# Patient Record
Sex: Male | Born: 1950 | Race: White | Hispanic: No | Marital: Married | Smoking: Never smoker
Health system: Southern US, Community
[De-identification: ages and names within clinical notes are randomized; demographics above are authoritative.]

## PROBLEM LIST (undated history)

## (undated) DIAGNOSIS — L570 Actinic keratosis: Secondary | ICD-10-CM

## (undated) DIAGNOSIS — H269 Unspecified cataract: Secondary | ICD-10-CM

## (undated) DIAGNOSIS — E785 Hyperlipidemia, unspecified: Secondary | ICD-10-CM

## (undated) DIAGNOSIS — F32A Depression, unspecified: Secondary | ICD-10-CM

## (undated) DIAGNOSIS — F329 Major depressive disorder, single episode, unspecified: Secondary | ICD-10-CM

## (undated) DIAGNOSIS — M4690 Unspecified inflammatory spondylopathy, site unspecified: Secondary | ICD-10-CM

## (undated) DIAGNOSIS — M4800 Spinal stenosis, site unspecified: Secondary | ICD-10-CM

## (undated) HISTORY — PX: KNEE SURGERY: SHX244

## (undated) HISTORY — DX: Unspecified inflammatory spondylopathy, site unspecified: M46.90

## (undated) HISTORY — PX: HERNIA REPAIR: SHX51

## (undated) HISTORY — DX: Depression, unspecified: F32.A

## (undated) HISTORY — DX: Actinic keratosis: L57.0

## (undated) HISTORY — DX: Major depressive disorder, single episode, unspecified: F32.9

## (undated) HISTORY — DX: Spinal stenosis, site unspecified: M48.00

## (undated) HISTORY — DX: Hyperlipidemia, unspecified: E78.5

## (undated) HISTORY — DX: Unspecified cataract: H26.9

---
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
## 2000-08-14 ENCOUNTER — Ambulatory Visit (HOSPITAL_COMMUNITY): Admission: RE | Admit: 2000-08-14 | Discharge: 2000-08-14 | Payer: Self-pay | Admitting: Family Medicine

## 2000-08-14 ENCOUNTER — Encounter: Payer: Self-pay | Admitting: Family Medicine

## 2000-10-04 ENCOUNTER — Encounter: Payer: Self-pay | Admitting: Emergency Medicine

## 2000-10-04 ENCOUNTER — Inpatient Hospital Stay (HOSPITAL_COMMUNITY): Admission: EM | Admit: 2000-10-04 | Discharge: 2000-10-06 | Payer: Self-pay | Admitting: Emergency Medicine

## 2000-10-04 ENCOUNTER — Encounter: Payer: Self-pay | Admitting: Internal Medicine

## 2000-10-05 ENCOUNTER — Encounter: Payer: Self-pay | Admitting: Thoracic Surgery (Cardiothoracic Vascular Surgery)

## 2000-10-06 ENCOUNTER — Encounter: Payer: Self-pay | Admitting: Thoracic Surgery (Cardiothoracic Vascular Surgery)

## 2000-10-15 ENCOUNTER — Encounter: Payer: Self-pay | Admitting: Thoracic Surgery (Cardiothoracic Vascular Surgery)

## 2000-10-15 ENCOUNTER — Encounter
Admission: RE | Admit: 2000-10-15 | Discharge: 2000-10-15 | Payer: Self-pay | Admitting: Thoracic Surgery (Cardiothoracic Vascular Surgery)

## 2006-04-24 ENCOUNTER — Ambulatory Visit (HOSPITAL_COMMUNITY): Admission: RE | Admit: 2006-04-24 | Discharge: 2006-04-24 | Payer: Self-pay | Admitting: Gastroenterology

## 2010-02-08 ENCOUNTER — Encounter: Admission: RE | Admit: 2010-02-08 | Discharge: 2010-02-08 | Payer: Self-pay | Admitting: Family Medicine

## 2011-04-05 NOTE — Op Note (Signed)
Paul [name], Paul [name]              ACCOUNT NO.:  [id number]   MEDICAL RECORD NO.:  [id number]          PATIENT TYPE:  AMB   LOCATION:  ENDO                         FACILITY:  [facility]   PHYSICIAN:  John C. Madilyn Fireman, M.D.    DATE OF BIRTH:  Jun 22, 1951   DATE OF PROCEDURE:  04/24/2006  DATE OF DISCHARGE:                                 OPERATIVE REPORT   INDICATIONS FOR PROCEDURE:  Average risk colon cancer screening.   PROCEDURE:  The patient was placed in the left lateral decubitus position  and placed on the pulse monitor with continuous low-flow oxygen delivered by  nasal cannula.  He was sedated with 75 mcg IV fentanyl and 6 mg IV Versed.  Olympus video colonoscope was inserted into the rectum and advanced to  cecum, confirmed by transillumination of McBurney's point and visualization  of ileocecal valve and appendiceal orifice.  The prep was good.  The cecum,  ascending, and transverse colon all appeared normal with no masses, polyps,  diverticula or other mucosal abnormalities.  In the descending sigmoid colon  there was seen several scattered diverticula.  No other abnormalities.  The  rectum likewise appeared normal.  Retroflexed view of the anus revealed no  obvious internal hemorrhoids.  The scope was then withdrawn and the patient  returned to the recovery room in stable condition.  He tolerated the  procedure well and there were no immediate complications.   IMPRESSION:  Diverticulosis, otherwise normal study.   PLAN:  Repeat colonoscopy in 10 years and consider annual Hemoccults  beginning in 5 years.           ______________________________  Everardo All Madilyn Fireman, M.D.     J[facility]/MEDQ  D:  04/24/2006  T:  04/24/2006  Job:  [id number]   cc:   Holley Bouche, M.D.  Fax: [phone]
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
## 2011-07-12 ENCOUNTER — Other Ambulatory Visit: Payer: Self-pay | Admitting: Family Medicine

## 2011-07-12 DIAGNOSIS — R251 Tremor, unspecified: Secondary | ICD-10-CM

## 2011-07-12 DIAGNOSIS — M542 Cervicalgia: Secondary | ICD-10-CM

## 2011-07-12 DIAGNOSIS — R413 Other amnesia: Secondary | ICD-10-CM

## 2011-07-17 ENCOUNTER — Ambulatory Visit
Admission: RE | Admit: 2011-07-17 | Discharge: 2011-07-17 | Disposition: A | Discharge status: Home / Self Care | Payer: 59 | Source: Ambulatory Visit | Attending: Family Medicine | Admitting: Family Medicine

## 2011-07-17 DIAGNOSIS — M542 Cervicalgia: Secondary | ICD-10-CM

## 2011-07-17 DIAGNOSIS — R413 Other amnesia: Secondary | ICD-10-CM

## 2011-07-17 DIAGNOSIS — R251 Tremor, unspecified: Secondary | ICD-10-CM

## 2012-07-01 ENCOUNTER — Encounter (INDEPENDENT_AMBULATORY_CARE_PROVIDER_SITE_OTHER): Payer: Self-pay | Admitting: Surgery

## 2012-07-21 ENCOUNTER — Ambulatory Visit (INDEPENDENT_AMBULATORY_CARE_PROVIDER_SITE_OTHER): Payer: 59 | Admitting: Surgery

## 2012-07-21 ENCOUNTER — Encounter (INDEPENDENT_AMBULATORY_CARE_PROVIDER_SITE_OTHER): Payer: Self-pay | Admitting: Surgery

## 2012-07-21 VITALS — BP 134/82 | HR 60 | Temp 98.0°F | Ht 70.5 in | Wt 179.8 lb

## 2012-07-21 DIAGNOSIS — K429 Umbilical hernia without obstruction or gangrene: Secondary | ICD-10-CM

## 2012-07-21 NOTE — Progress Notes (Signed)
Patient ID: Paul [name], male   DOB: 1951-10-23, 61 y.o.   MRN: [id number]  Chief Complaint  Patient presents with  . Pre-op Exam    eval umb hernia    HPI Paul [name] is a 61 y.o. male.  Referred by Dr. Deatra James for evaluation of umbilical hernia HPI 61 year old male in good health who presents with a six-week history of a palpable bulge at his umbilicus. He first noticed this after lifting some heavy rocks in his garden. The mass has enlarged slightly. It causes minimal discomfort. He remains reducible. He presents now for evaluation for repair.   Past Medical History  Diagnosis Date  . Depression   . Hyperlipidemia   . Spinal stenosis   . Spondylitis     Past Surgical History  Procedure Date  . Appendectomy 1962  . Tonsillectomy 1968  . Knee surgery 1980 & 1982    left    Family History  Problem Relation Age of Onset  . Stroke Mother   . Heart disease Father   . Cancer Sister     bone    Social History History  Substance Use Topics  . Smoking status: Never Smoker   . Smokeless tobacco: Not on file  . Alcohol Use: Yes    Allergies  Allergen Reactions  . Lipitor (Atorvastatin)     Current Outpatient Prescriptions  Medication Sig Dispense Refill  . fish oil-omega-3 fatty acids 1000 MG capsule Take 2 g by mouth daily.      [name] Kitchen MELATONIN ER PO Take by mouth.      . Multiple Vitamins-Minerals (MULTIVITAMIN WITH MINERALS) tablet Take 1 tablet by mouth daily.      . sildenafil (VIAGRA) 100 MG tablet Take 100 mg by mouth daily as needed.      . Testosterone (ANDROGEL TD) Place onto the skin.      [name] Kitchen aspirin (ASPIRIN EC) 81 MG EC tablet Take 81 mg by mouth daily. Swallow whole.        Review of Systems Review of Systems  Constitutional: Negative for fever, chills and unexpected weight change.  HENT: Negative for hearing loss, congestion, sore throat, trouble swallowing and voice change.   Eyes: Negative for visual disturbance.  Respiratory: Negative  for cough and wheezing.   Cardiovascular: Negative for chest pain, palpitations and leg swelling.  Gastrointestinal: Negative for nausea, vomiting, abdominal pain, diarrhea, constipation, blood in stool, abdominal distention, anal bleeding and rectal pain.  Genitourinary: Negative for hematuria and difficulty urinating.  Musculoskeletal: Negative for arthralgias.  Skin: Negative for rash and wound.  Neurological: Negative for seizures, syncope, weakness and headaches.  Hematological: Negative for adenopathy. Does not bruise/bleed easily.  Psychiatric/Behavioral: Negative for confusion.    Blood pressure 134/82, pulse 60, temperature 98 F (36.7 C), temperature source Temporal, height 5' 10.5" (1.791 m), weight 179 lb 12.8 oz (81.557 kg), SpO2 97.00%.  Physical Exam Physical Exam WDWN in NAD HEENT:  EOMI, sclera anicteric Neck:  No masses, no thyromegaly Lungs:  CTA bilaterally; normal respiratory effort CV:  Regular rate and rhythm; no murmurs Abd:  +bowel sounds, soft, non-tender, protruding umbilicus - reducible; 2 cm defect Ext:  Well-perfused; no edema Skin:  Warm, dry; no sign of jaundice  Data Reviewed none  Assessment    Umbilical hernia    Plan    Umbilical hernia repair with mesh.  The surgical procedure has been discussed with the patient.  Potential risks, benefits, alternative treatments, and expected outcomes have been  explained.  All of the patient's questions at this time have been answered.  The likelihood of reaching the patient's treatment goal is good.  The patient understand the proposed surgical procedure and wishes to proceed.        Sophonie Goforth K. 07/21/2012, 2:59 PM

## 2012-09-30 ENCOUNTER — Encounter (HOSPITAL_COMMUNITY): Payer: Self-pay | Admitting: Pharmacist

## 2012-10-02 ENCOUNTER — Encounter (HOSPITAL_COMMUNITY)
Admission: RE | Admit: 2012-10-02 | Discharge: 2012-10-02 | Disposition: A | Discharge status: Home / Self Care | Payer: 59 | Source: Ambulatory Visit | Attending: Surgery | Admitting: Surgery

## 2012-10-02 ENCOUNTER — Encounter (HOSPITAL_COMMUNITY): Payer: Self-pay

## 2012-10-02 LAB — CBC
HCT: 47.6 % (ref 39.0–52.0)
MCV: 91.4 fL (ref 78.0–100.0)
Platelets: 243 10*3/uL (ref 150–400)
RBC: 5.21 MIL/uL (ref 4.22–5.81)
WBC: 5.3 10*3/uL (ref 4.0–10.5)

## 2012-10-02 MED ORDER — CHLORHEXIDINE GLUCONATE 4 % EX LIQD: 1.0000 "application " | Freq: Once | CUTANEOUS | Status: DC

## 2012-10-02 NOTE — Pre-Procedure Instructions (Signed)
PREOP CBC WAS DONE TODAY - AT [facility] AS PER ANESTHESIOLOGIST'S GUIDELINES. 

## 2012-10-02 NOTE — Patient Instructions (Signed)
YOUR SURGERY IS SCHEDULED AT [facility]  ON:  Tuesday  11/19  AT 7:30 AM  REPORT TO [facility] AT:  5:30 AM      PHONE # FOR SHORT STAY IS [phone]  DO NOT EAT OR DRINK ANYTHING AFTER MIDNIGHT THE NIGHT BEFORE YOUR SURGERY.  YOU MAY BRUSH YOUR TEETH, RINSE OUT YOUR MOUTH--BUT NO WATER, NO FOOD, NO CHEWING GUM, NO MINTS, NO CANDIES, NO CHEWING TOBACCO.  PLEASE TAKE THE FOLLOWING MEDICATIONS THE AM OF YOUR SURGERY WITH A FEW SIPS OF WATER:  NO MEDS TO TAKE   IF YOU USE INHALERS--USE YOUR INHALERS THE AM OF YOUR SURGERY AND BRING INHALERS TO THE HOSPITAL -TAKE TO SURGERY.    IF YOU ARE DIABETIC:  DO NOT TAKE ANY DIABETIC MEDICATIONS THE AM OF YOUR SURGERY.  IF YOU TAKE INSULIN IN THE EVENINGS--PLEASE ONLY TAKE 1/2 NORMAL EVENING DOSE THE NIGHT BEFORE YOUR SURGERY.  NO INSULIN THE AM OF YOUR SURGERY.  IF YOU HAVE SLEEP APNEA AND USE CPAP OR BIPAP--PLEASE BRING THE MASK AND THE TUBING.  DO NOT BRING YOUR MACHINE.  DO NOT BRING VALUABLES, MONEY, CREDIT CARDS.  DO NOT WEAR JEWELRY, MAKE-UP, NAIL POLISH AND NO METAL PINS OR CLIPS IN YOUR HAIR. CONTACT LENS, DENTURES / PARTIALS, GLASSES SHOULD NOT BE WORN TO SURGERY AND IN MOST CASES-HEARING AIDS WILL NEED TO BE REMOVED.  BRING YOUR GLASSES CASE, ANY EQUIPMENT NEEDED FOR YOUR CONTACT LENS. FOR PATIENTS ADMITTED TO THE HOSPITAL--CHECK OUT TIME THE DAY OF DISCHARGE IS 11:00 AM.  ALL INPATIENT ROOMS ARE PRIVATE - WITH BATHROOM, TELEPHONE, TELEVISION AND WIFI INTERNET.  IF YOU ARE BEING DISCHARGED THE SAME DAY OF YOUR SURGERY--YOU CAN NOT DRIVE YOURSELF HOME--AND SHOULD NOT GO HOME ALONE BY TAXI OR BUS.  NO DRIVING OR OPERATING MACHINERY FOR 24 HOURS FOLLOWING ANESTHESIA / PAIN MEDICATIONS.  PLEASE MAKE ARRANGEMENTS FOR SOMEONE TO BE WITH YOU AT HOME THE FIRST 24 HOURS AFTER SURGERY. RESPONSIBLE DRIVER'S NAME  WIFE CATHERINE                                               PHONE #  [phone]       PLEASE READ OVER ANY  FACT SHEETS THAT YOU WERE GIVEN: MRSA INFORMATION, BLOOD TRANSFUSION INFORMATION, INCENTIVE SPIROMETER INFORMATION.

## 2012-10-05 NOTE — Anesthesia Preprocedure Evaluation (Addendum)
Anesthesia Evaluation  Patient identified by MRN, date of birth, ID band Patient awake    Reviewed: Allergy & Precautions, H&P , NPO status , Patient's Chart, lab work & pertinent test results  Airway Mallampati: II TM Distance: >3 FB Neck ROM: Full    Dental No notable dental hx. (+) Teeth Intact and Dental Advisory Given   Pulmonary neg pulmonary ROS,  breath sounds clear to auscultation  Pulmonary exam normal       Cardiovascular Exercise Tolerance: Good negative cardio ROS  Rhythm:Regular Rate:Normal     Neuro/Psych Depression Spinal stenosis negative neurological ROS  negative psych ROS   GI/Hepatic negative GI ROS, Neg liver ROS,   Endo/Other  negative endocrine ROS  Renal/GU negative Renal ROS  negative genitourinary   Musculoskeletal negative musculoskeletal ROS (+)   Abdominal   Peds  Hematology negative hematology ROS (+)   Anesthesia Other Findings   Reproductive/Obstetrics negative OB ROS                          Anesthesia Physical Anesthesia Plan  ASA: I  Anesthesia Plan: General   Post-op Pain Management:    Induction: Intravenous  Airway Management Planned: LMA  Additional Equipment:   Intra-op Plan:   Post-operative Plan: Extubation in OR  Informed Consent: I have reviewed the patients History and Physical, chart, labs and discussed the procedure including the risks, benefits and alternatives for the proposed anesthesia with the patient or authorized representative who has indicated his/her understanding and acceptance.   Dental advisory given  Plan Discussed with: CRNA and Surgeon  Anesthesia Plan Comments:        Anesthesia Quick Evaluation

## 2012-10-06 ENCOUNTER — Encounter (HOSPITAL_COMMUNITY): Payer: Self-pay | Admitting: *Deleted

## 2012-10-06 ENCOUNTER — Encounter (HOSPITAL_COMMUNITY): Payer: Self-pay | Admitting: Anesthesiology

## 2012-10-06 ENCOUNTER — Ambulatory Visit (HOSPITAL_COMMUNITY): Payer: 59 | Admitting: Anesthesiology

## 2012-10-06 ENCOUNTER — Encounter (HOSPITAL_COMMUNITY)
Admission: RE | Disposition: A | Discharge status: Home / Self Care | Payer: Self-pay | Source: Ambulatory Visit | Attending: Surgery

## 2012-10-06 ENCOUNTER — Ambulatory Visit (HOSPITAL_COMMUNITY)
Admission: RE | Admit: 2012-10-06 | Discharge: 2012-10-06 | Disposition: A | Discharge status: Home / Self Care | Payer: 59 | Source: Ambulatory Visit | Attending: Surgery | Admitting: Surgery

## 2012-10-06 DIAGNOSIS — Z01812 Encounter for preprocedural laboratory examination: Secondary | ICD-10-CM | POA: Insufficient documentation

## 2012-10-06 DIAGNOSIS — Z7982 Long term (current) use of aspirin: Secondary | ICD-10-CM | POA: Insufficient documentation

## 2012-10-06 DIAGNOSIS — K429 Umbilical hernia without obstruction or gangrene: Secondary | ICD-10-CM

## 2012-10-06 DIAGNOSIS — E785 Hyperlipidemia, unspecified: Secondary | ICD-10-CM | POA: Insufficient documentation

## 2012-10-06 HISTORY — PX: INSERTION OF MESH: SHX5868

## 2012-10-06 HISTORY — PX: UMBILICAL HERNIA REPAIR: SHX196

## 2012-10-06 SURGERY — REPAIR, HERNIA, UMBILICAL, ADULT
Anesthesia: General | Wound class: Clean

## 2012-10-06 MED ORDER — LACTATED RINGERS IV SOLN: INTRAVENOUS | Status: DC

## 2012-10-06 MED ORDER — ACETAMINOPHEN 10 MG/ML IV SOLN
INTRAVENOUS | Status: AC
  Filled 2012-10-06: qty 100

## 2012-10-06 MED ORDER — HYDROMORPHONE HCL PF 1 MG/ML IJ SOLN: 0.2500 mg | INTRAMUSCULAR | Status: DC | PRN

## 2012-10-06 MED ORDER — FENTANYL CITRATE 0.05 MG/ML IJ SOLN
INTRAMUSCULAR | Status: AC
  Filled 2012-10-06: qty 2

## 2012-10-06 MED ORDER — PROPOFOL 10 MG/ML IV BOLUS
INTRAVENOUS | Status: DC | PRN
  Administered 2012-10-06: 200 mg via INTRAVENOUS

## 2012-10-06 MED ORDER — BUPIVACAINE-EPINEPHRINE PF 0.25-1:200000 % IJ SOLN
INTRAMUSCULAR | Status: AC
  Filled 2012-10-06: qty 30

## 2012-10-06 MED ORDER — FENTANYL CITRATE 0.05 MG/ML IJ SOLN
25.0000 ug | INTRAMUSCULAR | Status: DC | PRN
  Administered 2012-10-06 (×2): 50 ug via INTRAVENOUS

## 2012-10-06 MED ORDER — ACETAMINOPHEN 10 MG/ML IV SOLN
INTRAVENOUS | Status: DC | PRN
  Administered 2012-10-06: 1000 mg via INTRAVENOUS

## 2012-10-06 MED ORDER — PROMETHAZINE HCL 25 MG/ML IJ SOLN: 6.2500 mg | INTRAMUSCULAR | Status: DC | PRN

## 2012-10-06 MED ORDER — CEFAZOLIN SODIUM-DEXTROSE 2-3 GM-% IV SOLR
2.0000 g | INTRAVENOUS | Status: AC
  Administered 2012-10-06: 2 g via INTRAVENOUS

## 2012-10-06 MED ORDER — ONDANSETRON HCL 4 MG/2ML IJ SOLN: 4.0000 mg | INTRAMUSCULAR | Status: DC | PRN

## 2012-10-06 MED ORDER — LIDOCAINE HCL (CARDIAC) 20 MG/ML IV SOLN
INTRAVENOUS | Status: DC | PRN
  Administered 2012-10-06: 75 mg via INTRAVENOUS

## 2012-10-06 MED ORDER — ONDANSETRON HCL 4 MG/2ML IJ SOLN
INTRAMUSCULAR | Status: DC | PRN
  Administered 2012-10-06: 4 mg via INTRAVENOUS

## 2012-10-06 MED ORDER — LACTATED RINGERS IV SOLN
INTRAVENOUS | Status: DC | PRN
  Administered 2012-10-06: 07:00:00 via INTRAVENOUS

## 2012-10-06 MED ORDER — BUPIVACAINE-EPINEPHRINE 0.25% -1:200000 IJ SOLN
INTRAMUSCULAR | Status: DC | PRN
  Administered 2012-10-06: 20 mL

## 2012-10-06 MED ORDER — MORPHINE SULFATE 10 MG/ML IJ SOLN: 2.0000 mg | INTRAMUSCULAR | Status: DC | PRN

## 2012-10-06 MED ORDER — CEFAZOLIN SODIUM-DEXTROSE 2-3 GM-% IV SOLR
INTRAVENOUS | Status: AC
  Filled 2012-10-06: qty 50

## 2012-10-06 MED ORDER — OXYCODONE-ACETAMINOPHEN 5-325 MG PO TABS
1.0000 | ORAL_TABLET | ORAL | Status: DC | PRN
  Administered 2012-10-06: 1 via ORAL
  Filled 2012-10-06: qty 1

## 2012-10-06 MED ORDER — OXYCODONE-ACETAMINOPHEN 5-325 MG PO TABS: 1.0000 | ORAL_TABLET | ORAL | Status: DC | PRN

## 2012-10-06 MED ORDER — FENTANYL CITRATE 0.05 MG/ML IJ SOLN
INTRAMUSCULAR | Status: DC | PRN
  Administered 2012-10-06 (×2): 25 ug via INTRAVENOUS
  Administered 2012-10-06: 100 ug via INTRAVENOUS
  Administered 2012-10-06 (×2): 50 ug via INTRAVENOUS

## 2012-10-06 SURGICAL SUPPLY — 39 items
BENZOIN TINCTURE PRP APPL 2/3 (GAUZE/BANDAGES/DRESSINGS) ×2 IMPLANT
BLADE HEX COATED 2.75 (ELECTRODE) ×2 IMPLANT
BLADE SURG 15 STRL LF DISP TIS (BLADE) ×2 IMPLANT
BLADE SURG 15 STRL SS (BLADE) ×2
CLOTH BEACON ORANGE TIMEOUT ST (SAFETY) ×2 IMPLANT
DECANTER SPIKE VIAL GLASS SM (MISCELLANEOUS) ×2 IMPLANT
DRAPE LAPAROTOMY T 102X78X121 (DRAPES) ×2 IMPLANT
DRAPE UTILITY XL STRL (DRAPES) ×2 IMPLANT
DRSG TEGADERM 4X4.75 (GAUZE/BANDAGES/DRESSINGS) ×2 IMPLANT
ELECT REM PT RETURN 9FT ADLT (ELECTROSURGICAL) ×2
ELECTRODE REM PT RTRN 9FT ADLT (ELECTROSURGICAL) ×1 IMPLANT
GAUZE SPONGE 4X4 16PLY XRAY LF (GAUZE/BANDAGES/DRESSINGS) ×2 IMPLANT
GLOVE BIO SURGEON STRL SZ7 (GLOVE) ×2 IMPLANT
GLOVE BIOGEL PI IND STRL 7.0 (GLOVE) IMPLANT
GLOVE BIOGEL PI IND STRL 7.5 (GLOVE) ×1 IMPLANT
GLOVE BIOGEL PI INDICATOR 7.0 (GLOVE)
GLOVE BIOGEL PI INDICATOR 7.5 (GLOVE) ×1
GOWN STRL NON-REIN LRG LVL3 (GOWN DISPOSABLE) ×2 IMPLANT
GOWN STRL REIN XL XLG (GOWN DISPOSABLE) ×2 IMPLANT
KIT BASIN OR (CUSTOM PROCEDURE TRAY) ×2 IMPLANT
NEEDLE HYPO 22GX1.5 SAFETY (NEEDLE) ×2 IMPLANT
NEEDLE HYPO 25X1 1.5 SAFETY (NEEDLE) IMPLANT
NS IRRIG 1000ML POUR BTL (IV SOLUTION) ×2 IMPLANT
PACK BASIC VI WITH GOWN DISP (CUSTOM PROCEDURE TRAY) ×2 IMPLANT
PATCH VENTRAL SMALL 4.3 (Mesh Specialty) ×2 IMPLANT
PENCIL BUTTON HOLSTER BLD 10FT (ELECTRODE) ×2 IMPLANT
SPONGE GAUZE 4X4 12PLY (GAUZE/BANDAGES/DRESSINGS) IMPLANT
SPONGE LAP 4X18 X RAY DECT (DISPOSABLE) ×2 IMPLANT
STRIP CLOSURE SKIN 1/2X4 (GAUZE/BANDAGES/DRESSINGS) ×2 IMPLANT
SUT MNCRL AB 4-0 PS2 18 (SUTURE) ×2 IMPLANT
SUT NOVA NAB DX-16 0-1 5-0 T12 (SUTURE) ×2 IMPLANT
SUT NOVA NAB GS-21 0 18 T12 DT (SUTURE) ×2 IMPLANT
SUT PROLENE 0 CT 1 CR/8 (SUTURE) IMPLANT
SUT PROLENE 0 CT 2 (SUTURE) IMPLANT
SUT VIC AB 3-0 SH 27 (SUTURE) ×1
SUT VIC AB 3-0 SH 27X BRD (SUTURE) IMPLANT
SUT VIC AB 3-0 SH 27XBRD (SUTURE) ×1 IMPLANT
SYR CONTROL 10ML LL (SYRINGE) ×2 IMPLANT
TOWEL OR 17X26 10 PK STRL BLUE (TOWEL DISPOSABLE) ×2 IMPLANT

## 2012-10-06 NOTE — Transfer of Care (Signed)
Immediate Anesthesia Transfer of Care Note  Patient: Paul [name]  Procedure(s) Performed: Procedure(s) (LRB) with comments: HERNIA REPAIR UMBILICAL ADULT (N/A) INSERTION OF MESH (N/A)  Patient Location: PACU  Anesthesia Type:General  Level of Consciousness: awake, alert  and patient cooperative  Airway & Oxygen Therapy: Patient Spontanous Breathing and Patient connected to face mask oxygen  Post-op Assessment: Report given to PACU RN, Post -op Vital signs reviewed and stable and Patient moving all extremities X 4  Post vital signs: Reviewed and stable  Complications: No apparent anesthesia complications

## 2012-10-06 NOTE — H&P (Signed)
Patient presents with   .  Pre-op Exam       eval umb hernia        HPI Paul [name] is a 61 y.o. male.  Referred by Dr. Deatra James for evaluation of umbilical hernia HPI 61 year old male in good health who presents with a six-week history of a palpable bulge at his umbilicus. He first noticed this after lifting some heavy rocks in his garden. The mass has enlarged slightly. It causes minimal discomfort. He remains reducible. He presents now for evaluation for repair.      Past Medical History   Diagnosis  Date   .  Depression     .  Hyperlipidemia     .  Spinal stenosis     .  Spondylitis           Past Surgical History   Procedure  Date   .  Appendectomy  1962   .  Tonsillectomy  1968   .  Knee surgery  1980 & 1982       left         Family History   Problem  Relation  Age of Onset   .  Stroke  Mother     .  Heart disease  Father     .  Cancer  Sister         bone        Social History History   Substance Use Topics   .  Smoking status:  Never Smoker    .  Smokeless tobacco:  Not on file   .  Alcohol Use:  Yes         Allergies   Allergen  Reactions   .  Lipitor (Atorvastatin)           Current Outpatient Prescriptions   Medication  Sig  Dispense  Refill   .  fish oil-omega-3 fatty acids 1000 MG capsule  Take 2 g by mouth daily.         [name] Kitchen  MELATONIN ER PO  Take by mouth.         .  Multiple Vitamins-Minerals (MULTIVITAMIN WITH MINERALS) tablet  Take 1 tablet by mouth daily.         .  sildenafil (VIAGRA) 100 MG tablet  Take 100 mg by mouth daily as needed.         .  Testosterone (ANDROGEL TD)  Place onto the skin.         [name] Kitchen  aspirin (ASPIRIN EC) 81 MG EC tablet  Take 81 mg by mouth daily. Swallow whole.              Review of Systems Review of Systems  Constitutional: Negative for fever, chills and unexpected weight change.  HENT: Negative for hearing loss, congestion, sore throat, trouble swallowing and voice change.   Eyes:  Negative for visual disturbance.  Respiratory: Negative for cough and wheezing.   Cardiovascular: Negative for chest pain, palpitations and leg swelling.  Gastrointestinal: Negative for nausea, vomiting, abdominal pain, diarrhea, constipation, blood in stool, abdominal distention, anal bleeding and rectal pain.  Genitourinary: Negative for hematuria and difficulty urinating.  Musculoskeletal: Negative for arthralgias.  Skin: Negative for rash and wound.  Neurological: Negative for seizures, syncope, weakness and headaches.  Hematological: Negative for adenopathy. Does not bruise/bleed easily.  Psychiatric/Behavioral: Negative for confusion.      Blood pressure 134/82, pulse 60, temperature 98 F (36.7 C), temperature source Temporal, height  5' 10.5" (1.791 m), weight 179 lb 12.8 oz (81.557 kg), SpO2 97.00%.   Physical Exam Physical Exam WDWN in NAD HEENT:  EOMI, sclera anicteric Neck:  No masses, no thyromegaly Lungs:  CTA bilaterally; normal respiratory effort CV:  Regular rate and rhythm; no murmurs Abd:  +bowel sounds, soft, non-tender, protruding umbilicus - reducible; 2 cm defect Ext:  Well-perfused; no edema Skin:  Warm, dry; no sign of jaundice   Data Reviewed none   Assessment    Umbilical hernia     Plan    Umbilical hernia repair with mesh.  The surgical procedure has been discussed with the patient.  Potential risks, benefits, alternative treatments, and expected outcomes have been explained.  All of the patient's questions at this time have been answered.  The likelihood of reaching the patient's treatment goal is good.  The patient understand the proposed surgical procedure and wishes to proceed.      [name]. Corliss Skains, MD, [facility] Surgery  10/06/2012 7:21 AM

## 2012-10-06 NOTE — Op Note (Signed)
Indications:  The patient presented with a history of a reducible umbilical hernia.  The patient was examined and we recommended umbilical hernia repair with mesh.  Pre-operative diagnosis:  Umbilical hernia  Post-operative diagnosis:  Same  Surgeon: Machel Violante K.   Assistants: none  Anesthesia: General LMA anesthesia  ASA Class: 1   Procedure Details  The patient was seen again in the Holding Room. The risks, benefits, complications, treatment options, and expected outcomes were discussed with the patient. The possibilities of reaction to medication, pulmonary aspiration, perforation of viscus, bleeding, recurrent infection, the need for additional procedures, and development of a complication requiring transfusion or further operation were discussed with the patient and/or family. There was concurrence with the proposed plan, and informed consent was obtained. The site of surgery was properly noted/marked. The patient was taken to the Operating Room, identified as Paul [name], and the procedure verified as umbilical hernia repair. A Time Out was held and the above information confirmed.  After an adequate level of general anesthesia was obtained, the patient's abdomen was prepped with Chloraprep and draped in sterile fashion.  We made a transverse incision above the umbilicus.  Dissection was carried down to the hernia sac with cautery.  We dissected bluntly around the hernia sac down to the edge of the fascial defect.  We reduced the hernia sac back into the pre-peritoneal space.  The fascial defect measured 1.5 cm.  We cleared the fascia in all directions.  A small Proceed ventral patch was inserted into the pre-peritoneal space and was deployed.  The mesh was secured with four trans-fascial sutures of 0 Novofil.  The fascial defect was closed with multiple interrupted figure-of-eight 1 Novofil sutures.  The base of the umbilicus was tacked down with 3-0 Vicryl.  3-0 Vicryl was used to  close the subcutaneous tissues and 4-0 Monocryl was used to close the skin.  Steri-strips and clean dressing were applied.  The patient was extubated and brought to the recovery room in stable condition.  All sponge, instrument, and needle counts were correct prior to closure and at the conclusion of the case.   Estimated Blood Loss: Minimal          Complications: None; patient tolerated the procedure well.         Disposition: PACU - hemodynamically stable.         Condition: stable  [name]. Corliss Skains, MD, [facility] Surgery  10/06/2012 8:25 AM

## 2012-10-06 NOTE — Anesthesia Postprocedure Evaluation (Signed)
  Anesthesia Post-op Note  Patient: Paul [name]  Procedure(s) Performed: Procedure(s) (LRB): HERNIA REPAIR UMBILICAL ADULT (N/A) INSERTION OF MESH (N/A)  Patient Location: PACU  Anesthesia Type: General  Level of Consciousness: awake and alert   Airway and Oxygen Therapy: Patient Spontanous Breathing  Post-op Pain: mild  Post-op Assessment: Post-op Vital signs reviewed, Patient's Cardiovascular Status Stable, Respiratory Function Stable, Patent Airway and No signs of Nausea or vomiting  Post-op Vital Signs: stable  Complications: No apparent anesthesia complications

## 2012-10-07 ENCOUNTER — Encounter (HOSPITAL_COMMUNITY): Payer: Self-pay | Admitting: Surgery

## 2012-10-20 ENCOUNTER — Encounter (INDEPENDENT_AMBULATORY_CARE_PROVIDER_SITE_OTHER): Payer: Self-pay | Admitting: Surgery

## 2012-10-20 ENCOUNTER — Ambulatory Visit (INDEPENDENT_AMBULATORY_CARE_PROVIDER_SITE_OTHER): Payer: Commercial Indemnity | Admitting: Surgery

## 2012-10-20 VITALS — BP 134/68 | HR 60 | Temp 97.3°F | Resp 16 | Ht 70.5 in | Wt 170.6 lb

## 2012-10-20 DIAGNOSIS — K429 Umbilical hernia without obstruction or gangrene: Secondary | ICD-10-CM

## 2012-10-20 NOTE — Progress Notes (Signed)
Status post umbilical hernia repair with mesh on 10/06/12 for a 1.5 cm umbilical hernia. We used a 4.3 cm proceed ventral patch. The patient did quite well. No tenderness. His incision is well-healed no sign of infection. No sign of recurrent hernia. He resumed full activity. Followup as needed.  [name]. Corliss Skains, MD, [facility] Surgery  10/20/2012 10:51 AM

## 2013-12-20 ENCOUNTER — Other Ambulatory Visit: Payer: Self-pay | Admitting: Family Medicine

## 2013-12-20 ENCOUNTER — Ambulatory Visit
Admission: RE | Admit: 2013-12-20 | Discharge: 2013-12-20 | Disposition: A | Discharge status: Home / Self Care | Payer: BC Managed Care – PPO | Source: Ambulatory Visit | Attending: Family Medicine | Admitting: Family Medicine

## 2013-12-20 DIAGNOSIS — J40 Bronchitis, not specified as acute or chronic: Secondary | ICD-10-CM

## 2014-03-16 ENCOUNTER — Ambulatory Visit
Admission: RE | Admit: 2014-03-16 | Discharge: 2014-03-16 | Disposition: A | Discharge status: Home / Self Care | Payer: BC Managed Care – PPO | Source: Ambulatory Visit | Attending: Family Medicine | Admitting: Family Medicine

## 2014-03-16 ENCOUNTER — Other Ambulatory Visit: Payer: Self-pay | Admitting: Family Medicine

## 2014-03-16 DIAGNOSIS — R0789 Other chest pain: Secondary | ICD-10-CM

## 2014-09-23 DIAGNOSIS — M9902 Segmental and somatic dysfunction of thoracic region: Secondary | ICD-10-CM | POA: Diagnosis not present

## 2014-09-23 DIAGNOSIS — S39012A Strain of muscle, fascia and tendon of lower back, initial encounter: Secondary | ICD-10-CM | POA: Diagnosis not present

## 2014-09-23 DIAGNOSIS — M9903 Segmental and somatic dysfunction of lumbar region: Secondary | ICD-10-CM | POA: Diagnosis not present

## 2014-09-23 DIAGNOSIS — S29012A Strain of muscle and tendon of back wall of thorax, initial encounter: Secondary | ICD-10-CM | POA: Diagnosis not present

## 2014-09-23 DIAGNOSIS — M9901 Segmental and somatic dysfunction of cervical region: Secondary | ICD-10-CM | POA: Diagnosis not present

## 2014-09-23 DIAGNOSIS — S138XXA Sprain of joints and ligaments of other parts of neck, initial encounter: Secondary | ICD-10-CM | POA: Diagnosis not present

## 2014-09-30 DIAGNOSIS — I4901 Ventricular fibrillation: Secondary | ICD-10-CM | POA: Diagnosis not present

## 2014-09-30 DIAGNOSIS — E291 Testicular hypofunction: Secondary | ICD-10-CM | POA: Diagnosis not present

## 2014-09-30 DIAGNOSIS — F329 Major depressive disorder, single episode, unspecified: Secondary | ICD-10-CM | POA: Diagnosis not present

## 2014-09-30 DIAGNOSIS — E785 Hyperlipidemia, unspecified: Secondary | ICD-10-CM | POA: Diagnosis not present

## 2014-10-04 ENCOUNTER — Other Ambulatory Visit: Payer: Self-pay | Admitting: *Deleted

## 2014-10-04 DIAGNOSIS — R002 Palpitations: Secondary | ICD-10-CM

## 2014-10-11 ENCOUNTER — Encounter: Payer: Self-pay | Admitting: *Deleted

## 2014-10-11 ENCOUNTER — Encounter (INDEPENDENT_AMBULATORY_CARE_PROVIDER_SITE_OTHER): Payer: Medicare Other

## 2014-10-11 DIAGNOSIS — R002 Palpitations: Secondary | ICD-10-CM

## 2014-10-11 NOTE — Progress Notes (Signed)
Patient ID: Paul [name], male   DOB: 11-Nov-1951, 63 y.o.   MRN: [id number] [employer] verite 30 day cardiac event monitor applied to patient.

## 2014-10-19 ENCOUNTER — Telehealth: Payer: Self-pay | Admitting: *Deleted
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
## 2014-10-21 DIAGNOSIS — M9903 Segmental and somatic dysfunction of lumbar region: Secondary | ICD-10-CM | POA: Diagnosis not present

## 2014-10-21 DIAGNOSIS — S29012A Strain of muscle and tendon of back wall of thorax, initial encounter: Secondary | ICD-10-CM | POA: Diagnosis not present

## 2014-10-21 DIAGNOSIS — S138XXA Sprain of joints and ligaments of other parts of neck, initial encounter: Secondary | ICD-10-CM | POA: Diagnosis not present

## 2014-10-21 DIAGNOSIS — S39012A Strain of muscle, fascia and tendon of lower back, initial encounter: Secondary | ICD-10-CM | POA: Diagnosis not present

## 2014-10-21 DIAGNOSIS — M9902 Segmental and somatic dysfunction of thoracic region: Secondary | ICD-10-CM | POA: Diagnosis not present

## 2014-10-21 DIAGNOSIS — M9901 Segmental and somatic dysfunction of cervical region: Secondary | ICD-10-CM | POA: Diagnosis not present

## 2014-10-24 ENCOUNTER — Telehealth: Payer: Self-pay
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
## 2014-12-02 DIAGNOSIS — M9902 Segmental and somatic dysfunction of thoracic region: Secondary | ICD-10-CM | POA: Diagnosis not present

## 2014-12-02 DIAGNOSIS — M9903 Segmental and somatic dysfunction of lumbar region: Secondary | ICD-10-CM | POA: Diagnosis not present

## 2014-12-02 DIAGNOSIS — S138XXA Sprain of joints and ligaments of other parts of neck, initial encounter: Secondary | ICD-10-CM | POA: Diagnosis not present

## 2014-12-02 DIAGNOSIS — S39012A Strain of muscle, fascia and tendon of lower back, initial encounter: Secondary | ICD-10-CM | POA: Diagnosis not present

## 2014-12-02 DIAGNOSIS — M9901 Segmental and somatic dysfunction of cervical region: Secondary | ICD-10-CM | POA: Diagnosis not present

## 2014-12-02 DIAGNOSIS — S29012A Strain of muscle and tendon of back wall of thorax, initial encounter: Secondary | ICD-10-CM | POA: Diagnosis not present

## 2014-12-14 DIAGNOSIS — M791 Myalgia: Secondary | ICD-10-CM | POA: Diagnosis not present

## 2014-12-14 DIAGNOSIS — R1084 Generalized abdominal pain: Secondary | ICD-10-CM | POA: Diagnosis not present

## 2014-12-14 DIAGNOSIS — E780 Pure hypercholesterolemia: Secondary | ICD-10-CM | POA: Diagnosis not present

## 2014-12-14 DIAGNOSIS — R141 Gas pain: Secondary | ICD-10-CM | POA: Diagnosis not present

## 2014-12-30 DIAGNOSIS — S39012A Strain of muscle, fascia and tendon of lower back, initial encounter: Secondary | ICD-10-CM | POA: Diagnosis not present

## 2014-12-30 DIAGNOSIS — S29012A Strain of muscle and tendon of back wall of thorax, initial encounter: Secondary | ICD-10-CM | POA: Diagnosis not present

## 2014-12-30 DIAGNOSIS — S138XXA Sprain of joints and ligaments of other parts of neck, initial encounter: Secondary | ICD-10-CM | POA: Diagnosis not present

## 2014-12-30 DIAGNOSIS — M9901 Segmental and somatic dysfunction of cervical region: Secondary | ICD-10-CM | POA: Diagnosis not present

## 2014-12-30 DIAGNOSIS — M9902 Segmental and somatic dysfunction of thoracic region: Secondary | ICD-10-CM | POA: Diagnosis not present

## 2014-12-30 DIAGNOSIS — M9903 Segmental and somatic dysfunction of lumbar region: Secondary | ICD-10-CM | POA: Diagnosis not present

## 2015-01-05 DIAGNOSIS — E559 Vitamin D deficiency, unspecified: Secondary | ICD-10-CM | POA: Diagnosis not present

## 2015-01-05 DIAGNOSIS — M791 Myalgia: Secondary | ICD-10-CM | POA: Diagnosis not present

## 2015-01-05 DIAGNOSIS — E291 Testicular hypofunction: Secondary | ICD-10-CM | POA: Diagnosis not present

## 2015-01-05 DIAGNOSIS — E780 Pure hypercholesterolemia: Secondary | ICD-10-CM | POA: Diagnosis not present

## 2015-01-05 DIAGNOSIS — D518 Other vitamin B12 deficiency anemias: Secondary | ICD-10-CM | POA: Diagnosis not present

## 2015-01-05 DIAGNOSIS — E889 Metabolic disorder, unspecified: Secondary | ICD-10-CM | POA: Diagnosis not present

## 2015-01-05 DIAGNOSIS — J42 Unspecified chronic bronchitis: Secondary | ICD-10-CM | POA: Diagnosis not present

## 2015-01-06 ENCOUNTER — Other Ambulatory Visit: Payer: Self-pay | Admitting: Family Medicine

## 2015-01-06 DIAGNOSIS — J42 Unspecified chronic bronchitis: Secondary | ICD-10-CM

## 2015-01-11 ENCOUNTER — Ambulatory Visit
Admission: RE | Admit: 2015-01-11 | Discharge: 2015-01-11 | Disposition: A | Discharge status: Home / Self Care | Payer: Medicare Other | Source: Ambulatory Visit | Attending: Family Medicine | Admitting: Family Medicine

## 2015-01-11 DIAGNOSIS — J439 Emphysema, unspecified: Secondary | ICD-10-CM | POA: Diagnosis not present

## 2015-01-11 DIAGNOSIS — I2510 Atherosclerotic heart disease of native coronary artery without angina pectoris: Secondary | ICD-10-CM | POA: Diagnosis not present

## 2015-01-11 DIAGNOSIS — J42 Unspecified chronic bronchitis: Secondary | ICD-10-CM | POA: Diagnosis not present

## 2015-01-11 MED ORDER — IOHEXOL 300 MG/ML  SOLN
75.0000 mL | Freq: Once | INTRAMUSCULAR | Status: AC | PRN
  Administered 2015-01-11: 75 mL via INTRAVENOUS

## 2015-01-25 DIAGNOSIS — M9902 Segmental and somatic dysfunction of thoracic region: Secondary | ICD-10-CM | POA: Diagnosis not present

## 2015-01-25 DIAGNOSIS — S138XXA Sprain of joints and ligaments of other parts of neck, initial encounter: Secondary | ICD-10-CM | POA: Diagnosis not present

## 2015-01-25 DIAGNOSIS — S29012A Strain of muscle and tendon of back wall of thorax, initial encounter: Secondary | ICD-10-CM | POA: Diagnosis not present

## 2015-01-25 DIAGNOSIS — M9901 Segmental and somatic dysfunction of cervical region: Secondary | ICD-10-CM | POA: Diagnosis not present

## 2015-01-25 DIAGNOSIS — S39012A Strain of muscle, fascia and tendon of lower back, initial encounter: Secondary | ICD-10-CM | POA: Diagnosis not present

## 2015-01-25 DIAGNOSIS — M9903 Segmental and somatic dysfunction of lumbar region: Secondary | ICD-10-CM | POA: Diagnosis not present

## 2015-02-22 DIAGNOSIS — S39012A Strain of muscle, fascia and tendon of lower back, initial encounter: Secondary | ICD-10-CM | POA: Diagnosis not present

## 2015-02-22 DIAGNOSIS — M9901 Segmental and somatic dysfunction of cervical region: Secondary | ICD-10-CM | POA: Diagnosis not present

## 2015-02-22 DIAGNOSIS — M9903 Segmental and somatic dysfunction of lumbar region: Secondary | ICD-10-CM | POA: Diagnosis not present

## 2015-02-22 DIAGNOSIS — S138XXA Sprain of joints and ligaments of other parts of neck, initial encounter: Secondary | ICD-10-CM | POA: Diagnosis not present

## 2015-02-22 DIAGNOSIS — S29012A Strain of muscle and tendon of back wall of thorax, initial encounter: Secondary | ICD-10-CM | POA: Diagnosis not present

## 2015-02-22 DIAGNOSIS — M9902 Segmental and somatic dysfunction of thoracic region: Secondary | ICD-10-CM | POA: Diagnosis not present

## 2015-03-23 DIAGNOSIS — S39012A Strain of muscle, fascia and tendon of lower back, initial encounter: Secondary | ICD-10-CM | POA: Diagnosis not present

## 2015-03-23 DIAGNOSIS — M9901 Segmental and somatic dysfunction of cervical region: Secondary | ICD-10-CM | POA: Diagnosis not present

## 2015-03-23 DIAGNOSIS — M9903 Segmental and somatic dysfunction of lumbar region: Secondary | ICD-10-CM | POA: Diagnosis not present

## 2015-03-23 DIAGNOSIS — S138XXA Sprain of joints and ligaments of other parts of neck, initial encounter: Secondary | ICD-10-CM | POA: Diagnosis not present

## 2015-03-23 DIAGNOSIS — M9902 Segmental and somatic dysfunction of thoracic region: Secondary | ICD-10-CM | POA: Diagnosis not present

## 2015-03-23 DIAGNOSIS — S29012A Strain of muscle and tendon of back wall of thorax, initial encounter: Secondary | ICD-10-CM | POA: Diagnosis not present

## 2015-04-18 DIAGNOSIS — S39012A Strain of muscle, fascia and tendon of lower back, initial encounter: Secondary | ICD-10-CM | POA: Diagnosis not present

## 2015-04-18 DIAGNOSIS — M9903 Segmental and somatic dysfunction of lumbar region: Secondary | ICD-10-CM | POA: Diagnosis not present

## 2015-04-18 DIAGNOSIS — M9902 Segmental and somatic dysfunction of thoracic region: Secondary | ICD-10-CM | POA: Diagnosis not present

## 2015-04-18 DIAGNOSIS — M9901 Segmental and somatic dysfunction of cervical region: Secondary | ICD-10-CM | POA: Diagnosis not present

## 2015-04-18 DIAGNOSIS — S138XXA Sprain of joints and ligaments of other parts of neck, initial encounter: Secondary | ICD-10-CM | POA: Diagnosis not present

## 2015-04-18 DIAGNOSIS — S29012A Strain of muscle and tendon of back wall of thorax, initial encounter: Secondary | ICD-10-CM | POA: Diagnosis not present

## 2015-05-16 DIAGNOSIS — M9901 Segmental and somatic dysfunction of cervical region: Secondary | ICD-10-CM | POA: Diagnosis not present

## 2015-05-16 DIAGNOSIS — M9903 Segmental and somatic dysfunction of lumbar region: Secondary | ICD-10-CM | POA: Diagnosis not present

## 2015-05-16 DIAGNOSIS — S138XXA Sprain of joints and ligaments of other parts of neck, initial encounter: Secondary | ICD-10-CM | POA: Diagnosis not present

## 2015-05-16 DIAGNOSIS — S39012A Strain of muscle, fascia and tendon of lower back, initial encounter: Secondary | ICD-10-CM | POA: Diagnosis not present

## 2015-05-16 DIAGNOSIS — S29012A Strain of muscle and tendon of back wall of thorax, initial encounter: Secondary | ICD-10-CM | POA: Diagnosis not present

## 2015-05-16 DIAGNOSIS — M9902 Segmental and somatic dysfunction of thoracic region: Secondary | ICD-10-CM | POA: Diagnosis not present

## 2015-06-22 DIAGNOSIS — S39012A Strain of muscle, fascia and tendon of lower back, initial encounter: Secondary | ICD-10-CM | POA: Diagnosis not present

## 2015-06-22 DIAGNOSIS — M9901 Segmental and somatic dysfunction of cervical region: Secondary | ICD-10-CM | POA: Diagnosis not present

## 2015-06-22 DIAGNOSIS — M9903 Segmental and somatic dysfunction of lumbar region: Secondary | ICD-10-CM | POA: Diagnosis not present

## 2015-06-22 DIAGNOSIS — M9902 Segmental and somatic dysfunction of thoracic region: Secondary | ICD-10-CM | POA: Diagnosis not present

## 2015-06-22 DIAGNOSIS — S138XXA Sprain of joints and ligaments of other parts of neck, initial encounter: Secondary | ICD-10-CM | POA: Diagnosis not present

## 2015-06-22 DIAGNOSIS — S29012A Strain of muscle and tendon of back wall of thorax, initial encounter: Secondary | ICD-10-CM | POA: Diagnosis not present

## 2015-06-26 DIAGNOSIS — E291 Testicular hypofunction: Secondary | ICD-10-CM | POA: Diagnosis not present

## 2015-06-26 DIAGNOSIS — E349 Endocrine disorder, unspecified: Secondary | ICD-10-CM | POA: Diagnosis not present

## 2015-06-26 DIAGNOSIS — J42 Unspecified chronic bronchitis: Secondary | ICD-10-CM | POA: Diagnosis not present

## 2015-06-26 DIAGNOSIS — D518 Other vitamin B12 deficiency anemias: Secondary | ICD-10-CM | POA: Diagnosis not present

## 2015-06-26 DIAGNOSIS — E889 Metabolic disorder, unspecified: Secondary | ICD-10-CM | POA: Diagnosis not present

## 2015-06-26 DIAGNOSIS — E780 Pure hypercholesterolemia: Secondary | ICD-10-CM | POA: Diagnosis not present

## 2015-06-26 DIAGNOSIS — E756 Lipid storage disorder, unspecified: Secondary | ICD-10-CM | POA: Diagnosis not present

## 2015-06-26 DIAGNOSIS — E7211 Homocystinuria: Secondary | ICD-10-CM | POA: Diagnosis not present

## 2015-06-26 DIAGNOSIS — J449 Chronic obstructive pulmonary disease, unspecified: Secondary | ICD-10-CM | POA: Diagnosis not present

## 2015-06-26 DIAGNOSIS — M791 Myalgia: Secondary | ICD-10-CM | POA: Diagnosis not present

## 2015-06-26 DIAGNOSIS — E7212 Methylenetetrahydrofolate reductase deficiency: Secondary | ICD-10-CM | POA: Diagnosis not present

## 2015-07-07 ENCOUNTER — Ambulatory Visit (INDEPENDENT_AMBULATORY_CARE_PROVIDER_SITE_OTHER): Payer: Medicare Other | Admitting: Sports Medicine

## 2015-07-07 ENCOUNTER — Encounter: Payer: Self-pay | Admitting: Sports Medicine

## 2015-07-07 VITALS — BP 122/58 | HR 55 | Ht 71.0 in | Wt 158.0 lb

## 2015-07-07 DIAGNOSIS — E291 Testicular hypofunction: Secondary | ICD-10-CM | POA: Diagnosis not present

## 2015-07-07 DIAGNOSIS — Z125 Encounter for screening for malignant neoplasm of prostate: Secondary | ICD-10-CM | POA: Diagnosis not present

## 2015-07-07 DIAGNOSIS — E780 Pure hypercholesterolemia: Secondary | ICD-10-CM | POA: Diagnosis not present

## 2015-07-07 DIAGNOSIS — S86812A Strain of other muscle(s) and tendon(s) at lower leg level, left leg, initial encounter: Secondary | ICD-10-CM | POA: Diagnosis present

## 2015-07-07 NOTE — Progress Notes (Signed)
   Subjective:    Patient ID: Paul [name], male    DOB: Nov 29, 1950, 64 y.o.   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 Pain is present primarily with running. He describes a pulling sensation that will sometimes occur in the posterior knee and at other times in his calf. He will modify his running until his symptoms improve but he has been unable to increase his tempo without reinjuring himself. He denies any swelling. Denies bruising. He has tried some kinesio tape which has been helpful. He is hoping to participate in a half Ironman in March. He has no pain with biking or swimming.  Past medical history reviewed Past surgical history reviewed. Significant for 2 prior left knee arthroscopies. Medications reviewed Allergies reviewed    Review of Systems     Objective:   Physical Exam Well-developed, fit appearing. No acute distress. Awake alert and oriented 3  Left calf: There is some slight tenderness to palpation along the lateral head of the gastrocnemius. No palpable defect. No soft tissue swelling. No other bony or soft tissue tenderness to direct palpation.  Fairly well-preserved arch with standing. Excellent running form. Forefoot striker striking slightly in supination. No limp.  Limited ultrasound of the left calf was performed. I do not see any obvious tearing. There is some slight calcification diffusely in the lateral head of the gastroc consistent with chronic injury.       Assessment & Plan:  Left calf pain secondary to chronic calf strain  Green insoles with a 5/16 inch lift to try in his running shoes. Alfredson heel drop exercises to be done daily. Body helix calf compression sleeve. Follow-up in one month. We discussed the possibility of giving him a compression sleeve for his left knee if he finds  the calf sleeve to be helpful. He can continue with activity as tolerated.

## 2015-07-18 ENCOUNTER — Ambulatory Visit (INDEPENDENT_AMBULATORY_CARE_PROVIDER_SITE_OTHER): Payer: Medicare Other | Admitting: Sports Medicine

## 2015-07-18 ENCOUNTER — Encounter: Payer: Self-pay | Admitting: Sports Medicine

## 2015-07-18 VITALS — BP 133/68 | Ht 71.0 in | Wt 158.0 lb

## 2015-07-18 DIAGNOSIS — M79662 Pain in left lower leg: Secondary | ICD-10-CM | POA: Diagnosis present

## 2015-07-18 MED ORDER — NITROGLYCERIN 0.2 MG/HR TD PT24: MEDICATED_PATCH | TRANSDERMAL | Status: DC

## 2015-07-18 NOTE — Progress Notes (Signed)
Paul [name] - 64 y.o. male MRN [id number]  Date of birth: [birth year]   Paul [name] is a 64 y.o. male who presents today for left posterior calf pain. He was given compression sleeve, green insoles, Alfredson's exercises, and heel wedge added upon last visit.   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  He was training last Thursday with 400 m runs and felt discomfort in his posterior lower leg.  He was doing running high knees (skips) on Sunday and has severe pain. He denies any popping or traumatic event. He was limping for the rest of Sunday. He hasn't done anything for the past two days.  Pain is described as tightening up like a cramp. Felt better when he was walking on his heel.  He had pain with his knee bent performing the Alfredson's exercises. He locates the pain while pointing to the lateral head of his gastrocnemius but reports that the pain feels deep.   PMHx -  reviewed.   PSHx - reviewed.  Contributory factors include:  2 left knee arthroscopies.  FHx -  reviewed. Medications - niacin, sildenafil,    ROS Per HPI   Exam:  Filed Vitals:   07/18/15 1155  BP: 133/68   Gen: NAD Cardiorespiratory - Normal respiratory effort/rate.   Posterior Lower leg Exam:  Laterality: left Appearance: no erythema or ecchymosis  Edema: none  Tenderness: TTP along the lateral aspect of the lateral head of the gastrocnemius at the mid belly  Range of Motion: normal active and passive  Strength:  Quadricep: 5/5 Hamstring: 5/5 Plantarflexion: 5/5 Dorsiflexion: 5/5 Gait: normal Neurovascularly intact   Right foot dominant  Well preserved arch with standing  Right calf: 35.5 cm Left calf: 36.5 Left leg: 88 cm  Right Leg: 89 cm   Imaging:  Limited US:  Left leg: calf  Lateral and medial gastrocnemius appear intact with no effusion or tear. There was some effusion deep but not exactly correlating to the area of significant pain upon palpation.  Achilles with normal thickness and no effusion. Achilles with normal findings upon insertion into calcaneous. Findings are consistent with normal appearing gastrocnemius   Pain of left calf History suggestive for a soleus strain.  Ultrasound not indicating a tear or effusion.  - NTG protocol initiated. He was counseled on not taking sildenafil and NTG together  - withold from running and Alfredson's exercises for now. He can continue to bike and swim   - he will f/u with Dr. Micheline Chapman at a time with Dr. Oneida Alar in clinic in order to ultrasound    Patient seen and evaluated with the resident. I agree with the plan of care. This is somewhat of a challenging case. Physical exam and history suggest a potential soleus muscle strain but he does not have any palpable defect or soft tissue swelling today. Ultrasound evaluation did not show any obvious abnormality either. I've asked that he refrain from doing his Alfredson heel drop exercises as it sounds like these may have exacerbated his symptoms (especially with the knee bent). I want him to continue with his heel lifts and continue with his compression sleeve. I would also like to start the patient on a quarter patch of nitroglycerin. Although I do not see a discrete tear on ultrasound I would like to try some topical nitroglycerin. He is counseled about not using the patch with his sildenafil. Begin to resume activity as tolerated and follow-up with  Dr. Oneida Alar on September 13 for his input.

## 2015-07-18 NOTE — Assessment & Plan Note (Signed)
History suggestive for a soleus strain.  Ultrasound not indicating a tear or effusion.  - NTG protocol initiated. He was counseled on not taking sildenafil and NTG together  - withold from running and Alfredson's exercises for now. He can continue to bike and swim   - he will f/u with Dr. Micheline Chapman at a time with Dr. Oneida Alar in clinic in order to ultrasound

## 2015-07-18 NOTE — Patient Instructions (Signed)

## 2015-07-20 DIAGNOSIS — M9903 Segmental and somatic dysfunction of lumbar region: Secondary | ICD-10-CM | POA: Diagnosis not present

## 2015-07-20 DIAGNOSIS — S138XXA Sprain of joints and ligaments of other parts of neck, initial encounter: Secondary | ICD-10-CM | POA: Diagnosis not present

## 2015-07-20 DIAGNOSIS — M9902 Segmental and somatic dysfunction of thoracic region: Secondary | ICD-10-CM | POA: Diagnosis not present

## 2015-07-20 DIAGNOSIS — M9901 Segmental and somatic dysfunction of cervical region: Secondary | ICD-10-CM | POA: Diagnosis not present

## 2015-07-20 DIAGNOSIS — S29012A Strain of muscle and tendon of back wall of thorax, initial encounter: Secondary | ICD-10-CM | POA: Diagnosis not present

## 2015-07-20 DIAGNOSIS — S39012A Strain of muscle, fascia and tendon of lower back, initial encounter: Secondary | ICD-10-CM | POA: Diagnosis not present

## 2015-08-01 ENCOUNTER — Encounter: Payer: Self-pay | Admitting: Sports Medicine

## 2015-08-01 ENCOUNTER — Ambulatory Visit (INDEPENDENT_AMBULATORY_CARE_PROVIDER_SITE_OTHER): Payer: Medicare Other | Admitting: Sports Medicine

## 2015-08-01 VITALS — BP 149/75 | HR 56 | Ht 71.0 in | Wt 158.0 lb

## 2015-08-01 DIAGNOSIS — M79662 Pain in left lower leg: Secondary | ICD-10-CM | POA: Diagnosis present

## 2015-08-01 NOTE — Progress Notes (Signed)
Patient ID: SKYLER CAREL, male   DOB: [birth year], 64 y.o.   MRN: [id number]  HPI: Mr. Tae Vonada is a 64 year old male presenting for follow-up for suspected soleus strain (last seen 07/18/15). He is an avid cyclist and frequent runner.  In the interim he has attempted walking with calf compression sleeve during which he experienced sharp, intense lateral calf pain that felt like a cramp, of which eventually resolved over the next few days. He experienced a similar pain with a later return to running at slower pace, experiencing pain around 2.5 miles.  Pickle juice did not resolve pain.  Ran/walked 5 miles this morning with similar pain pattern.  Experiences pain with soleus-isolated Alfredson's exercises.  Does not have pain with walking.  Using NG patch since last visit with little improvement, also using compression sleeve.  Denies weakness, paraesthesias, swelling.  Still unsure of inciting injury or activity.  Filed Vitals:   08/01/15 1341  BP: 149/75  Pulse: 56   Physical exam: General: Fit male in 60s sitting comfortably on exam table in no apparent distress.  Lower extremities: No swelling or ecchymoses. NG patch present over mid-inferior gastrocnemius.  Ttp over lateral/inferior aspect of gastrocnemius. 5/5 strength with plantar-/dorsiflexion/inversion/eversion of ankle.  Limited [facility] ultrasound (left calf):  ~2 x 0.5 cm oval-shaped scar tissue present in the lateral gastrocnemius, surrounding a vein at junction of lateral gastroc and soleus; mild amount of edema and blood flow surrounding the scar tissue. Dilated vessels w varicose change deep in calf.  Running gait: Forefoot striker, forward aligned.  Assessment/Plan:  Mr. Draylen Lobue is a 64 year old male presenting for follow-up for suspected soleus strain with  ~2 x 0.5 cm [name] oval-shaped scar tissue present in the lateral gastrocnemius surrounding a vessel at junction of lateral gastroc and soleus with a mild amount of edema  and blood flow.  Coupled with the patient's discomfort during activity, suggests that his pain may be secondary to the injury of varicose veins and their penetrating vessels vs a soleus injury (patient reported pain with knee flexion and foot dorsiflexion).  Suspect patient will respond well to conservative therapy as discussed below.  - Continue NG protocol, explained to patient may not expect to see results until a few months from now - Continue compression and heel lift during exercise - Vitamin C 500 mg qd and ASA 81 mg for collagen formation and clot risk reduction, respectively - Resume Alfredson's exercises and usual training regimen with pain as guide - Follow-up 4-6 weeks or unless needed in the interim  Bland Span, MS4  Supervised and examined - I agree with documentation.  Ila Mcgill, MD

## 2015-08-01 NOTE — Assessment & Plan Note (Signed)
This has made some improvement More tearing at vasuclar perforators and no large mm tear Keep up current plan  Reck 6 wks

## 2015-08-03 ENCOUNTER — Encounter: Payer: Self-pay | Admitting: Sports Medicine

## 2015-08-04 ENCOUNTER — Other Ambulatory Visit: Payer: Self-pay | Admitting: *Deleted

## 2015-08-04 DIAGNOSIS — S39012A Strain of muscle, fascia and tendon of lower back, initial encounter: Secondary | ICD-10-CM | POA: Diagnosis not present

## 2015-08-04 DIAGNOSIS — S138XXA Sprain of joints and ligaments of other parts of neck, initial encounter: Secondary | ICD-10-CM | POA: Diagnosis not present

## 2015-08-04 DIAGNOSIS — M9901 Segmental and somatic dysfunction of cervical region: Secondary | ICD-10-CM | POA: Diagnosis not present

## 2015-08-04 DIAGNOSIS — M9903 Segmental and somatic dysfunction of lumbar region: Secondary | ICD-10-CM | POA: Diagnosis not present

## 2015-08-04 DIAGNOSIS — S29012A Strain of muscle and tendon of back wall of thorax, initial encounter: Secondary | ICD-10-CM | POA: Diagnosis not present

## 2015-08-04 DIAGNOSIS — M79662 Pain in left lower leg: Secondary | ICD-10-CM

## 2015-08-04 DIAGNOSIS — M9902 Segmental and somatic dysfunction of thoracic region: Secondary | ICD-10-CM | POA: Diagnosis not present

## 2015-08-07 ENCOUNTER — Ambulatory Visit: Payer: Medicare Other | Admitting: Sports Medicine

## 2015-08-10 ENCOUNTER — Ambulatory Visit: Payer: Medicare Other | Attending: Sports Medicine | Admitting: Physical Therapy

## 2015-08-10 DIAGNOSIS — M79662 Pain in left lower leg: Secondary | ICD-10-CM

## 2015-08-10 NOTE — Therapy (Signed)
[facility] [place], [place], [address] Phone: [phone]   Fax:  [phone]  Physical Therapy Evaluation  Patient Details  Name: Paul [name] MRN: [id number] Date of Birth: 1951/08/31 Referring Provider:  Stefanie Libel, MD  Encounter Date: 08/10/2015      PT End of Session - 08/10/15 1239    Visit Number 1   Number of Visits 12   Date for PT Re-Evaluation 09/21/15   Authorization Type Medicare, Progress note by 8th or 9th visit, KXmodifier by 15 visit   PT Start Time 0930   PT Stop Time 1015   PT Time Calculation (min) 45 min   Activity Tolerance Patient tolerated treatment well   Behavior During Therapy [facility] for tasks assessed/performed      Past Medical History  Diagnosis Date  . Hyperlipidemia   . Spinal stenosis     WAS HAVING NECK PAIN -PT STATES NERVE ABLATION PROCEDURE 2012 AND PAIN HAS RESOLVED  . Spondylitis   . Depression     RESOLVED    Past Surgical History  Procedure Laterality Date  . Appendectomy  1962  . Tonsillectomy  1968  . Knee surgery  [place]    left  . Umbilical hernia repair  10/06/2012    Procedure: HERNIA REPAIR UMBILICAL ADULT;  Surgeon: Imogene Burn. Georgette Dover, MD;  Location: [facility];  Service: General;  Laterality: N/A;  . Insertion of mesh  10/06/2012    Procedure: INSERTION OF MESH;  Surgeon: Imogene Burn. Georgette Dover, MD;  Location: [facility];  Service: General;  Laterality: N/A;  . Hernia repair      There were no vitals filed for this visit.  Visit Diagnosis:  Calf pain, left - Plan: PT plan of care cert/re-cert      Subjective Assessment - 08/10/15 0947    Subjective pt is a 64 y.o M with CC of Left calf pain that started on June 09, 2015. pt reports that he has a hx of it occurring. the pain in the left calf started non-traumatic and gradually ramped. He reports the pain will fluctuate starting after 2 1/2 mile run.  and reported that his R calf has started aching,   since onset pt reports its stayed same since onset.    Limitations --  prolonged running   How long can you sit comfortably? unlimited   How long can you stand comfortably? unlimited   How long can you walk comfortably? unlimited   Diagnostic tests [place] Varicose Veins and scar tissue.    Patient Stated Goals to be able to do his 1/2 iron man in 6 months, and to be pain free.    Currently in Pain? Yes   Pain Score 0-No pain  when stomping the foot. 3/10,  with running 9/10   Pain Location Calf   Pain Orientation Left   Pain Descriptors / Indicators Aching;Sore   Pain Type Chronic pain   Pain Onset More than a month ago   Pain Frequency Intermittent   Aggravating Factors  prolonged running,    Pain Relieving Factors compression stocking, ice, TENs unit            [facility] PT Assessment - 08/10/15 0956    Assessment   Medical Diagnosis Left calf pain   Onset Date/Surgical Date 06/09/15   Hand Dominance Right   Next MD Visit 03/10/2015   Prior Therapy no   Precautions   Precautions None   Restrictions   Weight  Bearing Restrictions No   Balance Screen   Has the patient fallen in the past 6 months No   Has the patient had a decrease in activity level because of a fear of falling?  No   Is the patient reluctant to leave their home because of a fear of falling?  No   Home Environment   Living Environment Private residence   Living Arrangements Spouse/significant other   Available Help at Discharge Available 24 hours/day;Available PRN/intermittently   Type of Home House   Home Access Stairs to enter   Entrance Stairs-Number of Steps 3   Entrance Stairs-Rails Can reach both   Home Layout Two level   Alternate Level Stairs-Number of Steps 12   Prior Function   Level of Independence Independent;Independent with basic ADLs   Vocation Retired   Leisure running, cycling, swimming, movies, hanging out with familty, coaching, and travel   Cognition   Overall Cognitive Status Within  Functional Limits for tasks assessed   ROM / Strength   AROM / PROM / Strength AROM;PROM;Strength   AROM   AROM Assessment Site Knee;Ankle   Right/Left Knee Right;Left   Right/Left Ankle Right;Left   Right Ankle Dorsiflexion 5   Right Ankle Plantar Flexion 45   Right Ankle Inversion 28   Right Ankle Eversion 12   Left Ankle Dorsiflexion 4   Left Ankle Plantar Flexion 45   Left Ankle Inversion 28   Left Ankle Eversion 12   PROM   Overall PROM  Within functional limits for tasks performed   PROM Assessment Site Knee;Ankle   Right/Left Knee Left;Right   Right/Left Ankle Left;Right   Strength   Strength Assessment Site Knee;Ankle   Right/Left Knee Right;Left   Right Knee Flexion 5/5   Right Knee Extension 5/5   Left Knee Flexion 5/5   Left Knee Extension 5/5   Right/Left Ankle Right;Left   Right Ankle Dorsiflexion 5/5   Right Ankle Plantar Flexion 5/5   Right Ankle Inversion 5/5   Right Ankle Eversion 5/5   Left Ankle Dorsiflexion 5/5   Left Ankle Plantar Flexion 5/5   Left Ankle Inversion 5/5   Left Ankle Eversion 5/5   Palpation   Palpation comment tenderness at the bil posterial muscles at origin along the medial tigbai.    Ambulation/Gait   Gait Pattern Step-through pattern  mild dropping of arch during marching                           PT Education - 08/10/15 1239    Education provided Yes   Education Details evaluation findings, POC, goals, HEP, dry needling education   Person(s) Educated Patient   Methods Explanation   Comprehension Verbalized understanding          PT Short Term Goals - 08/10/15 1244    PT SHORT TERM GOAL #1   Title pt will be I with inital HEP (08/31/2015)   Time 3   Period Weeks   Status New   PT SHORT TERM GOAL #2   Title pt will be able to verbalize and demonstrate techniques to reduce L/R calf pain and inflammation via stretching, and RICE (08/31/2015)   Time 3   Period Weeks   Status New            PT Long Term Goals - 08/10/15 1246    PT LONG TERM GOAL #1   Title pt will be I with all HEP given  throughout therapy (09/21/2015)   Time 6   Period Weeks   Status New   PT LONG TERM GOAL #2   Title pt will demonstrate little to no dropping of the arch during prolonged running and stomping activities to decrease pain and straining in the calf (09/21/2015)   Time 6   Period Weeks   Status New   PT LONG TERM GOAL #3   Title pt will demonstrate no tightness or tenderness in the posterior tibialis to promote <2/10 pain during and following running for > 3-4 miles (09/21/2015)   Time 6   Period Weeks   Status New               Plan - 08/10/15 1240    Clinical Impression Statement Mr. Paschal reports to OPPT with CC of L calf pain that has been going on since 06/09/2015. He deonstrates normal ankle AROM and strength with no pain upon testing. He has a hx of calf pain in the L leg and has now noticed pain in the R calf with tendnerness in the same place. Palpation revealed tendnerness located along the posterior tibialis with intemrittent radiation of pain down the leg into the foot. He reports feeling the pain the most with stomping and after running for 2-3 miles.  He does exhibit mild arch drop during static marching. He would benefit from physical therapy to decrease his pain and return him to his PLOF by addressing the impairments listed.    Pt will benefit from skilled therapeutic intervention in order to improve on the following deficits Pain;Improper body mechanics;Postural dysfunction;Decreased strength;Decreased activity tolerance;Decreased endurance   Rehab Potential Good   PT Frequency 2x / week   PT Duration 6 weeks   PT Treatment/Interventions ADLs/Self Care Home Management;Cryotherapy;Electrical Stimulation;Iontophoresis 4mg /ml Dexamethasone;Moist Heat;Ultrasound;Therapeutic activities;Therapeutic exercise;Manual techniques;Taping;Dry needling;Passive range of  motion;Patient/family education   PT Next Visit Plan assess response to HEP, strengthing to help support arch, posterior tib stretching, dry needling, Modalities PRN   PT Home Exercise Plan towel scrunches, posterior tibalis strenghtneing, calf stretch   Consulted and Agree with Plan of Care Patient         Problem List Patient Active Problem List   Diagnosis Date Noted  . Pain of left calf 07/18/2015  . Umbilical hernia 17/51/0258   Starr Lake PT, DPT, LAT, ATC  08/10/2015  12:56 PM      [facility] [address] [place], [place], [address] Phone: [phone]   Fax:  [phone]

## 2015-08-10 NOTE — Patient Instructions (Addendum)
   Kristoffer Leamon PT, DPT, LAT, ATC  [facility] Phone: [phone]       Trigger Point Dry Needling  . What is Trigger Point Dry Needling (DN)? o DN is a physical therapy technique used to treat muscle pain and dysfunction. Specifically, DN helps deactivate muscle trigger points (muscle knots).  o A thin filiform needle is used to penetrate the skin and stimulate the underlying trigger point. The goal is for a local twitch response (LTR) to occur and for the trigger point to relax. No medication of any kind is injected during the procedure.   . What Does Trigger Point Dry Needling Feel Like?  o The procedure feels different for each individual patient. Some patients report that they do not actually feel the needle enter the skin and overall the process is not painful. Very mild bleeding may occur. However, many patients feel a deep cramping in the muscle in which the needle was inserted. This is the local twitch response.   . How Will I feel after the treatment? o Soreness is normal, and the onset of soreness may not occur for a few hours. Typically this soreness does not last longer than two days.  o Bruising is uncommon, however; ice can be used to decrease any possible bruising.  o In rare cases feeling tired or nauseous after the treatment is normal. In addition, your symptoms may get worse before they get better, this period will typically not last longer than 24 hours.   . What Can I do After My Treatment? o Increase your hydration by drinking more water for the next 24 hours. o You may place ice or heat on the areas treated that have become sore, however, do not use heat on inflamed or bruised areas. Heat often brings more relief post needling. o You can continue your regular activities, but vigorous activity is not recommended initially after the treatment for 24 hours. o DN is best combined with other physical therapy such as strengthening,  stretching, and other therapies.   

## 2015-08-11 ENCOUNTER — Ambulatory Visit: Payer: Medicare Other | Admitting: Physical Therapy

## 2015-08-11 DIAGNOSIS — M79662 Pain in left lower leg: Secondary | ICD-10-CM

## 2015-08-11 NOTE — Therapy (Signed)
[place], [place], [address] Phone: [phone]   Fax:  [phone]  Physical Therapy Treatment  Patient Details  Name: Paul [name] MRN: [id number] Date of Birth: 05-09-1951 Referring Provider:  Shirline Frees, MD  Encounter Date: 08/11/2015      PT End of Session - 08/11/15 1129    Visit Number 2   Number of Visits 12   Date for PT Re-Evaluation 09/21/15   Authorization Type Medicare, Progress note by 8th or 9th visit, KXmodifier by 15 visit   PT Start Time 1010   PT Stop Time 1115   PT Time Calculation (min) 65 min   Activity Tolerance Patient tolerated treatment well      Past Medical History  Diagnosis Date  . Hyperlipidemia   . Spinal stenosis     WAS HAVING NECK PAIN -PT STATES NERVE ABLATION PROCEDURE 2012 AND PAIN HAS RESOLVED  . Spondylitis   . Depression     RESOLVED    Past Surgical History  Procedure Laterality Date  . Appendectomy  1962  . Tonsillectomy  1968  . Knee surgery  [place]    left  . Umbilical hernia repair  10/06/2012    Procedure: HERNIA REPAIR UMBILICAL ADULT;  Surgeon: Imogene Burn. Georgette Dover, MD;  Location: [facility];  Service: General;  Laterality: N/A;  . Insertion of mesh  10/06/2012    Procedure: INSERTION OF MESH;  Surgeon: Imogene Burn. Georgette Dover, MD;  Location: [facility];  Service: General;  Laterality: N/A;  . Hernia repair      There were no vitals filed for this visit.  Visit Diagnosis:  Calf pain, left      Subjective Assessment - 08/11/15 1008    Subjective Patient returns after initial eval yesterday.  Both sides affected.  With right leg hurting on Saturday with running.     Currently in Pain? Yes   Aggravating Factors  20 min running, swimming and kicking hard; the pounding of pavement    Pain Relieving Factors orthotics;  compression sleeve, ice            [facility] PT Assessment - 08/10/15 0956    Assessment   Medical Diagnosis Left calf pain    Onset Date/Surgical Date 06/09/15   Hand Dominance Right   Next MD Visit 03/10/2015   Prior Therapy no   Precautions   Precautions None   Restrictions   Weight Bearing Restrictions No   Balance Screen   Has the patient fallen in the past 6 months No   Has the patient had a decrease in activity level because of a fear of falling?  No   Is the patient reluctant to leave their home because of a fear of falling?  No   Home Environment   Living Environment Private residence   Living Arrangements Spouse/significant other   Available Help at Discharge Available 24 hours/day;Available PRN/intermittently   Type of Home House   Home Access Stairs to enter   Entrance Stairs-Number of Steps 3   Entrance Stairs-Rails Can reach both   Home Layout Two level   Alternate Level Stairs-Number of Steps 12   Prior Function   Level of Independence Independent;Independent with basic ADLs   Vocation Retired   Leisure running, cycling, swimming, movies, hanging out with familty, coaching, and travel   Cognition   Overall Cognitive Status Within Functional Limits for tasks assessed   ROM / Strength   AROM / PROM /  Strength AROM;PROM;Strength   AROM   AROM Assessment Site Knee;Ankle   Right/Left Knee Right;Left   Right/Left Ankle Right;Left   Right Ankle Dorsiflexion 5   Right Ankle Plantar Flexion 45   Right Ankle Inversion 28   Right Ankle Eversion 12   Left Ankle Dorsiflexion 4   Left Ankle Plantar Flexion 45   Left Ankle Inversion 28   Left Ankle Eversion 12   PROM   Overall PROM  Within functional limits for tasks performed   PROM Assessment Site Knee;Ankle   Right/Left Knee Left;Right   Right/Left Ankle Left;Right   Strength   Strength Assessment Site Knee;Ankle   Right/Left Knee Right;Left   Right Knee Flexion 5/5   Right Knee Extension 5/5   Left Knee Flexion 5/5   Left Knee Extension 5/5   Right/Left Ankle Right;Left   Right Ankle Dorsiflexion 5/5   Right Ankle Plantar Flexion  5/5   Right Ankle Inversion 5/5   Right Ankle Eversion 5/5   Left Ankle Dorsiflexion 5/5   Left Ankle Plantar Flexion 5/5   Left Ankle Inversion 5/5   Left Ankle Eversion 5/5   Palpation   Palpation comment tenderness at the bil posterial muscles at origin along the medial tigbai.    Ambulation/Gait   Gait Pattern Step-through pattern  mild dropping of arch during marching                     [facility] Adult PT Treatment/Exercise - 08/11/15 1124    Knee/Hip Exercises: Standing   Other Standing Knee Exercises Box heel lifts and eccentric lowers   Knee/Hip Exercises: Seated   Other Seated Knee/Hip Exercises Sole to sole isometrics 10x   Other Seated Knee/Hip Exercises eccentric lowers with band inversion exercise from yesterday   Moist Heat Therapy   Number Minutes Moist Heat 10 Minutes   Moist Heat Location Ankle   Manual Therapy   Manual Therapy Myofascial release;Soft tissue mobilization   Soft tissue mobilization posterior tibialis; medial and lateral gastroc, soleus   Myofascial Release Graston technique G4 instrument assisted B gastroc/soleus          Trigger Point Dry Needling - 08/11/15 1128    Consent Given? Yes   Education Handout Provided Yes   Muscles Treated Lower Body Gastrocnemius  B posterior tibials, medial gastroc soleus with twitch      Performed Bilaterally        PT Education - 08/11/15 1129    Education provided Yes   Education Details HEP progression; self care after dry needling; compression sleeve recommendation for both extremities   Person(s) Educated Patient   Methods Explanation;Handout;Demonstration   Comprehension Verbalized understanding;Returned demonstration          PT Short Term Goals - 08/11/15 1135    PT SHORT TERM GOAL #1   Title pt will be I with inital HEP (08/31/2015)   Time 3   Period Weeks   Status Partially Met   PT SHORT TERM GOAL #2   Title pt will be able to verbalize and demonstrate techniques to  reduce L/R calf pain and inflammation via stretching, and RICE (08/31/2015)   Time 3   Period Weeks   Status Partially Met           PT Long Term Goals - 08/11/15 1136    PT LONG TERM GOAL #1   Title pt will be I with all HEP given throughout therapy (09/21/2015)   Time 6   Period  Weeks   Status On-going   PT LONG TERM GOAL #2   Title pt will demonstrate little to no dropping of the arch during prolonged running and stomping activities to decrease pain and straining in the calf (09/21/2015)   Time 6   Period Weeks   Status On-going   PT LONG TERM GOAL #3   Title pt will demonstrate no tightness or tenderness in the posterior tibialis to promote <2/10 pain during and following running for > 3-4 miles (09/21/2015)   Time 6   Period Weeks   Status On-going               Plan - 08/11/15 1131    Clinical Impression Statement The patient seems highy compliant with HEP and self care so far.  He continues to cycle, swim and run in preparation for his 1/2 Iron Man in March in [place].   Multiple tender points noted in right posterior tibialis, medial gastroc B and soleus.  Improved muscle length following dry needling and manual interventions.  Therapist closely monitoring response throughout treatment session.     PT Next Visit Plan assess response to dry needling and continue as needed; manual techniques; add band eversion to HEP and review HEP as needed;  BAPS; gastrocnemius stretch;  heel raises and eccentric lowers          G-Codes - 2015-08-13 1302    Functional Assessment Tool Used clinical judgment   Functional Limitation Mobility: Walking and moving around   Mobility: Walking and Moving Around Current Status ([id number]) At least 20 percent but less than 40 percent impaired, limited or restricted   Mobility: Walking and Moving Around Goal Status [phone]) At least 1 percent but less than 20 percent impaired, limited or restricted      Problem List Patient Active Problem  List   Diagnosis Date Noted  . Pain of left calf 07/18/2015  . Umbilical hernia 74/45/1460    [name] 08/11/2015, 11:39 AM  [facility] [address] [place], [place], [address] Phone: [phone]   Fax:  [phone]   Ruben Im, PT 08/11/2015 11:40 AM Phone: [phone] Fax: [phone]

## 2015-08-11 NOTE — Patient Instructions (Signed)
Sole to sole isometrics 25x 4x/day   Controlled eccentrics with band exercise;   Box heel raises and lowers each direction (on or off the step)

## 2015-08-12 ENCOUNTER — Encounter: Payer: Self-pay | Admitting: Physical Therapy

## 2015-08-14 ENCOUNTER — Ambulatory Visit: Payer: Medicare Other | Admitting: Physical Therapy

## 2015-08-14 DIAGNOSIS — M79662 Pain in left lower leg: Secondary | ICD-10-CM

## 2015-08-14 NOTE — Therapy (Signed)
[place], [place], [address] Phone: [phone]   Fax:  [phone]  Physical Therapy Treatment  Patient Details  Name: Paul [name] MRN: [id number] Date of Birth: [birth year] Referring Provider:  Shirline Frees, MD  Encounter Date: 08/14/2015      PT End of Session - 08/14/15 1816    Visit Number 3   Number of Visits 12   Date for PT Re-Evaluation 09/21/15   Authorization Type Medicare, Progress note by 8th or 9th visit, KXmodifier by 15 visit   PT Start Time 0432   PT Stop Time 0525   PT Time Calculation (min) 53 min   Activity Tolerance Patient tolerated treatment well   Behavior During Therapy [facility] for tasks assessed/performed      Past Medical History  Diagnosis Date  . Hyperlipidemia   . Spinal stenosis     WAS HAVING NECK PAIN -PT STATES NERVE ABLATION PROCEDURE 2012 AND PAIN HAS RESOLVED  . Spondylitis   . Depression     RESOLVED    Past Surgical History  Procedure Laterality Date  . Appendectomy  1962  . Tonsillectomy  1968  . Knee surgery  [place]    left  . Umbilical hernia repair  10/06/2012    Procedure: HERNIA REPAIR UMBILICAL ADULT;  Surgeon: Imogene Burn. Georgette Dover, MD;  Location: [facility];  Service: General;  Laterality: N/A;  . Insertion of mesh  10/06/2012    Procedure: INSERTION OF MESH;  Surgeon: Imogene Burn. Georgette Dover, MD;  Location: [facility];  Service: General;  Laterality: N/A;  . Hernia repair      There were no vitals filed for this visit.  Visit Diagnosis:  Calf pain, left      Subjective Assessment - 08/14/15 1638    Subjective Pt ran 2 1/2 miles.  I did not feel left leg at all.  but I felt Right calf pain tightness   Currently in Pain? Yes   Pain Score 2    Pain Location Calf   Pain Orientation Left   Pain Descriptors / Indicators Aching;Sore   Multiple Pain Sites Yes   Pain Score 7   Pain Location Calf   Pain Orientation Right   Pain Descriptors /  Indicators Aching   Aggravating Factors  running,  and walking                         [facility] Adult PT Treatment/Exercise - 08/14/15 1809    Knee/Hip Exercises: Standing   Other Standing Knee Exercises review of gastroc and soleus stretches bilatearlly   Moist Heat Therapy   Number Minutes Moist Heat 10 Minutes   Moist Heat Location Ankle  bil calf mx   Manual Therapy   Manual Therapy Myofascial release;Soft tissue mobilization   Soft tissue mobilization posterior tibialis; medial and lateral gastroc, soleus   Myofascial Release IASTYM tools gastroc soleus bil          Trigger Point Dry Needling - 08/14/15 1642    Consent Given? Yes   Education Handout Provided No  previously given   Muscles Treated Lower Body Gastrocnemius;Soleus  Post tibialis   Gastrocnemius Response Twitch response elicited;Palpable increased muscle length              PT Education - 08/14/15 1812    Education provided Yes   Education Details self care after dry needling , need for rest post needling and no running.  review of gastroc and soleus stretch   Person(s) Educated Patient   Methods Explanation;Demonstration;Verbal cues   Comprehension Verbalized understanding;Returned demonstration          PT Short Term Goals - 08/11/15 1135    PT SHORT TERM GOAL #1   Title pt will be I with inital HEP (08/31/2015)   Time 3   Period Weeks   Status Partially Met   PT SHORT TERM GOAL #2   Title pt will be able to verbalize and demonstrate techniques to reduce L/R calf pain and inflammation via stretching, and RICE (08/31/2015)   Time 3   Period Weeks   Status Partially Met           PT Long Term Goals - 08/11/15 1136    PT LONG TERM GOAL #1   Title pt will be I with all HEP given throughout therapy (09/21/2015)   Time 6   Period Weeks   Status On-going   PT LONG TERM GOAL #2   Title pt will demonstrate little to no dropping of the arch during prolonged running and  stomping activities to decrease pain and straining in the calf (09/21/2015)   Time 6   Period Weeks   Status On-going   PT LONG TERM GOAL #3   Title pt will demonstrate no tightness or tenderness in the posterior tibialis to promote <2/10 pain during and following running for > 3-4 miles (09/21/2015)   Time 6   Period Weeks   Status On-going               Plan - 08/14/15 1725    Clinical Impression Statement Pt reports that left gastroc/soleus markedly improved after dry needling and he looks for ward to trigger point dry needling of R gastroc/soleus.  Pt reviewed importance of rest and stretching post dry needling and use of heat.  Pt has noted he was able to run 2 1/2 miles without left  calf pain this past weekend.    PT Next Visit Plan assess response to dry needling and continue as needed; manual techniques; add band eversion to HEP and review HEP as needed;  BAPS; gastrocnemius stretch;  heel raises and eccentric lowers   PT Home Exercise Plan continue original HEP with emphasisi on gastroc stretch bil and Graston tools.   Consulted and Agree with Plan of Care Patient        Problem List Patient Active Problem List   Diagnosis Date Noted  . Pain of left calf 07/18/2015  . Umbilical hernia 32/76/1470   Voncille Lo, PT 08/14/2015 6:18 PM Phone: [phone] Fax: [facility] [address] [place], [place], [address] Phone: [phone]   Fax:  [phone]

## 2015-08-18 DIAGNOSIS — S138XXA Sprain of joints and ligaments of other parts of neck, initial encounter: Secondary | ICD-10-CM | POA: Diagnosis not present

## 2015-08-18 DIAGNOSIS — M9902 Segmental and somatic dysfunction of thoracic region: Secondary | ICD-10-CM | POA: Diagnosis not present

## 2015-08-18 DIAGNOSIS — M9903 Segmental and somatic dysfunction of lumbar region: Secondary | ICD-10-CM | POA: Diagnosis not present

## 2015-08-18 DIAGNOSIS — S29012A Strain of muscle and tendon of back wall of thorax, initial encounter: Secondary | ICD-10-CM | POA: Diagnosis not present

## 2015-08-18 DIAGNOSIS — M9901 Segmental and somatic dysfunction of cervical region: Secondary | ICD-10-CM | POA: Diagnosis not present

## 2015-08-18 DIAGNOSIS — S39012A Strain of muscle, fascia and tendon of lower back, initial encounter: Secondary | ICD-10-CM | POA: Diagnosis not present

## 2015-08-23 DIAGNOSIS — Z23 Encounter for immunization: Secondary | ICD-10-CM | POA: Diagnosis not present

## 2015-08-24 ENCOUNTER — Ambulatory Visit: Payer: Medicare Other | Attending: Sports Medicine | Admitting: Physical Therapy

## 2015-08-24 DIAGNOSIS — M79662 Pain in left lower leg: Secondary | ICD-10-CM | POA: Diagnosis not present

## 2015-08-24 NOTE — Therapy (Signed)
[place], [place], [address] Phone: [phone]   Fax:  [phone]  Physical Therapy Treatment  Patient Details  Name: Paul [name] MRN: [id number] Date of Birth: 1951-06-04 Referring Provider:  Shirline Frees, MD  Encounter Date: 08/24/2015      PT End of Session - 08/24/15 0844    Visit Number 4   Number of Visits 12   Date for PT Re-Evaluation 09/21/15   Authorization Type Medicare, Progress note by 8th or 9th visit, KXmodifier by 15 visit   PT Start Time 0802   PT Stop Time 0855   PT Time Calculation (min) 53 min   Activity Tolerance Patient tolerated treatment well      Past Medical History  Diagnosis Date  . Hyperlipidemia   . Spinal stenosis     WAS HAVING NECK PAIN -PT STATES NERVE ABLATION PROCEDURE 2012 AND PAIN HAS RESOLVED  . Spondylitis   . Depression     RESOLVED    Past Surgical History  Procedure Laterality Date  . Appendectomy  1962  . Tonsillectomy  1968  . Knee surgery  [place]    left  . Umbilical hernia repair  10/06/2012    Procedure: HERNIA REPAIR UMBILICAL ADULT;  Surgeon: Imogene Burn. Georgette Dover, MD;  Location: [facility];  Service: General;  Laterality: N/A;  . Insertion of mesh  10/06/2012    Procedure: INSERTION OF MESH;  Surgeon: Imogene Burn. Georgette Dover, MD;  Location: [facility];  Service: General;  Laterality: N/A;  . Hernia repair      There were no vitals filed for this visit.  Visit Diagnosis:  Calf pain, left      Subjective Assessment - 08/24/15 0803    Subjective Walk and ran 3 miles this morning with both side calf pain--left medial gastroc and tightness right lateral gastroc tightness    Currently in Pain? Yes   Pain Score 2    Pain Location Calf   Pain Orientation Right;Left   Pain Type Chronic pain   Aggravating Factors  20 min/walk run   Pain Relieving Factors compression sleeve; dry needling                         [facility] Adult  PT Treatment/Exercise - 08/24/15 0841    Moist Heat Therapy   Number Minutes Moist Heat 13 Minutes   Moist Heat Location Ankle  Bilateral gastroc   Manual Therapy   Manual Therapy Soft tissue mobilization;Myofascial release   Soft tissue mobilization B gastroc, soleus   Myofascial Release Graston technique instrument assisted myofascial G4 to B medial, lateral gastroc,distal HS and soleus muscles          Trigger Point Dry Needling - 08/24/15 0843    Consent Given? Yes   Muscles Treated Lower Body Gastrocnemius;Soleus   Gastrocnemius Response Twitch response elicited;Palpable increased muscle length   Soleus Response Twitch response elicited;Palpable increased muscle length      Performed bilaterally.            PT Short Term Goals - 08/24/15 0849    PT SHORT TERM GOAL #1   Title pt will be I with inital HEP (08/31/2015)   Time 3   Period Weeks   Status Achieved   PT SHORT TERM GOAL #2   Title pt will be able to verbalize and demonstrate techniques to reduce L/R calf pain and inflammation via stretching, and RICE (08/31/2015)  Status Achieved           PT Long Term Goals - 08/24/15 0849    PT LONG TERM GOAL #1   Title pt will be I with all HEP given throughout therapy (09/21/2015)   Time 6   Period Weeks   Status On-going   PT LONG TERM GOAL #2   Title pt will demonstrate little to no dropping of the arch during prolonged running and stomping activities to decrease pain and straining in the calf (09/21/2015)   Time 6   Period Weeks   Status On-going   PT LONG TERM GOAL #3   Title pt will demonstrate no tightness or tenderness in the posterior tibialis to promote <2/10 pain during and following running for > 3-4 miles (09/21/2015)   Time 6   Period Weeks   Status On-going               Plan - 08/24/15 0845    Clinical Impression Statement Multiple trigger points in Bilateral medial gastroc R=L.  Multiple twitch responses produced with dry needling  and manual interventions which is a good prognostic indicator.  He continues to be compliant with home stretching and strengthening as previously instructed.  He is familiar with Kinesiotaping and plans to try to tape helpself before the next run.  Continue with treatment plan including up to 3 more sessions of dry needling in addition to visits for therapeutic exercise for optimal long term benefit.  All STGs met.     PT Next Visit Plan assess response to dry needling and continue as needed; manual techniques; add band eversion to HEP and review HEP as needed;  BAPS; gastrocnemius stretch;  heel raises and eccentric lowers        Problem List Patient Active Problem List   Diagnosis Date Noted  . Pain of left calf 07/18/2015  . Umbilical hernia 28/90/2284    Paul [name] 08/24/2015, 8:50 AM  [facility] [address] [place], [place], [address] Phone: [phone]   Fax:  [phone]   Paul [name], PT 08/24/2015 8:51 AM Phone: [phone] Fax: [phone]

## 2015-08-25 ENCOUNTER — Ambulatory Visit: Payer: Medicare Other | Admitting: Physical Therapy

## 2015-08-30 ENCOUNTER — Ambulatory Visit: Payer: Medicare Other | Admitting: Physical Therapy

## 2015-08-30 DIAGNOSIS — M79662 Pain in left lower leg: Secondary | ICD-10-CM

## 2015-08-30 NOTE — Patient Instructions (Addendum)
ANKLE: Eversion, Bilateral (Band)    Place band around feet. Keeping heels on floor, raise toes of both feet up and away from body. Do not move hips. Hold __5_ seconds. Use __green______ band. __10_ reps per set, __3_ sets per ,7 ___ days per week  Copyright  VHI. All rights reserved.Heel Raise: Bilateral (Standing)    Rise on balls of feet. Place weight on left and lower slowly. Repeat __10__ times per set. Do _2___ sets per session. Do 2____ sessions per day.  http://orth.exer.us/38   Copyright  VHI. All rights reserved.   Also 4 way ankle with theraband

## 2015-08-30 NOTE — Therapy (Signed)
[place], [place], [address] Phone: [phone]   Fax:  [phone]  Physical Therapy Treatment  Patient Details  Name: Paul [name] MRN: [id number] Date of Birth: March 16, 1951 Referring Provider:  Shirline Frees, MD  Encounter Date: 08/30/2015      PT End of Session - 08/30/15 1508    Visit Number 5   Number of Visits 12   Date for PT Re-Evaluation 09/21/15   Authorization Type Medicare, Progress note by 8th or 9th visit, KXmodifier by 15 visit   PT Start Time 0217   PT Stop Time 0300   PT Time Calculation (min) 43 min      Past Medical History  Diagnosis Date  . Hyperlipidemia   . Spinal stenosis     WAS HAVING NECK PAIN -PT STATES NERVE ABLATION PROCEDURE 2012 AND PAIN HAS RESOLVED  . Spondylitis   . Depression     RESOLVED    Past Surgical History  Procedure Laterality Date  . Appendectomy  1962  . Tonsillectomy  1968  . Knee surgery  [place]    left  . Umbilical hernia repair  10/06/2012    Procedure: HERNIA REPAIR UMBILICAL ADULT;  Surgeon: Imogene Burn. Georgette Dover, MD;  Location: [facility];  Service: General;  Laterality: N/A;  . Insertion of mesh  10/06/2012    Procedure: INSERTION OF MESH;  Surgeon: Imogene Burn. Georgette Dover, MD;  Location: [facility];  Service: General;  Laterality: N/A;  . Hernia repair      There were no vitals filed for this visit.  Visit Diagnosis:  No diagnosis found.      Subjective Assessment - 08/30/15 1420    Subjective Ran 1 mile and the left calf was hurting.    Currently in Pain? Yes   Pain Score 3    Pain Location Calf   Pain Orientation Left   Pain Descriptors / Indicators Sore                         [facility] Adult PT Treatment/Exercise - 08/30/15 0001    Knee/Hip Exercises: Seated   Other Seated Knee/Hip Exercises seated green band eversionx 20 each- weakness left vs right   Ankle Exercises: Stretches   Soleus Stretch 3 reps;20  seconds   Gastroc Stretch 3 reps;30 seconds   Ankle Exercises: Seated   Other Seated Ankle Exercises eversion green band -x 20 each more weakness left   Ankle Exercises: Standing   BAPS Standing;Level 2   BAPS Limitations circles each way as well as DF/PF bilateral   SLS 4 way hip with SLS and red band x 10 each way bilateral   Other Standing Ankle Exercises Box heel lifts and eccentric lowers x 10 left                 PT Education - 08/30/15 1456    Education provided Yes   Education Details 4 way hip theraband red, eccentric lowering heel raise on left, eversion green band   Person(s) Educated Patient   Methods Explanation;Handout   Comprehension Verbalized understanding          PT Short Term Goals - 08/24/15 0849    PT SHORT TERM GOAL #1   Title pt will be I with inital HEP (08/31/2015)   Time 3   Period Weeks   Status Achieved   PT SHORT TERM GOAL #2   Title pt will be able  to verbalize and demonstrate techniques to reduce L/R calf pain and inflammation via stretching, and RICE (08/31/2015)   Status Achieved           PT Long Term Goals - 08/24/15 0849    PT LONG TERM GOAL #1   Title pt will be I with all HEP given throughout therapy (09/21/2015)   Time 6   Period Weeks   Status On-going   PT LONG TERM GOAL #2   Title pt will demonstrate little to no dropping of the arch during prolonged running and stomping activities to decrease pain and straining in the calf (09/21/2015)   Time 6   Period Weeks   Status On-going   PT LONG TERM GOAL #3   Title pt will demonstrate no tightness or tenderness in the posterior tibialis to promote <2/10 pain during and following running for > 3-4 miles (09/21/2015)   Time 6   Period Weeks   Status On-going               Plan - 08/30/15 1502    Clinical Impression Statement Pt reports increased pain in left calf after running 1 mile. Instructed pt in green band ankle eversion per previous plan, as well as heel  raises with eccentric lowering focusing on left. Instructed pt in SLS with rebounder toss with pt reporting more difficulty and fatigue on left. Pt given red band 4 way hip for HEP bilateral.    PT Next Visit Plan dry needling, encourage therex to assess deficits prior to return to running; review new HEP        Problem List Patient Active Problem List   Diagnosis Date Noted  . Pain of left calf 07/18/2015  . Umbilical hernia 17/79/3903    Dorene Ar, PTA 08/30/2015, 3:21 PM  [facility] [address] [place], [place], [address] Phone: [phone]   Fax:  [phone]

## 2015-09-01 ENCOUNTER — Ambulatory Visit: Payer: Medicare Other | Admitting: Physical Therapy

## 2015-09-01 DIAGNOSIS — M79662 Pain in left lower leg: Secondary | ICD-10-CM | POA: Diagnosis not present

## 2015-09-01 NOTE — Therapy (Signed)
Paul [name], [place], [address] Phone: [phone]   Fax:  [phone]  Physical Therapy Treatment  Patient Details  Name: Paul [name] MRN: [id number] Date of Birth: 07/19/1951 No Data Recorded  Encounter Date: 09/01/2015      PT End of Session - 09/01/15 1201    Visit Number 6   Number of Visits 12   Date for PT Re-Evaluation 09/21/15   Authorization Type Medicare, Progress note by 8th or 9th visit, KXmodifier by 15 visit   PT Start Time 1015   PT Stop Time 1115   PT Time Calculation (min) 60 min   Activity Tolerance Patient tolerated treatment well   Behavior During Therapy [facility] for tasks assessed/performed      Past Medical History  Diagnosis Date  . Hyperlipidemia   . Spinal stenosis     WAS HAVING NECK PAIN -PT STATES NERVE ABLATION PROCEDURE 2012 AND PAIN HAS RESOLVED  . Spondylitis   . Depression     RESOLVED    Past Surgical History  Procedure Laterality Date  . Appendectomy  1962  . Tonsillectomy  1968  . Knee surgery  [place]    left  . Umbilical hernia repair  10/06/2012    Procedure: HERNIA REPAIR UMBILICAL ADULT;  Surgeon: Imogene Burn. Georgette Dover, MD;  Location: [facility];  Service: General;  Laterality: N/A;  . Insertion of mesh  10/06/2012    Procedure: INSERTION OF MESH;  Surgeon: Imogene Burn. Georgette Dover, MD;  Location: [facility];  Service: General;  Laterality: N/A;  . Hernia repair      There were no vitals filed for this visit.  Visit Diagnosis:  Calf pain, left      Subjective Assessment - 09/01/15 1018    Subjective "The Right calf is feeling really good but I haven't really tested it, The left calf is still sore"    Currently in Pain? Yes   Pain Score 1    Pain Location Calf   Pain Orientation Left   Pain Descriptors / Indicators Sore   Pain Type Chronic pain   Pain Onset More than a month ago   Pain Frequency Intermittent   Aggravating Factors  running   Pain  Relieving Factors comprssion sleeve; dry needling   Pain Score 0   Pain Location Calf   Pain Orientation Right                         [facility] Adult PT Treatment/Exercise - 09/01/15 0001    Knee/Hip Exercises: Aerobic   Stationary Bike L4 x 5 min   Knee/Hip Exercises: Standing   Heel Raises 1 set;15 reps   Moist Heat Therapy   Number Minutes Moist Heat 10 Minutes   Moist Heat Location Ankle  L Calf, in supine   Manual Therapy   Myofascial Release instrument assisted STM over gastronemisu/soleus complex, folllowing DN  with foot pushed into DF for added stretch   Ankle Exercises: Stretches   Soleus Stretch 3 reps;20 seconds   Gastroc Stretch 2 reps;30 seconds   Ankle Exercises: Seated   Other Seated Ankle Exercises eversion green band -x 20 each more weakness left          Trigger Point Dry Needling - 09/01/15 1057    Consent Given? Yes   Education Handout Provided No   Muscles Treated Lower Body Gastrocnemius;Soleus  posterior tibialis   Gastrocnemius Response Twitch response elicited;Palpable increased  muscle length   Soleus Response Twitch response elicited;Palpable increased muscle length              PT Education - 09/01/15 1201    Education provided Yes   Education Details HEP review   Person(s) Educated Patient   Methods Explanation   Comprehension Verbalized understanding          PT Short Term Goals - 09/01/15 1205    PT SHORT TERM GOAL #1   Title pt will be I with inital HEP (08/31/2015)   Time 3   Period Weeks   Status Achieved   PT SHORT TERM GOAL #2   Title pt will be able to verbalize and demonstrate techniques to reduce L/R calf pain and inflammation via stretching, and RICE (08/31/2015)   Time 3   Period Weeks   Status Achieved           PT Long Term Goals - 09/01/15 1205    PT LONG TERM GOAL #1   Title pt will be I with all HEP given throughout therapy (09/21/2015)   Time 6   Period Weeks   Status On-going    PT LONG TERM GOAL #2   Title pt will demonstrate little to no dropping of the arch during prolonged running and stomping activities to decrease pain and straining in the calf (09/21/2015)   Time 6   Period Weeks   Status On-going   PT LONG TERM GOAL #3   Title pt will demonstrate no tightness or tenderness in the posterior tibialis to promote <2/10 pain during and following running for > 3-4 miles (09/21/2015)   Time 6   Period Weeks   Status On-going               Plan - 09/01/15 1201    Clinical Impression Statement [name] states that the R calf is feeling better but the L is feeling sore today. Multiple trigger points were treated in the Gastroc/soleus with multiple twitch response were elicited as well as in the posterior tibialis. He reported relief  of tension and felt better with instrument assisted STM.  pt reports planning not running for the next 2 weeks to allow it to heal and see how he feels after seeing dr. Oneida Alar.    PT Next Visit Plan dry needling, encourage therex to assess deficits prior to return to running   Consulted and Agree with Plan of Care Patient        Problem List Patient Active Problem List   Diagnosis Date Noted  . Pain of left calf 07/18/2015  . Umbilical hernia 97/53/0051   Paul [name] PT, DPT, LAT, ATC  09/01/2015  12:12 PM      [facility] [address] [place], [place], [address] Phone: [phone]   Fax:  [phone]  Name: Paul [name] MRN: [id number] Date of Birth: [birth year]

## 2015-09-06 ENCOUNTER — Ambulatory Visit: Payer: Medicare Other | Admitting: Physical Therapy

## 2015-09-06 DIAGNOSIS — M79662 Pain in left lower leg: Secondary | ICD-10-CM

## 2015-09-06 NOTE — Therapy (Signed)
[place], [place], [address] Phone: [phone]   Fax:  [phone]  Physical Therapy Treatment  Patient Details  Name: Paul [name] MRN: [id number] Date of Birth: Jan 21, 1951 No Data Recorded  Encounter Date: 09/06/2015      PT End of Session - 09/06/15 1423    Visit Number 7   Number of Visits 12   Date for PT Re-Evaluation 09/21/15   Authorization Type Medicare, Progress note by 8th or 9th visit, KXmodifier by 15 visit   PT Start Time 0218   PT Stop Time 0256   PT Time Calculation (min) 38 min      Past Medical History  Diagnosis Date  . Hyperlipidemia   . Spinal stenosis     WAS HAVING NECK PAIN -PT STATES NERVE ABLATION PROCEDURE 2012 AND PAIN HAS RESOLVED  . Spondylitis   . Depression     RESOLVED    Past Surgical History  Procedure Laterality Date  . Appendectomy  1962  . Tonsillectomy  1968  . Knee surgery  [place]    left  . Umbilical hernia repair  10/06/2012    Procedure: HERNIA REPAIR UMBILICAL ADULT;  Surgeon: Imogene Burn. Georgette Dover, MD;  Location: [facility];  Service: General;  Laterality: N/A;  . Insertion of mesh  10/06/2012    Procedure: INSERTION OF MESH;  Surgeon: Imogene Burn. Georgette Dover, MD;  Location: [facility];  Service: General;  Laterality: N/A;  . Hernia repair      There were no vitals filed for this visit.  Visit Diagnosis:  Calf pain, left      Subjective Assessment - 09/06/15 1423    Subjective No pain. Just a tender spot in left calf   Currently in Pain? No/denies                         [facility] Adult PT Treatment/Exercise - 09/06/15 0001    Knee/Hip Exercises: Standing   Heel Raises 1 set;15 reps  with single leg lowering   Knee/Hip Exercises: Seated   Other Seated Knee/Hip Exercises seated green band eversionx 20 each- weakness left vs right   Ankle Exercises: Standing   BAPS Standing;Level 4   BAPS Limitations circles each way as well as  DF/PF bilateral  tactile cues for right ankle to decrease compensation   SLS 4 way hip with SLS and red band x 10 each way bilateral  added foam pad   Rebounder right more difficult -multiple tosses   Heel Raises 20 reps;5 seconds  slow eccentric lowering   Heel Walk (Round Trip) 29ft   Other Standing Ankle Exercises Box heel lifts and eccentric lowers x 10 left    Ankle Exercises: Stretches   Soleus Stretch 3 reps;20 seconds  cues for correct form   Gastroc Stretch 2 reps;30 seconds  step   Ankle Exercises: Seated   Other Seated Ankle Exercises eversion green band -x 20 each more weakness left                  PT Short Term Goals - 09/01/15 1205    PT SHORT TERM GOAL #1   Title pt will be I with inital HEP (08/31/2015)   Time 3   Period Weeks   Status Achieved   PT SHORT TERM GOAL #2   Title pt will be able to verbalize and demonstrate techniques to reduce L/R calf pain and inflammation via stretching,  and RICE (08/31/2015)   Time 3   Period Weeks   Status Achieved           PT Long Term Goals - 09/01/15 1205    PT LONG TERM GOAL #1   Title pt will be I with all HEP given throughout therapy (09/21/2015)   Time 6   Period Weeks   Status On-going   PT LONG TERM GOAL #2   Title pt will demonstrate little to no dropping of the arch during prolonged running and stomping activities to decrease pain and straining in the calf (09/21/2015)   Time 6   Period Weeks   Status On-going   PT LONG TERM GOAL #3   Title pt will demonstrate no tightness or tenderness in the posterior tibialis to promote <2/10 pain during and following running for > 3-4 miles (09/21/2015)   Time 6   Period Weeks   Status On-going               Plan - 09/06/15 1512    Clinical Impression Statement Pt reports pain in calf has not returned with walking. He has not jogged. He sees MD next week. He feels a little tenderness in left gastroc upon palpation only. Progressed pt exercises to  foam pad with 4 way hip exercises with only mild difficulty. Reviewed eccentric calf exercises and soleus stretch. No increased pain with therex today.    PT Next Visit Plan dry needling, encourage therex to assess deficits prior to return to running; note to Dr fields?        Problem List Patient Active Problem List   Diagnosis Date Noted  . Pain of left calf 07/18/2015  . Umbilical hernia 75/79/7282    Dorene Ar, PTA 09/06/2015, 3:15 PM  [facility] [address] [place], [place], [address] Phone: [phone]   Fax:  [phone]  Name: Paul [name] MRN: [id number] Date of Birth: [birth year]

## 2015-09-08 ENCOUNTER — Ambulatory Visit: Payer: Medicare Other | Admitting: Physical Therapy

## 2015-09-08 DIAGNOSIS — M79662 Pain in left lower leg: Secondary | ICD-10-CM

## 2015-09-08 NOTE — Therapy (Addendum)
[place], [place], [address] Phone: [phone]   Fax:  [phone]  Physical Therapy Treatment  Patient Details  Name: NICHOLOUS GIRGENTI MRN: [id number] Date of Birth: January 09, 1951 No Data Recorded  Encounter Date: 09/08/2015      PT End of Session - 09/08/15 1040    Visit Number 8   Number of Visits 12   Date for PT Re-Evaluation 09/21/15   Authorization Type Medicare, Progress note by 8th or 9th visit, KXmodifier by 15 visit   PT Start Time 1019   PT Stop Time 1103   PT Time Calculation (min) 44 min      Past Medical History  Diagnosis Date  . Hyperlipidemia   . Spinal stenosis     WAS HAVING NECK PAIN -PT STATES NERVE ABLATION PROCEDURE 2012 AND PAIN HAS RESOLVED  . Spondylitis   . Depression     RESOLVED    Past Surgical History  Procedure Laterality Date  . Appendectomy  1962  . Tonsillectomy  1968  . Knee surgery  [place]    left  . Umbilical hernia repair  10/06/2012    Procedure: HERNIA REPAIR UMBILICAL ADULT;  Surgeon: Imogene Burn. Georgette Dover, MD;  Location: [facility];  Service: General;  Laterality: N/A;  . Insertion of mesh  10/06/2012    Procedure: INSERTION OF MESH;  Surgeon: Imogene Burn. Georgette Dover, MD;  Location: [facility];  Service: General;  Laterality: N/A;  . Hernia repair      There were no vitals filed for this visit.  Visit Diagnosis:  Calf pain, left          [facility] PT Assessment - 09/08/15 1056    AROM   Right Ankle Dorsiflexion 5   Right Ankle Plantar Flexion 45   Right Ankle Inversion 28   Right Ankle Eversion 12   Left Ankle Dorsiflexion 4   Left Ankle Plantar Flexion 45   Left Ankle Inversion 28   Left Ankle Eversion 12                     [facility] Adult PT Treatment/Exercise - 09/08/15 1031    Knee/Hip Exercises: Aerobic   Elliptical Ramp 5 Res 5 x 5 minutes   Knee/Hip Exercises: Standing   Heel Raises 1 set;15 reps  with single leg lowering   Other Standing Knee Exercises review of gastroc and soleus stretches bilatearlly   Knee/Hip Exercises: Seated   Other Seated Knee/Hip Exercises seated ankle eversion focused ion eccentrics and progressed to blue band   Ankle Exercises: Machines for Strengthening   Cybex Leg Press 2 plates bil heel raises x 20   Ankle Exercises: Standing   BAPS Standing;Level 3   BAPS Limitations circles each way as well as DF/PF bilateral  tactile cues for right ankle to decrease compensation   SLS 4 way hip with SLS and progressed to green band band x 20 each way bilateral  added foam pad   Rebounder right more difficult -multiple tosses   Heel Raises 20 reps;5 seconds  slow eccentric lowering, single leg   Heel Walk (Round Trip) 76f   Other Standing Ankle Exercises Box heel lifts and eccentric lowers x 10 left    Other Standing Ankle Exercises Bosu step up and balance x 5 sec, x10 each leg   Ankle Exercises: Stretches   Soleus Stretch 3 reps;20 seconds  cues for correct form   Gastroc Stretch 2 reps;30 seconds  step                  PT Short Term Goals - 09/01/15 1205    PT SHORT TERM GOAL #1   Title pt will be I with inital HEP (08/31/2015)   Time 3   Period Weeks   Status Achieved   PT SHORT TERM GOAL #2   Title pt will be able to verbalize and demonstrate techniques to reduce L/R calf pain and inflammation via stretching, and RICE (08/31/2015)   Time 3   Period Weeks   Status Achieved           PT Long Term Goals - 09/01/15 1205    PT LONG TERM GOAL #1   Title pt will be I with all HEP given throughout therapy (09/21/2015)   Time 6   Period Weeks   Status On-going   PT LONG TERM GOAL #2   Title pt will demonstrate little to no dropping of the arch during prolonged running and stomping activities to decrease pain and straining in the calf (09/21/2015)   Time 6   Period Weeks   Status On-going   PT LONG TERM GOAL #3   Title pt will demonstrate no tightness or  tenderness in the posterior tibialis to promote <2/10 pain during and following running for > 3-4 miles (09/21/2015)   Time 6   Period Weeks   Status On-going               Plan - 09/08/15 1032    Clinical Impression Statement Tenderness with palpation only in left calf otherwise no pain with daily activities swimming or cycling. He has not jooged in almost 2 weeks  and will not until after appt with Dr Oneida Alar next week. Continued focus on ankle and hip strength without increased pain. He can perform 25 single heel raises on each side without increased pain as well as single heel raises on edge of step. Progressed eversion band to blue secondary to improved motor control/strength with eccentrics.    PT Next Visit Plan dry needling as needed, see what Dr Oneida Alar recommends. Continue SLS activities and ankle strength, eccentric PF, progress not or renewal in next 2 visits.         Problem List Patient Active Problem List   Diagnosis Date Noted  . Pain of left calf 07/18/2015  . Umbilical hernia 33/29/5188    Dorene Ar, PTA 09/08/2015, 11:27 AM  [facility] [address] [place], [place], [address] Phone: [phone]   Fax:  [phone]  Name: CLEVEN JANSMA MRN: [id number] Date of Birth: July 29, 1951    PHYSICAL THERAPY DISCHARGE SUMMARY  Visits from Start of Care: 8  Current functional level related to goals / functional outcomes: See goals   Remaining deficits: Unknown   Education / Equipment: HEP  Plan:                                                    Patient goals were not met. Patient is being discharged due to not returning since the last visit.  ?????        Kristoffer Leamon PT, DPT, LAT, ATC  02/06/2016  2:11 PM

## 2015-09-12 ENCOUNTER — Encounter: Payer: Self-pay | Admitting: Sports Medicine

## 2015-09-12 ENCOUNTER — Ambulatory Visit (INDEPENDENT_AMBULATORY_CARE_PROVIDER_SITE_OTHER): Payer: Medicare Other | Admitting: Sports Medicine

## 2015-09-12 VITALS — BP 129/69 | Ht 71.0 in | Wt 158.0 lb

## 2015-09-12 DIAGNOSIS — M79662 Pain in left lower leg: Secondary | ICD-10-CM | POA: Diagnosis not present

## 2015-09-12 MED ORDER — AMITRIPTYLINE HCL 10 MG PO TABS: 10.0000 mg | ORAL_TABLET | Freq: Every day | ORAL | Status: DC

## 2015-09-12 MED ORDER — CYCLOBENZAPRINE HCL 5 MG PO TABS: 5.0000 mg | ORAL_TABLET | Freq: Every day | ORAL | Status: DC

## 2015-09-12 NOTE — Progress Notes (Signed)
   Subjective:    Patient ID: Paul [name], male    DOB: 03-11-1951, 64 y.o.   MRN: [id number]  HPI 64 y/o male presents for f/u of ongoing calf pain (LEFT) for which he is being treated with NTG protocol (finished over the weekend) in addition to physical therapy, ASA and vitamin C.  Patient has also pursued treatment on his own with Dry Needling of the LEFT calf, last episode on 01 Sep 2015.  Patient resumed running yesterday (on a treadmill) and was doing a slow run at a 9 min 30 sec pace but had return of sharp pain in his left calf after 0.9 miles.  Also performs biking and swimming as a part of triathlon training but symptom free with all activities aside from running.  Weight lifted this AM without pain.  Pain localizes/starts in the medial gastrocnemius' distal aspect, near then insertion with soleus into AT.  Review of Systems  10 point ROS unremarkable other than above    Objective:   Physical Exam  General: Fit male in 60s sitting comfortably on exam table in no apparent distress.  Lower extremities / RIGHT gastrocnemius exam: No swelling or ecchymoses on inspection; no obvious muscle defect. TTP over the inferiro aspect of medial gastrocneimus but no palable defect \  5/5 strength with plantar/dorsiflexion/inversion/eversion of ankle, all without pain.  Plantar flexion while standing on stool was obsereved.  Alfredson exercise observed without pain/defect.   Limited MS[facility] ultrasound (left calf): ~2 x 0.5 cm oval-shaped scar tissue present in the lateral gastrocnemius, surrounding a vein at junction of lateral gastroc and soleus; mild amount of edema and blood flow surrounding the scar tissue. Dilated vessels w varicose change deep in calf.  Running gait: Forefoot striker, forward aligned.      Assessment & Plan:    64 y/o presenting for follow-up for suspected soleus strain with ~2 x 0.5 cm [place] oval-shaped scar tissue present in the lateral gastrocnemius surrounding  a vessel at junction of lateral gastroc and soleus with a mild amount of edema and blood flow. Coupled with the patient's discomfort during activity, suggests that his pain may be secondary to the injury of varicose veins and their penetrating vessels vs a soleus injury (patient reported pain with knee flexion and foot dorsiflexion). Suspect patient will respond well to conservative therapy as discussed below.  - Continue NG protocol, explained to patient may not expect to see results until a few months from now - Continue compression and heel lift during exercise - Vitamin C 500 mg qd and ASA 81 mg for collagen formation and clot risk reduction, respectively Add amitriptyline 10 at HS to see if we can block any mm spasm - Resume Alfredson's exercises and usual training regimen with pain as guide - Follow-up 4-6 weeks or unless needed in the interim

## 2015-09-12 NOTE — Assessment & Plan Note (Signed)
I suspect thisis not the typical gastroc/ soleus injury poerhaps related to recurrent micro teats particularly in areas where he has large varicosities  See plan

## 2015-09-13 ENCOUNTER — Encounter: Payer: Medicare Other | Admitting: Physical Therapy

## 2015-09-14 DIAGNOSIS — M9901 Segmental and somatic dysfunction of cervical region: Secondary | ICD-10-CM | POA: Diagnosis not present

## 2015-09-14 DIAGNOSIS — M9903 Segmental and somatic dysfunction of lumbar region: Secondary | ICD-10-CM | POA: Diagnosis not present

## 2015-09-14 DIAGNOSIS — S29012A Strain of muscle and tendon of back wall of thorax, initial encounter: Secondary | ICD-10-CM | POA: Diagnosis not present

## 2015-09-14 DIAGNOSIS — M9902 Segmental and somatic dysfunction of thoracic region: Secondary | ICD-10-CM | POA: Diagnosis not present

## 2015-09-14 DIAGNOSIS — S138XXA Sprain of joints and ligaments of other parts of neck, initial encounter: Secondary | ICD-10-CM | POA: Diagnosis not present

## 2015-09-14 DIAGNOSIS — S39012A Strain of muscle, fascia and tendon of lower back, initial encounter: Secondary | ICD-10-CM | POA: Diagnosis not present

## 2015-09-15 ENCOUNTER — Other Ambulatory Visit (INDEPENDENT_AMBULATORY_CARE_PROVIDER_SITE_OTHER): Payer: Medicare Other

## 2015-09-15 ENCOUNTER — Ambulatory Visit (INDEPENDENT_AMBULATORY_CARE_PROVIDER_SITE_OTHER): Payer: Medicare Other | Admitting: Family Medicine

## 2015-09-15 ENCOUNTER — Encounter: Payer: Self-pay | Admitting: Family Medicine

## 2015-09-15 ENCOUNTER — Ambulatory Visit: Payer: Self-pay | Admitting: Physical Therapy

## 2015-09-15 VITALS — BP 122/72 | HR 64 | Ht 71.0 in | Wt 164.0 lb

## 2015-09-15 DIAGNOSIS — M79662 Pain in left lower leg: Secondary | ICD-10-CM

## 2015-09-15 MED ORDER — GABAPENTIN 100 MG PO CAPS: 200.0000 mg | ORAL_CAPSULE | Freq: Every day | ORAL | Status: DC

## 2015-09-15 NOTE — Progress Notes (Signed)
Pre visit review using our clinic review tool, if applicable. No additional management support is needed unless otherwise documented below in the visit note. 

## 2015-09-15 NOTE — Assessment & Plan Note (Addendum)
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 Patient has been treated appropriately with a heel lift, compression, and even tried nitroglycerin without any significant improvement. Differential also includes with patient past medical history spinal stenosis this is any radicular pain. We may need to consider further workup for patient's back or knee and long-term. We also discussed different more aggressive and possible treatment options with patient having localized tenderness on exam today. We discussed the possibility of a injection for diagnostic and therapeutic purposes. This could help [place] delineate where the pain is. We also discussed the possibility of PRP injections. Patient elected try conservative therapy at this time with increasing iron level any will take this with his vitamin C as well as we discussed if this is cramping turmeric may be beneficial. Patient will try this and come back in 3-4 weeks. At that point we'll discuss it further imaging or intervention is necessary.

## 2015-09-15 NOTE — Progress Notes (Signed)
[name] [address] [place], [place] [address] Phone: [phone] Subjective:     CC: Left calf pain  ALP:[id number] Paul [name] is a 64 y.o. male coming in with complaint of left calf pain. Patient states he has had this pain intermittently of the left hand for approximately 2 years but seems to be worsening. Patient has seen multiple different providers for this pain previously. Patient has done many different modalities including formal physical therapy, dry needling, vitamin C, aspirin, as well as nitroglycerin protocol. Patient states that none of the seem to make any significant improvement. She tried to resume his running regimen but it with in 1 mile he unfortunately has severe pain in the left calf that makes him stop running. Soreness for approximately 2 days thereafter. Patient describes it as more of a sharp pain followed by a dull aching sensation. Denies any numbness. Denies any swelling or discoloration when it occurs. Patient rates the severity of pain though when it occurs is 8 out of 10. Patient is training for more of triathlons which is a different environment than when he was doing just biking.  Past Medical History  Diagnosis Date  . Hyperlipidemia   . Spinal stenosis     WAS HAVING NECK PAIN -PT STATES NERVE ABLATION PROCEDURE 2012 AND PAIN HAS RESOLVED  . Spondylitis ([place])   . Depression     RESOLVED   Past Surgical History  Procedure Laterality Date  . Appendectomy  1962  . Tonsillectomy  1968  . Knee surgery  [place]    left  . Umbilical hernia repair  10/06/2012    Procedure: HERNIA REPAIR UMBILICAL ADULT;  Surgeon: Imogene Burn. Georgette Dover, MD;  Location: [facility];  Service: General;  Laterality: N/A;  . Insertion of mesh  10/06/2012    Procedure: INSERTION OF MESH;  Surgeon: Imogene Burn. Georgette Dover, MD;  Location: [facility];  Service: General;  Laterality: N/A;  . Hernia repair     Social History   Social History  . Marital  Status: Married    Spouse Name: N/A  . Number of Children: N/A  . Years of Education: N/A   Occupational History  . Not on file.   Social History Main Topics  . Smoking status: Never Smoker   . Smokeless tobacco: Not on file  . Alcohol Use: Yes     Comment: OCCAS ALCOHOL  . Drug Use: No  . Sexual Activity: Not on file   Other Topics Concern  . Not on file   Social History Narrative   Allergies  Allergen Reactions  . Crestor [Rosuvastatin]     MUSCLE CRAMPS  . Lipitor [Atorvastatin]     Liver toxicity       Family History  Problem Relation Age of Onset  . Stroke Mother   . Heart disease Father   . Cancer Sister     bone     Past medical history, social, surgical and family history all reviewed in electronic medical record.   Review of Systems: No headache, visual changes, nausea, vomiting, diarrhea, constipation, dizziness, abdominal pain, skin rash, fevers, chills, night sweats, weight loss, swollen lymph nodes, body aches, joint swelling, muscle aches, chest pain, shortness of breath, mood changes.   Objective Blood pressure 122/72, pulse 64, height 5\' 11"  (1.803 m), weight 164 lb (74.39 kg), SpO2 98 %.  General: No apparent distress alert and oriented x3 mood and affect normal, dressed appropriately.  HEENT: Pupils  equal, extraocular movements intact  Respiratory: Patient's speak in full sentences and does not appear short of breath  Cardiovascular: No lower extremity edema, non tender, no erythema  Skin: Warm dry intact with no signs of infection or rash on extremities or on axial skeleton.  Abdomen: Soft nontender  Neuro: Cranial nerves II through XII are intact, neurovascularly intact in all extremities with 2+ DTRs and 2+ pulses.  Lymph: No lymphadenopathy of posterior or anterior cervical chain or axillae bilaterally.  Gait normal with good balance and coordination.  MSK:  Non tender with full range of motion and good stability and symmetric strength and  tone of shoulders, elbows, wrist, hip, and ankles bilaterally.  Knee: Left Normal to inspection with no erythema or effusion or obvious bony abnormalities. Palpation normal with no warmth, joint line tenderness, patellar tenderness, or condyle tenderness. Patient does have some discomfort at the muscular tendon juncture on the medial aspect of the gastrocnemius head. No Achilles pain. ROM full in flexion and extension and lower leg rotation. Ligaments with solid consistent endpoints including ACL, PCL, LCL, MCL. Negative Mcmurray's, Apley's, and Thessalonian tests. Non painful patellar compression. Patellar glide without crepitus. Patellar and quadriceps tendons unremarkable. Hamstring and quadriceps strength is normal.  Full range of motion of the ankle and neurovascularly intact distally.  Gait analysis shows the patient does have his knees across midline consistent with hip abductor weakness otherwise very neutral rotated position   MSK US performed of: Left This study was ordered, performed, and interpreted by Charlann Boxer D.O.  Knee: All structures visualized. Anteromedial, anterolateral, posteromedial, and posterolateral menisci unremarkable without tearing, fraying, effusion, or displacement. Mild narrowing of joint space Patient does have an area near the gastrocnemius soleus and tendon juncture. This area does seem to have significant scar tissue formation surrounding neurovascular bundle. Hypoechoic changes on either side. Increasing Doppler flow noted. With palpation in this is where patient feels the discomfort. Surrounding varicose veins noted.  IMPRESSION:  Scar tissue formation at the gastrocnemius psoas tendon this juncture of the left calf with continued increasing Doppler flow.    Impression and Recommendations:     This case required medical decision making of moderate complexity.

## 2015-09-15 NOTE — Patient Instructions (Addendum)
Good to see you.  Ice 20 minutes 2 times daily. Usually after activity and before bed. Continue the compression I think Stop the amitriptyline.  Start the gabapentin 100mg  at night for 1st week then 200mg  at night 4:1 ratio of carbs to protein after exercise 1 gram of protein per pound of body weight daily Increase water intake Turmeric 500mg  twice daily Iron 65mg  daily Exercises on wall.  Heel and butt touching.  Raise leg 6 inches and hold 2 seconds.  Down slow for count of 4 seconds.  1 set of 30 reps daily on both sides.  Continue the other stretches Look into PRP See me again in 3-4 weeks.

## 2015-09-16 ENCOUNTER — Encounter: Payer: Self-pay | Admitting: Family Medicine

## 2015-09-20 ENCOUNTER — Encounter: Payer: Medicare Other | Admitting: Physical Therapy

## 2015-09-20 ENCOUNTER — Ambulatory Visit: Payer: Medicare Other | Admitting: Family Medicine

## 2015-09-21 DIAGNOSIS — S29012A Strain of muscle and tendon of back wall of thorax, initial encounter: Secondary | ICD-10-CM | POA: Diagnosis not present

## 2015-09-21 DIAGNOSIS — M9902 Segmental and somatic dysfunction of thoracic region: Secondary | ICD-10-CM | POA: Diagnosis not present

## 2015-09-21 DIAGNOSIS — M9901 Segmental and somatic dysfunction of cervical region: Secondary | ICD-10-CM | POA: Diagnosis not present

## 2015-09-21 DIAGNOSIS — S138XXA Sprain of joints and ligaments of other parts of neck, initial encounter: Secondary | ICD-10-CM | POA: Diagnosis not present

## 2015-09-21 DIAGNOSIS — M9903 Segmental and somatic dysfunction of lumbar region: Secondary | ICD-10-CM | POA: Diagnosis not present

## 2015-09-21 DIAGNOSIS — S39012A Strain of muscle, fascia and tendon of lower back, initial encounter: Secondary | ICD-10-CM | POA: Diagnosis not present

## 2015-09-22 ENCOUNTER — Encounter: Payer: Medicare Other | Admitting: Physical Therapy

## 2015-09-29 ENCOUNTER — Encounter: Payer: Self-pay | Admitting: Family Medicine

## 2015-09-29 ENCOUNTER — Ambulatory Visit (INDEPENDENT_AMBULATORY_CARE_PROVIDER_SITE_OTHER): Payer: Medicare Other | Admitting: Family Medicine

## 2015-09-29 VITALS — BP 126/70 | HR 64 | Ht 71.0 in | Wt 166.0 lb

## 2015-09-29 DIAGNOSIS — M79662 Pain in left lower leg: Secondary | ICD-10-CM

## 2015-09-29 DIAGNOSIS — R252 Cramp and spasm: Secondary | ICD-10-CM

## 2015-09-29 NOTE — Patient Instructions (Signed)
Good to see you Ice still is good idea Send me the labs We will try to get the ABI with exercise If all normal then consider injection or new medicine for the veins We will discuss when to follow up  Continue everything else for now.

## 2015-09-29 NOTE — Progress Notes (Signed)
[name] [address] [place], [place] [address] Phone: [phone] Subjective:     CC: Left calf pain  [id number] Paul [name] is a 64 y.o. male coming in with complaint of left calf pain. Patient states he has had this pain intermittently of the left hand for approximately 2 years but seems to be worsening. Patient has seen multiple different providers for this pain previously. Patient has done many different modalities including formal physical therapy, dry needling, vitamin C, aspirin, as well as nitroglycerin protocol. Patient on ultrasound was found to have some scar tissue formation in the gastrocnemius itself. Patient was to do some stretches exercises and icing. Patient states he went for a run and forcefully had to stop when he was going through the running progression secondary to pain. Stated that the dull throbbing aching sensation occurred for 2 days after the run. Patient then went for a bike ride and when he was done with the bike right it seemed to be much better. Patient states he has not notice any significant improvement. Would not say is worsening. Has done all recommendations so far.  Past Medical History  Diagnosis Date  . Hyperlipidemia   . Spinal stenosis     WAS HAVING NECK PAIN -PT STATES NERVE ABLATION PROCEDURE 2012 AND PAIN HAS RESOLVED  . Spondylitis ([place])   . Depression     RESOLVED   Past Surgical History  Procedure Laterality Date  . Appendectomy  1962  . Tonsillectomy  1968  . Knee surgery  [place]    left  . Umbilical hernia repair  10/06/2012    Procedure: HERNIA REPAIR UMBILICAL ADULT;  Surgeon: Imogene Burn. Georgette Dover, MD;  Location: [facility];  Service: General;  Laterality: N/A;  . Insertion of mesh  10/06/2012    Procedure: INSERTION OF MESH;  Surgeon: Imogene Burn. Georgette Dover, MD;  Location: [facility];  Service: General;  Laterality: N/A;  . Hernia repair     Social History   Social History  . Marital Status:  Married    Spouse Name: N/A  . Number of Children: N/A  . Years of Education: N/A   Occupational History  . Not on file.   Social History Main Topics  . Smoking status: Never Smoker   . Smokeless tobacco: Not on file  . Alcohol Use: Yes     Comment: OCCAS ALCOHOL  . Drug Use: No  . Sexual Activity: Not on file   Other Topics Concern  . Not on file   Social History Narrative   Allergies  Allergen Reactions  . Crestor [Rosuvastatin]     MUSCLE CRAMPS  . Lipitor [Atorvastatin]     Liver toxicity       Family History  Problem Relation Age of Onset  . Stroke Mother   . Heart disease Father   . Cancer Sister     bone     Past medical history, social, surgical and family history all reviewed in electronic medical record.   Review of Systems: No headache, visual changes, nausea, vomiting, diarrhea, constipation, dizziness, abdominal pain, skin rash, fevers, chills, night sweats, weight loss, swollen lymph nodes, body aches, joint swelling, muscle aches, chest pain, shortness of breath, mood changes.   Objective Blood pressure 126/70, pulse 64, height 5\' 11"  (1.803 m), weight 166 lb (75.297 kg), SpO2 97 %.  General: No apparent distress alert and oriented x3 mood and affect normal, dressed appropriately.  HEENT: Pupils equal, extraocular  movements intact  Respiratory: Patient's speak in full sentences and does not appear short of breath  Cardiovascular: No lower extremity edema, non tender, no erythema  Skin: Warm dry intact with no signs of infection or rash on extremities or on axial skeleton.  Abdomen: Soft nontender  Neuro: Cranial nerves II through XII are intact, neurovascularly intact in all extremities with 2+ DTRs and 2+ pulses.  Lymph: No lymphadenopathy of posterior or anterior cervical chain or axillae bilaterally.  Gait normal with good balance and coordination.  MSK:  Non tender with full range of motion and good stability and symmetric strength and tone of  shoulders, elbows, wrist, hip, and ankles bilaterally.  Knee: Left Normal to inspection with no erythema or effusion or obvious bony abnormalities. Palpation normal with no warmth, joint line tenderness, patellar tenderness, or condyle tenderness. Patient does have some discomfort at the muscular tendon juncture on the medial aspect of the gastrocnemius head. No Achilles pain. ROM full in flexion and extension and lower leg rotation. Ligaments with solid consistent endpoints including ACL, PCL, LCL, MCL. Negative Mcmurray's, Apley's, and Thessalonian tests. Non painful patellar compression. Patellar glide without crepitus. Patellar and quadriceps tendons unremarkable. Hamstring and quadriceps strength is normal.  Full range of motion of the ankle and neurovascularly intact distally.      Impression and Recommendations:     This case required medical decision making of moderate complexity.

## 2015-09-29 NOTE — Progress Notes (Signed)
Pre visit review using our clinic review tool, if applicable. No additional management support is needed unless otherwise documented below in the visit note. 

## 2015-09-29 NOTE — Assessment & Plan Note (Signed)
Continues to be a very difficult workup. Patient has not had any significant findings are. Continues to have been problem no. Patient is tried many different modalities without any significant benefit. Patient has had labs from an outside facility and we will review these. In addition of this we may need to consider checking for vascular compromise that could also be contributing. If patient's ABIs are normal then when I would consider is possible injection into the scar tissue formation that we have seen previously and if no significant improvement x-rays and advance imaging to rule out anything else that could be concerning. Patient is in agreement with this plan and we will discuss after further workup  Spent  25 minutes with patient face-to-face and had greater than 50% of counseling including as described above in assessment and plan.

## 2015-10-16 ENCOUNTER — Ambulatory Visit (INDEPENDENT_AMBULATORY_CARE_PROVIDER_SITE_OTHER): Payer: Medicare Other | Admitting: Sports Medicine

## 2015-10-16 ENCOUNTER — Encounter: Payer: Self-pay | Admitting: Sports Medicine

## 2015-10-16 VITALS — BP 120/70 | HR 80 | Ht 71.0 in | Wt 166.0 lb

## 2015-10-16 DIAGNOSIS — M79662 Pain in left lower leg: Secondary | ICD-10-CM

## 2015-10-16 NOTE — Progress Notes (Signed)
Patient ID: Paul [name], male   DOB: Mar 16, 1951, 64 y.o.   MRN: [id number]  I mo ago unable to run on TM 2/2 LT calf pain Race walked and added running after 2 wks Steadily built up to 4 to 5 miles  Now better Ran 6 miles - mostly soft - [place] w no pain  Doing calf raises Band exercises  Abduction exercises  chocholate milk for recovery  ROS No recnt infections No feveror chills No calf swelling  Exam NAD BP 120/70 mmHg  Pulse 80  Ht 5\' 11"  (1.803 m)  Wt 166 lb (75.297 kg)  BMI 23.16 kg/m2  No palpable tenderness in either calf Good strength on testing Heel raise with no difficulty Running gait has returned to normal Forefoot strike

## 2015-10-16 NOTE — Assessment & Plan Note (Signed)
Much improved  See instructions  I think gabapentin and race walk exercises have helped most  Will keep these and gradually drop other Tx  If better in 2 mos can RTCprn

## 2015-10-16 NOTE — Patient Instructions (Signed)
Stop flexeril  Keep up gabapentin  Keep up iron and Vit C  Tumeric - try ever other day and if no change stop  Keep up some race walking warm up as I think that may be what helped  If this stays better we will stop the gabapentin at 4 to 6 weeks

## 2015-10-17 ENCOUNTER — Ambulatory Visit: Payer: Medicare Other | Admitting: Sports Medicine

## 2015-10-17 DIAGNOSIS — M9901 Segmental and somatic dysfunction of cervical region: Secondary | ICD-10-CM | POA: Diagnosis not present

## 2015-10-17 DIAGNOSIS — M9902 Segmental and somatic dysfunction of thoracic region: Secondary | ICD-10-CM | POA: Diagnosis not present

## 2015-10-17 DIAGNOSIS — S29012A Strain of muscle and tendon of back wall of thorax, initial encounter: Secondary | ICD-10-CM | POA: Diagnosis not present

## 2015-10-17 DIAGNOSIS — M9903 Segmental and somatic dysfunction of lumbar region: Secondary | ICD-10-CM | POA: Diagnosis not present

## 2015-10-17 DIAGNOSIS — S138XXA Sprain of joints and ligaments of other parts of neck, initial encounter: Secondary | ICD-10-CM | POA: Diagnosis not present

## 2015-10-17 DIAGNOSIS — S39012A Strain of muscle, fascia and tendon of lower back, initial encounter: Secondary | ICD-10-CM | POA: Diagnosis not present

## 2015-11-06 ENCOUNTER — Other Ambulatory Visit (INDEPENDENT_AMBULATORY_CARE_PROVIDER_SITE_OTHER): Payer: Medicare Other

## 2015-11-06 ENCOUNTER — Ambulatory Visit (INDEPENDENT_AMBULATORY_CARE_PROVIDER_SITE_OTHER): Payer: Medicare Other | Admitting: Family Medicine

## 2015-11-06 ENCOUNTER — Encounter: Payer: Self-pay | Admitting: Family Medicine

## 2015-11-06 VITALS — BP 116/62 | HR 69 | Ht 71.0 in | Wt 164.0 lb

## 2015-11-06 DIAGNOSIS — M79661 Pain in right lower leg: Secondary | ICD-10-CM | POA: Diagnosis not present

## 2015-11-06 DIAGNOSIS — S86111A Strain of other muscle(s) and tendon(s) of posterior muscle group at lower leg level, right leg, initial encounter: Secondary | ICD-10-CM

## 2015-11-06 MED ORDER — CYCLOBENZAPRINE HCL 5 MG PO TABS: 5.0000 mg | ORAL_TABLET | Freq: Every day | ORAL | Status: DC

## 2015-11-06 MED ORDER — CILOSTAZOL 50 MG PO TABS: 25.0000 mg | ORAL_TABLET | Freq: Two times a day (BID) | ORAL | Status: DC

## 2015-11-06 NOTE — Patient Instructions (Addendum)
Good to see you Duexis 3 times daily for 6 days Ice right after activity Continue the protein Start the heel lift in right shoe.  Decrease activity to 50% and increase 10 % a week.  Pletal 1/2 tab twice daily to help with blood flow to the legs.  Continue the compression.  See me again in 3 weeks and we will make sure you are on track.  Happy holidays!

## 2015-11-06 NOTE — Assessment & Plan Note (Signed)
Patient did have more of an injury to his soleus. I do think that this is new in nature. Patient continues to have lower extremity difficulties. We have done a fairly good workup without any significant findings. Patient has had normal potassium but a hypokalemia paralysis Barton cramping of the lower extremity could be appended 10 shoulder. Differential also still includes vascular compromise such as a exertional compartment syndrome. We did not see that on the initial ABI but possibly opening pressures may be needed in the long-term. We discussed continuing the compression and patient given home exercises. Refill of muscle relaxers which patient thinks is beneficial. We did discuss the patient has been on the nitroglycerin patches and could start this for healing of the muscle. I do think the most likely though is a vascular type response to his exercises. We are going to start him on Pletal at a low dose. Warned the potential side effects. Patient will try this and we'll see how he responds. Come back in 2-3 weeks for further evaluation and treatment.  Spent  25 minutes with patient face-to-face and had greater than 50% of counseling including as described above in assessment and plan.

## 2015-11-06 NOTE — Progress Notes (Signed)
Pre visit review using our clinic review tool, if applicable. No additional management support is needed unless otherwise documented below in the visit note. 

## 2015-11-06 NOTE — Progress Notes (Signed)
[name] [address] [place], [place] [address] Phone: [phone] Subjective:     CC: right calf pain  [id number] Paul [name] is a 64 y.o. male coming in with complaint of right calf pain. Patient was seen previously for left calf pain. Patient states that the left calf was doing significantly better at last follow-up. Patient states he was increasing his activity and doing more of the race walking. Patient states though that unfortunately when he started doing more of the hill training he started having increasing discomfort in his right calf. Had one initial time that did seem to be an injury. Did not have any swelling. Patient though states that 24 hours after the run he was unable to even ambulate. Patient denied any numbness.  Past Medical History  Diagnosis Date  . Hyperlipidemia   . Spinal stenosis     WAS HAVING NECK PAIN -PT STATES NERVE ABLATION PROCEDURE 2012 AND PAIN HAS RESOLVED  . Spondylitis ([place])   . Depression     RESOLVED   Past Surgical History  Procedure Laterality Date  . Appendectomy  1962  . Tonsillectomy  1968  . Knee surgery  [place]    left  . Umbilical hernia repair  10/06/2012    Procedure: HERNIA REPAIR UMBILICAL ADULT;  Surgeon: Imogene Burn. Georgette Dover, MD;  Location: [facility];  Service: General;  Laterality: N/A;  . Insertion of mesh  10/06/2012    Procedure: INSERTION OF MESH;  Surgeon: Imogene Burn. Georgette Dover, MD;  Location: [facility];  Service: General;  Laterality: N/A;  . Hernia repair     Social History   Social History  . Marital Status: Married    Spouse Name: N/A  . Number of Children: N/A  . Years of Education: N/A   Occupational History  . Not on file.   Social History Main Topics  . Smoking status: Never Smoker   . Smokeless tobacco: Not on file  . Alcohol Use: Yes     Comment: OCCAS ALCOHOL  . Drug Use: No  . Sexual Activity: Not on file   Other Topics Concern  . Not on file   Social  History Narrative   Allergies  Allergen Reactions  . Crestor [Rosuvastatin]     MUSCLE CRAMPS  . Lipitor [Atorvastatin]     Liver toxicity       Family History  Problem Relation Age of Onset  . Stroke Mother   . Heart disease Father   . Cancer Sister     bone     Past medical history, social, surgical and family history all reviewed in electronic medical record.   Review of Systems: No headache, visual changes, nausea, vomiting, diarrhea, constipation, dizziness, abdominal pain, skin rash, fevers, chills, night sweats, weight loss, swollen lymph nodes, body aches, joint swelling, muscle aches, chest pain, shortness of breath, mood changes.   Objective Blood pressure 116/62, pulse 69, height 5\' 11"  (1.803 m), weight 164 lb (74.39 kg), SpO2 98 %.  General: No apparent distress alert and oriented x3 mood and affect normal, dressed appropriately.  HEENT: Pupils equal, extraocular movements intact  Respiratory: Patient's speak in full sentences and does not appear short of breath  Cardiovascular: No lower extremity edema, non tender, no erythema  Skin: Warm dry intact with no signs of infection or rash on extremities or on axial skeleton.  Abdomen: Soft nontender  Neuro: Cranial nerves II through XII are intact, neurovascularly intact in all  extremities with 2+ DTRs and 2+ pulses.  Lymph: No lymphadenopathy of posterior or anterior cervical chain or axillae bilaterally.  Gait normal with good balance and coordination.  MSK:  Non tender with full range of motion and good stability and symmetric strength and tone of shoulders, elbows, wrist, hip, and ankles bilaterally.  Knee: Right Normal to inspection with no erythema or effusion or obvious bony abnormalities. Palpation normal with no warmth, joint line tenderness, patellar tenderness, or condyle tenderness.  Mild tenderness over the gastroc at junction of the achilles. Mild Achilles pain. ROM full in flexion and extension and lower  leg rotation. Ligaments with solid consistent endpoints including ACL, PCL, LCL, MCL. Negative Mcmurray's, Apley's, and Thessalonian tests. Non painful patellar compression. Patellar glide with crepitus. Patellar and quadriceps tendons unremarkable. Hamstring and quadriceps strength is normal.  Contralateral side unremarkable today.   Limited muscular skeletal ultrasound was performed and interpreted by Hulan Saas, M  Limited ultrasound showed patient did have what seems to be a new soleus tear with increasing Doppler flow as well as scar tissue formation already. Patient's gastrocnemius did not seem to have any remarkable changes. Patient's Achilles no nodule noted. Impression: Soleus tear versus strain.    Impression and Recommendations:     This case required medical decision making of moderate complexity.

## 2015-11-17 ENCOUNTER — Encounter: Payer: Self-pay | Admitting: Family Medicine

## 2015-11-17 ENCOUNTER — Other Ambulatory Visit: Payer: Self-pay | Admitting: Sports Medicine

## 2015-11-17 ENCOUNTER — Ambulatory Visit (INDEPENDENT_AMBULATORY_CARE_PROVIDER_SITE_OTHER): Payer: Medicare Other | Admitting: Family Medicine

## 2015-11-17 ENCOUNTER — Other Ambulatory Visit (INDEPENDENT_AMBULATORY_CARE_PROVIDER_SITE_OTHER): Payer: Medicare Other

## 2015-11-17 VITALS — BP 122/70 | HR 65 | Ht 71.0 in | Wt 163.0 lb

## 2015-11-17 DIAGNOSIS — M25521 Pain in right elbow: Secondary | ICD-10-CM

## 2015-11-17 DIAGNOSIS — S86111A Strain of other muscle(s) and tendon(s) of posterior muscle group at lower leg level, right leg, initial encounter: Secondary | ICD-10-CM

## 2015-11-17 DIAGNOSIS — M7701 Medial epicondylitis, right elbow: Secondary | ICD-10-CM | POA: Diagnosis not present

## 2015-11-17 NOTE — Patient Instructions (Signed)
Keep trucking along with the calf, I think you will do well Elbow exercises 3 times a week Wear brace day and night for 1 week then nightly for 12 weeks pennsaid pinkie amount topically 2 times daily as needed.  Try 1/4 patch of nitro in the area as well daily if it does not give you bad headache or rash.  Lift overhand for now Try to limit activity causing flexion of the wrist  See me again in 3-4 weeks.  Happy New Year!

## 2015-11-17 NOTE — Assessment & Plan Note (Signed)
She does have some epicondylitis noted. We discussed icing regimen and patient work with Product/process development scientist to learn home exercises in greater detail. Patient will do some wrist immobilization over the course of next several weeks. Patient has nitroglycerin patches and we'll try this as well. Warned of potential side effects which patient has done for other problems. Patient will see me again in 4 weeks to make sure he is healing appropriately.

## 2015-11-17 NOTE — Assessment & Plan Note (Signed)
Seems healing slowly. Likely going to take some time for it to completely go away. Patient is doing much better. We'll continue to monitor continue with the same regimen.

## 2015-11-17 NOTE — Progress Notes (Signed)
Pre visit review using our clinic review tool, if applicable. No additional management support is needed unless otherwise documented below in the visit note. 

## 2015-11-17 NOTE — Progress Notes (Signed)
Paul [name] [address] [place], [place] [address] Phone: [phone] Subjective:     CC: right calf pain New problem right elbow pain [id number] Paul [name] is a 64 y.o. male coming in with complaint of right calf pain. Patient was found to have more of a tear of the soleus muscle. Patient though was put on Pletal as well secondary to calf pain bilaterally. Patient states that he is making significant progress and would state that he is 50% better.  Regarding patient's right elbow. Patient has had this pain intermittently for the last 2 months. States that the initial injury was when he was trying also something heavy. States now it was exacerbated when he was swimming. Patient states it is giving him a dull throbbing aching pain. Even hurting him when he lifts things that seems only about 5-10 pounds. Tries to remain active but is having difficulty. No numbness in the hand or any weakness. Denies any pain at night. Rates the severity of 6 out of 10. Has responded somewhat anti-inflammatories.  Past Medical History  Diagnosis Date  . Hyperlipidemia   . Spinal stenosis     WAS HAVING NECK PAIN -PT STATES NERVE ABLATION PROCEDURE 2012 AND PAIN HAS RESOLVED  . Spondylitis ([place])   . Depression     RESOLVED   Past Surgical History  Procedure Laterality Date  . Appendectomy  1962  . Tonsillectomy  1968  . Knee surgery  [place]    left  . Umbilical hernia repair  10/06/2012    Procedure: HERNIA REPAIR UMBILICAL ADULT;  Surgeon: Imogene Burn. Georgette Dover, MD;  Location: [facility];  Service: General;  Laterality: N/A;  . Insertion of mesh  10/06/2012    Procedure: INSERTION OF MESH;  Surgeon: Imogene Burn. Georgette Dover, MD;  Location: [facility];  Service: General;  Laterality: N/A;  . Hernia repair     Social History   Social History  . Marital Status: Married    Spouse Name: N/A  . Number of Children: N/A  . Years of Education: N/A   Occupational History  . Not  on file.   Social History Main Topics  . Smoking status: Never Smoker   . Smokeless tobacco: Not on file  . Alcohol Use: Yes     Comment: OCCAS ALCOHOL  . Drug Use: No  . Sexual Activity: Not on file   Other Topics Concern  . Not on file   Social History Narrative   Allergies  Allergen Reactions  . Crestor [Rosuvastatin]     MUSCLE CRAMPS  . Lipitor [Atorvastatin]     Liver toxicity       Family History  Problem Relation Age of Onset  . Stroke Mother   . Heart disease Father   . Cancer Sister     bone     Past medical history, social, surgical and family history all reviewed in electronic medical record.   Review of Systems: No headache, visual changes, nausea, vomiting, diarrhea, constipation, dizziness, abdominal pain, skin rash, fevers, chills, night sweats, weight loss, swollen lymph nodes, body aches, joint swelling, muscle aches, chest pain, shortness of breath, mood changes.   Objective There were no vitals taken for this visit.  General: No apparent distress alert and oriented x3 mood and affect normal, dressed appropriately.  HEENT: Pupils equal, extraocular movements intact  Respiratory: Patient's speak in full sentences and does not appear short of breath  Cardiovascular: No lower extremity edema, non  tender, no erythema  Skin: Warm dry intact with no signs of infection or rash on extremities or on axial skeleton.  Abdomen: Soft nontender  Neuro: Cranial nerves II through XII are intact, neurovascularly intact in all extremities with 2+ DTRs and 2+ pulses.  Lymph: No lymphadenopathy of posterior or anterior cervical chain or axillae bilaterally.  Gait normal with good balance and coordination.  MSK:  Non tender with full range of motion and good stability and symmetric strength and tone of shoulders, , wrist, hip, and ankles bilaterally.  Knee: Right Normal to inspection with no erythema or effusion or obvious bony abnormalities. Palpation normal with no  warmth, joint line tenderness, patellar tenderness, or condyle tenderness.  Minimal tenderness to the calf muscle which is an improvement ROM full in flexion and extension and lower leg rotation. Ligaments with solid consistent endpoints including ACL, PCL, LCL, MCL. Negative Mcmurray's, Apley's, and Thessalonian tests. Non painful patellar compression. Patellar glide with crepitus. Patellar and quadriceps tendons unremarkable. Hamstring and quadriceps strength is normal.  Contralateral side unremarkable today.   Elbow: Unremarkable to inspection. Range of motion full pronation, supination, flexion, extension. Strength is full to all of the above directions Stable to varus, valgus stress. Negative moving valgus stress test. Tender to palpation over the medial epicondylar region and pain with resisted wrist flexion Ulnar nerve does not sublux. Negative cubital tunnel Tinel's. Contralateral elbow has very minimal same symptoms but significantly better than right side.  Musculoskeletal ultrasound was performed and interpreted by Charlann Boxer D.O.   Elbow:  Lateral epicondyle and common extensor tendon origin visualized.  No edema, effusions, or avulsions seen.  Radial head unremarkable and located in annular ligament Medial epicondyle and common flexor tendon origin visualized. Scarring noted with hypoechoic changes. No acute tear appreciated. Patient does have significant irritation of the nerve in this area. Olecranon and triceps insertion visualized and unremarkable without edema, effusion, or avulsion.  No signs olecranon bursitis. Power doppler signal normal.  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  This included exercises focusing on stretching, strengthening, with significant focus on eccentric aspects.   Flexion and extension exercises working on eccentric's for wrist flexion.  Patient showed proper lifting technique to avoid significant amount of strain as well as proper swimming technique. Proper technique shown and discussed handout in great detail with ATC.  All questions were discussed and answered.     Impression and Recommendations:     This case required medical decision making of moderate complexity.

## 2015-11-20 MED FILL — GABAPENTIN 100 MG CAPSULE: 100 | 30 days supply | Qty: 60 | Fill #2 | Status: TO

## 2015-11-20 NOTE — Telephone Encounter (Signed)
You saw this patient on 11/17/15, see his refill request

## 2015-11-21 DIAGNOSIS — S39012A Strain of muscle, fascia and tendon of lower back, initial encounter: Secondary | ICD-10-CM | POA: Diagnosis not present

## 2015-11-21 DIAGNOSIS — M9901 Segmental and somatic dysfunction of cervical region: Secondary | ICD-10-CM | POA: Diagnosis not present

## 2015-11-21 DIAGNOSIS — S138XXA Sprain of joints and ligaments of other parts of neck, initial encounter: Secondary | ICD-10-CM | POA: Diagnosis not present

## 2015-11-21 DIAGNOSIS — M9902 Segmental and somatic dysfunction of thoracic region: Secondary | ICD-10-CM | POA: Diagnosis not present

## 2015-11-21 DIAGNOSIS — S29012A Strain of muscle and tendon of back wall of thorax, initial encounter: Secondary | ICD-10-CM | POA: Diagnosis not present

## 2015-11-21 DIAGNOSIS — M9903 Segmental and somatic dysfunction of lumbar region: Secondary | ICD-10-CM | POA: Diagnosis not present

## 2015-11-21 MED FILL — NITROGLYCERIN 0.2 MG/HR PTC: 0.2 | 90 days supply | Qty: 22 | Fill #0 | Status: TO

## 2015-11-23 ENCOUNTER — Ambulatory Visit: Payer: Medicare Other | Admitting: Family Medicine

## 2015-11-24 ENCOUNTER — Ambulatory Visit: Payer: Medicare Other | Admitting: Family Medicine

## 2015-11-27 ENCOUNTER — Ambulatory Visit: Payer: Medicare Other | Admitting: Family Medicine

## 2015-12-11 ENCOUNTER — Encounter: Payer: Self-pay | Admitting: Family Medicine

## 2015-12-11 ENCOUNTER — Ambulatory Visit (INDEPENDENT_AMBULATORY_CARE_PROVIDER_SITE_OTHER): Payer: Medicare Other | Admitting: Family Medicine

## 2015-12-11 VITALS — BP 124/78 | HR 68 | Wt 163.0 lb

## 2015-12-11 DIAGNOSIS — M7701 Medial epicondylitis, right elbow: Secondary | ICD-10-CM

## 2015-12-11 DIAGNOSIS — M629 Disorder of muscle, unspecified: Secondary | ICD-10-CM

## 2015-12-11 DIAGNOSIS — M6289 Other specified disorders of muscle: Secondary | ICD-10-CM | POA: Insufficient documentation

## 2015-12-11 DIAGNOSIS — S86111D Strain of other muscle(s) and tendon(s) of posterior muscle group at lower leg level, right leg, subsequent encounter: Secondary | ICD-10-CM | POA: Diagnosis not present

## 2015-12-11 NOTE — Assessment & Plan Note (Signed)
Discussed which activities to do. Patient given the recommendation of askling exercises. We discussed compression sleeve to change the fulcrum. Patient will come back and see me again in 4 weeks.

## 2015-12-11 NOTE — Patient Instructions (Signed)
Good to see you Great time on the last sprint! I would not make any lareg change to the medicines right now until after the race Nitro daily for 2 weeks then 3 times a week for 2 weeks then discontinue on the elbow For the elbow avoid underhand activity for another month Look up Hamstring askling exercises and do them as stated Consider a thigh compression sleeve See me again in 4-5 weeks if we need a tune up or send me a message.

## 2015-12-11 NOTE — Assessment & Plan Note (Signed)
Seem to be healing at this point. Encourage him to continue the same regimen. Patient will slowly titrate off the nitroglycerin. Patient wants to know if he can get off some other medications we discussed will make these changes after patient's race in [place] in 2 months.

## 2015-12-11 NOTE — Progress Notes (Signed)
[name] [address] [place], [place] [address] Phone: [phone] Subjective:     CC: right calf pain Follow-up right elbow pain New problem tightness in hamstrings  [id number] Paul [name] is a 65 y.o. male coming in with complaint of right calf pain. Patient was found to have more of a tear of the soleus muscle. Patient has been put on medications to improve blood flow to the area as well as wearing the compression sleeves. An states that he is having no pain or cramping at this time. Very happy and has not been this way for multiple years.  Regarding patient's right elbow. Was not have more of a medial epicondylitis. Seem to be acute on chronic. Patient though states that doing much better with the natural course of breath. States that he is 80% better. Not affecting his daily activities anymore. Still some mild discomfort if he picks anything more than 15 pounds up with this arm under hand. No numbness, no weakness.  Complaining of some tightness in the hamstrings bilaterally left greater than right. Patient had some bruising on the anterior aspect of his knee and did not know why. Wonder no if this is related. Had been doing a lot more hill workouts recently.  Past Medical History  Diagnosis Date  . Hyperlipidemia   . Spinal stenosis     WAS HAVING NECK PAIN -PT STATES NERVE ABLATION PROCEDURE 2012 AND PAIN HAS RESOLVED  . Spondylitis ([place])   . Depression     RESOLVED   Past Surgical History  Procedure Laterality Date  . Appendectomy  1962  . Tonsillectomy  1968  . Knee surgery  [place]    left  . Umbilical hernia repair  10/06/2012    Procedure: HERNIA REPAIR UMBILICAL ADULT;  Surgeon: Imogene Burn. Georgette Dover, MD;  Location: [facility];  Service: General;  Laterality: N/A;  . Insertion of mesh  10/06/2012    Procedure: INSERTION OF MESH;  Surgeon: Imogene Burn. Georgette Dover, MD;  Location: [facility];  Service: General;  Laterality: N/A;  . Hernia  repair     Social History   Social History  . Marital Status: Married    Spouse Name: N/A  . Number of Children: N/A  . Years of Education: N/A   Occupational History  . Not on file.   Social History Main Topics  . Smoking status: Never Smoker   . Smokeless tobacco: Not on file  . Alcohol Use: Yes     Comment: OCCAS ALCOHOL  . Drug Use: No  . Sexual Activity: Not on file   Other Topics Concern  . Not on file   Social History Narrative   Allergies  Allergen Reactions  . Crestor [Rosuvastatin]     MUSCLE CRAMPS  . Lipitor [Atorvastatin]     Liver toxicity       Family History  Problem Relation Age of Onset  . Stroke Mother   . Heart disease Father   . Cancer Sister     bone     Past medical history, social, surgical and family history all reviewed in electronic medical record.   Review of Systems: No headache, visual changes, nausea, vomiting, diarrhea, constipation, dizziness, abdominal pain, skin rash, fevers, chills, night sweats, weight loss, swollen lymph nodes, body aches, joint swelling, muscle aches, chest pain, shortness of breath, mood changes.   Objective Blood pressure 124/78, pulse 68, weight 163 lb (73.936 kg).  General: No apparent distress alert  and oriented x3 mood and affect normal, dressed appropriately.  HEENT: Pupils equal, extraocular movements intact  Respiratory: Patient's speak in full sentences and does not appear short of breath  Cardiovascular: No lower extremity edema, non tender, no erythema  Skin: Warm dry intact with no signs of infection or rash on extremities or on axial skeleton.  Abdomen: Soft nontender  Neuro: Cranial nerves II through XII are intact, neurovascularly intact in all extremities with 2+ DTRs and 2+ pulses.  Lymph: No lymphadenopathy of posterior or anterior cervical chain or axillae bilaterally.  Gait normal with good balance and coordination.  MSK:  Non tender with full range of motion and good stability and  symmetric strength and tone of shoulders, , wrist, hip, and ankles bilaterally.  Knee: Right Normal to inspection with no erythema or effusion or obvious bony abnormalities. Palpation normal with no warmth, joint line tenderness, patellar tenderness, or condyle tenderness.  Gastrocnemius is nontender ROM full in flexion and extension and lower leg rotation. Ligaments with solid consistent endpoints including ACL, PCL, LCL, MCL. Negative Mcmurray's, Apley's, and Thessalonian tests. Non painful patellar compression. Patellar glide with crepitus. Patient though does have tightness of the hamstrings bilaterally and pain over the left pes anserine area  Patellar and quadriceps tendons unremarkable. Hamstring and quadriceps strength is normal.  Contralateral side unremarkable today.   Elbow: Unremarkable to inspection. Range of motion full pronation, supination, flexion, extension. Strength is full to all of the above directions Stable to varus, valgus stress. Negative moving valgus stress test. T very minimal tenderness of the medial epicondylar region Ulnar nerve does not sublux. Negative cubital tunnel Tinel's. Contralateral elbow has very minimal same symptoms but significantly better than right side.  Musculoskeletal ultrasound was performed and interpreted by Charlann Boxer D.O.   Elbow:  Lateral epicondyle and common extensor tendon origin visualized.  No edema, effusions, or avulsions seen.  Radial head unremarkable and located in annular ligament Medial epicondyle and common flexor tendon origin visualized. Scarring noted still but no hypoechoic changes. No intersubstance tearing noted. No increase in Doppler flow. Olecranon and triceps insertion visualized and unremarkable without edema, effusion, or avulsion.  No signs olecranon bursitis. Images saved in internal hard drive  IMPRESSION:  Significant improvement of the medial epicondylitis     Impression and Recommendations:       This case required medical decision making of moderate complexity.

## 2015-12-11 NOTE — Assessment & Plan Note (Signed)
Improved and near completely healed at this time.

## 2015-12-21 DIAGNOSIS — M9903 Segmental and somatic dysfunction of lumbar region: Secondary | ICD-10-CM | POA: Diagnosis not present

## 2015-12-21 DIAGNOSIS — S39012A Strain of muscle, fascia and tendon of lower back, initial encounter: Secondary | ICD-10-CM | POA: Diagnosis not present

## 2015-12-21 DIAGNOSIS — M9902 Segmental and somatic dysfunction of thoracic region: Secondary | ICD-10-CM | POA: Diagnosis not present

## 2015-12-21 DIAGNOSIS — S29012A Strain of muscle and tendon of back wall of thorax, initial encounter: Secondary | ICD-10-CM | POA: Diagnosis not present

## 2015-12-21 DIAGNOSIS — S138XXA Sprain of joints and ligaments of other parts of neck, initial encounter: Secondary | ICD-10-CM | POA: Diagnosis not present

## 2015-12-21 DIAGNOSIS — M9901 Segmental and somatic dysfunction of cervical region: Secondary | ICD-10-CM | POA: Diagnosis not present

## 2016-01-15 ENCOUNTER — Ambulatory Visit: Payer: Medicare Other | Admitting: Family Medicine

## 2016-01-18 ENCOUNTER — Telehealth: Payer: Self-pay

## 2016-01-18 DIAGNOSIS — M9901 Segmental and somatic dysfunction of cervical region: Secondary | ICD-10-CM | POA: Diagnosis not present

## 2016-01-18 DIAGNOSIS — S29012A Strain of muscle and tendon of back wall of thorax, initial encounter: Secondary | ICD-10-CM | POA: Diagnosis not present

## 2016-01-18 DIAGNOSIS — S138XXA Sprain of joints and ligaments of other parts of neck, initial encounter: Secondary | ICD-10-CM | POA: Diagnosis not present

## 2016-01-18 DIAGNOSIS — M9902 Segmental and somatic dysfunction of thoracic region: Secondary | ICD-10-CM | POA: Diagnosis not present

## 2016-01-18 DIAGNOSIS — M9903 Segmental and somatic dysfunction of lumbar region: Secondary | ICD-10-CM | POA: Diagnosis not present

## 2016-01-18 DIAGNOSIS — S39012A Strain of muscle, fascia and tendon of lower back, initial encounter: Secondary | ICD-10-CM | POA: Diagnosis not present

## 2016-01-18 NOTE — Telephone Encounter (Signed)
Please advise patient is requesting refill on gabapentin

## 2016-01-23 ENCOUNTER — Ambulatory Visit (INDEPENDENT_AMBULATORY_CARE_PROVIDER_SITE_OTHER): Payer: Medicare Other | Admitting: Family Medicine

## 2016-01-23 ENCOUNTER — Encounter: Payer: Self-pay | Admitting: Family Medicine

## 2016-01-23 VITALS — BP 110/66 | HR 60 | Ht 71.0 in | Wt 157.0 lb

## 2016-01-23 DIAGNOSIS — M7701 Medial epicondylitis, right elbow: Secondary | ICD-10-CM

## 2016-01-23 DIAGNOSIS — M79662 Pain in left lower leg: Secondary | ICD-10-CM | POA: Diagnosis not present

## 2016-01-23 DIAGNOSIS — M629 Disorder of muscle, unspecified: Secondary | ICD-10-CM

## 2016-01-23 DIAGNOSIS — M6289 Other specified disorders of muscle: Secondary | ICD-10-CM

## 2016-01-23 MED ORDER — GABAPENTIN 100 MG PO CAPS: 200.0000 mg | ORAL_CAPSULE | Freq: Every day | ORAL | Status: DC

## 2016-01-23 NOTE — Patient Instructions (Signed)
Good to see you and good luck! Gabapentin I would continue and the pletal.  Continue the compression  Love the papya enzymes and take with the meal Consider creatine 1 gram in your water and drink it about 15 minutes before exercises and during possibly  Magnesium phosphate over the counter can help with some cramping as well. Send me a message after the race and tell me how you are doing.

## 2016-01-23 NOTE — Progress Notes (Signed)
Pre visit review using our clinic review tool, if applicable. No additional management support is needed unless otherwise documented below in the visit note. 

## 2016-01-23 NOTE — Assessment & Plan Note (Signed)
Improving at this time.

## 2016-01-23 NOTE — Assessment & Plan Note (Signed)
Near completely resolved at this time.

## 2016-01-23 NOTE — Progress Notes (Signed)
[name] [place] [address] [place], [place] [address] Phone: [phone] Subjective:     CC: right calf painFollow-up Follow-up right elbow pain   [id number] Paul [name] is a 65 y.o. male coming in with complaint of right calf pain. Patient was found to have more of a tear of the soleus muscle. Patient though had been doing very well. Started having some mild cramping of the left calf again. States that this is been better for quite some time but seemed to be getting a little bit worse again. Patient does not know exactly why is doing better. Still seems to be the intensity when he tries to increase again some trouble. States when he rests for 2-3 days the pain is completely away and he is able to start running again. Continues of the compression and the home exercises regularly.  Regarding patient's right elbow. Was not have more of a medial epicondylitis. States his long as he makes sure he does not lift underhand he does well. States that he is 90% better. Not affecting him when he works out.  Patient was seen previously for hamstring problems and his notices that he is improving as well.  Past Medical History  Diagnosis Date  . Hyperlipidemia   . Spinal stenosis     WAS HAVING NECK PAIN -PT STATES NERVE ABLATION PROCEDURE 2012 AND PAIN HAS RESOLVED  . Spondylitis ([place])   . Depression     RESOLVED   Past Surgical History  Procedure Laterality Date  . Appendectomy  1962  . Tonsillectomy  1968  . Knee surgery  [place]    left  . Umbilical hernia repair  10/06/2012    Procedure: HERNIA REPAIR UMBILICAL ADULT;  Surgeon: Imogene Burn. Georgette Dover, MD;  Location: [facility];  Service: General;  Laterality: N/A;  . Insertion of mesh  10/06/2012    Procedure: INSERTION OF MESH;  Surgeon: Imogene Burn. Georgette Dover, MD;  Location: [facility];  Service: General;  Laterality: N/A;  . Hernia repair     Social History   Social History  . Marital Status: Married   Spouse Name: N/A  . Number of Children: N/A  . Years of Education: N/A   Occupational History  . Not on file.   Social History Main Topics  . Smoking status: Never Smoker   . Smokeless tobacco: Not on file  . Alcohol Use: Yes     Comment: OCCAS ALCOHOL  . Drug Use: No  . Sexual Activity: Not on file   Other Topics Concern  . Not on file   Social History Narrative   Allergies  Allergen Reactions  . Crestor [Rosuvastatin]     MUSCLE CRAMPS  . Lipitor [Atorvastatin]     Liver toxicity       Family History  Problem Relation Age of Onset  . Stroke Mother   . Heart disease Father   . Cancer Sister     bone     Past medical history, social, surgical and family history all reviewed in electronic medical record.   Review of Systems: No headache, visual changes, nausea, vomiting, diarrhea, constipation, dizziness, abdominal pain, skin rash, fevers, chills, night sweats, weight loss, swollen lymph nodes, body aches, joint swelling, muscle aches, chest pain, shortness of breath, mood changes.   Objective Blood pressure 110/66, pulse 60, height 5\' 11"  (1.803 m), weight 157 lb (71.215 kg), SpO2 99 %.  General: No apparent distress alert and oriented x3 mood and  affect normal, dressed appropriately.  HEENT: Pupils equal, extraocular movements intact  Respiratory: Patient's speak in full sentences and does not appear short of breath  Cardiovascular: No lower extremity edema, non tender, no erythema  Skin: Warm dry intact with no signs of infection or rash on extremities or on axial skeleton.  Abdomen: Soft nontender  Neuro: Cranial nerves II through XII are intact, neurovascularly intact in all extremities with 2+ DTRs and 2+ pulses.  Lymph: No lymphadenopathy of posterior or anterior cervical chain or axillae bilaterally.  Gait normal with good balance and coordination.  MSK:  Non tender with full range of motion and good stability and symmetric strength and tone of shoulders, ,  wrist, hip, and ankles bilaterally.  Knee: Right Normal to inspection with no erythema or effusion or obvious bony abnormalities. Palpation normal with no warmth, joint line tenderness, patellar tenderness, or condyle tenderness.  Gastrocnemius is nontender ROM full in flexion and extension and lower leg rotation. Ligaments with solid consistent endpoints including ACL, PCL, LCL, MCL. Negative Mcmurray's, Apley's, and Thessalonian tests. Non painful patellar compression. Patellar glide with crepitus. Significantly less tender over the pes anserine area. Patient also has some tightness of the left hamstring compared to contralateral side.  Patellar and quadriceps tendons unremarkable. Hamstring and quadriceps strength is normal.   Left calf is nontender on exam today. Full strength of the ankle as well as the knee. Negative pain to compression.  Elbow: Right Unremarkable to inspection. Range of motion full pronation, supination, flexion, extension. Strength is full to all of the above directions Stable to varus, valgus stress. Negative moving valgus stress test. Nontender today Negative cubital tunnel Tinel's. Contralateral side unremarkable e.       Impression and Recommendations:     This case required medical decision making of moderate complexity.

## 2016-01-23 NOTE — Assessment & Plan Note (Signed)
Patient had some mild exacerbation. We discussed creatinine could be something that could benefit patient with a be more the intensity. We discussed also the amount of hydration as well as protein supplementation. Patient has made significant improvement overall. Continue the pletal for any vascular compromise a could be occurring as well as the gabapentin for neurologic component. Patient will continue with the compression with running. We discussed avoiding any's significant increase in intensity over the course the next several weeks again. Patient will come back and see me again in 4-6 weeks.

## 2016-01-30 DIAGNOSIS — S138XXA Sprain of joints and ligaments of other parts of neck, initial encounter: Secondary | ICD-10-CM | POA: Diagnosis not present

## 2016-01-30 DIAGNOSIS — M9903 Segmental and somatic dysfunction of lumbar region: Secondary | ICD-10-CM | POA: Diagnosis not present

## 2016-01-30 DIAGNOSIS — S39012A Strain of muscle, fascia and tendon of lower back, initial encounter: Secondary | ICD-10-CM | POA: Diagnosis not present

## 2016-01-30 DIAGNOSIS — M9902 Segmental and somatic dysfunction of thoracic region: Secondary | ICD-10-CM | POA: Diagnosis not present

## 2016-01-30 DIAGNOSIS — S29012A Strain of muscle and tendon of back wall of thorax, initial encounter: Secondary | ICD-10-CM | POA: Diagnosis not present

## 2016-01-30 DIAGNOSIS — M9901 Segmental and somatic dysfunction of cervical region: Secondary | ICD-10-CM | POA: Diagnosis not present

## 2016-01-31 ENCOUNTER — Telehealth: Payer: Self-pay | Admitting: Family Medicine

## 2016-01-31 NOTE — Telephone Encounter (Signed)
I am sorry but family in town and taking a little time.out.

## 2016-01-31 NOTE — Telephone Encounter (Signed)
Pt called in and said that is is leaving for [place] in the morning for a race and his calf is flaring up. He wants to know if dr Tamala Julian can work him in?

## 2016-01-31 NOTE — Telephone Encounter (Signed)
Discussed with pt

## 2016-02-14 DIAGNOSIS — S39012A Strain of muscle, fascia and tendon of lower back, initial encounter: Secondary | ICD-10-CM | POA: Diagnosis not present

## 2016-02-14 DIAGNOSIS — S29012A Strain of muscle and tendon of back wall of thorax, initial encounter: Secondary | ICD-10-CM | POA: Diagnosis not present

## 2016-02-14 DIAGNOSIS — S138XXA Sprain of joints and ligaments of other parts of neck, initial encounter: Secondary | ICD-10-CM | POA: Diagnosis not present

## 2016-02-14 DIAGNOSIS — M9902 Segmental and somatic dysfunction of thoracic region: Secondary | ICD-10-CM | POA: Diagnosis not present

## 2016-02-14 DIAGNOSIS — M9901 Segmental and somatic dysfunction of cervical region: Secondary | ICD-10-CM | POA: Diagnosis not present

## 2016-02-14 DIAGNOSIS — M9903 Segmental and somatic dysfunction of lumbar region: Secondary | ICD-10-CM | POA: Diagnosis not present

## 2016-02-15 ENCOUNTER — Ambulatory Visit (INDEPENDENT_AMBULATORY_CARE_PROVIDER_SITE_OTHER)
Admission: RE | Admit: 2016-02-15 | Discharge: 2016-02-15 | Disposition: A | Discharge status: Home / Self Care | Payer: Medicare Other | Source: Ambulatory Visit | Attending: Family Medicine | Admitting: Family Medicine

## 2016-02-15 ENCOUNTER — Encounter: Payer: Self-pay | Admitting: Family Medicine

## 2016-02-15 ENCOUNTER — Ambulatory Visit (INDEPENDENT_AMBULATORY_CARE_PROVIDER_SITE_OTHER): Payer: Medicare Other | Admitting: Family Medicine

## 2016-02-15 VITALS — BP 114/72 | HR 76 | Wt 158.0 lb

## 2016-02-15 DIAGNOSIS — M5442 Lumbago with sciatica, left side: Secondary | ICD-10-CM

## 2016-02-15 DIAGNOSIS — M79662 Pain in left lower leg: Secondary | ICD-10-CM

## 2016-02-15 DIAGNOSIS — M5416 Radiculopathy, lumbar region: Secondary | ICD-10-CM | POA: Insufficient documentation

## 2016-02-15 DIAGNOSIS — M629 Disorder of muscle, unspecified: Secondary | ICD-10-CM | POA: Diagnosis not present

## 2016-02-15 DIAGNOSIS — M5136 Other intervertebral disc degeneration, lumbar region: Secondary | ICD-10-CM | POA: Diagnosis not present

## 2016-02-15 DIAGNOSIS — M6289 Other specified disorders of muscle: Secondary | ICD-10-CM

## 2016-02-15 MED ORDER — GABAPENTIN 300 MG PO CAPS: 300.0000 mg | ORAL_CAPSULE | Freq: Every day | ORAL | Status: DC

## 2016-02-15 NOTE — Assessment & Plan Note (Signed)
Encouraged patient to do more the stretching. We discussed compression. Discussed which activities to avoid. Follow up in 4 weeks

## 2016-02-15 NOTE — Patient Instructions (Addendum)
Good to see you  New exercises for back after running Gabapentin 300mg  at night Do xrays downstairs today  Keep running like you are oding but I hope this helps Send me a message in 2 weeks and see me again in 4 weeks.

## 2016-02-15 NOTE — Assessment & Plan Note (Signed)
Patient appears to be doing fairly well with a lumbar radiculopathy possibly being within the differential. Patient's calf no longer has any signs of true tear but patient continues to have the symptoms. Has responded fairly well to gabapentin. Increased to 300 mg at night. Warned of potential side effects. We discussed the icing regimen. X-rays of the back ordered today and patient given home exercises to work on core strength and range of motion. Patient Paul [name] Pulling message in 2 weeks to tell me how we are doing and we will see him again in 4 weeks for further evaluation.

## 2016-02-15 NOTE — Progress Notes (Signed)
[name] [name], [name] [address] Phone: [phone] Subjective:     CC: right calf pain Follow-up F   [id number] Paul [name] is a 65 y.o. male coming in with complaint of right calf pain. Patient was found to have more of a tear of the soleus muscle. Had been doing very well. Was going to [place]. Unfortunate have the same spasm again that occurred. Seem to have been in the left calf this time. Has had these bilaterally intermittently. Patient states that he continues to do the protein supplements, home exercises, same medications including the gabapentin. Patient is also taking pletal.  No side effects to any of the medications. Mild back pain is really new finding.    Patient was seen previously for hamstring problems and has noticed increasing tightness as well. Seems to be getting more annoying. Patient thinks it could be because how he is running to try to avoid the pain.  Past Medical History  Diagnosis Date  . Hyperlipidemia   . Spinal stenosis     WAS HAVING NECK PAIN -PT STATES NERVE ABLATION PROCEDURE 2012 AND PAIN HAS RESOLVED  . Spondylitis ([place])   . Depression     RESOLVED   Past Surgical History  Procedure Laterality Date  . Appendectomy  1962  . Tonsillectomy  1968  . Knee surgery  [place]    left  . Umbilical hernia repair  10/06/2012    Procedure: HERNIA REPAIR UMBILICAL ADULT;  Surgeon: Imogene Burn. Georgette Dover, MD;  Location: [facility];  Service: General;  Laterality: N/A;  . Insertion of mesh  10/06/2012    Procedure: INSERTION OF MESH;  Surgeon: Imogene Burn. Georgette Dover, MD;  Location: [facility];  Service: General;  Laterality: N/A;  . Hernia repair     Social History   Social History  . Marital Status: Married    Spouse Name: N/A  . Number of Children: N/A  . Years of Education: N/A   Occupational History  . Not on file.   Social History Main Topics  . Smoking status: Never Smoker   . Smokeless  tobacco: Not on file  . Alcohol Use: Yes     Comment: OCCAS ALCOHOL  . Drug Use: No  . Sexual Activity: Not on file   Other Topics Concern  . Not on file   Social History Narrative   Allergies  Allergen Reactions  . Crestor [Rosuvastatin]     MUSCLE CRAMPS  . Lipitor [Atorvastatin]     Liver toxicity       Family History  Problem Relation Age of Onset  . Stroke Mother   . Heart disease Father   . Cancer Sister     bone     Past medical history, social, surgical and family history all reviewed in electronic medical record.   Review of Systems: No headache, visual changes, nausea, vomiting, diarrhea, constipation, dizziness, abdominal pain, skin rash, fevers, chills, night sweats, weight loss, swollen lymph nodes, body aches, joint swelling, muscle aches, chest pain, shortness of breath, mood changes.   Objective Blood pressure 114/72, pulse 76, weight 158 lb (71.668 kg).  General: No apparent distress alert and oriented x3 mood and affect normal, dressed appropriately.  HEENT: Pupils equal, extraocular movements intact  Respiratory: Patient's speak in full sentences and does not appear short of breath  Cardiovascular: No lower extremity edema, non tender, no erythema  Skin: Warm dry intact with no signs of infection  or rash on extremities or on axial skeleton.  Abdomen: Soft nontender  Neuro: Cranial nerves II through XII are intact, neurovascularly intact in all extremities with 2+ DTRs and 2+ pulses.  Lymph: No lymphadenopathy of posterior or anterior cervical chain or axillae bilaterally.  Gait Very mild antalgic gait  MSK:  Non tender with full range of motion and good stability and symmetric strength and tone of shoulders, , wrist, hip, and ankles bilaterally.  Knee: Right Normal to inspection with no erythema or effusion or obvious bony abnormalities. Palpation normal with no warmth, joint line tenderness, patellar tenderness, or condyle tenderness.  Gastrocnemius  is nontender ROM full in flexion and extension and lower leg rotation. Ligaments with solid consistent endpoints including ACL, PCL, LCL, MCL. Negative Mcmurray's, Apley's, and Thessalonian tests. Non painful patellar compression. Patellar glide with crepitus. Nontender today. Patellar and quadriceps tendons unremarkable. Hamstring and quadriceps strength is normal.   Left calf is nontender on exam today. Full strength of the ankle as well as the knee. Negative pain to compression.  Back Exam:  Inspection: Unremarkable  Motion: Flexion 45 deg, Extension 15 deg with discomfort, Side Bending to 45 deg bilaterally,  Rotation to 45 deg bilaterally  SLR laying: Negative  XSLR laying: Negative  Palpable tenderness: Tenderness over the paraspinal muscle trauma left side FABER: negative. Sensory change: Gross sensation intact to all lumbar and sacral dermatomes.  Reflexes: 2+ at both patellar tendons, 2+ at achilles tendons, Babinski's downgoing.  Strength at foot  Plantar-flexion: 5/5 Dorsi-flexion: 5/5 Eversion: 5/5 Inversion: 5/5  Leg strength  Quad: 5/5 Hamstring: 5/5 Hip flexor: 5/5 Hip abductors: 4/5 but symmetric         Impression and Recommendations:     This case required medical decision making of moderate complexity.

## 2016-02-15 NOTE — Assessment & Plan Note (Signed)
At this point we have addressed vascular, neurologic, abnormality in gait, as well as fatigue. Has made small progress as each way. I do think that this is more likely referred pain now. Patient is having a x-ray of the back. Possibly any type of arthritis at the L3-L4 level could be contributing and then we will need to consider advanced imaging a patient does not respond to the new conservative therapy.

## 2016-02-19 ENCOUNTER — Other Ambulatory Visit: Payer: Self-pay | Admitting: Family Medicine

## 2016-02-19 DIAGNOSIS — R252 Cramp and spasm: Secondary | ICD-10-CM

## 2016-02-26 ENCOUNTER — Ambulatory Visit (HOSPITAL_COMMUNITY)
Admission: RE | Admit: 2016-02-26 | Discharge: 2016-02-26 | Disposition: A | Discharge status: Home / Self Care | Payer: Medicare Other | Source: Ambulatory Visit | Attending: Cardiology | Admitting: Cardiology

## 2016-02-26 ENCOUNTER — Other Ambulatory Visit: Payer: Self-pay | Admitting: Family Medicine

## 2016-02-26 DIAGNOSIS — E785 Hyperlipidemia, unspecified: Secondary | ICD-10-CM | POA: Diagnosis not present

## 2016-02-26 DIAGNOSIS — R252 Cramp and spasm: Secondary | ICD-10-CM | POA: Diagnosis not present

## 2016-02-27 DIAGNOSIS — S138XXA Sprain of joints and ligaments of other parts of neck, initial encounter: Secondary | ICD-10-CM | POA: Diagnosis not present

## 2016-02-27 DIAGNOSIS — M9901 Segmental and somatic dysfunction of cervical region: Secondary | ICD-10-CM | POA: Diagnosis not present

## 2016-02-27 DIAGNOSIS — M9903 Segmental and somatic dysfunction of lumbar region: Secondary | ICD-10-CM | POA: Diagnosis not present

## 2016-02-27 DIAGNOSIS — S29012A Strain of muscle and tendon of back wall of thorax, initial encounter: Secondary | ICD-10-CM | POA: Diagnosis not present

## 2016-02-27 DIAGNOSIS — M9902 Segmental and somatic dysfunction of thoracic region: Secondary | ICD-10-CM | POA: Diagnosis not present

## 2016-02-27 DIAGNOSIS — S39012A Strain of muscle, fascia and tendon of lower back, initial encounter: Secondary | ICD-10-CM | POA: Diagnosis not present

## 2016-02-28 ENCOUNTER — Telehealth: Payer: Self-pay | Admitting: Family Medicine

## 2016-02-28 ENCOUNTER — Other Ambulatory Visit: Payer: Self-pay

## 2016-02-28 DIAGNOSIS — R252 Cramp and spasm: Secondary | ICD-10-CM

## 2016-02-28 MED ORDER — CILOSTAZOL 50 MG PO TABS: 25.0000 mg | ORAL_TABLET | Freq: Two times a day (BID) | ORAL | Status: DC

## 2016-02-28 NOTE — Telephone Encounter (Signed)
Pt request refill for cilostazol (PLETAL) 50 MG tablet to be send to [facility].

## 2016-02-29 DIAGNOSIS — M9903 Segmental and somatic dysfunction of lumbar region: Secondary | ICD-10-CM | POA: Diagnosis not present

## 2016-02-29 DIAGNOSIS — M9901 Segmental and somatic dysfunction of cervical region: Secondary | ICD-10-CM | POA: Diagnosis not present

## 2016-02-29 DIAGNOSIS — S39012A Strain of muscle, fascia and tendon of lower back, initial encounter: Secondary | ICD-10-CM | POA: Diagnosis not present

## 2016-02-29 DIAGNOSIS — S138XXA Sprain of joints and ligaments of other parts of neck, initial encounter: Secondary | ICD-10-CM | POA: Diagnosis not present

## 2016-02-29 DIAGNOSIS — M9902 Segmental and somatic dysfunction of thoracic region: Secondary | ICD-10-CM | POA: Diagnosis not present

## 2016-02-29 DIAGNOSIS — S29012A Strain of muscle and tendon of back wall of thorax, initial encounter: Secondary | ICD-10-CM | POA: Diagnosis not present

## 2016-02-29 NOTE — Telephone Encounter (Signed)
Prescription sent into pharmacy

## 2016-03-04 DIAGNOSIS — S39012A Strain of muscle, fascia and tendon of lower back, initial encounter: Secondary | ICD-10-CM | POA: Diagnosis not present

## 2016-03-04 DIAGNOSIS — M9901 Segmental and somatic dysfunction of cervical region: Secondary | ICD-10-CM | POA: Diagnosis not present

## 2016-03-04 DIAGNOSIS — S29012A Strain of muscle and tendon of back wall of thorax, initial encounter: Secondary | ICD-10-CM | POA: Diagnosis not present

## 2016-03-04 DIAGNOSIS — M9902 Segmental and somatic dysfunction of thoracic region: Secondary | ICD-10-CM | POA: Diagnosis not present

## 2016-03-04 DIAGNOSIS — S138XXA Sprain of joints and ligaments of other parts of neck, initial encounter: Secondary | ICD-10-CM | POA: Diagnosis not present

## 2016-03-04 DIAGNOSIS — M9903 Segmental and somatic dysfunction of lumbar region: Secondary | ICD-10-CM | POA: Diagnosis not present

## 2016-03-06 ENCOUNTER — Ambulatory Visit (INDEPENDENT_AMBULATORY_CARE_PROVIDER_SITE_OTHER): Payer: Medicare Other | Admitting: Family Medicine

## 2016-03-06 ENCOUNTER — Encounter: Payer: Self-pay | Admitting: Family Medicine

## 2016-03-06 VITALS — BP 130/62 | HR 61 | Ht 71.0 in | Wt 160.0 lb

## 2016-03-06 DIAGNOSIS — M79662 Pain in left lower leg: Secondary | ICD-10-CM | POA: Diagnosis not present

## 2016-03-06 DIAGNOSIS — I8393 Asymptomatic varicose veins of bilateral lower extremities: Secondary | ICD-10-CM | POA: Diagnosis not present

## 2016-03-06 DIAGNOSIS — I8390 Asymptomatic varicose veins of unspecified lower extremity: Secondary | ICD-10-CM | POA: Insufficient documentation

## 2016-03-06 NOTE — Patient Instructions (Signed)
Please check with vein specialist.  If we do not think that this would be beneficial and possible EMG or MRI of the leg and or back would be next steps.

## 2016-03-06 NOTE — Assessment & Plan Note (Addendum)
I do think that this is multifactorial. There is a possibility for a lumbar radiculopathy the patient is much more point tender. We have done ultrasound for which does show varicose vein in the area that could be contribute in. This could be given a pseudo-compartment syndrome. I do not feel that that is such through exertional compartment syndrome. Patient has had injuries to the calf and has some mild scar tissue that could also be contributing. Has responded to most conservative therapy but never completely resolved. At this point encourage patient to discuss with a vein specialist to see if any sclerotherapy or stripping could make improvement. Encourage him to continue all other conservative therapy. Discussed icing. Continue vitamins. Follow-up more on an as-needed basis. Spent  25 minutes with patient face-to-face and had greater than 50% of counseling including as described above in assessment and plan.

## 2016-03-06 NOTE — Progress Notes (Signed)
[name] [place], [place] [address] Phone: [phone] Subjective:     CC: calf pain Follow-up    [id number] DEAVON DIEBEL is a 65 y.o. male coming in with complaint of Roderick left calf pain. Has been seen for both left and right calf pain. 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 Continues on when he tries to get [place] than a 9 minute mile has severe pain in the posterior medial aspect of the calf mostly on the left side now. Anytime he tries to run with his midfoot he has trouble. Continues on the gabapentin, sometimes muscle relaxer, as well as pletal.,  Has been wearing compression sleeves for greater than 3 months with some mild improvement. Sometimes can be pain free but once again when he tries to increase activity it seems to worsen. Patient has done formal physical therapy, physical manipulation techniques, icing, topical anti-inflammatories in the medications above. Continues to have difficulty.  Workup for patient's back showed some mild degenerative disc at L4-L5 and facet hypertrophy at L5-S1 patient was not have any back pain.    Past Medical History  Diagnosis Date  . Hyperlipidemia   . Spinal stenosis     WAS HAVING NECK PAIN -PT STATES NERVE ABLATION PROCEDURE 2012 AND PAIN HAS RESOLVED  . Spondylitis ([place])   . Depression     RESOLVED   Past Surgical History  Procedure Laterality Date  . Appendectomy  1962  . Tonsillectomy  1968  . Knee surgery  [place]    left  . Umbilical hernia repair  10/06/2012    Procedure: HERNIA REPAIR UMBILICAL ADULT;  Surgeon: Imogene Burn. Georgette Dover, MD;  [facility];  Service: General;  Laterality: N/A;  . Insertion of mesh  10/06/2012    Procedure: INSERTION OF MESH;  Surgeon: Imogene Burn. Georgette Dover, MD;  [facility]: [facility];  Service: General;  Laterality: N/A;  . Hernia repair     Social History   Social History  . Marital Status: Married    Spouse Name: N/A  . Number of  Children: N/A  . Years of Education: N/A   Occupational History  . Not on file.   Social History Main Topics  . Smoking status: Never Smoker   . Smokeless tobacco: Not on file  . Alcohol Use: Yes     Comment: OCCAS ALCOHOL  . Drug Use: No  . Sexual Activity: Not on file   Other Topics Concern  . Not on file   Social History Narrative   Allergies  Allergen Reactions  . Crestor [Rosuvastatin]     MUSCLE CRAMPS  . Lipitor [Atorvastatin]     Liver toxicity       Family History  Problem Relation Age of Onset  . Stroke Mother   . Heart disease Father   . Cancer Sister     bone     Past medical history, social, surgical and family history all reviewed in electronic medical record.   Review of Systems: No headache, visual changes, nausea, vomiting, diarrhea, constipation, dizziness, abdominal pain, skin rash, fevers, chills, night sweats, weight loss, swollen lymph nodes, body aches, joint swelling, muscle aches, chest pain, shortness of breath, mood changes.   Objective There were no vitals taken for this visit.  General: No apparent distress alert and oriented x3 mood and affect normal, dressed appropriately.  HEENT: Pupils equal, extraocular movements intact  Respiratory: Patient's speak in full sentences and does not appear  short of breath  Cardiovascular: No lower extremity edema, non tender, no erythema  Skin: Warm dry intact with no signs of infection or rash on extremities or on axial skeleton.  Abdomen: Soft nontender  Neuro: Cranial nerves II through XII are intact, neurovascularly intact in all extremities with 2+ DTRs and 2+ pulses.  Lymph: No lymphadenopathy of posterior or anterior cervical chain or axillae bilaterally.  Gait Very mild antalgic gait  MSK:  Non tender with full range of motion and good stability and symmetric strength and tone of shoulders, , wrist, hip, and ankles bilaterally.     Left calf is tender today is worse than previous exam.  Seems to be in the midportion of the calf.  Back Exam:  Inspection: Unremarkable  Motion: Flexion 45 deg, Extension 25 deg with discomfort, Side Bending to 45 deg bilaterally,  Rotation to 45 deg bilaterally improvement in range of motionSLR laying: Negative  XSLR laying: Negative  Palpable tenderness: Tenderness over the paraspinal muscle trauma left side FABER: negative. Sensory change: Gross sensation intact to all lumbar and sacral dermatomes.  Reflexes: 2+ at both patellar tendons, 2+ at achilles tendons, Babinski's downgoing.  Strength at foot  Plantar-flexion: 5/5 Dorsi-flexion: 5/5 Eversion: 5/5 Inversion: 5/5  Leg strength  Quad: 5/5 Hamstring: 5/5 Hip flexor: 5/5 Hip abductors: 4/5 but symmetric         Impression and Recommendations:     This case required medical decision making of moderate complexity.

## 2016-03-06 NOTE — Progress Notes (Signed)
Pre visit review using our clinic review tool, if applicable. No additional management support is needed unless otherwise documented below in the visit note. 

## 2016-03-11 ENCOUNTER — Ambulatory Visit: Payer: Medicare Other | Admitting: Family Medicine

## 2016-03-12 DIAGNOSIS — E291 Testicular hypofunction: Secondary | ICD-10-CM | POA: Diagnosis not present

## 2016-03-12 DIAGNOSIS — D518 Other vitamin B12 deficiency anemias: Secondary | ICD-10-CM | POA: Diagnosis not present

## 2016-03-12 DIAGNOSIS — E785 Hyperlipidemia, unspecified: Secondary | ICD-10-CM | POA: Diagnosis not present

## 2016-03-12 DIAGNOSIS — M79604 Pain in right leg: Secondary | ICD-10-CM | POA: Diagnosis not present

## 2016-03-12 DIAGNOSIS — R7301 Impaired fasting glucose: Secondary | ICD-10-CM | POA: Diagnosis not present

## 2016-03-12 DIAGNOSIS — E559 Vitamin D deficiency, unspecified: Secondary | ICD-10-CM | POA: Diagnosis not present

## 2016-03-13 DIAGNOSIS — M9902 Segmental and somatic dysfunction of thoracic region: Secondary | ICD-10-CM | POA: Diagnosis not present

## 2016-03-13 DIAGNOSIS — S138XXA Sprain of joints and ligaments of other parts of neck, initial encounter: Secondary | ICD-10-CM | POA: Diagnosis not present

## 2016-03-13 DIAGNOSIS — S29012A Strain of muscle and tendon of back wall of thorax, initial encounter: Secondary | ICD-10-CM | POA: Diagnosis not present

## 2016-03-13 DIAGNOSIS — S39012A Strain of muscle, fascia and tendon of lower back, initial encounter: Secondary | ICD-10-CM | POA: Diagnosis not present

## 2016-03-13 DIAGNOSIS — M9901 Segmental and somatic dysfunction of cervical region: Secondary | ICD-10-CM | POA: Diagnosis not present

## 2016-03-13 DIAGNOSIS — M9903 Segmental and somatic dysfunction of lumbar region: Secondary | ICD-10-CM | POA: Diagnosis not present

## 2016-03-18 DIAGNOSIS — I83812 Varicose veins of left lower extremities with pain: Secondary | ICD-10-CM | POA: Diagnosis not present

## 2016-03-27 DIAGNOSIS — S39012A Strain of muscle, fascia and tendon of lower back, initial encounter: Secondary | ICD-10-CM | POA: Diagnosis not present

## 2016-03-27 DIAGNOSIS — M9902 Segmental and somatic dysfunction of thoracic region: Secondary | ICD-10-CM | POA: Diagnosis not present

## 2016-03-27 DIAGNOSIS — S29012A Strain of muscle and tendon of back wall of thorax, initial encounter: Secondary | ICD-10-CM | POA: Diagnosis not present

## 2016-03-27 DIAGNOSIS — S138XXA Sprain of joints and ligaments of other parts of neck, initial encounter: Secondary | ICD-10-CM | POA: Diagnosis not present

## 2016-03-27 DIAGNOSIS — M9901 Segmental and somatic dysfunction of cervical region: Secondary | ICD-10-CM | POA: Diagnosis not present

## 2016-03-27 DIAGNOSIS — M9903 Segmental and somatic dysfunction of lumbar region: Secondary | ICD-10-CM | POA: Diagnosis not present

## 2016-04-04 DIAGNOSIS — S29012A Strain of muscle and tendon of back wall of thorax, initial encounter: Secondary | ICD-10-CM | POA: Diagnosis not present

## 2016-04-04 DIAGNOSIS — S39012A Strain of muscle, fascia and tendon of lower back, initial encounter: Secondary | ICD-10-CM | POA: Diagnosis not present

## 2016-04-04 DIAGNOSIS — M9902 Segmental and somatic dysfunction of thoracic region: Secondary | ICD-10-CM | POA: Diagnosis not present

## 2016-04-04 DIAGNOSIS — M9903 Segmental and somatic dysfunction of lumbar region: Secondary | ICD-10-CM | POA: Diagnosis not present

## 2016-04-04 DIAGNOSIS — S138XXA Sprain of joints and ligaments of other parts of neck, initial encounter: Secondary | ICD-10-CM | POA: Diagnosis not present

## 2016-04-04 DIAGNOSIS — M9901 Segmental and somatic dysfunction of cervical region: Secondary | ICD-10-CM | POA: Diagnosis not present

## 2016-04-10 DIAGNOSIS — I8312 Varicose veins of left lower extremity with inflammation: Secondary | ICD-10-CM | POA: Diagnosis not present

## 2016-04-18 DIAGNOSIS — M9901 Segmental and somatic dysfunction of cervical region: Secondary | ICD-10-CM | POA: Diagnosis not present

## 2016-04-18 DIAGNOSIS — M9902 Segmental and somatic dysfunction of thoracic region: Secondary | ICD-10-CM | POA: Diagnosis not present

## 2016-04-18 DIAGNOSIS — S138XXA Sprain of joints and ligaments of other parts of neck, initial encounter: Secondary | ICD-10-CM | POA: Diagnosis not present

## 2016-04-18 DIAGNOSIS — S29012A Strain of muscle and tendon of back wall of thorax, initial encounter: Secondary | ICD-10-CM | POA: Diagnosis not present

## 2016-04-18 DIAGNOSIS — S39012A Strain of muscle, fascia and tendon of lower back, initial encounter: Secondary | ICD-10-CM | POA: Diagnosis not present

## 2016-04-18 DIAGNOSIS — M9903 Segmental and somatic dysfunction of lumbar region: Secondary | ICD-10-CM | POA: Diagnosis not present

## 2016-04-23 DIAGNOSIS — I8312 Varicose veins of left lower extremity with inflammation: Secondary | ICD-10-CM | POA: Diagnosis not present

## 2016-04-30 DIAGNOSIS — I8312 Varicose veins of left lower extremity with inflammation: Secondary | ICD-10-CM | POA: Diagnosis not present

## 2016-05-08 DIAGNOSIS — I8312 Varicose veins of left lower extremity with inflammation: Secondary | ICD-10-CM | POA: Diagnosis not present

## 2016-05-27 DIAGNOSIS — S39012A Strain of muscle, fascia and tendon of lower back, initial encounter: Secondary | ICD-10-CM | POA: Diagnosis not present

## 2016-05-27 DIAGNOSIS — S138XXA Sprain of joints and ligaments of other parts of neck, initial encounter: Secondary | ICD-10-CM | POA: Diagnosis not present

## 2016-05-27 DIAGNOSIS — M9903 Segmental and somatic dysfunction of lumbar region: Secondary | ICD-10-CM | POA: Diagnosis not present

## 2016-05-27 DIAGNOSIS — M9902 Segmental and somatic dysfunction of thoracic region: Secondary | ICD-10-CM | POA: Diagnosis not present

## 2016-05-27 DIAGNOSIS — M9901 Segmental and somatic dysfunction of cervical region: Secondary | ICD-10-CM | POA: Diagnosis not present

## 2016-05-27 DIAGNOSIS — S29012A Strain of muscle and tendon of back wall of thorax, initial encounter: Secondary | ICD-10-CM | POA: Diagnosis not present

## 2016-06-04 ENCOUNTER — Other Ambulatory Visit: Payer: Self-pay

## 2016-06-04 ENCOUNTER — Ambulatory Visit (INDEPENDENT_AMBULATORY_CARE_PROVIDER_SITE_OTHER): Payer: Medicare Other | Admitting: Family Medicine

## 2016-06-04 ENCOUNTER — Encounter: Payer: Self-pay | Admitting: Family Medicine

## 2016-06-04 VITALS — BP 118/72 | HR 60 | Wt 164.0 lb

## 2016-06-04 DIAGNOSIS — M79662 Pain in left lower leg: Secondary | ICD-10-CM | POA: Diagnosis not present

## 2016-06-04 NOTE — Progress Notes (Signed)
[name] [place] [place], [place] [address] Phone: [phone] Subjective:     CC: calf pain Follow-up    [id number] Paul [name] is a 65 y.o. male coming in with complaint of left calf pain. Has been seen for both left and right calf pain. 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 Patient continued to have difficulty. Patient was sent to vascular for further evaluation. Did have treatments for running venous hypertension of the left lower extremity. Patient did have treatment for this venous insufficiency. States that it was helping significant limp. Was running very good. Patient though unfortunately started having pain a little more distal to this area. Patient states it happened on a run. Patient states since then and even just some discomfort when he is walking. Seems to be very localized. Does not radiate down the leg. No numbness. No swelling. Has not tried any significant home modalities at this point. Patient is trying to qualify for a more competitive triathlon.       Past Medical History  Diagnosis Date  . Hyperlipidemia   . Spinal stenosis     WAS HAVING NECK PAIN -PT STATES NERVE ABLATION PROCEDURE 2012 AND PAIN HAS RESOLVED  . Spondylitis ([place])   . Depression     RESOLVED   Past Surgical History  Procedure Laterality Date  . Appendectomy  1962  . Tonsillectomy  1968  . Knee surgery  [place]    left  . Umbilical hernia repair  10/06/2012    Procedure: HERNIA REPAIR UMBILICAL ADULT;  Surgeon: Imogene Burn. Georgette Dover, MD;  [facility];  Service: General;  Laterality: N/A;  . Insertion of mesh  10/06/2012    Procedure: INSERTION OF MESH;  Surgeon: Imogene Burn. Georgette Dover, MD;  Location: [facility];  Service: General;  Laterality: N/A;  . Hernia repair     Social History   Social History  . Marital Status: Married    Spouse Name: N/A  . Number of Children: N/A  . Years of Education: N/A   Occupational History  . Not  on file.   Social History Main Topics  . Smoking status: Never Smoker   . Smokeless tobacco: Not on file  . Alcohol Use: Yes     Comment: OCCAS ALCOHOL  . Drug Use: No  . Sexual Activity: Not on file   Other Topics Concern  . Not on file   Social History Narrative   Allergies  Allergen Reactions  . Crestor [Rosuvastatin]     MUSCLE CRAMPS  . Lipitor [Atorvastatin]     Liver toxicity       Family History  Problem Relation Age of Onset  . Stroke Mother   . Heart disease Father   . Cancer Sister     bone     Past medical history, social, surgical and family history all reviewed in electronic medical record.   Review of Systems: No headache, visual changes, nausea, vomiting, diarrhea, constipation, dizziness, abdominal pain, skin rash, fevers, chills, night sweats, weight loss, swollen lymph nodes, body aches, joint swelling, muscle aches, chest pain, shortness of breath, mood changes.   Objective Blood pressure 118/72, pulse 60, weight 164 lb (74.39 kg).  General: No apparent distress alert and oriented x3 mood and affect normal, dressed appropriately.  HEENT: Pupils equal, extraocular movements intact  Respiratory: Patient's speak in full sentences and does not appear short of breath  Cardiovascular: No lower extremity edema, non tender, no erythema  Skin: Warm dry intact with no signs of infection or rash on extremities or on axial skeleton.  Abdomen: Soft nontender  Neuro: Cranial nerves II through XII are intact, neurovascularly intact in all extremities with 2+ DTRs and 2+ pulses.  Lymph: No lymphadenopathy of posterior or anterior cervical chain or axillae bilaterally.  Gait Normal MSK:  Non tender with full range of motion and good stability and symmetric strength and tone of shoulders, , wrist, hip, and ankles bilaterally.    Left calf minimally tender right at the Achilles medial gastroc juncture. Patient has full strength of the ankle. Neurovascularly intact  distally. Patient can stand on his toes with very mild discomfort. No pain with compression. Negative Thompson. Deep tendon reflexes intact.  Limited musculoskeletal ultrasound interpreted by Hulan Saas, M Limited ultrasound patient's left Does show that patient does have a calf strain with neovascularization. Patient still has some very mild abnormal vascularization in this area. Seems to go within the muscle belly. Impression: Calf strain with mild neovascularization            Impression and Recommendations:     This case required medical decision making of moderate complexity.

## 2016-06-04 NOTE — Patient Instructions (Signed)
Good to see you  Ice is your friend Compression with running [email or web address] for heel lifts 1/8 of inch or 1/4 inch would be good.  Start exercises again 2-3 times a week.  Decrease running 50% and increase 15% a week.  Stay hydrated.  I would start the nitro again.  See me again in 3 weeks or send a message.

## 2016-06-04 NOTE — Assessment & Plan Note (Signed)
The patient does have a calf strain. This seems different than his previous one. Patient did have the lower extremity hypertension that seemed to resolve after his intervention. Patient continues to have pain though there is a chance that this distally seems to be giving him some difficulty as well. Patient has stopped all his other medications. We did discuss that if worsening symptoms consider to restart the Pletal as well as the gabapentin. Hopefully that this would not be necessary. Patient will get a heel lift, home exercises and the compression regularly. Patient will follow-up with me again in 3-4 weeks.  Spent  25 minutes with patient face-to-face and had greater than 50% of counseling including as described above in assessment and plan.

## 2016-06-22 NOTE — Progress Notes (Deleted)
[name] [place], [place] [address] Phone: [phone] Subjective:     CC: calf pain Follow-up    [id number]  Paul CORRIS is a 65 y.o. male coming in with complaint of left calf pain. Has been seen for both left and right calf pain. 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 Patient continued to have difficulty. Patient was sent to vascular for further evaluation. Did have treatments for running venous hypertension of the left lower extremity. Patient did have treatment for this venous insufficiency. Patient had an exacerbation of muscle on the left sign. Patient feels that this is a different pain. Patient was given different home exercises and to increase slowly over the course of time. Patient states      Past Medical History:  Diagnosis Date  . Depression    RESOLVED  . Hyperlipidemia   . Spinal stenosis    WAS HAVING NECK PAIN -PT STATES NERVE ABLATION PROCEDURE 2012 AND PAIN HAS RESOLVED  . Spondylitis [facility])    Past Surgical History:  Procedure Laterality Date  . APPENDECTOMY  1962  . HERNIA REPAIR    . INSERTION OF MESH  10/06/2012   Procedure: INSERTION OF MESH;  Surgeon: Imogene Burn. Georgette Dover, MD;  Location: [facility];  Service: General;  Laterality: N/A;  . [place]   left  . TONSILLECTOMY  1968  . UMBILICAL HERNIA REPAIR  10/06/2012   Procedure: HERNIA REPAIR UMBILICAL ADULT;  Surgeon: Imogene Burn. Georgette Dover, MD;  Location: [facility];  Service: General;  Laterality: N/A;   Social History   Social History  . Marital status: Married    Spouse name: N/A  . Number of children: N/A  . Years of education: N/A   Occupational History  . Not on file.   Social History Main Topics  . Smoking status: Never Smoker  . Smokeless tobacco: Not on file  . Alcohol use Yes     Comment: OCCAS ALCOHOL  . Drug use: No  . Sexual activity: Not on file   Other Topics Concern  . Not on file   Social History  Narrative  . No narrative on file   Allergies  Allergen Reactions  . Crestor [Rosuvastatin]     MUSCLE CRAMPS  . Lipitor [Atorvastatin]     Liver toxicity       Family History  Problem Relation Age of Onset  . Stroke Mother   . Heart disease Father   . Cancer Sister     bone     Past medical history, social, surgical and family history all reviewed in electronic medical record.   Review of Systems: No headache, visual changes, nausea, vomiting, diarrhea, constipation, dizziness, abdominal pain, skin rash, fevers, chills, night sweats, weight loss, swollen lymph nodes, body aches, joint swelling, muscle aches, chest pain, shortness of breath, mood changes.   Objective  There were no vitals taken for this visit.  General: No apparent distress alert and oriented x3 mood and affect normal, dressed appropriately.  HEENT: Pupils equal, extraocular movements intact  Respiratory: Patient's speak in full sentences and does not appear short of breath  Cardiovascular: No lower extremity edema, non tender, no erythema  Skin: Warm dry intact with no signs of infection or rash on extremities or on axial skeleton.  Abdomen: Soft nontender  Neuro: Cranial nerves II through XII are intact, neurovascularly intact in all extremities with 2+ DTRs and 2+ pulses.  Lymph: No  lymphadenopathy of posterior or anterior cervical chain or axillae bilaterally.  Gait Normal MSK:  Non tender with full range of motion and good stability and symmetric strength and tone of shoulders, , wrist, hip, and ankles bilaterally.    Left calf minimally tender right at the Achilles medial gastroc juncture. Patient has full strength of the ankle. Neurovascularly intact distally. Patient can stand on his toes with very mild discomfort. No pain with compression. Negative Thompson. Deep tendon reflexes intact.  Limited musculoskeletal ultrasound interpreted by [name] Limited ultrasound patient's left Does show that  patient does have a calf strain with neovascularization. Patient still has some very mild abnormal vascularization in this area. Seems to go within the muscle belly. Impression: Calf strain with mild neovascularization            Impression and Recommendations:     This case required medical decision making of moderate complexity.

## 2016-06-24 ENCOUNTER — Ambulatory Visit: Payer: Medicare Other | Admitting: Family Medicine

## 2016-06-24 DIAGNOSIS — S29012A Strain of muscle and tendon of back wall of thorax, initial encounter: Secondary | ICD-10-CM | POA: Diagnosis not present

## 2016-06-24 DIAGNOSIS — S39012A Strain of muscle, fascia and tendon of lower back, initial encounter: Secondary | ICD-10-CM | POA: Diagnosis not present

## 2016-06-24 DIAGNOSIS — M9901 Segmental and somatic dysfunction of cervical region: Secondary | ICD-10-CM | POA: Diagnosis not present

## 2016-06-24 DIAGNOSIS — M9903 Segmental and somatic dysfunction of lumbar region: Secondary | ICD-10-CM | POA: Diagnosis not present

## 2016-06-24 DIAGNOSIS — M9902 Segmental and somatic dysfunction of thoracic region: Secondary | ICD-10-CM | POA: Diagnosis not present

## 2016-06-24 DIAGNOSIS — S138XXA Sprain of joints and ligaments of other parts of neck, initial encounter: Secondary | ICD-10-CM | POA: Diagnosis not present

## 2016-07-08 DIAGNOSIS — S39012A Strain of muscle, fascia and tendon of lower back, initial encounter: Secondary | ICD-10-CM | POA: Diagnosis not present

## 2016-07-08 DIAGNOSIS — S29012A Strain of muscle and tendon of back wall of thorax, initial encounter: Secondary | ICD-10-CM | POA: Diagnosis not present

## 2016-07-08 DIAGNOSIS — M9902 Segmental and somatic dysfunction of thoracic region: Secondary | ICD-10-CM | POA: Diagnosis not present

## 2016-07-08 DIAGNOSIS — M9903 Segmental and somatic dysfunction of lumbar region: Secondary | ICD-10-CM | POA: Diagnosis not present

## 2016-07-08 DIAGNOSIS — S138XXA Sprain of joints and ligaments of other parts of neck, initial encounter: Secondary | ICD-10-CM | POA: Diagnosis not present

## 2016-07-08 DIAGNOSIS — M9901 Segmental and somatic dysfunction of cervical region: Secondary | ICD-10-CM | POA: Diagnosis not present

## 2016-07-30 DIAGNOSIS — M9901 Segmental and somatic dysfunction of cervical region: Secondary | ICD-10-CM | POA: Diagnosis not present

## 2016-07-30 DIAGNOSIS — M9902 Segmental and somatic dysfunction of thoracic region: Secondary | ICD-10-CM | POA: Diagnosis not present

## 2016-07-30 DIAGNOSIS — S29012A Strain of muscle and tendon of back wall of thorax, initial encounter: Secondary | ICD-10-CM | POA: Diagnosis not present

## 2016-07-30 DIAGNOSIS — M9903 Segmental and somatic dysfunction of lumbar region: Secondary | ICD-10-CM | POA: Diagnosis not present

## 2016-07-30 DIAGNOSIS — S39012A Strain of muscle, fascia and tendon of lower back, initial encounter: Secondary | ICD-10-CM | POA: Diagnosis not present

## 2016-07-30 DIAGNOSIS — S138XXA Sprain of joints and ligaments of other parts of neck, initial encounter: Secondary | ICD-10-CM | POA: Diagnosis not present

## 2016-08-09 DIAGNOSIS — Z23 Encounter for immunization: Secondary | ICD-10-CM | POA: Diagnosis not present

## 2016-09-03 DIAGNOSIS — M9902 Segmental and somatic dysfunction of thoracic region: Secondary | ICD-10-CM | POA: Diagnosis not present

## 2016-09-03 DIAGNOSIS — M9903 Segmental and somatic dysfunction of lumbar region: Secondary | ICD-10-CM | POA: Diagnosis not present

## 2016-09-03 DIAGNOSIS — S39012A Strain of muscle, fascia and tendon of lower back, initial encounter: Secondary | ICD-10-CM | POA: Diagnosis not present

## 2016-09-03 DIAGNOSIS — M9901 Segmental and somatic dysfunction of cervical region: Secondary | ICD-10-CM | POA: Diagnosis not present

## 2016-09-03 DIAGNOSIS — S138XXA Sprain of joints and ligaments of other parts of neck, initial encounter: Secondary | ICD-10-CM | POA: Diagnosis not present

## 2016-09-03 DIAGNOSIS — S29012A Strain of muscle and tendon of back wall of thorax, initial encounter: Secondary | ICD-10-CM | POA: Diagnosis not present

## 2016-09-12 DIAGNOSIS — Z1211 Encounter for screening for malignant neoplasm of colon: Secondary | ICD-10-CM | POA: Diagnosis not present

## 2016-09-12 DIAGNOSIS — K5730 Diverticulosis of large intestine without perforation or abscess without bleeding: Secondary | ICD-10-CM | POA: Diagnosis not present

## 2016-09-17 DIAGNOSIS — M9901 Segmental and somatic dysfunction of cervical region: Secondary | ICD-10-CM | POA: Diagnosis not present

## 2016-09-17 DIAGNOSIS — S138XXA Sprain of joints and ligaments of other parts of neck, initial encounter: Secondary | ICD-10-CM | POA: Diagnosis not present

## 2016-09-17 DIAGNOSIS — S29012A Strain of muscle and tendon of back wall of thorax, initial encounter: Secondary | ICD-10-CM | POA: Diagnosis not present

## 2016-09-17 DIAGNOSIS — M9903 Segmental and somatic dysfunction of lumbar region: Secondary | ICD-10-CM | POA: Diagnosis not present

## 2016-09-17 DIAGNOSIS — S39012A Strain of muscle, fascia and tendon of lower back, initial encounter: Secondary | ICD-10-CM | POA: Diagnosis not present

## 2016-09-17 DIAGNOSIS — M9902 Segmental and somatic dysfunction of thoracic region: Secondary | ICD-10-CM | POA: Diagnosis not present

## 2016-09-25 DIAGNOSIS — I8312 Varicose veins of left lower extremity with inflammation: Secondary | ICD-10-CM | POA: Diagnosis not present

## 2016-09-26 DIAGNOSIS — E7212 Methylenetetrahydrofolate reductase deficiency: Secondary | ICD-10-CM | POA: Diagnosis not present

## 2016-09-26 DIAGNOSIS — E349 Endocrine disorder, unspecified: Secondary | ICD-10-CM | POA: Diagnosis not present

## 2016-09-26 DIAGNOSIS — D518 Other vitamin B12 deficiency anemias: Secondary | ICD-10-CM | POA: Diagnosis not present

## 2016-09-26 DIAGNOSIS — E7211 Homocystinuria: Secondary | ICD-10-CM | POA: Diagnosis not present

## 2016-09-26 DIAGNOSIS — E889 Metabolic disorder, unspecified: Secondary | ICD-10-CM | POA: Diagnosis not present

## 2016-09-26 DIAGNOSIS — E291 Testicular hypofunction: Secondary | ICD-10-CM | POA: Diagnosis not present

## 2016-09-26 DIAGNOSIS — J449 Chronic obstructive pulmonary disease, unspecified: Secondary | ICD-10-CM | POA: Diagnosis not present

## 2016-09-26 DIAGNOSIS — E559 Vitamin D deficiency, unspecified: Secondary | ICD-10-CM | POA: Diagnosis not present

## 2016-09-26 DIAGNOSIS — J42 Unspecified chronic bronchitis: Secondary | ICD-10-CM | POA: Diagnosis not present

## 2016-09-26 DIAGNOSIS — M791 Myalgia: Secondary | ICD-10-CM | POA: Diagnosis not present

## 2016-09-26 DIAGNOSIS — E7800 Pure hypercholesterolemia, unspecified: Secondary | ICD-10-CM | POA: Diagnosis not present

## 2016-09-26 DIAGNOSIS — E756 Lipid storage disorder, unspecified: Secondary | ICD-10-CM | POA: Diagnosis not present

## 2016-10-01 DIAGNOSIS — M9903 Segmental and somatic dysfunction of lumbar region: Secondary | ICD-10-CM | POA: Diagnosis not present

## 2016-10-01 DIAGNOSIS — S39012A Strain of muscle, fascia and tendon of lower back, initial encounter: Secondary | ICD-10-CM | POA: Diagnosis not present

## 2016-10-01 DIAGNOSIS — S29012A Strain of muscle and tendon of back wall of thorax, initial encounter: Secondary | ICD-10-CM | POA: Diagnosis not present

## 2016-10-01 DIAGNOSIS — M9901 Segmental and somatic dysfunction of cervical region: Secondary | ICD-10-CM | POA: Diagnosis not present

## 2016-10-01 DIAGNOSIS — S138XXA Sprain of joints and ligaments of other parts of neck, initial encounter: Secondary | ICD-10-CM | POA: Diagnosis not present

## 2016-10-01 DIAGNOSIS — M9902 Segmental and somatic dysfunction of thoracic region: Secondary | ICD-10-CM | POA: Diagnosis not present

## 2016-10-06 DIAGNOSIS — M791 Myalgia: Secondary | ICD-10-CM | POA: Diagnosis not present

## 2016-10-06 DIAGNOSIS — J42 Unspecified chronic bronchitis: Secondary | ICD-10-CM | POA: Diagnosis not present

## 2016-10-06 DIAGNOSIS — E7800 Pure hypercholesterolemia, unspecified: Secondary | ICD-10-CM | POA: Diagnosis not present

## 2016-10-06 DIAGNOSIS — D518 Other vitamin B12 deficiency anemias: Secondary | ICD-10-CM | POA: Diagnosis not present

## 2016-10-14 DIAGNOSIS — Z23 Encounter for immunization: Secondary | ICD-10-CM | POA: Diagnosis not present

## 2016-10-14 DIAGNOSIS — Z0000 Encounter for general adult medical examination without abnormal findings: Secondary | ICD-10-CM | POA: Diagnosis not present

## 2016-10-14 DIAGNOSIS — Z1159 Encounter for screening for other viral diseases: Secondary | ICD-10-CM | POA: Diagnosis not present

## 2016-10-14 DIAGNOSIS — M542 Cervicalgia: Secondary | ICD-10-CM | POA: Diagnosis not present

## 2016-10-14 DIAGNOSIS — E291 Testicular hypofunction: Secondary | ICD-10-CM | POA: Diagnosis not present

## 2016-10-14 DIAGNOSIS — Z125 Encounter for screening for malignant neoplasm of prostate: Secondary | ICD-10-CM | POA: Diagnosis not present

## 2016-10-14 DIAGNOSIS — E785 Hyperlipidemia, unspecified: Secondary | ICD-10-CM | POA: Diagnosis not present

## 2016-10-15 DIAGNOSIS — S39012A Strain of muscle, fascia and tendon of lower back, initial encounter: Secondary | ICD-10-CM | POA: Diagnosis not present

## 2016-10-15 DIAGNOSIS — M9902 Segmental and somatic dysfunction of thoracic region: Secondary | ICD-10-CM | POA: Diagnosis not present

## 2016-10-15 DIAGNOSIS — S29012A Strain of muscle and tendon of back wall of thorax, initial encounter: Secondary | ICD-10-CM | POA: Diagnosis not present

## 2016-10-15 DIAGNOSIS — S138XXA Sprain of joints and ligaments of other parts of neck, initial encounter: Secondary | ICD-10-CM | POA: Diagnosis not present

## 2016-10-15 DIAGNOSIS — M9901 Segmental and somatic dysfunction of cervical region: Secondary | ICD-10-CM | POA: Diagnosis not present

## 2016-10-15 DIAGNOSIS — M9903 Segmental and somatic dysfunction of lumbar region: Secondary | ICD-10-CM | POA: Diagnosis not present

## 2016-11-06 DIAGNOSIS — M9903 Segmental and somatic dysfunction of lumbar region: Secondary | ICD-10-CM | POA: Diagnosis not present

## 2016-11-06 DIAGNOSIS — S29012A Strain of muscle and tendon of back wall of thorax, initial encounter: Secondary | ICD-10-CM | POA: Diagnosis not present

## 2016-11-06 DIAGNOSIS — S39012A Strain of muscle, fascia and tendon of lower back, initial encounter: Secondary | ICD-10-CM | POA: Diagnosis not present

## 2016-11-06 DIAGNOSIS — M9901 Segmental and somatic dysfunction of cervical region: Secondary | ICD-10-CM | POA: Diagnosis not present

## 2016-11-06 DIAGNOSIS — S138XXA Sprain of joints and ligaments of other parts of neck, initial encounter: Secondary | ICD-10-CM | POA: Diagnosis not present

## 2016-11-06 DIAGNOSIS — M9902 Segmental and somatic dysfunction of thoracic region: Secondary | ICD-10-CM | POA: Diagnosis not present

## 2016-11-21 DIAGNOSIS — S29012A Strain of muscle and tendon of back wall of thorax, initial encounter: Secondary | ICD-10-CM | POA: Diagnosis not present

## 2016-11-21 DIAGNOSIS — M9902 Segmental and somatic dysfunction of thoracic region: Secondary | ICD-10-CM | POA: Diagnosis not present

## 2016-11-21 DIAGNOSIS — M9903 Segmental and somatic dysfunction of lumbar region: Secondary | ICD-10-CM | POA: Diagnosis not present

## 2016-11-21 DIAGNOSIS — S39012A Strain of muscle, fascia and tendon of lower back, initial encounter: Secondary | ICD-10-CM | POA: Diagnosis not present

## 2016-11-21 DIAGNOSIS — S138XXA Sprain of joints and ligaments of other parts of neck, initial encounter: Secondary | ICD-10-CM | POA: Diagnosis not present

## 2016-11-21 DIAGNOSIS — M9901 Segmental and somatic dysfunction of cervical region: Secondary | ICD-10-CM | POA: Diagnosis not present

## 2016-11-25 DIAGNOSIS — J209 Acute bronchitis, unspecified: Secondary | ICD-10-CM | POA: Diagnosis not present

## 2016-11-27 ENCOUNTER — Ambulatory Visit: Payer: Medicare Other | Admitting: Family Medicine

## 2016-12-01 NOTE — Progress Notes (Signed)
[name] [place], [place] [address] Phone: [phone] Subjective:    I'm seeing this patient by the request  of:  Paul Frees, MD   CC: left wrist pain    [id number]  Paul [name] is a 66 y.o. male coming in with complaint of left wrist pain. Patient fell approximately 10 days ago. Patient states syndrome with severe pain and swelling immediately. Still having pain over the ulnar aspect. Wearing a brace. Patient rates the severity of pain a 7 out of 10 with certain movements. Patient denies any numbness. Patient states that certain activities seem does not have any pain. Patient states if he moves subsequently severe amount pain.    Past Medical History:  Diagnosis Date  . Depression    RESOLVED  . Hyperlipidemia   . Spinal stenosis    WAS HAVING NECK PAIN -PT STATES NERVE ABLATION PROCEDURE 2012 AND PAIN HAS RESOLVED  . Spondylitis [facility])    Past Surgical History:  Procedure Laterality Date  . APPENDECTOMY  1962  . HERNIA REPAIR    . INSERTION OF MESH  10/06/2012   Procedure: INSERTION OF MESH;  Surgeon: Imogene Burn. Georgette Dover, MD;  Location: [facility];  Service: General;  Laterality: N/A;  . [place]   left  . TONSILLECTOMY  1968  . UMBILICAL HERNIA REPAIR  10/06/2012   Procedure: HERNIA REPAIR UMBILICAL ADULT;  Surgeon: Imogene Burn. Georgette Dover, MD;  Location: [facility];  Service: General;  Laterality: N/A;   Social History   Social History  . Marital status: Married    Spouse name: N/A  . Number of children: N/A  . Years of education: N/A   Social History Main Topics  . Smoking status: Never Smoker  . Smokeless tobacco: Not on file  . Alcohol use Yes     Comment: OCCAS ALCOHOL  . Drug use: No  . Sexual activity: Not on file   Other Topics Concern  . Not on file   Social History Narrative  . No narrative on file   Allergies  Allergen Reactions  . Crestor [Rosuvastatin]     MUSCLE CRAMPS  . Lipitor  [Atorvastatin]     Liver toxicity   Family History  Problem Relation Age of Onset  . Stroke Mother   . Heart disease Father   . Cancer Sister     bone    Past medical history, social, surgical and family history all reviewed in electronic medical record.  No pertanent information unless stated regarding to the chief complaint.   Review of Systems:Review of systems updated and as accurate as of 12/01/16  No headache, visual changes, nausea, vomiting, diarrhea, constipation, dizziness, abdominal pain, skin rash, fevers, chills, night sweats, weight loss, swollen lymph nodes, body aches,chest pain, shortness of breath, mood changes.   Objective  There were no vitals taken for this visit. Systems examined below as of 12/01/16   General: No apparent distress alert and oriented x3 mood and affect normal, dressed appropriately.  HEENT: Pupils equal, extraocular movements intact  Respiratory: Patient's speak in full sentences and does not appear short of breath  Cardiovascular: No lower extremity edema, non tender, no erythema  Skin: Warm dry intact with no signs of infection or rash on extremities or on axial skeleton.  Abdomen: Soft nontender  Neuro: Cranial nerves II through XII are intact, neurovascularly intact in all extremities with 2+ DTRs and 2+ pulses.  Lymph: No lymphadenopathy  of posterior or anterior cervical chain or axillae bilaterally.  Gait normal with good balance and coordination.  MSK:  Non tender with full range of motion and good stability and symmetric strength and tone of shoulders, elbows,  hip, knee and ankles bilaterally.  Wrist: Left Inspection normal with no visible erythema or swelling. ROM smooth and normal with good flexion and extension and ulnar/radial deviation that is symmetrical with opposite wrist. Moderate to severe tenderness over the TFCC No snuffbox tenderness. No tenderness over Canal of [place]. Strength 5/5 in all directions without  pain. Negative Finkelstein, tinel's and phalens. Negative Watson's test.   MSK US performed of: Left wrist This study was ordered, performed, and interpreted by Charlann Boxer D.O.  Wrist: All extensor compartments visualized and tendons all normal in appearance without fraying, tears,  Trace effusion of the tendon sheaths noted. Patient though TFCC does have increasing Doppler flow and seems to have the articular sign possibly having a tear noted. Hypoechoic changes noted. No bony abnormalities noted.  IMPRESSION:  Questionable TFCC injury    Impression and Recommendations:     This case required medical decision making of moderate complexity.      Note: This dictation was prepared with Dragon dictation along with smaller phrase technology. Any transcriptional errors that result from this process are unintentional.

## 2016-12-02 ENCOUNTER — Ambulatory Visit: Payer: Self-pay

## 2016-12-02 ENCOUNTER — Ambulatory Visit (INDEPENDENT_AMBULATORY_CARE_PROVIDER_SITE_OTHER): Payer: Medicare Other | Admitting: Family Medicine

## 2016-12-02 ENCOUNTER — Encounter: Payer: Self-pay | Admitting: Family Medicine

## 2016-12-02 VITALS — BP 118/70 | HR 60 | Ht 70.0 in | Wt 161.0 lb

## 2016-12-02 DIAGNOSIS — S6980XA Other specified injuries of unspecified wrist, hand and finger(s), initial encounter: Secondary | ICD-10-CM | POA: Insufficient documentation

## 2016-12-02 DIAGNOSIS — S6982XA Other specified injuries of left wrist, hand and finger(s), initial encounter: Secondary | ICD-10-CM

## 2016-12-02 DIAGNOSIS — M25532 Pain in left wrist: Secondary | ICD-10-CM | POA: Diagnosis not present

## 2016-12-02 NOTE — Assessment & Plan Note (Signed)
Patient does have what appears to be an injury. Possible partial tear. Mild increase in Doppler flow though surrounding the area. Discussed icing. Bracing and will monitor.  RTC in 4 weeks.

## 2016-12-02 NOTE — Patient Instructions (Signed)
Good to see you  Ice is still good  Wear brace day and night fr 1 week then nightly for another 3 weeks.  Wear brace with working out.  See me again in 3 weeks.

## 2016-12-11 DIAGNOSIS — M9903 Segmental and somatic dysfunction of lumbar region: Secondary | ICD-10-CM | POA: Diagnosis not present

## 2016-12-11 DIAGNOSIS — M9902 Segmental and somatic dysfunction of thoracic region: Secondary | ICD-10-CM | POA: Diagnosis not present

## 2016-12-11 DIAGNOSIS — S138XXA Sprain of joints and ligaments of other parts of neck, initial encounter: Secondary | ICD-10-CM | POA: Diagnosis not present

## 2016-12-11 DIAGNOSIS — S39012A Strain of muscle, fascia and tendon of lower back, initial encounter: Secondary | ICD-10-CM | POA: Diagnosis not present

## 2016-12-11 DIAGNOSIS — S29012A Strain of muscle and tendon of back wall of thorax, initial encounter: Secondary | ICD-10-CM | POA: Diagnosis not present

## 2016-12-11 DIAGNOSIS — M9901 Segmental and somatic dysfunction of cervical region: Secondary | ICD-10-CM | POA: Diagnosis not present

## 2016-12-21 NOTE — Progress Notes (Signed)
[name] [address] [place], [place] [address] Phone: [phone] Subjective:    I'm seeing this patient by the request  of:  Shirline Frees, MD   CC: left wrist pain f/u   [id number]  Paul [name] is a 66 y.o. male coming in with complaint of left wrist pain. Done to potentially have a TFCC injury. Has been doing the nitroglycerin. Feeling approximately 80-90% better. No new symptoms with the wrist itself.  Patient is complaining though of bilateral elbow pain. Patient states that this is fairly severe. States that even regular daily activities such as washing his hands her holding something with his palms up causes a severe amount of pain bilaterally left greater than right. Patient denies any radiation or numbness. Denies any weakness just more pain. Even hurt when he puts light palpation such as doing a plate on his elbows.    Past Medical History:  Diagnosis Date  . Depression    RESOLVED  . Hyperlipidemia   . Spinal stenosis    WAS HAVING NECK PAIN -PT STATES NERVE ABLATION PROCEDURE 2012 AND PAIN HAS RESOLVED  . Spondylitis [facility])    Past Surgical History:  Procedure Laterality Date  . APPENDECTOMY  1962  . HERNIA REPAIR    . INSERTION OF MESH  10/06/2012   Procedure: INSERTION OF MESH;  Surgeon: Imogene Burn. Georgette Dover, MD;  Location: [facility];  Service: General;  Laterality: N/A;  . [place]   left  . TONSILLECTOMY  1968  . UMBILICAL HERNIA REPAIR  10/06/2012   Procedure: HERNIA REPAIR UMBILICAL ADULT;  Surgeon: Imogene Burn. Georgette Dover, MD;  Location: [facility];  Service: General;  Laterality: N/A;   Social History   Social History  . Marital status: Married    Spouse name: N/A  . Number of children: N/A  . Years of education: N/A   Social History Main Topics  . Smoking status: Never Smoker  . Smokeless tobacco: Never Used  . Alcohol use Yes     Comment: OCCAS ALCOHOL  . Drug use: No  . Sexual activity: Not Asked     Other Topics Concern  . None   Social History Narrative  . None   Allergies  Allergen Reactions  . Crestor [Rosuvastatin]     MUSCLE CRAMPS  . Lipitor [Atorvastatin]     Liver toxicity   Family History  Problem Relation Age of Onset  . Stroke Mother   . Heart disease Father   . Cancer Sister     bone    Past medical history, social, surgical and family history all reviewed in electronic medical record.  No pertanent information unless stated regarding to the chief complaint.   Review of Systems: No headache, visual changes, nausea, vomiting, diarrhea, constipation, dizziness, abdominal pain, skin rash, fevers, chills, night sweats, weight loss, swollen lymph nodes, body aches, joint swelling, muscle aches, chest pain, shortness of breath, mood changes.  .   Objective  Blood pressure 102/60, pulse (!) 54, height 5' 10.5" (1.791 m), weight 161 lb (73 kg).   Systems examined below as of 12/23/16 General: NAD A&O x3 mood, affect normal  HEENT: Pupils equal, extraocular movements intact no nystagmus Respiratory: not short of breath at rest or with speaking Cardiovascular: No lower extremity edema, non tender Skin: Warm dry intact with no signs of infection or rash on extremities or on axial skeleton. Abdomen: Soft nontender, no masses Neuro: Cranial nerves  intact,  neurovascularly intact in all extremities with 2+ DTRs and 2+ pulses. Lymph: No lymphadenopathy appreciated today  Gait normal with good balance and coordination.  MSK: Non tender with full range of motion and good stability and symmetric strength and tone of shoulders, wrist,  knee hips and ankles bilaterally.  Mild arthritic changes of multiple joints  Wrist: Left Inspection normal with no visible erythema or swelling. ROM smooth and normal with good flexion and extension and ulnar/radial deviation that is symmetrical with opposite wrist. Palpation is normal over metacarpals, navicular, lunate, and TFCC; tendons  without tenderness/ swelling No snuffbox tenderness. No tenderness over Canal of [place]. Strength 5/5 in all directions without pain. Negative Finkelstein, tinel's and phalens. Negative Watson's test.  Elbow: Bilateral Unremarkable to inspection. Range of motion full pronation, supination, flexion, extension. Strength is full to all of the above directions Stable to varus, valgus stress. Negative moving valgus stress test. Tender to palpation over the medial epicondylar region bilaterally. Ulnar nerve does not sublux. Negative cubital tunnel Tinel's.    MSK US performed of: Left wrist This study was ordered, performed, and interpreted by Charlann Boxer D.O.  Wrist:  Healing TFCC with increase vascularity.     IMPRESSION:  Healing TFCC     Impression and Recommendations:     This case required medical decision making of moderate complexity.      Note: This dictation was prepared with Dragon dictation along with smaller phrase technology. Any transcriptional errors that result from this process are unintentional.

## 2016-12-23 ENCOUNTER — Ambulatory Visit: Payer: Self-pay

## 2016-12-23 ENCOUNTER — Ambulatory Visit (INDEPENDENT_AMBULATORY_CARE_PROVIDER_SITE_OTHER): Payer: Medicare Other | Admitting: Family Medicine

## 2016-12-23 ENCOUNTER — Encounter: Payer: Self-pay | Admitting: Family Medicine

## 2016-12-23 VITALS — BP 102/60 | HR 54 | Ht 70.5 in | Wt 161.0 lb

## 2016-12-23 DIAGNOSIS — M7701 Medial epicondylitis, right elbow: Secondary | ICD-10-CM | POA: Diagnosis not present

## 2016-12-23 DIAGNOSIS — M25532 Pain in left wrist: Secondary | ICD-10-CM

## 2016-12-23 DIAGNOSIS — M5416 Radiculopathy, lumbar region: Secondary | ICD-10-CM | POA: Diagnosis not present

## 2016-12-23 DIAGNOSIS — S6982XD Other specified injuries of left wrist, hand and finger(s), subsequent encounter: Secondary | ICD-10-CM | POA: Diagnosis not present

## 2016-12-23 MED ORDER — GABAPENTIN 100 MG PO CAPS: 200.0000 mg | ORAL_CAPSULE | Freq: Three times a day (TID) | ORAL | 1 refills | Status: DC

## 2016-12-23 NOTE — Assessment & Plan Note (Signed)
Stable at this time 

## 2016-12-23 NOTE — Assessment & Plan Note (Signed)
Patient does have some mild medial epicondylitis of the right elbow. I believe that that is secondary to tenderness on the left elbow. Patient does compress the area that think it is more of a aggravated inflammation true injury at this time based on ultrasound tube. We discussed icing regimen. Discussed home exercises. Patient will avoid direct impact. With 3 weeks. Worsening symptoms we'll consider further evaluation by physical therapy.

## 2016-12-23 NOTE — Assessment & Plan Note (Signed)
Stable. Seems to be improving. Nontender on exam today. Full range of motion. We will continue to monitor. Follow-up 4-6 weeks

## 2016-12-25 DIAGNOSIS — S29012A Strain of muscle and tendon of back wall of thorax, initial encounter: Secondary | ICD-10-CM | POA: Diagnosis not present

## 2016-12-25 DIAGNOSIS — M9901 Segmental and somatic dysfunction of cervical region: Secondary | ICD-10-CM | POA: Diagnosis not present

## 2016-12-25 DIAGNOSIS — S138XXA Sprain of joints and ligaments of other parts of neck, initial encounter: Secondary | ICD-10-CM | POA: Diagnosis not present

## 2016-12-25 DIAGNOSIS — M9903 Segmental and somatic dysfunction of lumbar region: Secondary | ICD-10-CM | POA: Diagnosis not present

## 2016-12-25 DIAGNOSIS — M9902 Segmental and somatic dysfunction of thoracic region: Secondary | ICD-10-CM | POA: Diagnosis not present

## 2016-12-25 DIAGNOSIS — S39012A Strain of muscle, fascia and tendon of lower back, initial encounter: Secondary | ICD-10-CM | POA: Diagnosis not present

## 2017-01-04 NOTE — Progress Notes (Signed)
[name] [address] [place], [place] [address] Phone: [phone] Subjective:    I'm seeing this patient by the request  of:  Shirline Frees, MD   CC: left wrist pain f/u, bilateral elbow pain follow-up   [id number]  Paul [name] is a 66 y.o. male coming in with complaint of left wrist pain. Done to potentially have a TFCC injury. Has been doing the nitroglycerin. Discontinue the nitroglycerin disease without a percent better.  Patient is complaining though of bilateral elbow pain. Patient was seen previously and had more of a medial epicondylitis first possible bone contusion. Patient had been sitting differently in his bike. To decrease the amount of tension in the area. Feels like he is doing much better. States that he is 80-90% better. Wearing compression sleeves with padding. Happy with the results of far     Past Medical History:  Diagnosis Date  . Depression    RESOLVED  . Hyperlipidemia   . Spinal stenosis    WAS HAVING NECK PAIN -PT STATES NERVE ABLATION PROCEDURE 2012 AND PAIN HAS RESOLVED  . Spondylitis [facility])    Past Surgical History:  Procedure Laterality Date  . APPENDECTOMY  1962  . HERNIA REPAIR    . INSERTION OF MESH  10/06/2012   Procedure: INSERTION OF MESH;  Surgeon: Imogene Burn. Georgette Dover, MD;  Location: [facility];  Service: General;  Laterality: N/A;  . [place]   left  . TONSILLECTOMY  1968  . UMBILICAL HERNIA REPAIR  10/06/2012   Procedure: HERNIA REPAIR UMBILICAL ADULT;  Surgeon: Imogene Burn. Georgette Dover, MD;  Location: [facility];  Service: General;  Laterality: N/A;   Social History   Social History  . Marital status: Married    Spouse name: N/A  . Number of children: N/A  . Years of education: N/A   Social History Main Topics  . Smoking status: Never Smoker  . Smokeless tobacco: Never Used  . Alcohol use Yes     Comment: OCCAS ALCOHOL  . Drug use: No  . Sexual activity: Not Asked   Other Topics  Concern  . None   Social History Narrative  . None   Allergies  Allergen Reactions  . Crestor [Rosuvastatin]     MUSCLE CRAMPS  . Lipitor [Atorvastatin]     Liver toxicity   Family History  Problem Relation Age of Onset  . Stroke Mother   . Heart disease Father   . Cancer Sister     bone    Past medical history, social, surgical and family history all reviewed in electronic medical record.  No pertanent information unless stated regarding to the chief complaint.   Review of Systems: No headache, visual changes, nausea, vomiting, diarrhea, constipation, dizziness, abdominal pain, skin rash, fevers, chills, night sweats, weight loss, swollen lymph nodes, body aches, joint swelling, muscle aches, chest pain, shortness of breath, mood changes.   .   Objective  Blood pressure 110/62, pulse (!) 49, height 5' 10.5" (1.791 m), weight 158 lb (71.7 kg), SpO2 98 %.   Systems examined below as of 01/06/17 General: NAD A&O x3 mood, affect normal  HEENT: Pupils equal, extraocular movements intact no nystagmus Respiratory: not short of breath at rest or with speaking Cardiovascular: No lower extremity edema, non tender Skin: Warm dry intact with no signs of infection or rash on extremities or on axial skeleton. Abdomen: Soft nontender, no masses Neuro: Cranial nerves  intact, neurovascularly intact  in all extremities with 2+ DTRs and 2+ pulses. Lymph: No lymphadenopathy appreciated today  Gait normal with good balance and coordination.  MSK: Non tender with full range of motion and good stability and symmetric strength and tone of shoulders, wrist,  knee hips and ankles bilaterally.  Mild arthritic changes of multiple joints  Wrist: Inspection normal with no visible erythema or swelling. ROM smooth and normal with good flexion and extension and ulnar/radial deviation that is symmetrical with opposite wrist. Palpation is normal over metacarpals, navicular, lunate, and TFCC; tendons  without tenderness/ swelling No snuffbox tenderness. No tenderness over Canal of [place]. Strength 5/5 in all directions without pain. Negative Finkelstein, tinel's and phalens. Negative Watson's test.   Elbow: Bilateral Unremarkable to inspection. Range of motion full pronation, supination, flexion, extension. Strength is full to all of the above directions Stable to varus, valgus stress. Negative moving valgus stress test. No discrete areas of tenderness to palpation. Ulnar nerve does not sublux. Negative cubital tunnel Tinel's.      Impression and Recommendations:     This case required medical decision making of moderate complexity.      Note: This dictation was prepared with Dragon dictation along with smaller phrase technology. Any transcriptional errors that result from this process are unintentional.

## 2017-01-06 ENCOUNTER — Ambulatory Visit (INDEPENDENT_AMBULATORY_CARE_PROVIDER_SITE_OTHER): Payer: Medicare Other | Admitting: Family Medicine

## 2017-01-06 ENCOUNTER — Encounter: Payer: Self-pay | Admitting: Family Medicine

## 2017-01-06 DIAGNOSIS — S6982XD Other specified injuries of left wrist, hand and finger(s), subsequent encounter: Secondary | ICD-10-CM

## 2017-01-06 DIAGNOSIS — M7701 Medial epicondylitis, right elbow: Secondary | ICD-10-CM

## 2017-01-06 NOTE — Assessment & Plan Note (Signed)
Resolved

## 2017-01-06 NOTE — Assessment & Plan Note (Signed)
Much better at this time. We discussed icing regimen and home exercises. We discussed which activities doing which ones to avoid. Patient's will continue with conservative therapy. Follow-up as needed.

## 2017-01-08 DIAGNOSIS — M9902 Segmental and somatic dysfunction of thoracic region: Secondary | ICD-10-CM | POA: Diagnosis not present

## 2017-01-08 DIAGNOSIS — S39012A Strain of muscle, fascia and tendon of lower back, initial encounter: Secondary | ICD-10-CM | POA: Diagnosis not present

## 2017-01-08 DIAGNOSIS — M9901 Segmental and somatic dysfunction of cervical region: Secondary | ICD-10-CM | POA: Diagnosis not present

## 2017-01-08 DIAGNOSIS — M9903 Segmental and somatic dysfunction of lumbar region: Secondary | ICD-10-CM | POA: Diagnosis not present

## 2017-01-08 DIAGNOSIS — S138XXA Sprain of joints and ligaments of other parts of neck, initial encounter: Secondary | ICD-10-CM | POA: Diagnosis not present

## 2017-01-08 DIAGNOSIS — S29012A Strain of muscle and tendon of back wall of thorax, initial encounter: Secondary | ICD-10-CM | POA: Diagnosis not present

## 2017-01-22 DIAGNOSIS — M9901 Segmental and somatic dysfunction of cervical region: Secondary | ICD-10-CM | POA: Diagnosis not present

## 2017-01-22 DIAGNOSIS — S29012A Strain of muscle and tendon of back wall of thorax, initial encounter: Secondary | ICD-10-CM | POA: Diagnosis not present

## 2017-01-22 DIAGNOSIS — M9902 Segmental and somatic dysfunction of thoracic region: Secondary | ICD-10-CM | POA: Diagnosis not present

## 2017-01-22 DIAGNOSIS — S138XXA Sprain of joints and ligaments of other parts of neck, initial encounter: Secondary | ICD-10-CM | POA: Diagnosis not present

## 2017-01-22 DIAGNOSIS — M9903 Segmental and somatic dysfunction of lumbar region: Secondary | ICD-10-CM | POA: Diagnosis not present

## 2017-01-22 DIAGNOSIS — S39012A Strain of muscle, fascia and tendon of lower back, initial encounter: Secondary | ICD-10-CM | POA: Diagnosis not present

## 2017-02-04 DIAGNOSIS — E7800 Pure hypercholesterolemia, unspecified: Secondary | ICD-10-CM | POA: Diagnosis not present

## 2017-02-04 DIAGNOSIS — E559 Vitamin D deficiency, unspecified: Secondary | ICD-10-CM | POA: Diagnosis not present

## 2017-02-04 DIAGNOSIS — E7211 Homocystinuria: Secondary | ICD-10-CM | POA: Diagnosis not present

## 2017-02-04 DIAGNOSIS — E889 Metabolic disorder, unspecified: Secondary | ICD-10-CM | POA: Diagnosis not present

## 2017-02-04 DIAGNOSIS — J449 Chronic obstructive pulmonary disease, unspecified: Secondary | ICD-10-CM | POA: Diagnosis not present

## 2017-02-04 DIAGNOSIS — D518 Other vitamin B12 deficiency anemias: Secondary | ICD-10-CM | POA: Diagnosis not present

## 2017-02-04 DIAGNOSIS — E291 Testicular hypofunction: Secondary | ICD-10-CM | POA: Diagnosis not present

## 2017-02-04 DIAGNOSIS — E349 Endocrine disorder, unspecified: Secondary | ICD-10-CM | POA: Diagnosis not present

## 2017-02-04 DIAGNOSIS — E756 Lipid storage disorder, unspecified: Secondary | ICD-10-CM | POA: Diagnosis not present

## 2017-02-04 DIAGNOSIS — M791 Myalgia: Secondary | ICD-10-CM | POA: Diagnosis not present

## 2017-02-04 DIAGNOSIS — E7212 Methylenetetrahydrofolate reductase deficiency: Secondary | ICD-10-CM | POA: Diagnosis not present

## 2017-02-05 DIAGNOSIS — S39012A Strain of muscle, fascia and tendon of lower back, initial encounter: Secondary | ICD-10-CM | POA: Diagnosis not present

## 2017-02-05 DIAGNOSIS — M9903 Segmental and somatic dysfunction of lumbar region: Secondary | ICD-10-CM | POA: Diagnosis not present

## 2017-02-05 DIAGNOSIS — S29012A Strain of muscle and tendon of back wall of thorax, initial encounter: Secondary | ICD-10-CM | POA: Diagnosis not present

## 2017-02-05 DIAGNOSIS — M9902 Segmental and somatic dysfunction of thoracic region: Secondary | ICD-10-CM | POA: Diagnosis not present

## 2017-02-05 DIAGNOSIS — S138XXA Sprain of joints and ligaments of other parts of neck, initial encounter: Secondary | ICD-10-CM | POA: Diagnosis not present

## 2017-02-05 DIAGNOSIS — M9901 Segmental and somatic dysfunction of cervical region: Secondary | ICD-10-CM | POA: Diagnosis not present

## 2017-02-27 DIAGNOSIS — M9902 Segmental and somatic dysfunction of thoracic region: Secondary | ICD-10-CM | POA: Diagnosis not present

## 2017-02-27 DIAGNOSIS — S39012A Strain of muscle, fascia and tendon of lower back, initial encounter: Secondary | ICD-10-CM | POA: Diagnosis not present

## 2017-02-27 DIAGNOSIS — S29012A Strain of muscle and tendon of back wall of thorax, initial encounter: Secondary | ICD-10-CM | POA: Diagnosis not present

## 2017-02-27 DIAGNOSIS — S138XXA Sprain of joints and ligaments of other parts of neck, initial encounter: Secondary | ICD-10-CM | POA: Diagnosis not present

## 2017-02-27 DIAGNOSIS — M9903 Segmental and somatic dysfunction of lumbar region: Secondary | ICD-10-CM | POA: Diagnosis not present

## 2017-02-27 DIAGNOSIS — M9901 Segmental and somatic dysfunction of cervical region: Secondary | ICD-10-CM | POA: Diagnosis not present

## 2017-02-28 ENCOUNTER — Telehealth: Payer: Self-pay

## 2017-02-28 NOTE — Telephone Encounter (Signed)
Returning patients phone call in regards to him coughing up blood at a race this past weekend and needing an appointment with Dr. Tamala Julian. Dr. Tamala Julian recommends that he see his PCP for this condition. Awaiting call back from patient.

## 2017-03-03 DIAGNOSIS — H00013 Hordeolum externum right eye, unspecified eyelid: Secondary | ICD-10-CM | POA: Diagnosis not present

## 2017-03-03 DIAGNOSIS — R05 Cough: Secondary | ICD-10-CM | POA: Diagnosis not present

## 2017-03-03 DIAGNOSIS — M7741 Metatarsalgia, right foot: Secondary | ICD-10-CM | POA: Diagnosis not present

## 2017-03-13 DIAGNOSIS — S29012A Strain of muscle and tendon of back wall of thorax, initial encounter: Secondary | ICD-10-CM | POA: Diagnosis not present

## 2017-03-13 DIAGNOSIS — M9902 Segmental and somatic dysfunction of thoracic region: Secondary | ICD-10-CM | POA: Diagnosis not present

## 2017-03-13 DIAGNOSIS — M9901 Segmental and somatic dysfunction of cervical region: Secondary | ICD-10-CM | POA: Diagnosis not present

## 2017-03-13 DIAGNOSIS — S138XXA Sprain of joints and ligaments of other parts of neck, initial encounter: Secondary | ICD-10-CM | POA: Diagnosis not present

## 2017-03-13 DIAGNOSIS — S39012A Strain of muscle, fascia and tendon of lower back, initial encounter: Secondary | ICD-10-CM | POA: Diagnosis not present

## 2017-03-13 DIAGNOSIS — M9903 Segmental and somatic dysfunction of lumbar region: Secondary | ICD-10-CM | POA: Diagnosis not present

## 2017-03-14 ENCOUNTER — Ambulatory Visit (INDEPENDENT_AMBULATORY_CARE_PROVIDER_SITE_OTHER): Payer: Medicare Other

## 2017-03-14 ENCOUNTER — Ambulatory Visit (INDEPENDENT_AMBULATORY_CARE_PROVIDER_SITE_OTHER): Payer: Medicare Other | Admitting: Podiatry

## 2017-03-14 ENCOUNTER — Ambulatory Visit: Payer: Medicare Other

## 2017-03-14 ENCOUNTER — Encounter: Payer: Self-pay | Admitting: Podiatry

## 2017-03-14 VITALS — BP 119/63 | HR 60 | Resp 16 | Ht 70.5 in | Wt 152.0 lb

## 2017-03-14 DIAGNOSIS — M2040 Other hammer toe(s) (acquired), unspecified foot: Secondary | ICD-10-CM

## 2017-03-14 DIAGNOSIS — M79675 Pain in left toe(s): Secondary | ICD-10-CM | POA: Diagnosis not present

## 2017-03-14 DIAGNOSIS — M779 Enthesopathy, unspecified: Secondary | ICD-10-CM

## 2017-03-14 DIAGNOSIS — M79671 Pain in right foot: Secondary | ICD-10-CM

## 2017-03-14 MED ORDER — TRIAMCINOLONE ACETONIDE 10 MG/ML IJ SUSP
10.0000 mg | Freq: Once | INTRAMUSCULAR | Status: AC
  Administered 2017-03-14: 10 mg

## 2017-03-14 NOTE — Progress Notes (Signed)
   Subjective:    Patient ID: Paul [name], male    DOB: 11-Oct-1951, 66 y.o.   MRN: [id number]  HPI Chief Complaint  Patient presents with  . Foot Pain    Right foot; plantar forefoot, arch and back of heel; x6 months  . Toe Pain    Bilateral; 2nd toe; pt stated, "Toe sometimes burns when running and sometimes burns on its own"; x6 months     Review of Systems  Eyes: Positive for redness.  Musculoskeletal: Positive for arthralgias.  All other systems reviewed and are negative.      Objective:   Physical Exam        Assessment & Plan:

## 2017-03-17 NOTE — Progress Notes (Signed)
Subjective:    Patient ID: Paul [name], male   DOB: 66 y.o.   MRN: [id number]   HPI patient presents with a very active history with quite a bit of discomfort around the second MPJ right with fluid buildup of about 6 months duration and states she does not remember specific injury    Review of Systems  All other systems reviewed and are negative.       Objective:  Physical Exam  Cardiovascular: Intact distal pulses.   Musculoskeletal: Normal range of motion.  Neurological: He is alert.  Skin: Skin is warm.  Nursing note and vitals reviewed.  neurovascular status intact muscle strength adequate range of motion within normal limits with patient found to have inflammation and pain around the second MPJ right with moderate elevation of the toe moderate rigid contracture and keratotic lesion formation. Patient is mild posterior pain but it appears to be compensatory     Assessment:    Inflammatory capsulitis second MPJ right with mild Achilles tendinitis right and problems that may be related to his     Plan:    H&P condition reviewed x-rays reviewed. Today I did a proximal nerve block aspirated the joint getting out a small amount of clear fluid and injected quarter cc deck Smith some Kenalog and then applied padding to reduce pressure on the joint surface and discuss long-term orthotics. Reappoint in several weeks to reevaluate  X-ray indicates there is no indications of acute pathology or stress fracture but he does have moderate structural changes and no posterior heel spur was noted

## 2017-03-25 DIAGNOSIS — M9903 Segmental and somatic dysfunction of lumbar region: Secondary | ICD-10-CM | POA: Diagnosis not present

## 2017-03-25 DIAGNOSIS — S39012A Strain of muscle, fascia and tendon of lower back, initial encounter: Secondary | ICD-10-CM | POA: Diagnosis not present

## 2017-03-25 DIAGNOSIS — S138XXA Sprain of joints and ligaments of other parts of neck, initial encounter: Secondary | ICD-10-CM | POA: Diagnosis not present

## 2017-03-25 DIAGNOSIS — M9902 Segmental and somatic dysfunction of thoracic region: Secondary | ICD-10-CM | POA: Diagnosis not present

## 2017-03-25 DIAGNOSIS — M9901 Segmental and somatic dysfunction of cervical region: Secondary | ICD-10-CM | POA: Diagnosis not present

## 2017-03-25 DIAGNOSIS — S29012A Strain of muscle and tendon of back wall of thorax, initial encounter: Secondary | ICD-10-CM | POA: Diagnosis not present

## 2017-04-07 ENCOUNTER — Ambulatory Visit (INDEPENDENT_AMBULATORY_CARE_PROVIDER_SITE_OTHER): Payer: Medicare Other | Admitting: Podiatry

## 2017-04-07 DIAGNOSIS — M779 Enthesopathy, unspecified: Secondary | ICD-10-CM

## 2017-04-08 DIAGNOSIS — S138XXA Sprain of joints and ligaments of other parts of neck, initial encounter: Secondary | ICD-10-CM | POA: Diagnosis not present

## 2017-04-08 DIAGNOSIS — M9903 Segmental and somatic dysfunction of lumbar region: Secondary | ICD-10-CM | POA: Diagnosis not present

## 2017-04-08 DIAGNOSIS — M9901 Segmental and somatic dysfunction of cervical region: Secondary | ICD-10-CM | POA: Diagnosis not present

## 2017-04-08 DIAGNOSIS — S29012A Strain of muscle and tendon of back wall of thorax, initial encounter: Secondary | ICD-10-CM | POA: Diagnosis not present

## 2017-04-08 DIAGNOSIS — M9902 Segmental and somatic dysfunction of thoracic region: Secondary | ICD-10-CM | POA: Diagnosis not present

## 2017-04-08 DIAGNOSIS — S39012A Strain of muscle, fascia and tendon of lower back, initial encounter: Secondary | ICD-10-CM | POA: Diagnosis not present

## 2017-04-09 NOTE — Progress Notes (Signed)
Subjective:    Patient ID: Paul [name], male   DOB: 66 y.o.   MRN: [id number]   HPI patient presents stating I am doing somewhat better and the medication help but I'm still getting pain in the padding seems to make a difference    ROS      Objective:  Physical Exam Neurovascular status intact with continued inflammation around the second MPJ bilateral with fluid buildup around the joint surface    Assessment:    Inflammatory capsulitis second MPJ left over right     Plan:    H&P condition reviewed and I've recommended long-term orthotics and reviewed customized orthotic devices. Patient wants to have orthotics made and I explained them to him and he is scanned for orthotics and will continue use padding until they're returned

## 2017-04-25 DIAGNOSIS — S39012A Strain of muscle, fascia and tendon of lower back, initial encounter: Secondary | ICD-10-CM | POA: Diagnosis not present

## 2017-04-25 DIAGNOSIS — M9902 Segmental and somatic dysfunction of thoracic region: Secondary | ICD-10-CM | POA: Diagnosis not present

## 2017-04-25 DIAGNOSIS — S138XXA Sprain of joints and ligaments of other parts of neck, initial encounter: Secondary | ICD-10-CM | POA: Diagnosis not present

## 2017-04-25 DIAGNOSIS — M9901 Segmental and somatic dysfunction of cervical region: Secondary | ICD-10-CM | POA: Diagnosis not present

## 2017-04-25 DIAGNOSIS — M9903 Segmental and somatic dysfunction of lumbar region: Secondary | ICD-10-CM | POA: Diagnosis not present

## 2017-04-25 DIAGNOSIS — S29012A Strain of muscle and tendon of back wall of thorax, initial encounter: Secondary | ICD-10-CM | POA: Diagnosis not present

## 2017-05-05 ENCOUNTER — Encounter: Payer: Medicare Other | Admitting: Orthotics

## 2017-05-06 DIAGNOSIS — S39012A Strain of muscle, fascia and tendon of lower back, initial encounter: Secondary | ICD-10-CM | POA: Diagnosis not present

## 2017-05-06 DIAGNOSIS — M9901 Segmental and somatic dysfunction of cervical region: Secondary | ICD-10-CM | POA: Diagnosis not present

## 2017-05-06 DIAGNOSIS — M9903 Segmental and somatic dysfunction of lumbar region: Secondary | ICD-10-CM | POA: Diagnosis not present

## 2017-05-06 DIAGNOSIS — S29012A Strain of muscle and tendon of back wall of thorax, initial encounter: Secondary | ICD-10-CM | POA: Diagnosis not present

## 2017-05-06 DIAGNOSIS — S138XXA Sprain of joints and ligaments of other parts of neck, initial encounter: Secondary | ICD-10-CM | POA: Diagnosis not present

## 2017-05-06 DIAGNOSIS — M9902 Segmental and somatic dysfunction of thoracic region: Secondary | ICD-10-CM | POA: Diagnosis not present

## 2017-05-26 DIAGNOSIS — M9901 Segmental and somatic dysfunction of cervical region: Secondary | ICD-10-CM | POA: Diagnosis not present

## 2017-05-26 DIAGNOSIS — M9902 Segmental and somatic dysfunction of thoracic region: Secondary | ICD-10-CM | POA: Diagnosis not present

## 2017-05-26 DIAGNOSIS — S39012A Strain of muscle, fascia and tendon of lower back, initial encounter: Secondary | ICD-10-CM | POA: Diagnosis not present

## 2017-05-26 DIAGNOSIS — S138XXA Sprain of joints and ligaments of other parts of neck, initial encounter: Secondary | ICD-10-CM | POA: Diagnosis not present

## 2017-05-26 DIAGNOSIS — S29012A Strain of muscle and tendon of back wall of thorax, initial encounter: Secondary | ICD-10-CM | POA: Diagnosis not present

## 2017-05-26 DIAGNOSIS — M9903 Segmental and somatic dysfunction of lumbar region: Secondary | ICD-10-CM | POA: Diagnosis not present

## 2017-06-10 DIAGNOSIS — M9901 Segmental and somatic dysfunction of cervical region: Secondary | ICD-10-CM | POA: Diagnosis not present

## 2017-06-10 DIAGNOSIS — S39012A Strain of muscle, fascia and tendon of lower back, initial encounter: Secondary | ICD-10-CM | POA: Diagnosis not present

## 2017-06-10 DIAGNOSIS — M9902 Segmental and somatic dysfunction of thoracic region: Secondary | ICD-10-CM | POA: Diagnosis not present

## 2017-06-10 DIAGNOSIS — S138XXA Sprain of joints and ligaments of other parts of neck, initial encounter: Secondary | ICD-10-CM | POA: Diagnosis not present

## 2017-06-10 DIAGNOSIS — M9903 Segmental and somatic dysfunction of lumbar region: Secondary | ICD-10-CM | POA: Diagnosis not present

## 2017-06-10 DIAGNOSIS — S29012A Strain of muscle and tendon of back wall of thorax, initial encounter: Secondary | ICD-10-CM | POA: Diagnosis not present

## 2017-06-24 DIAGNOSIS — S138XXA Sprain of joints and ligaments of other parts of neck, initial encounter: Secondary | ICD-10-CM | POA: Diagnosis not present

## 2017-06-24 DIAGNOSIS — S29012A Strain of muscle and tendon of back wall of thorax, initial encounter: Secondary | ICD-10-CM | POA: Diagnosis not present

## 2017-06-24 DIAGNOSIS — M9901 Segmental and somatic dysfunction of cervical region: Secondary | ICD-10-CM | POA: Diagnosis not present

## 2017-06-24 DIAGNOSIS — M9903 Segmental and somatic dysfunction of lumbar region: Secondary | ICD-10-CM | POA: Diagnosis not present

## 2017-06-24 DIAGNOSIS — S39012A Strain of muscle, fascia and tendon of lower back, initial encounter: Secondary | ICD-10-CM | POA: Diagnosis not present

## 2017-06-24 DIAGNOSIS — M9902 Segmental and somatic dysfunction of thoracic region: Secondary | ICD-10-CM | POA: Diagnosis not present

## 2017-07-07 DIAGNOSIS — S29012A Strain of muscle and tendon of back wall of thorax, initial encounter: Secondary | ICD-10-CM | POA: Diagnosis not present

## 2017-07-07 DIAGNOSIS — S39012A Strain of muscle, fascia and tendon of lower back, initial encounter: Secondary | ICD-10-CM | POA: Diagnosis not present

## 2017-07-07 DIAGNOSIS — M9903 Segmental and somatic dysfunction of lumbar region: Secondary | ICD-10-CM | POA: Diagnosis not present

## 2017-07-07 DIAGNOSIS — M9901 Segmental and somatic dysfunction of cervical region: Secondary | ICD-10-CM | POA: Diagnosis not present

## 2017-07-07 DIAGNOSIS — M9902 Segmental and somatic dysfunction of thoracic region: Secondary | ICD-10-CM | POA: Diagnosis not present

## 2017-07-07 DIAGNOSIS — S138XXA Sprain of joints and ligaments of other parts of neck, initial encounter: Secondary | ICD-10-CM | POA: Diagnosis not present

## 2017-07-31 DIAGNOSIS — Z1389 Encounter for screening for other disorder: Secondary | ICD-10-CM | POA: Diagnosis not present

## 2017-07-31 DIAGNOSIS — J209 Acute bronchitis, unspecified: Secondary | ICD-10-CM | POA: Diagnosis not present

## 2017-08-04 DIAGNOSIS — M9902 Segmental and somatic dysfunction of thoracic region: Secondary | ICD-10-CM | POA: Diagnosis not present

## 2017-08-04 DIAGNOSIS — S39012A Strain of muscle, fascia and tendon of lower back, initial encounter: Secondary | ICD-10-CM | POA: Diagnosis not present

## 2017-08-04 DIAGNOSIS — S29012A Strain of muscle and tendon of back wall of thorax, initial encounter: Secondary | ICD-10-CM | POA: Diagnosis not present

## 2017-08-04 DIAGNOSIS — M9901 Segmental and somatic dysfunction of cervical region: Secondary | ICD-10-CM | POA: Diagnosis not present

## 2017-08-04 DIAGNOSIS — S138XXA Sprain of joints and ligaments of other parts of neck, initial encounter: Secondary | ICD-10-CM | POA: Diagnosis not present

## 2017-08-04 DIAGNOSIS — M9903 Segmental and somatic dysfunction of lumbar region: Secondary | ICD-10-CM | POA: Diagnosis not present

## 2017-08-05 DIAGNOSIS — J209 Acute bronchitis, unspecified: Secondary | ICD-10-CM | POA: Diagnosis not present

## 2017-08-18 DIAGNOSIS — M9903 Segmental and somatic dysfunction of lumbar region: Secondary | ICD-10-CM | POA: Diagnosis not present

## 2017-08-18 DIAGNOSIS — S138XXA Sprain of joints and ligaments of other parts of neck, initial encounter: Secondary | ICD-10-CM | POA: Diagnosis not present

## 2017-08-18 DIAGNOSIS — S29012A Strain of muscle and tendon of back wall of thorax, initial encounter: Secondary | ICD-10-CM | POA: Diagnosis not present

## 2017-08-18 DIAGNOSIS — M9902 Segmental and somatic dysfunction of thoracic region: Secondary | ICD-10-CM | POA: Diagnosis not present

## 2017-08-18 DIAGNOSIS — M9901 Segmental and somatic dysfunction of cervical region: Secondary | ICD-10-CM | POA: Diagnosis not present

## 2017-08-18 DIAGNOSIS — S39012A Strain of muscle, fascia and tendon of lower back, initial encounter: Secondary | ICD-10-CM | POA: Diagnosis not present

## 2017-09-03 DIAGNOSIS — S29012A Strain of muscle and tendon of back wall of thorax, initial encounter: Secondary | ICD-10-CM | POA: Diagnosis not present

## 2017-09-03 DIAGNOSIS — S138XXA Sprain of joints and ligaments of other parts of neck, initial encounter: Secondary | ICD-10-CM | POA: Diagnosis not present

## 2017-09-03 DIAGNOSIS — M9902 Segmental and somatic dysfunction of thoracic region: Secondary | ICD-10-CM | POA: Diagnosis not present

## 2017-09-03 DIAGNOSIS — M9901 Segmental and somatic dysfunction of cervical region: Secondary | ICD-10-CM | POA: Diagnosis not present

## 2017-09-03 DIAGNOSIS — S39012A Strain of muscle, fascia and tendon of lower back, initial encounter: Secondary | ICD-10-CM | POA: Diagnosis not present

## 2017-09-03 DIAGNOSIS — M9903 Segmental and somatic dysfunction of lumbar region: Secondary | ICD-10-CM | POA: Diagnosis not present

## 2017-09-04 DIAGNOSIS — Z23 Encounter for immunization: Secondary | ICD-10-CM | POA: Diagnosis not present

## 2017-09-17 DIAGNOSIS — S29012A Strain of muscle and tendon of back wall of thorax, initial encounter: Secondary | ICD-10-CM | POA: Diagnosis not present

## 2017-09-17 DIAGNOSIS — S138XXA Sprain of joints and ligaments of other parts of neck, initial encounter: Secondary | ICD-10-CM | POA: Diagnosis not present

## 2017-09-17 DIAGNOSIS — M9901 Segmental and somatic dysfunction of cervical region: Secondary | ICD-10-CM | POA: Diagnosis not present

## 2017-09-17 DIAGNOSIS — S39012A Strain of muscle, fascia and tendon of lower back, initial encounter: Secondary | ICD-10-CM | POA: Diagnosis not present

## 2017-09-17 DIAGNOSIS — M9902 Segmental and somatic dysfunction of thoracic region: Secondary | ICD-10-CM | POA: Diagnosis not present

## 2017-09-17 DIAGNOSIS — M9903 Segmental and somatic dysfunction of lumbar region: Secondary | ICD-10-CM | POA: Diagnosis not present

## 2017-09-25 ENCOUNTER — Ambulatory Visit: Payer: Self-pay

## 2017-09-25 ENCOUNTER — Encounter: Payer: Self-pay | Admitting: Family Medicine

## 2017-09-25 ENCOUNTER — Ambulatory Visit (INDEPENDENT_AMBULATORY_CARE_PROVIDER_SITE_OTHER): Payer: Medicare Other | Admitting: Family Medicine

## 2017-09-25 VITALS — BP 118/74 | HR 84 | Ht 71.0 in | Wt 158.0 lb

## 2017-09-25 DIAGNOSIS — G8929 Other chronic pain: Secondary | ICD-10-CM

## 2017-09-25 DIAGNOSIS — M25562 Pain in left knee: Principal | ICD-10-CM

## 2017-09-25 DIAGNOSIS — S82839A Other fracture of upper and lower end of unspecified fibula, initial encounter for closed fracture: Secondary | ICD-10-CM | POA: Insufficient documentation

## 2017-09-25 DIAGNOSIS — S82832A Other fracture of upper and lower end of left fibula, initial encounter for closed fracture: Secondary | ICD-10-CM | POA: Diagnosis not present

## 2017-09-25 NOTE — Assessment & Plan Note (Signed)
Patient has what appears to be a small stress fracture in the overuse injury.  Concerned though because of x-rays which patient declined.  We discussed icing regimen.  Continues to have difficulty on this side and possible EMG will be necessary.  We discussed which activities to avoid.  Discussed compression.  Following up again in 4 weeks.

## 2017-09-25 NOTE — Patient Instructions (Signed)
Good to see you  It seems to be a stress fracture of the fibula head Ice is your friend.  Change bike seat and move it up  K2 any dose over the counter for 4 weeks.  Compression with activity  Limit running to 1-2 times a week  See me again in 4 weeks

## 2017-09-25 NOTE — Progress Notes (Signed)
[name] [place] [place], [place] [address] Phone: [phone] Subjective:    I'm seeing this patient by the request  of:    CC: Left leg pain  [id number]  Paul [name] is a 66 y.o. male coming in for left knee pain. He has been having pain for the past 2 weeks. His pain is on the lateral superior calf behind the knee that he describes as dull pain. With riding his pain is on the anterior portion of the knee and this is sharp pain.  Denies any history of injury.  Patient different.  Patient is training but next race is not until February of next year.       Past Medical History:  Diagnosis Date  . Depression    RESOLVED  . Hyperlipidemia   . Spinal stenosis    WAS HAVING NECK PAIN -PT STATES NERVE ABLATION PROCEDURE 2012 AND PAIN HAS RESOLVED  . Spondylitis [facility])    Past Surgical History:  Procedure Laterality Date  . APPENDECTOMY  1962  . [place]   left  . TONSILLECTOMY  1968   Social History   Socioeconomic History  . Marital status: Married    Spouse name: None  . Number of children: None  . Years of education: None  . Highest education level: None  Social Needs  . Financial resource strain: None  . Food insecurity - worry: None  . Food insecurity - inability: None  . Transportation needs - medical: None  . Transportation needs - non-medical: None  Occupational History  . None  Tobacco Use  . Smoking status: Never Smoker  . Smokeless tobacco: Never Used  Substance and Sexual Activity  . Alcohol use: Yes    Comment: OCCAS ALCOHOL  . Drug use: No  . Sexual activity: None  Other Topics Concern  . None  Social History Narrative  . None   Allergies  Allergen Reactions  . Crestor [Rosuvastatin]     MUSCLE CRAMPS  . Lipitor [Atorvastatin]     Liver toxicity   Family History  Problem Relation Age of Onset  . Stroke Mother   . Heart disease Father   . Cancer Sister          bone     Past medical history, social, surgical and family history all reviewed in electronic medical record.  No pertanent information unless stated regarding to the chief complaint.   Review of Systems:Review of systems updated and as accurate as of 09/25/17  No headache, visual changes, nausea, vomiting, diarrhea, constipation, dizziness, abdominal pain, skin rash, fevers, chills, night sweats, weight loss, swollen lymph nodes, body aches, joint swelling, muscle aches, chest pain, shortness of breath, mood changes.   Objective  Blood pressure 118/74, pulse 84, height 5\' 11"  (1.803 m), weight 158 lb (71.7 kg), SpO2 98 %. Systems examined below as of 09/25/17   General: No apparent distress alert and oriented x3 mood and affect normal, dressed appropriately.  HEENT: Pupils equal, extraocular movements intact  Respiratory: Patient's speak in full sentences and does not appear short of breath  Cardiovascular: No lower extremity edema, non tender, no erythema  Skin: Warm dry intact with no signs of infection or rash on extremities or on axial skeleton.  Abdomen: Soft nontender  Neuro: Cranial nerves II through XII are intact, neurovascularly intact in all extremities with 2+ DTRs and 2+ pulses.  Lymph: No  lymphadenopathy of posterior or anterior cervical chain or axillae bilaterally.  Gait normal with good balance and coordination.  MSK:  Non tender with full range of motion and good stability and symmetric strength and tone of shoulders, elbows, wrist, hip, and ankles bilaterally.  Left knee exam shows the patient does have more tenderness over the proximal fibular head.  Patient does have some mild pain over the lateral gastroc negative Thompson test.  Neurovascularly intact distally with full strength.  Deep tendon reflexes intact.  Patient's knee seems to be stable.  Contralateral knee unremarkable  MSK US performed of: Left knee This study was ordered, performed, and interpreted  by Charlann Boxer D.O.  Knee: All structures visualized. Mild narrowing of the medial and lateral joint line Patient does have what appears to be a small callus formation over the superior lateral aspect of the fibular head.  Hypoechoic changes noted in the area as well with some increasing Doppler flow.  IMPRESSION: Potential stress reaction of the fibular head    Impression and Recommendations:     This case required medical decision making of moderate complexity.      Note: This dictation was prepared with Dragon dictation along with smaller phrase technology. Any transcriptional errors that result from this process are unintentional.

## 2017-10-06 DIAGNOSIS — S29012A Strain of muscle and tendon of back wall of thorax, initial encounter: Secondary | ICD-10-CM | POA: Diagnosis not present

## 2017-10-06 DIAGNOSIS — M9903 Segmental and somatic dysfunction of lumbar region: Secondary | ICD-10-CM | POA: Diagnosis not present

## 2017-10-06 DIAGNOSIS — S138XXA Sprain of joints and ligaments of other parts of neck, initial encounter: Secondary | ICD-10-CM | POA: Diagnosis not present

## 2017-10-06 DIAGNOSIS — S39012A Strain of muscle, fascia and tendon of lower back, initial encounter: Secondary | ICD-10-CM | POA: Diagnosis not present

## 2017-10-06 DIAGNOSIS — M9902 Segmental and somatic dysfunction of thoracic region: Secondary | ICD-10-CM | POA: Diagnosis not present

## 2017-10-06 DIAGNOSIS — M9901 Segmental and somatic dysfunction of cervical region: Secondary | ICD-10-CM | POA: Diagnosis not present

## 2017-10-15 DIAGNOSIS — M25511 Pain in right shoulder: Secondary | ICD-10-CM | POA: Diagnosis not present

## 2017-10-15 DIAGNOSIS — Z23 Encounter for immunization: Secondary | ICD-10-CM | POA: Diagnosis not present

## 2017-10-15 DIAGNOSIS — E559 Vitamin D deficiency, unspecified: Secondary | ICD-10-CM | POA: Diagnosis not present

## 2017-10-15 DIAGNOSIS — E291 Testicular hypofunction: Secondary | ICD-10-CM | POA: Diagnosis not present

## 2017-10-15 DIAGNOSIS — Z0000 Encounter for general adult medical examination without abnormal findings: Secondary | ICD-10-CM | POA: Diagnosis not present

## 2017-10-15 DIAGNOSIS — Z125 Encounter for screening for malignant neoplasm of prostate: Secondary | ICD-10-CM | POA: Diagnosis not present

## 2017-10-15 DIAGNOSIS — D518 Other vitamin B12 deficiency anemias: Secondary | ICD-10-CM | POA: Diagnosis not present

## 2017-10-15 DIAGNOSIS — E7800 Pure hypercholesterolemia, unspecified: Secondary | ICD-10-CM | POA: Diagnosis not present

## 2017-10-16 DIAGNOSIS — Z125 Encounter for screening for malignant neoplasm of prostate: Secondary | ICD-10-CM | POA: Diagnosis not present

## 2017-10-16 DIAGNOSIS — D518 Other vitamin B12 deficiency anemias: Secondary | ICD-10-CM | POA: Diagnosis not present

## 2017-10-16 DIAGNOSIS — E291 Testicular hypofunction: Secondary | ICD-10-CM | POA: Diagnosis not present

## 2017-10-16 DIAGNOSIS — E7800 Pure hypercholesterolemia, unspecified: Secondary | ICD-10-CM | POA: Diagnosis not present

## 2017-10-16 DIAGNOSIS — E559 Vitamin D deficiency, unspecified: Secondary | ICD-10-CM | POA: Diagnosis not present

## 2017-10-22 ENCOUNTER — Ambulatory Visit: Payer: Self-pay

## 2017-10-22 ENCOUNTER — Encounter: Payer: Self-pay | Admitting: Family Medicine

## 2017-10-22 ENCOUNTER — Ambulatory Visit (INDEPENDENT_AMBULATORY_CARE_PROVIDER_SITE_OTHER): Payer: Medicare Other | Admitting: Family Medicine

## 2017-10-22 VITALS — BP 104/58 | HR 69 | Ht 70.0 in | Wt 163.0 lb

## 2017-10-22 DIAGNOSIS — M9902 Segmental and somatic dysfunction of thoracic region: Secondary | ICD-10-CM | POA: Diagnosis not present

## 2017-10-22 DIAGNOSIS — M25562 Pain in left knee: Principal | ICD-10-CM

## 2017-10-22 DIAGNOSIS — G8929 Other chronic pain: Secondary | ICD-10-CM

## 2017-10-22 DIAGNOSIS — S29012A Strain of muscle and tendon of back wall of thorax, initial encounter: Secondary | ICD-10-CM | POA: Diagnosis not present

## 2017-10-22 DIAGNOSIS — S39012A Strain of muscle, fascia and tendon of lower back, initial encounter: Secondary | ICD-10-CM | POA: Diagnosis not present

## 2017-10-22 DIAGNOSIS — S138XXA Sprain of joints and ligaments of other parts of neck, initial encounter: Secondary | ICD-10-CM | POA: Diagnosis not present

## 2017-10-22 DIAGNOSIS — M9903 Segmental and somatic dysfunction of lumbar region: Secondary | ICD-10-CM | POA: Diagnosis not present

## 2017-10-22 DIAGNOSIS — S82832D Other fracture of upper and lower end of left fibula, subsequent encounter for closed fracture with routine healing: Secondary | ICD-10-CM

## 2017-10-22 DIAGNOSIS — M9901 Segmental and somatic dysfunction of cervical region: Secondary | ICD-10-CM | POA: Diagnosis not present

## 2017-10-22 NOTE — Assessment & Plan Note (Signed)
I believe the patient did have more of a proximal fibular head injury.  Seems to be healing on ultrasound with good callus formation.  Discussed with patient about compression, avoiding the orthotics with running or high impact exercises.  We discussed other over-the-counter ones that could be more beneficial.  Follow-up again in 4-6 weeks

## 2017-10-22 NOTE — Patient Instructions (Signed)
Good to see you  Ice is your friend Continue the vitmain D The custom orthotics only in walking shoes Otherwise [employer] orthotics "total support" online would be great  [email or web address] for the heel lift.  Continue to do the pool and bike a little more then running up to christmas then no restriction  Happy holidays!  See me when you need me

## 2017-10-22 NOTE — Progress Notes (Signed)
[name] [place], [place] [address] Phone: [phone] Subjective:    I'm seeing this patient by the request  of:    CC: Left knee pain follow-up  [id number]  Paul [name] is a 66 y.o. male coming in with complaint of left knee pain.  Was found to have what appeared to be a stress reaction of the fibula.  Patient did start vitamin D, patient notices when he uses the custom orthotics yet from another provider seems to be worsening.  Discontinued this and has noticed improvement.       Past Medical History:  Diagnosis Date  . Depression    RESOLVED  . Hyperlipidemia   . Spinal stenosis    WAS HAVING NECK PAIN -PT STATES NERVE ABLATION PROCEDURE 2012 AND PAIN HAS RESOLVED  . Spondylitis [facility])    Past Surgical History:  Procedure Laterality Date  . APPENDECTOMY  1962  . HERNIA REPAIR    . INSERTION OF MESH  10/06/2012   Procedure: INSERTION OF MESH;  Surgeon: Imogene Burn. Georgette Dover, MD;  Location: [facility];  Service: General;  Laterality: N/A;  . [place]   left  . TONSILLECTOMY  1968  . UMBILICAL HERNIA REPAIR  10/06/2012   Procedure: HERNIA REPAIR UMBILICAL ADULT;  Surgeon: Imogene Burn. Georgette Dover, MD;  Location: [facility];  Service: General;  Laterality: N/A;   Social History   Socioeconomic History  . Marital status: Married    Spouse name: None  . Number of children: None  . Years of education: None  . Highest education level: None  Social Needs  . Financial resource strain: None  . Food insecurity - worry: None  . Food insecurity - inability: None  . Transportation needs - medical: None  . Transportation needs - non-medical: None  Occupational History  . None  Tobacco Use  . Smoking status: Never Smoker  . Smokeless tobacco: Never Used  Substance and Sexual Activity  . Alcohol use: Yes    Comment: OCCAS ALCOHOL  . Drug use: No  . Sexual activity: None  Other Topics Concern  . None  Social History  Narrative  . None   Allergies  Allergen Reactions  . Crestor [Rosuvastatin]     MUSCLE CRAMPS  . Lipitor [Atorvastatin]     Liver toxicity   Family History  Problem Relation Age of Onset  . Stroke Mother   . Heart disease Father   . Cancer Sister        bone     Past medical history, social, surgical and family history all reviewed in electronic medical record.  No pertanent information unless stated regarding to the chief complaint.   Review of Systems:Review of systems updated and as accurate as of 10/22/17  No headache, visual changes, nausea, vomiting, diarrhea, constipation, dizziness, abdominal pain, skin rash, fevers, chills, night sweats, weight loss, swollen lymph nodes, body aches, joint swelling,  chest pain, shortness of breath, mood changes.  Positive muscle aches  Objective  Blood pressure (!) 104/58, pulse 69, height 5\' 10"  (1.778 m), weight 163 lb (73.9 kg), SpO2 96 %. Systems examined below as of 10/22/17   General: No apparent distress alert and oriented x3 mood and affect normal, dressed appropriately.  HEENT: Pupils equal, extraocular movements intact  Respiratory: Patient's speak in full sentences and does not appear short of breath  Cardiovascular: No lower extremity edema, non tender, no erythema  Skin: Warm dry  intact with no signs of infection or rash on extremities or on axial skeleton.  Abdomen: Soft nontender  Neuro: Cranial nerves II through XII are intact, neurovascularly intact in all extremities with 2+ DTRs and 2+ pulses.  Lymph: No lymphadenopathy of posterior or anterior cervical chain or axillae bilaterally.  Gait normal with good balance and coordination.  MSK:  Non tender with full range of motion and good stability and symmetric strength and tone of shoulders, elbows, wrist, hip, and ankles bilaterally.  Left knee shows patient still minimally tender over the fibular head.  Otherwise unremarkable.  Patient's knee moves with no significant  crepitus.  No effusion noted.  Good strength of the lower extremity  Limited muscular skeletal ultrasound was performed and interpreted by [name]  Limited ultrasound of patient's left knee and fibular head shows the patient does have a mild callus formation with increasing Doppler flow as well as mild hypoechoic changes over the neck of the fibula. Pression: Healing of stress reaction   Impression and Recommendations:     This case required medical decision making of moderate complexity.      Note: This dictation was prepared with Dragon dictation along with smaller phrase technology. Any transcriptional errors that result from this process are unintentional.

## 2017-11-21 DIAGNOSIS — S138XXA Sprain of joints and ligaments of other parts of neck, initial encounter: Secondary | ICD-10-CM | POA: Diagnosis not present

## 2017-11-21 DIAGNOSIS — S29012A Strain of muscle and tendon of back wall of thorax, initial encounter: Secondary | ICD-10-CM | POA: Diagnosis not present

## 2017-11-21 DIAGNOSIS — M9903 Segmental and somatic dysfunction of lumbar region: Secondary | ICD-10-CM | POA: Diagnosis not present

## 2017-11-21 DIAGNOSIS — M9902 Segmental and somatic dysfunction of thoracic region: Secondary | ICD-10-CM | POA: Diagnosis not present

## 2017-11-21 DIAGNOSIS — S39012A Strain of muscle, fascia and tendon of lower back, initial encounter: Secondary | ICD-10-CM | POA: Diagnosis not present

## 2017-11-21 DIAGNOSIS — M9901 Segmental and somatic dysfunction of cervical region: Secondary | ICD-10-CM | POA: Diagnosis not present

## 2017-12-02 ENCOUNTER — Encounter: Payer: Self-pay | Admitting: Family Medicine

## 2017-12-02 ENCOUNTER — Ambulatory Visit: Payer: Self-pay

## 2017-12-02 ENCOUNTER — Ambulatory Visit (INDEPENDENT_AMBULATORY_CARE_PROVIDER_SITE_OTHER): Payer: Medicare Other | Admitting: Family Medicine

## 2017-12-02 VITALS — BP 130/74 | HR 58 | Ht 70.0 in | Wt 165.0 lb

## 2017-12-02 DIAGNOSIS — M7062 Trochanteric bursitis, left hip: Secondary | ICD-10-CM | POA: Diagnosis not present

## 2017-12-02 DIAGNOSIS — M79605 Pain in left leg: Secondary | ICD-10-CM | POA: Diagnosis not present

## 2017-12-02 NOTE — Progress Notes (Signed)
[name] [place], [place] [address] Phone: [phone] Subjective:     CC: Left leg pain follow-up  [id number]  Paul [name] is a 67 y.o. male coming in with complaint of  Left leg pain.  Patient was found to have more of a stress fracture of the fibula.  Seems to be well healed at last follow-up 1 month ago.  Starting to increase activity again and started having worsening symptoms again.  Now seems to radiate up towards the lateral aspect of the hip.  Wakes him up at night as well.  Denies any back pain but states that sometimes he has a shooting pain going down patient is concerned because he does have a triathlon in 1 month.     Past Medical History:  Diagnosis Date  . Depression    RESOLVED  . Hyperlipidemia   . Spinal stenosis    WAS HAVING NECK PAIN -PT STATES NERVE ABLATION PROCEDURE 2012 AND PAIN HAS RESOLVED  . Spondylitis [facility])    Past Surgical History:  Procedure Laterality Date  . APPENDECTOMY  1962  . HERNIA REPAIR    . INSERTION OF MESH  10/06/2012   Procedure: INSERTION OF MESH;  Surgeon: Imogene Burn. Georgette Dover, MD;  Location: [facility];  Service: General;  Laterality: N/A;  . [place]   left  . TONSILLECTOMY  1968  . UMBILICAL HERNIA REPAIR  10/06/2012   Procedure: HERNIA REPAIR UMBILICAL ADULT;  Surgeon: Imogene Burn. Georgette Dover, MD;  Location: [facility];  Service: General;  Laterality: N/A;   Social History   Socioeconomic History  . Marital status: Married    Spouse name: None  . Number of children: None  . Years of education: None  . Highest education level: None  Social Needs  . Financial resource strain: None  . Food insecurity - worry: None  . Food insecurity - inability: None  . Transportation needs - medical: None  . Transportation needs - non-medical: None  Occupational History  . None  Tobacco Use  . Smoking status: Never Smoker  . Smokeless tobacco: Never Used  Substance and Sexual  Activity  . Alcohol use: Yes    Comment: OCCAS ALCOHOL  . Drug use: No  . Sexual activity: None  Other Topics Concern  . None  Social History Narrative  . None   Allergies  Allergen Reactions  . Crestor [Rosuvastatin]     MUSCLE CRAMPS  . Lipitor [Atorvastatin]     Liver toxicity   Family History  Problem Relation Age of Onset  . Stroke Mother   . Heart disease Father   . Cancer Sister        bone     Past medical history, social, surgical and family history all reviewed in electronic medical record.  No pertanent information unless stated regarding to the chief complaint.   Review of Systems:Review of systems updated and as accurate as of 12/02/17  No headache, visual changes, nausea, vomiting, diarrhea, constipation, dizziness, abdominal pain, skin rash, fevers, chills, night sweats, weight loss, swollen lymph nodes, body aches, joint swelling, muscle aches, chest pain, shortness of breath, mood changes.   Objective  Blood pressure 130/74, pulse (!) 58, height 5\' 10"  (1.778 m), weight 165 lb (74.8 kg), SpO2 96 %. Systems examined below as of 12/02/17   General: No apparent distress alert and oriented x3 mood and affect normal, dressed appropriately.  HEENT: Pupils equal, extraocular movements  intact  Respiratory: Patient's speak in full sentences and does not appear short of breath  Cardiovascular: No lower extremity edema, non tender, no erythema  Skin: Warm dry intact with no signs of infection or rash on extremities or on axial skeleton.  Abdomen: Soft nontender  Neuro: Cranial nerves II through XII are intact, neurovascularly intact in all extremities with 2+ DTRs and 2+ pulses.  Lymph: No lymphadenopathy of posterior or anterior cervical chain or axillae bilaterally.  Gait normal with good balance and coordination.  MSK:  Non tender with full range of motion and good stability and symmetric strength and tone of shoulders, elbows, wrist, knee and ankles bilaterally.    Left knee exam shows no significant abnormality.  Full range of motion and full strength.  Left hip does show severe tenderness to palpation over the lateral greater trochanteric area.  Positive Faber test.  Negative straight leg test.  No tenderness on the back.  Full range of motion of the hip noted   Procedure: Real-time Ultrasound Guided Injection of left  greater trochanteric bursitis secondary to patient's body habitus Device: GE Logiq Q7  Ultrasound guided injection is preferred based studies that show increased duration, increased effect, greater accuracy, decreased procedural pain, increased response rate, and decreased cost with ultrasound guided versus blind injection.  Verbal informed consent obtained.  Time-out conducted.  Noted no overlying erythema, induration, or other signs of local infection.  Skin prepped in a sterile fashion.  Local anesthesia: Topical Ethyl chloride.  With sterile technique and under real time ultrasound guidance:  Greater trochanteric area was visualized and patient's bursa was noted. A 22-gauge 3 inch needle was inserted and 4 cc of 0.5% Marcaine and 1 cc of Kenalog 40 mg/dL was injected. Pictures taken Completed without difficulty  Pain immediately resolved suggesting accurate placement of the medication.  Advised to call if fevers/chills, erythema, induration, drainage, or persistent bleeding.  Images permanently stored and available for review in the ultrasound unit.  Impression: Technically successful ultrasound guided injection.   Impression and Recommendations:     This case required medical decision making of moderate complexity.      Note: This dictation was prepared with Dragon dictation along with smaller phrase technology. Any transcriptional errors that result from this process are unintentional.

## 2017-12-02 NOTE — Assessment & Plan Note (Signed)
Patient given an injection and tolerated the procedure well.  Near complete resolution of pain on the lateral aspect of the hip.  We discussed with patient about strengthening hip abductors and given home exercises.  We discussed which activities to do which wants to avoid.  Patient is to increase activity slowly over the course the next several days.  Patient will regain training in 10 days.  Follow-up with me again in 2-3 weeks.

## 2017-12-02 NOTE — Patient Instructions (Signed)
Good to see you  Ice 20 minutes 2 times daily. Usually after activity and before bed. Gabapentin 200mg  at night  Hip abductor exercises Ok to train but be mindful  See me again in 3 weeks

## 2017-12-11 DIAGNOSIS — M9901 Segmental and somatic dysfunction of cervical region: Secondary | ICD-10-CM | POA: Diagnosis not present

## 2017-12-11 DIAGNOSIS — S138XXA Sprain of joints and ligaments of other parts of neck, initial encounter: Secondary | ICD-10-CM | POA: Diagnosis not present

## 2017-12-11 DIAGNOSIS — S29012A Strain of muscle and tendon of back wall of thorax, initial encounter: Secondary | ICD-10-CM | POA: Diagnosis not present

## 2017-12-11 DIAGNOSIS — S39012A Strain of muscle, fascia and tendon of lower back, initial encounter: Secondary | ICD-10-CM | POA: Diagnosis not present

## 2017-12-11 DIAGNOSIS — M9903 Segmental and somatic dysfunction of lumbar region: Secondary | ICD-10-CM | POA: Diagnosis not present

## 2017-12-11 DIAGNOSIS — M9902 Segmental and somatic dysfunction of thoracic region: Secondary | ICD-10-CM | POA: Diagnosis not present

## 2017-12-22 ENCOUNTER — Encounter: Payer: Self-pay | Admitting: Family Medicine

## 2017-12-22 ENCOUNTER — Ambulatory Visit (INDEPENDENT_AMBULATORY_CARE_PROVIDER_SITE_OTHER): Payer: Medicare Other | Admitting: Family Medicine

## 2017-12-22 DIAGNOSIS — S82832D Other fracture of upper and lower end of left fibula, subsequent encounter for closed fracture with routine healing: Secondary | ICD-10-CM | POA: Diagnosis not present

## 2017-12-22 DIAGNOSIS — M5416 Radiculopathy, lumbar region: Secondary | ICD-10-CM | POA: Diagnosis not present

## 2017-12-22 DIAGNOSIS — M7062 Trochanteric bursitis, left hip: Secondary | ICD-10-CM

## 2017-12-22 NOTE — Progress Notes (Signed)
[name] [place], [place] [address] Phone: [phone] Subjective:    I'm seeing this patient by the request  of:    CC: Left leg pain  [id number]  Paul [name] is a 67 y.o. male coming in for follow up for hip pain. The injection alleviated his pain.  Patient states that no pain at all on the lateral aspect anymore.  He also states that his knee is doing pretty good. It locks up occasionally. Most of his pain continues to be with physical activity and is around the fibular head.  Patient states that it is more of a popping sensation with some minimal pain but nothing severe.      Past Medical History:  Diagnosis Date  . Depression    RESOLVED  . Hyperlipidemia   . Spinal stenosis    WAS HAVING NECK PAIN -PT STATES NERVE ABLATION PROCEDURE 2012 AND PAIN HAS RESOLVED  . Spondylitis [facility])    Past Surgical History:  Procedure Laterality Date  . APPENDECTOMY  1962  . HERNIA REPAIR    . INSERTION OF MESH  10/06/2012   Procedure: INSERTION OF MESH;  Surgeon: Imogene Burn. Georgette Dover, MD;  Location: [facility];  Service: General;  Laterality: N/A;  . [place]   left  . TONSILLECTOMY  1968  . UMBILICAL HERNIA REPAIR  10/06/2012   Procedure: HERNIA REPAIR UMBILICAL ADULT;  Surgeon: Imogene Burn. Georgette Dover, MD;  Location: [facility];  Service: General;  Laterality: N/A;   Social History   Socioeconomic History  . Marital status: Married    Spouse name: Not on file  . Number of children: Not on file  . Years of education: Not on file  . Highest education level: Not on file  Social Needs  . Financial resource strain: Not on file  . Food insecurity - worry: Not on file  . Food insecurity - inability: Not on file  . Transportation needs - medical: Not on file  . Transportation needs - non-medical: Not on file  Occupational History  . Not on file  Tobacco Use  . Smoking status: Never Smoker  . Smokeless tobacco: Never Used    Substance and Sexual Activity  . Alcohol use: Yes    Comment: OCCAS ALCOHOL  . Drug use: No  . Sexual activity: Not on file  Other Topics Concern  . Not on file  Social History Narrative  . Not on file   Allergies  Allergen Reactions  . Crestor [Rosuvastatin]     MUSCLE CRAMPS  . Lipitor [Atorvastatin]     Liver toxicity   Family History  Problem Relation Age of Onset  . Stroke Mother   . Heart disease Father   . Cancer Sister        bone     Past medical history, social, surgical and family history all reviewed in electronic medical record.  No pertanent information unless stated regarding to the chief complaint.   Review of Systems:Review of systems updated and as accurate as of 12/22/17  No headache, visual changes, nausea, vomiting, diarrhea, constipation, dizziness, abdominal pain, skin rash, fevers, chills, night sweats, weight loss, swollen lymph nodes, body aches, joint swelling, muscle aches, chest pain, shortness of breath, mood changes.   Objective  There were no vitals taken for this visit. Systems examined below as of 12/22/17   General: No apparent distress alert and oriented x3 mood and affect normal, dressed appropriately.  HEENT: Pupils equal, extraocular movements intact  Respiratory: Patient's speak in full sentences and does not appear short of breath  Cardiovascular: No lower extremity edema, non tender, no erythema  Skin: Warm dry intact with no signs of infection or rash on extremities or on axial skeleton.  Abdomen: Soft nontender  Neuro: Cranial nerves II through XII are intact, neurovascularly intact in all extremities with 2+ DTRs and 2+ pulses.  Lymph: No lymphadenopathy of posterior or anterior cervical chain or axillae bilaterally.  Gait normal with good balance and coordination.  MSK:  Non tender with full range of motion and good stability and symmetric strength and tone of shoulders, elbows, wrist,, knee and ankles bilaterally.  Left  knee has full range of motion.  Mild discomfort still over the lateral aspect of the hamstring at its insertion.  Fibular head is no longer tender.  May be small cyst formation noted. Left hip shows patient has full range of motion.  Some mild positive pain over the lateral aspect of the hip but significantly decreased from previous exam.  Full strength.  Neurovascular intact distally.    Impression and Recommendations:     This case required medical decision making of moderate complexity.      Note: This dictation was prepared with Dragon dictation along with smaller phrase technology. Any transcriptional errors that result from this process are unintentional.

## 2017-12-22 NOTE — Assessment & Plan Note (Signed)
Patient may have a sequelae of a potential posttraumatic cyst formation.  If continuing we will consider ultrasound and possible injection.

## 2017-12-22 NOTE — Assessment & Plan Note (Signed)
Differential of the left leg pain could be secondary to more of the lumbar radiculopathy.  Patient is taking gabapentin more regularly.  Feels like it has been helpful.  Discussed icing regimen.  Patient wants to hold on any other advanced imaging at this time.

## 2017-12-22 NOTE — Assessment & Plan Note (Signed)
Resolved after injection.  Continue with conservative therapy

## 2017-12-24 ENCOUNTER — Ambulatory Visit (INDEPENDENT_AMBULATORY_CARE_PROVIDER_SITE_OTHER): Payer: Medicare Other | Admitting: Family Medicine

## 2017-12-24 ENCOUNTER — Encounter: Payer: Self-pay | Admitting: Family Medicine

## 2017-12-24 ENCOUNTER — Ambulatory Visit: Payer: Self-pay

## 2017-12-24 VITALS — BP 122/78 | Ht 70.5 in | Wt 161.0 lb

## 2017-12-24 DIAGNOSIS — M25562 Pain in left knee: Secondary | ICD-10-CM

## 2017-12-24 DIAGNOSIS — S83282A Other tear of lateral meniscus, current injury, left knee, initial encounter: Secondary | ICD-10-CM

## 2017-12-24 DIAGNOSIS — S83289A Other tear of lateral meniscus, current injury, unspecified knee, initial encounter: Secondary | ICD-10-CM | POA: Insufficient documentation

## 2017-12-24 NOTE — Progress Notes (Signed)
[name] [address] [place], [place] [address] Phone: [phone] Subjective:     CC: Left knee pain follow-up  HMC:[id number]  Paul [name] is a 67 y.o. male coming in with complaint of has had difficulty with the left knee.  Was found to have a fibular head stress fracture not completely healed.  Then found a cyst on the lateral aspect of the knee that seem to be more of a peri-meniscal cyst.  Patient states was seemed to improve but then was running on uneven surfaces and felt a sharp pain on the lateral aspect of the knee.    Past Medical History:  Diagnosis Date  . Depression    RESOLVED  . Hyperlipidemia   . Spinal stenosis    WAS HAVING NECK PAIN -PT STATES NERVE ABLATION PROCEDURE 2012 AND PAIN HAS RESOLVED  . Spondylitis [facility])    Past Surgical History:  Procedure Laterality Date  . APPENDECTOMY  1962  . HERNIA REPAIR    . INSERTION OF MESH  10/06/2012   Procedure: INSERTION OF MESH;  Surgeon: Imogene Burn. Georgette Dover, MD;  Location: [facility];  Service: General;  Laterality: N/A;  . [place]   left  . TONSILLECTOMY  1968  . UMBILICAL HERNIA REPAIR  10/06/2012   Procedure: HERNIA REPAIR UMBILICAL ADULT;  Surgeon: Imogene Burn. Georgette Dover, MD;  Location: [facility];  Service: General;  Laterality: N/A;   Social History   Socioeconomic History  . Marital status: Married    Spouse name: None  . Number of children: None  . Years of education: None  . Highest education level: None  Social Needs  . Financial resource strain: None  . Food insecurity - worry: None  . Food insecurity - inability: None  . Transportation needs - medical: None  . Transportation needs - non-medical: None  Occupational History  . None  Tobacco Use  . Smoking status: Never Smoker  . Smokeless tobacco: Never Used  Substance and Sexual Activity  . Alcohol use: Yes    Comment: OCCAS ALCOHOL  . Drug use: No  . Sexual activity: None  Other Topics Concern    . None  Social History Narrative  . None   Allergies  Allergen Reactions  . Crestor [Rosuvastatin]     MUSCLE CRAMPS  . Lipitor [Atorvastatin]     Liver toxicity   Family History  Problem Relation Age of Onset  . Stroke Mother   . Heart disease Father   . Cancer Sister        bone     Past medical history, social, surgical and family history all reviewed in electronic medical record.  No pertanent information unless stated regarding to the chief complaint.   Review of Systems:Review of systems updated and as accurate as of 12/24/17  No headache, visual changes, nausea, vomiting, diarrhea, constipation, dizziness, abdominal pain, skin rash, fevers, chills, night sweats, weight loss, swollen lymph nodes, body aches, joint swelling,  chest pain, shortness of breath, mood changes.  Positive muscle aches  Objective  Blood pressure 122/78, height 5' 10.5" (1.791 m), weight 161 lb (73 kg). Systems examined below as of 12/24/17   General: No apparent distress alert and oriented x3 mood and affect normal, dressed appropriately.  HEENT: Pupils equal, extraocular movements intact  Respiratory: Patient's speak in full sentences and does not appear short of breath  Cardiovascular: No lower extremity edema, non tender, no erythema  Skin: Warm  dry intact with no signs of infection or rash on extremities or on axial skeleton.  Abdomen: Soft nontender  Neuro: Cranial nerves II through XII are intact, neurovascularly intact in all extremities with 2+ DTRs and 2+ pulses.  Lymph: No lymphadenopathy of posterior or anterior cervical chain or axillae bilaterally.  Gait mild antalgic MSK:  Non tender with full range of motion and good stability and symmetric strength and tone of shoulders, elbows, wrist, hip, and ankles bilaterally.   Knee: Left Normal to inspection with no erythema or effusion or obvious bony abnormalities. Palpation normal with no warmth, joint line tenderness, patellar  tenderness, or condyle tenderness. ROM full in flexion and extension and lower leg rotation. Ligaments with solid consistent endpoints including ACL, PCL, LCL, MCL. Positive Mcmurray's, Apley's, and Thessalonian tests. painful patellar compression. Patellar glide with mild to moderate crepitus. Patellar and quadriceps tendons unremarkable. Hamstring and quadriceps strength is normal.  MSK US performed of: Left knee This study was ordered, performed, and interpreted by Charlann Boxer D.O.  Knee: All structures visualized. Lateral meniscus does show an acute on chronic tear.  Hypoechoic changes noted in the area.  Mild increase in Doppler flow Patellar Tendon unremarkable on long and transverse views without effusion. No abnormality of prepatellar bursa. LCL and MCL unremarkable on long and transverse views. No abnormality of origin of medial or lateral head of the gastrocnemius.  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      Note: This dictation was prepared with Dragon dictation along with smaller phrase technology. Any transcriptional errors that result from this process are unintentional.

## 2017-12-24 NOTE — Assessment & Plan Note (Signed)
Lateral meniscal tear noted.  Discussed icing regimen and home exercises.  Discussed which activities of doing which wants to avoid.  Patient was to increase activity slowly over the course the next several days.  Patient may hold out of his brace in the near future.  We discussed the possibility of injections versus possible Visco supplementation or PRP.  Patient will continue with conservative therapy at this time follow-up in 4 weeks

## 2017-12-24 NOTE — Patient Instructions (Addendum)
Good to see yo u Sorry for the bad news A large lateral meniscus tear noted.  We will get you set up for PRP Then it will be slow getting you back  See you soon

## 2017-12-27 NOTE — Progress Notes (Signed)
[name] [place] [place], [place] [address] Phone: [phone] Subjective:    I'm seeing this patient by the request  of:    CC: Left knee pain  [id number]  Paul [name] is a 66 y.o. male coming in with complaint of left knee pain.  Found to have an acute on chronic meniscal tear on the lateral aspect.  Failed most conservative therapy over the course of time and will try PRP injection.  Patient states still having the pain at this time.     Past Medical History:  Diagnosis Date  . Depression    RESOLVED  . Hyperlipidemia   . Spinal stenosis    WAS HAVING NECK PAIN -PT STATES NERVE ABLATION PROCEDURE 2012 AND PAIN HAS RESOLVED  . Spondylitis [name])    Past Surgical History:  Procedure Laterality Date  . APPENDECTOMY  1962  . HERNIA REPAIR    . INSERTION OF MESH  10/06/2012   Procedure: INSERTION OF MESH;  Surgeon: Imogene Burn. Georgette Dover, MD;  [facility];  Service: General;  Laterality: N/A;  . [place]   left  . TONSILLECTOMY  1968  . UMBILICAL HERNIA REPAIR  10/06/2012   Procedure: HERNIA REPAIR UMBILICAL ADULT;  Surgeon: Imogene Burn. Georgette Dover, MD;  [facility]: [facility];  Service: General;  Laterality: N/A;   Social History   Socioeconomic History  . Marital status: Married    Spouse name: Not on file  . Number of children: Not on file  . Years of education: Not on file  . Highest education level: Not on file  Social Needs  . Financial resource strain: Not on file  . Food insecurity - worry: Not on file  . Food insecurity - inability: Not on file  . Transportation needs - medical: Not on file  . Transportation needs - non-medical: Not on file  Occupational History  . Not on file  Tobacco Use  . Smoking status: Never Smoker  . Smokeless tobacco: Never Used  Substance and Sexual Activity  . Alcohol use: Yes    Comment: OCCAS ALCOHOL  . Drug use: No  . Sexual activity: Not on file  Other Topics Concern  .  Not on file  Social History Narrative  . Not on file   Allergies  Allergen Reactions  . Crestor [Rosuvastatin]     MUSCLE CRAMPS  . Lipitor [Atorvastatin]     Liver toxicity   Family History  Problem Relation Age of Onset  . Stroke Mother   . Heart disease Father   . Cancer Sister        bone     Past medical history, social, surgical and family history all reviewed in electronic medical record.  No pertanent information unless stated regarding to the chief complaint.   Review of Systems:Review of systems updated and as accurate as of 12/29/17  No headache, visual changes, nausea, vomiting, diarrhea, constipation, dizziness, abdominal pain, skin rash, fevers, chills, night sweats, weight loss, swollen lymph nodes, body aches, joint swelling, muscle aches, chest pain, shortness of breath, mood changes.   Objective  Height 5' 10.5" (1.791 m), weight 161 lb (73 kg). Systems examined below as of 12/29/17   General: No apparent distress alert and oriented x3 mood and affect normal, dressed appropriately.  HEENT: Pupils equal, extraocular movements intact  Respiratory: Patient's speak in full sentences and does not appear short of breath  Cardiovascular: No lower extremity edema, non  tender, no erythema  Skin: Warm dry intact with no signs of infection or rash on extremities or on axial skeleton.  Abdomen: Soft nontender  Neuro: Cranial nerves II through XII are intact, neurovascularly intact in all extremities with 2+ DTRs and 2+ pulses.  Lymph: No lymphadenopathy of posterior or anterior cervical chain or axillae bilaterally.  Gait normal with good balance and coordination.  MSK:  Non tender with full range of motion and good stability and symmetric strength and tone of shoulders, elbows, wrist, hip, and ankles bilaterally.  Left knee exam shows the patient does have a positive McMurray's and pain on the lateral knee.  Procedure: Real-time Ultrasound Guided Injection of left  lateral meniscal tear Device: GE Logiq Q7 Ultrasound guided injection is preferred based studies that show increased duration, increased effect, greater accuracy, decreased procedural pain, increased response rate, and decreased cost with ultrasound guided versus blind injection.  Verbal informed consent obtained.  Time-out conducted.  Noted no overlying erythema, induration, or other signs of local infection.  Skin prepped in a sterile fashion.  Local anesthesia: Topical Ethyl chloride.  With sterile technique and under real time ultrasound guidance: With a 21-gauge 2 inch needle patient was injected with 1 cc of 0.5% Marcaine right in the area where the peripheral meniscal tear noted.  Then had injection of 5 cc of pre-centrifuge PRP. Completed without difficulty  Pain immediately resolved suggesting accurate placement of the medication.  Advised to call if fevers/chills, erythema, induration, drainage, or persistent bleeding.  Images permanently stored and available for review in the ultrasound unit.  Impression: Technically successful ultrasound guided injection.    Impression and Recommendations:     This case required medical decision making of moderate complexity.      Note: This dictation was prepared with Dragon dictation along with smaller phrase technology. Any transcriptional errors that result from this process are unintentional.

## 2017-12-29 ENCOUNTER — Ambulatory Visit: Payer: Self-pay

## 2017-12-29 ENCOUNTER — Ambulatory Visit (INDEPENDENT_AMBULATORY_CARE_PROVIDER_SITE_OTHER): Payer: Medicare Other | Admitting: Family Medicine

## 2017-12-29 ENCOUNTER — Other Ambulatory Visit: Payer: Medicare Other

## 2017-12-29 VITALS — Ht 70.5 in | Wt 161.0 lb

## 2017-12-29 DIAGNOSIS — G8929 Other chronic pain: Secondary | ICD-10-CM

## 2017-12-29 DIAGNOSIS — S83282A Other tear of lateral meniscus, current injury, left knee, initial encounter: Secondary | ICD-10-CM

## 2017-12-29 DIAGNOSIS — M25562 Pain in left knee: Secondary | ICD-10-CM

## 2017-12-29 NOTE — Patient Instructions (Signed)
The hardest part of this will be you having to be patient.  Continue the vitamins Next 72 hours if you can avoid ibuprofen and ice that would be good.  Follow the return to sport progression  In week 2 light swimming Week 3 biking Week 4 running See me again in 3 weeks

## 2017-12-29 NOTE — Assessment & Plan Note (Signed)
PRP injection given today.  Slow progression to increase activity.  Discussed avoiding icing regimen at this time.  Avoid certain activities.  Follow-up with me again in 4-6 weeks.

## 2018-01-05 DIAGNOSIS — M9901 Segmental and somatic dysfunction of cervical region: Secondary | ICD-10-CM | POA: Diagnosis not present

## 2018-01-05 DIAGNOSIS — S138XXA Sprain of joints and ligaments of other parts of neck, initial encounter: Secondary | ICD-10-CM | POA: Diagnosis not present

## 2018-01-05 DIAGNOSIS — M9902 Segmental and somatic dysfunction of thoracic region: Secondary | ICD-10-CM | POA: Diagnosis not present

## 2018-01-05 DIAGNOSIS — M9903 Segmental and somatic dysfunction of lumbar region: Secondary | ICD-10-CM | POA: Diagnosis not present

## 2018-01-05 DIAGNOSIS — S39012A Strain of muscle, fascia and tendon of lower back, initial encounter: Secondary | ICD-10-CM | POA: Diagnosis not present

## 2018-01-05 DIAGNOSIS — S29012A Strain of muscle and tendon of back wall of thorax, initial encounter: Secondary | ICD-10-CM | POA: Diagnosis not present

## 2018-01-18 NOTE — Progress Notes (Signed)
[name] [name], [name] [address] Phone: [phone] Subjective:     CC: Knee injury follow-up  VOZ:[id number]  Paul [name] is a 67 y.o. male coming in with complaint of found to have a acute on chronic tear of the lateral meniscus.  Patient given a PRP injection.  Patient was to do home exercise, icing regimen, topical anti-inflammatories.  Patient states that he is still feeling some pain where there is no pattern. He has been doing physical activity at around 50% max.      Past Medical History:  Diagnosis Date  . Depression    RESOLVED  . Hyperlipidemia   . Spinal stenosis    WAS HAVING NECK PAIN -PT STATES NERVE ABLATION PROCEDURE 2012 AND PAIN HAS RESOLVED  . Spondylitis [facility])    Past Surgical History:  Procedure Laterality Date  . APPENDECTOMY  1962  . HERNIA REPAIR    . INSERTION OF MESH  10/06/2012   Procedure: INSERTION OF MESH;  Surgeon: Imogene Burn. Georgette Dover, MD;  [facility];  Service: General;  Laterality: N/A;  . [place]   left  . TONSILLECTOMY  1968  . UMBILICAL HERNIA REPAIR  10/06/2012   Procedure: HERNIA REPAIR UMBILICAL ADULT;  Surgeon: Imogene Burn. Georgette Dover, MD;  Location: [facility];  Service: General;  Laterality: N/A;   Social History   Socioeconomic History  . Marital status: Married    Spouse name: Not on file  . Number of children: Not on file  . Years of education: Not on file  . Highest education level: Not on file  Social Needs  . Financial resource strain: Not on file  . Food insecurity - worry: Not on file  . Food insecurity - inability: Not on file  . Transportation needs - medical: Not on file  . Transportation needs - non-medical: Not on file  Occupational History  . Not on file  Tobacco Use  . Smoking status: Never Smoker  . Smokeless tobacco: Never Used  Substance and Sexual Activity  . Alcohol use: Yes    Comment: OCCAS ALCOHOL  . Drug use: No  . Sexual  activity: Not on file  Other Topics Concern  . Not on file  Social History Narrative  . Not on file   Allergies  Allergen Reactions  . Crestor [Rosuvastatin]     MUSCLE CRAMPS  . Lipitor [Atorvastatin]     Liver toxicity   Family History  Problem Relation Age of Onset  . Stroke Mother   . Heart disease Father   . Cancer Sister        bone     Past medical history, social, surgical and family history all reviewed in electronic medical record.  No pertanent information unless stated regarding to the chief complaint.   Review of Systems:Review of systems updated and as accurate as of 01/19/18  No headache, visual changes, nausea, vomiting, diarrhea, constipation, dizziness, abdominal pain, skin rash, fevers, chills, night sweats, weight loss, swollen lymph nodes, body aches, joint swelling, muscle aches, chest pain, shortness of breath, mood changes.   Objective  Blood pressure 110/72, pulse 69, height 5' 10.5" (1.791 m), weight 159 lb (72.1 kg), SpO2 97 %. Systems examined below as of 01/19/18   General: No apparent distress alert and oriented x3 mood and affect normal, dressed appropriately.  HEENT: Pupils equal, extraocular movements intact  Respiratory: Patient's speak in full sentences and does not appear  short of breath  Cardiovascular: No lower extremity edema, non tender, no erythema  Skin: Warm dry intact with no signs of infection or rash on extremities or on axial skeleton.  Abdomen: Soft nontender  Neuro: Cranial nerves II through XII are intact, neurovascularly intact in all extremities with 2+ DTRs and 2+ pulses.  Lymph: No lymphadenopathy of posterior or anterior cervical chain or axillae bilaterally.  Gait normal with good balance and coordination.  MSK:  Non tender with full range of motion and good stability and symmetric strength and tone of shoulders, elbows, wrist, hip, and ankles bilaterally.  Knee: Left Normal to inspection with no erythema or effusion or  obvious bony abnormalities. Mild tenderness over the lateral joint line ROM full in flexion and extension and lower leg rotation. Ligaments with solid consistent endpoints including ACL, PCL, LCL, MCL. Negative Mcmurray's, Apley's, and Thessalonian tests. Mild painful patellar compression. Patellar glide with mild crepitus. Patellar and quadriceps tendons unremarkable. Hamstring and quadriceps strength is normal.  Contralateral knee unremarkable    Impression and Recommendations:     This case required medical decision making of moderate complexity.      Note: This dictation was prepared with Dragon dictation along with smaller phrase technology. Any transcriptional errors that result from this process are unintentional.

## 2018-01-19 ENCOUNTER — Ambulatory Visit (INDEPENDENT_AMBULATORY_CARE_PROVIDER_SITE_OTHER): Payer: Medicare Other | Admitting: Family Medicine

## 2018-01-19 ENCOUNTER — Ambulatory Visit: Payer: Medicare Other | Admitting: Family Medicine

## 2018-01-19 ENCOUNTER — Encounter: Payer: Self-pay | Admitting: Family Medicine

## 2018-01-19 DIAGNOSIS — M9901 Segmental and somatic dysfunction of cervical region: Secondary | ICD-10-CM | POA: Diagnosis not present

## 2018-01-19 DIAGNOSIS — S39012A Strain of muscle, fascia and tendon of lower back, initial encounter: Secondary | ICD-10-CM | POA: Diagnosis not present

## 2018-01-19 DIAGNOSIS — S83282A Other tear of lateral meniscus, current injury, left knee, initial encounter: Secondary | ICD-10-CM | POA: Diagnosis not present

## 2018-01-19 DIAGNOSIS — S29012A Strain of muscle and tendon of back wall of thorax, initial encounter: Secondary | ICD-10-CM | POA: Diagnosis not present

## 2018-01-19 DIAGNOSIS — M9903 Segmental and somatic dysfunction of lumbar region: Secondary | ICD-10-CM | POA: Diagnosis not present

## 2018-01-19 DIAGNOSIS — M9902 Segmental and somatic dysfunction of thoracic region: Secondary | ICD-10-CM | POA: Diagnosis not present

## 2018-01-19 DIAGNOSIS — S138XXA Sprain of joints and ligaments of other parts of neck, initial encounter: Secondary | ICD-10-CM | POA: Diagnosis not present

## 2018-01-19 NOTE — Patient Instructions (Signed)
Good to see you  Ice is your friend after activity  Compression a little more regularly.  OK to increase up to 10% a week.  See me again in 3-4 weeks

## 2018-01-19 NOTE — Assessment & Plan Note (Signed)
Patient has had difficulty with this previously.  Patient has also had a fracture of the fibular head, left lumbar radiculopathy, tightness of the hamstring in this area as well as this acute on chronic meniscal tear.  Patient is doing relatively well with conservative therapy.  Patient has increasing activity but doing very slow and methodical.  Hopefully that this will continues to do well.  Patient will increase activity 10% over the next 3-4 weeks.  See me again at that time to make sure patient is able to do well and then fully participate at that time and his triathlons.

## 2018-02-10 ENCOUNTER — Ambulatory Visit: Payer: Self-pay

## 2018-02-10 ENCOUNTER — Encounter: Payer: Self-pay | Admitting: Family Medicine

## 2018-02-10 ENCOUNTER — Ambulatory Visit (INDEPENDENT_AMBULATORY_CARE_PROVIDER_SITE_OTHER): Payer: Medicare Other | Admitting: Family Medicine

## 2018-02-10 VITALS — BP 100/78 | HR 74 | Ht 70.0 in | Wt 160.0 lb

## 2018-02-10 DIAGNOSIS — G8929 Other chronic pain: Secondary | ICD-10-CM | POA: Diagnosis not present

## 2018-02-10 DIAGNOSIS — S83282D Other tear of lateral meniscus, current injury, left knee, subsequent encounter: Secondary | ICD-10-CM

## 2018-02-10 DIAGNOSIS — M25562 Pain in left knee: Secondary | ICD-10-CM

## 2018-02-10 DIAGNOSIS — S138XXA Sprain of joints and ligaments of other parts of neck, initial encounter: Secondary | ICD-10-CM | POA: Diagnosis not present

## 2018-02-10 DIAGNOSIS — M9901 Segmental and somatic dysfunction of cervical region: Secondary | ICD-10-CM | POA: Diagnosis not present

## 2018-02-10 DIAGNOSIS — S39012A Strain of muscle, fascia and tendon of lower back, initial encounter: Secondary | ICD-10-CM | POA: Diagnosis not present

## 2018-02-10 DIAGNOSIS — S29012A Strain of muscle and tendon of back wall of thorax, initial encounter: Secondary | ICD-10-CM | POA: Diagnosis not present

## 2018-02-10 DIAGNOSIS — M9902 Segmental and somatic dysfunction of thoracic region: Secondary | ICD-10-CM | POA: Diagnosis not present

## 2018-02-10 DIAGNOSIS — M9903 Segmental and somatic dysfunction of lumbar region: Secondary | ICD-10-CM | POA: Diagnosis not present

## 2018-02-10 NOTE — Assessment & Plan Note (Signed)
Improvement.  Discussed icing regimen and home exercises.  Discussed which activity to doing which wants to avoid.  Patient is to increase activity slowly over the course the next several days.  Patient will continue with the return to progression with working out.  Follow-up with me again in 3-4 weeks

## 2018-02-10 NOTE — Progress Notes (Signed)
[name] [address] [place], [place] [address] Phone: [phone] Subjective:    I'm seeing this patient by the request  of:    CC: Left knee pain follow-up  NUU:[id number]  Paul [name] is a 67 y.o. male coming in with complaint of left knee pain.  Patient did have a meniscal tear.  Patient underwent a PRP injection 3 weeks ago.  Patient tolerated the procedure well.  States that he is feeling 95% better.  Still some mild discomfort.  Has been running on the lower gravity treadmill that has been helpful.  No pain with radiation such or with walking, biking or swimming     Past Medical History:  Diagnosis Date  . Depression    RESOLVED  . Hyperlipidemia   . Spinal stenosis    WAS HAVING NECK PAIN -PT STATES NERVE ABLATION PROCEDURE 2012 AND PAIN HAS RESOLVED  . Spondylitis [facility])    Past Surgical History:  Procedure Laterality Date  . APPENDECTOMY  1962  . HERNIA REPAIR    . INSERTION OF MESH  10/06/2012   Procedure: INSERTION OF MESH;  Surgeon: Imogene Burn. Georgette Dover, MD;  Location: [facility];  Service: General;  Laterality: N/A;  . [place]   left  . TONSILLECTOMY  1968  . UMBILICAL HERNIA REPAIR  10/06/2012   Procedure: HERNIA REPAIR UMBILICAL ADULT;  Surgeon: Imogene Burn. Georgette Dover, MD;  Location[facility];  Service: General;  Laterality: N/A;   Social History   Socioeconomic History  . Marital status: Married    Spouse name: Not on file  . Number of children: Not on file  . Years of education: Not on file  . Highest education level: Not on file  Occupational History  . Not on file  Social Needs  . Financial resource strain: Not on file  . Food insecurity:    Worry: Not on file    Inability: Not on file  . Transportation needs:    Medical: Not on file    Non-medical: Not on file  Tobacco Use  . Smoking status: Never Smoker  . Smokeless tobacco: Never Used  Substance and Sexual Activity  . Alcohol use: Yes   Comment: OCCAS ALCOHOL  . Drug use: No  . Sexual activity: Not on file  Lifestyle  . Physical activity:    Days per week: Not on file    Minutes per session: Not on file  . Stress: Not on file  Relationships  . Social connections:    Talks on phone: Not on file    Gets together: Not on file    Attends religious service: Not on file    Active member of club or organization: Not on file    Attends meetings of clubs or organizations: Not on file    Relationship status: Not on file  Other Topics Concern  . Not on file  Social History Narrative  . Not on file   Allergies  Allergen Reactions  . Crestor [Rosuvastatin]     MUSCLE CRAMPS  . Lipitor [Atorvastatin]     Liver toxicity   Family History  Problem Relation Age of Onset  . Stroke Mother   . Heart disease Father   . Cancer Sister        bone     Past medical history, social, surgical and family history all reviewed in electronic medical record.  No pertanent information unless stated regarding to the chief complaint.  Review of Systems:Review of systems updated and as accurate as of 02/10/18  No headache, visual changes, nausea, vomiting, diarrhea, constipation, dizziness, abdominal pain, skin rash, fevers, chills, night sweats, weight loss, swollen lymph nodes, body aches, joint swelling, muscle aches, chest pain, shortness of breath, mood changes.   Objective  Blood pressure 100/78, pulse 74, height 5\' 10"  (1.778 m), weight 160 lb (72.6 kg), SpO2 98 %. Systems examined below as of 02/10/18   General: No apparent distress alert and oriented x3 mood and affect normal, dressed appropriately.  HEENT: Pupils equal, extraocular movements intact  Respiratory: Patient's speak in full sentences and does not appear short of breath  Cardiovascular: No lower extremity edema, non tender, no erythema  Skin: Warm dry intact with no signs of infection or rash on extremities or on axial skeleton.  Abdomen: Soft nontender  Neuro:  Cranial nerves II through XII are intact, neurovascularly intact in all extremities with 2+ DTRs and 2+ pulses.  Lymph: No lymphadenopathy of posterior or anterior cervical chain or axillae bilaterally.  Gait normal with good balance and coordination.  MSK:  Non tender with full range of motion and good stability and symmetric strength and tone of shoulders, elbows, wrist, hip, and ankles bilaterally.  Knee: Left Normal to inspection with no erythema or effusion or obvious bony abnormalities. Palpation normal with no warmth, joint line tenderness, patellar tenderness, or condyle tenderness. ROM full in flexion and extension and lower leg rotation. Ligaments with solid consistent endpoints including ACL, PCL, LCL, MCL. Negative Mcmurray's, Apley's, and Thessalonian tests. Non painful patellar compression. Patellar glide without crepitus. Patellar and quadriceps tendons unremarkable. Hamstring and quadriceps strength is normal. Contralateral knee unremarkable   MSK US performed of: left knee  This study was ordered, performed, and interpreted by Charlann Boxer D.O.  Knee: All structures visualized. Anteromedial, meniscus does have no displacement.  Tear still noted.  Significant improvement in hypoechoic changes from surrounding area.  IMPRESSION: Continued meniscal tear noted but no displacement   Impression and Recommendations:     This case required medical decision making of moderate complexity.      Note: This dictation was prepared with Dragon dictation along with smaller phrase technology. Any transcriptional errors that result from this process are unintentional.

## 2018-02-10 NOTE — Patient Instructions (Signed)
Good to see you  Paul [name] is your friend  Keep increasing.  Start at 2 miles outside and increase 1 mile a week  Cross train with treadmill at 75-95% of weight  See me again in 3-4 weeks

## 2018-03-02 DIAGNOSIS — M9903 Segmental and somatic dysfunction of lumbar region: Secondary | ICD-10-CM | POA: Diagnosis not present

## 2018-03-02 DIAGNOSIS — M9902 Segmental and somatic dysfunction of thoracic region: Secondary | ICD-10-CM | POA: Diagnosis not present

## 2018-03-02 DIAGNOSIS — M9901 Segmental and somatic dysfunction of cervical region: Secondary | ICD-10-CM | POA: Diagnosis not present

## 2018-03-02 DIAGNOSIS — S39012A Strain of muscle, fascia and tendon of lower back, initial encounter: Secondary | ICD-10-CM | POA: Diagnosis not present

## 2018-03-02 DIAGNOSIS — S138XXA Sprain of joints and ligaments of other parts of neck, initial encounter: Secondary | ICD-10-CM | POA: Diagnosis not present

## 2018-03-02 DIAGNOSIS — S29012A Strain of muscle and tendon of back wall of thorax, initial encounter: Secondary | ICD-10-CM | POA: Diagnosis not present

## 2018-03-08 NOTE — Progress Notes (Signed)
[name] [address] [place], [place] [address] Phone: [phone] Subjective:     CC: Knee pain follow-up  IEP:PIRJJOACZY  Paul [name] is a 67 y.o. male coming in with complaint of knee pain.  Patient was found to have a chronic meniscal tear noted.  Patient was given as needed pain.  Now 6 weeks out.  Patient states that he ran 6 miles one day and then 10 another. At night he does have some pain. He does note some swelling in the back of the left knee. Really no pain     Past Medical History:  Diagnosis Date  . Depression    RESOLVED  . Hyperlipidemia   . Spinal stenosis    WAS HAVING NECK PAIN -PT STATES NERVE ABLATION PROCEDURE 2012 AND PAIN HAS RESOLVED  . Spondylitis [facility])    Past Surgical History:  Procedure Laterality Date  . APPENDECTOMY  1962  . HERNIA REPAIR    . INSERTION OF MESH  10/06/2012   Procedure: INSERTION OF MESH;  Surgeon: Imogene Burn. Georgette Dover, MD;  Location: [facility];  Service: General;  Laterality: N/A;  . [place]   left  . TONSILLECTOMY  1968  . UMBILICAL HERNIA REPAIR  10/06/2012   Procedure: HERNIA REPAIR UMBILICAL ADULT;  Surgeon: Imogene Burn. Georgette Dover, MD;  Location: [facility];  Service: General;  Laterality: N/A;   Social History   Socioeconomic History  . Marital status: Married    Spouse name: Not on file  . Number of children: Not on file  . Years of education: Not on file  . Highest education level: Not on file  Occupational History  . Not on file  Social Needs  . Financial resource strain: Not on file  . Food insecurity:    Worry: Not on file    Inability: Not on file  . Transportation needs:    Medical: Not on file    Non-medical: Not on file  Tobacco Use  . Smoking status: Never Smoker  . Smokeless tobacco: Never Used  Substance and Sexual Activity  . Alcohol use: Yes    Comment: OCCAS ALCOHOL  . Drug use: No  . Sexual activity: Not on file  Lifestyle  . Physical activity:   Days per week: Not on file    Minutes per session: Not on file  . Stress: Not on file  Relationships  . Social connections:    Talks on phone: Not on file    Gets together: Not on file    Attends religious service: Not on file    Active member of club or organization: Not on file    Attends meetings of clubs or organizations: Not on file    Relationship status: Not on file  Other Topics Concern  . Not on file  Social History Narrative  . Not on file   Allergies  Allergen Reactions  . Crestor [Rosuvastatin]     MUSCLE CRAMPS  . Lipitor [Atorvastatin]     Liver toxicity   Family History  Problem Relation Age of Onset  . Stroke Mother   . Heart disease Father   . Cancer Sister        bone     Past medical history, social, surgical and family history all reviewed in electronic medical record.  No pertanent information unless stated regarding to the chief complaint.   Review of Systems:Review of systems updated and as accurate as of 03/09/18  No headache, visual changes, nausea, vomiting, diarrhea, constipation, dizziness, abdominal pain, skin rash, fevers, chills, night sweats, weight loss, swollen lymph nodes, body aches, joint swelling, muscle aches, chest pain, shortness of breath, mood changes.   Objective  Blood pressure 132/64, pulse (!) 57, SpO2 98 %. Systems examined below as of 03/09/18   General: No apparent distress alert and oriented x3 mood and affect normal, dressed appropriately.  HEENT: Pupils equal, extraocular movements intact  Respiratory: Patient's speak in full sentences and does not appear short of breath  Cardiovascular: No lower extremity edema, non tender, no erythema  Skin: Warm dry intact with no signs of infection or rash on extremities or on axial skeleton.  Abdomen: Soft nontender  Neuro: Cranial nerves II through XII are intact, neurovascularly intact in all extremities with 2+ DTRs and 2+ pulses.  Lymph: No lymphadenopathy of posterior or  anterior cervical chain or axillae bilaterally.  Gait normal with good balance and coordination.  MSK:  Non tender with full range of motion and good stability and symmetric strength and tone of shoulders, elbows, wrist, hip, and ankles bilaterally.  Knee: Right Normal to inspection with no erythema or effusion or obvious bony abnormalities. Palpation normal with no warmth, joint line tenderness, patellar tenderness, or condyle tenderness. ROM full in flexion and extension and lower leg rotation. Ligaments with solid consistent endpoints including ACL, PCL, LCL, MCL. Very minimally positive Mcmurray's, Apley's, and Thessalonian tests. With mild painful patellar compression. Patellar glide with mild crepitus. Patellar and quadriceps tendons unremarkable. Hamstring and quadriceps strength is normal. Contralateral knee unremarkable      Impression and Recommendations:     This case required medical decision making of moderate complexity.      Note: This dictation was prepared with Dragon dictation along with smaller phrase technology. Any transcriptional errors that result from this process are unintentional.

## 2018-03-09 ENCOUNTER — Ambulatory Visit (INDEPENDENT_AMBULATORY_CARE_PROVIDER_SITE_OTHER): Payer: Medicare Other | Admitting: Family Medicine

## 2018-03-09 DIAGNOSIS — S83282D Other tear of lateral meniscus, current injury, left knee, subsequent encounter: Secondary | ICD-10-CM

## 2018-03-09 NOTE — Patient Instructions (Signed)
Good to see you  Ice is your friend    

## 2018-03-09 NOTE — Assessment & Plan Note (Signed)
Lateral meniscal tear.  Significant healing from previous exam.  Patient is able to start increasing activity as tolerated.  We discussed that some of it could be more of a distal hamstring tenderness.  Follow-up again 4 weeks if any pain remains

## 2018-03-25 DIAGNOSIS — S29012A Strain of muscle and tendon of back wall of thorax, initial encounter: Secondary | ICD-10-CM | POA: Diagnosis not present

## 2018-03-25 DIAGNOSIS — M9902 Segmental and somatic dysfunction of thoracic region: Secondary | ICD-10-CM | POA: Diagnosis not present

## 2018-03-25 DIAGNOSIS — S138XXA Sprain of joints and ligaments of other parts of neck, initial encounter: Secondary | ICD-10-CM | POA: Diagnosis not present

## 2018-03-25 DIAGNOSIS — M9903 Segmental and somatic dysfunction of lumbar region: Secondary | ICD-10-CM | POA: Diagnosis not present

## 2018-03-25 DIAGNOSIS — M9901 Segmental and somatic dysfunction of cervical region: Secondary | ICD-10-CM | POA: Diagnosis not present

## 2018-03-25 DIAGNOSIS — S39012A Strain of muscle, fascia and tendon of lower back, initial encounter: Secondary | ICD-10-CM | POA: Diagnosis not present

## 2018-04-10 DIAGNOSIS — S138XXA Sprain of joints and ligaments of other parts of neck, initial encounter: Secondary | ICD-10-CM | POA: Diagnosis not present

## 2018-04-10 DIAGNOSIS — M9901 Segmental and somatic dysfunction of cervical region: Secondary | ICD-10-CM | POA: Diagnosis not present

## 2018-04-10 DIAGNOSIS — M9902 Segmental and somatic dysfunction of thoracic region: Secondary | ICD-10-CM | POA: Diagnosis not present

## 2018-04-10 DIAGNOSIS — S29012A Strain of muscle and tendon of back wall of thorax, initial encounter: Secondary | ICD-10-CM | POA: Diagnosis not present

## 2018-04-10 DIAGNOSIS — S39012A Strain of muscle, fascia and tendon of lower back, initial encounter: Secondary | ICD-10-CM | POA: Diagnosis not present

## 2018-04-10 DIAGNOSIS — M9903 Segmental and somatic dysfunction of lumbar region: Secondary | ICD-10-CM | POA: Diagnosis not present

## 2018-04-18 ENCOUNTER — Other Ambulatory Visit: Payer: Self-pay

## 2018-04-18 ENCOUNTER — Inpatient Hospital Stay (HOSPITAL_COMMUNITY)
Admission: EM | Admit: 2018-04-18 | Discharge: 2018-04-21 | DRG: 200 | Disposition: A | Discharge status: Home / Self Care | Payer: No Typology Code available for payment source | Attending: General Surgery | Admitting: General Surgery

## 2018-04-18 ENCOUNTER — Emergency Department (HOSPITAL_COMMUNITY): Payer: No Typology Code available for payment source

## 2018-04-18 DIAGNOSIS — S0990XA Unspecified injury of head, initial encounter: Secondary | ICD-10-CM | POA: Diagnosis not present

## 2018-04-18 DIAGNOSIS — R402412 Glasgow coma scale score 13-15, at arrival to emergency department: Secondary | ICD-10-CM | POA: Diagnosis present

## 2018-04-18 DIAGNOSIS — S3993XA Unspecified injury of pelvis, initial encounter: Secondary | ICD-10-CM | POA: Diagnosis not present

## 2018-04-18 DIAGNOSIS — I959 Hypotension, unspecified: Secondary | ICD-10-CM | POA: Diagnosis not present

## 2018-04-18 DIAGNOSIS — S40812A Abrasion of left upper arm, initial encounter: Secondary | ICD-10-CM | POA: Diagnosis present

## 2018-04-18 DIAGNOSIS — S2221XA Fracture of manubrium, initial encounter for closed fracture: Secondary | ICD-10-CM | POA: Diagnosis present

## 2018-04-18 DIAGNOSIS — S299XXA Unspecified injury of thorax, initial encounter: Secondary | ICD-10-CM | POA: Diagnosis not present

## 2018-04-18 DIAGNOSIS — M545 Low back pain: Secondary | ICD-10-CM | POA: Diagnosis not present

## 2018-04-18 DIAGNOSIS — S199XXA Unspecified injury of neck, initial encounter: Secondary | ICD-10-CM | POA: Diagnosis not present

## 2018-04-18 DIAGNOSIS — S270XXA Traumatic pneumothorax, initial encounter: Principal | ICD-10-CM | POA: Diagnosis present

## 2018-04-18 DIAGNOSIS — E7800 Pure hypercholesterolemia, unspecified: Secondary | ICD-10-CM | POA: Diagnosis present

## 2018-04-18 DIAGNOSIS — Z4682 Encounter for fitting and adjustment of non-vascular catheter: Secondary | ICD-10-CM | POA: Diagnosis not present

## 2018-04-18 DIAGNOSIS — S27321A Contusion of lung, unilateral, initial encounter: Secondary | ICD-10-CM | POA: Diagnosis present

## 2018-04-18 DIAGNOSIS — R58 Hemorrhage, not elsewhere classified: Secondary | ICD-10-CM | POA: Diagnosis not present

## 2018-04-18 DIAGNOSIS — S2242XA Multiple fractures of ribs, left side, initial encounter for closed fracture: Secondary | ICD-10-CM | POA: Diagnosis not present

## 2018-04-18 DIAGNOSIS — J939 Pneumothorax, unspecified: Secondary | ICD-10-CM

## 2018-04-18 DIAGNOSIS — S3991XA Unspecified injury of abdomen, initial encounter: Secondary | ICD-10-CM | POA: Diagnosis not present

## 2018-04-18 DIAGNOSIS — R102 Pelvic and perineal pain: Secondary | ICD-10-CM | POA: Diagnosis not present

## 2018-04-18 DIAGNOSIS — S060X9A Concussion with loss of consciousness of unspecified duration, initial encounter: Secondary | ICD-10-CM | POA: Diagnosis present

## 2018-04-18 DIAGNOSIS — S060X0A Concussion without loss of consciousness, initial encounter: Secondary | ICD-10-CM | POA: Diagnosis not present

## 2018-04-18 DIAGNOSIS — Y9355 Activity, bike riding: Secondary | ICD-10-CM | POA: Diagnosis not present

## 2018-04-18 DIAGNOSIS — S2249XA Multiple fractures of ribs, unspecified side, initial encounter for closed fracture: Secondary | ICD-10-CM

## 2018-04-18 DIAGNOSIS — I499 Cardiac arrhythmia, unspecified: Secondary | ICD-10-CM | POA: Diagnosis not present

## 2018-04-18 DIAGNOSIS — R079 Chest pain, unspecified: Secondary | ICD-10-CM | POA: Diagnosis not present

## 2018-04-18 DIAGNOSIS — R52 Pain, unspecified: Secondary | ICD-10-CM | POA: Diagnosis not present

## 2018-04-18 LAB — I-STAT CHEM 8, ED
BUN: 28 mg/dL — ABNORMAL HIGH (ref 6–20)
CALCIUM ION: 1.07 mmol/L — AB (ref 1.15–1.40)
Chloride: 106 mmol/L (ref 101–111)
Creatinine, Ser: 0.8 mg/dL (ref 0.61–1.24)
GLUCOSE: 170 mg/dL — AB (ref 65–99)
HCT: 41 % (ref 39.0–52.0)
HEMOGLOBIN: 13.9 g/dL (ref 13.0–17.0)
Potassium: 5.6 mmol/L — ABNORMAL HIGH (ref 3.5–5.1)
Sodium: 138 mmol/L (ref 135–145)
TCO2: 29 mmol/L (ref 22–32)

## 2018-04-18 LAB — URINALYSIS, ROUTINE W REFLEX MICROSCOPIC
BILIRUBIN URINE: NEGATIVE
Glucose, UA: NEGATIVE mg/dL
KETONES UR: 20 mg/dL — AB
LEUKOCYTES UA: NEGATIVE
NITRITE: NEGATIVE
PH: 5 (ref 5.0–8.0)
Protein, ur: NEGATIVE mg/dL
Specific Gravity, Urine: >1.046 — ABNORMAL HIGH (ref 1.005–1.030)

## 2018-04-18 LAB — CREATININE, SERUM
CREATININE: 0.88 mg/dL (ref 0.61–1.24)
GFR calc Af Amer: >60 mL/min (ref >60)
GFR calc non Af Amer: >60 mL/min (ref >60)

## 2018-04-18 LAB — COMPREHENSIVE METABOLIC PANEL
ALT: 21 U/L (ref 17–63)
AST: 41 U/L (ref 15–41)
Albumin: 3.8 g/dL (ref 3.5–5.0)
Alkaline Phosphatase: 47 U/L (ref 38–126)
Anion gap: 10 (ref 5–15)
BUN: 19 mg/dL (ref 6–20)
CHLORIDE: 107 mmol/L (ref 101–111)
CO2: 25 mmol/L (ref 22–32)
Calcium: 9.5 mg/dL (ref 8.9–10.3)
Creatinine, Ser: 0.9 mg/dL (ref 0.61–1.24)
Glucose, Bld: 165 mg/dL — ABNORMAL HIGH (ref 65–99)
POTASSIUM: 5.4 mmol/L — AB (ref 3.5–5.1)
Sodium: 142 mmol/L (ref 135–145)
Total Bilirubin: 1.4 mg/dL — ABNORMAL HIGH (ref 0.3–1.2)
Total Protein: 6.1 g/dL — ABNORMAL LOW (ref 6.5–8.1)

## 2018-04-18 LAB — ETHANOL: Alcohol, Ethyl (B): <10 mg/dL (ref <10)

## 2018-04-18 LAB — CBC
HCT: 42.7 % (ref 39.0–52.0)
HEMATOCRIT: 42.6 % (ref 39.0–52.0)
Hemoglobin: 14.2 g/dL (ref 13.0–17.0)
Hemoglobin: 14.2 g/dL (ref 13.0–17.0)
MCH: 32.3 pg (ref 26.0–34.0)
MCH: 32 pg (ref 26.0–34.0)
MCHC: 33.3 g/dL (ref 30.0–36.0)
MCHC: 33.3 g/dL (ref 30.0–36.0)
MCV: 96.8 fL (ref 78.0–100.0)
MCV: 96.2 fL (ref 78.0–100.0)
PLATELETS: 227 10*3/uL (ref 150–400)
PLATELETS: 218 10*3/uL (ref 150–400)
RBC: 4.4 MIL/uL (ref 4.22–5.81)
RBC: 4.44 MIL/uL (ref 4.22–5.81)
RDW: 12.6 % (ref 11.5–15.5)
RDW: 12.5 % (ref 11.5–15.5)
WBC: 9.4 10*3/uL (ref 4.0–10.5)
WBC: 11.9 10*3/uL — ABNORMAL HIGH (ref 4.0–10.5)

## 2018-04-18 LAB — SAMPLE TO BLOOD BANK

## 2018-04-18 LAB — CDS SEROLOGY

## 2018-04-18 LAB — PROTIME-INR
INR: 1.05
Prothrombin Time: 13.6 seconds (ref 11.4–15.2)

## 2018-04-18 LAB — MRSA PCR SCREENING: MRSA by PCR: NEGATIVE

## 2018-04-18 LAB — I-STAT CG4 LACTIC ACID, ED: Lactic Acid, Venous: 1.87 mmol/L (ref 0.5–1.9)

## 2018-04-18 MED ORDER — MORPHINE SULFATE (PF) 4 MG/ML IV SOLN
4.0000 mg | Freq: Once | INTRAVENOUS | Status: AC
  Administered 2018-04-18: 4 mg via INTRAVENOUS
  Filled 2018-04-18: qty 1

## 2018-04-18 MED ORDER — IOHEXOL 300 MG/ML  SOLN
100.0000 mL | Freq: Once | INTRAMUSCULAR | Status: AC | PRN
  Administered 2018-04-18: 100 mL via INTRAVENOUS

## 2018-04-18 MED ORDER — OXYCODONE HCL 5 MG PO TABS
10.0000 mg | ORAL_TABLET | ORAL | Status: DC | PRN
  Administered 2018-04-18 – 2018-04-19 (×2): 10 mg via ORAL
  Filled 2018-04-18 (×2): qty 2

## 2018-04-18 MED ORDER — PROCHLORPERAZINE EDISYLATE 10 MG/2ML IJ SOLN
5.0000 mg | Freq: Four times a day (QID) | INTRAMUSCULAR | Status: DC | PRN
  Administered 2018-04-18: 5 mg via INTRAVENOUS
  Filled 2018-04-18: qty 2

## 2018-04-18 MED ORDER — HYDROMORPHONE HCL 2 MG/ML IJ SOLN: 0.5000 mg | INTRAMUSCULAR | Status: DC | PRN

## 2018-04-18 MED ORDER — DEXTROSE-NACL 5-0.45 % IV SOLN
INTRAVENOUS | Status: DC
  Administered 2018-04-18 – 2018-04-19 (×2): via INTRAVENOUS

## 2018-04-18 MED ORDER — ENOXAPARIN SODIUM 40 MG/0.4ML ~~LOC~~ SOLN
40.0000 mg | SUBCUTANEOUS | Status: DC
  Administered 2018-04-19 – 2018-04-21 (×3): 40 mg via SUBCUTANEOUS
  Filled 2018-04-18 (×3): qty 0.4

## 2018-04-18 MED ORDER — OXYCODONE HCL 5 MG PO TABS
5.0000 mg | ORAL_TABLET | ORAL | Status: DC | PRN
  Administered 2018-04-18 – 2018-04-19 (×2): 5 mg via ORAL
  Filled 2018-04-18 (×2): qty 1

## 2018-04-18 MED ORDER — HYDROMORPHONE HCL 1 MG/ML IJ SOLN
0.5000 mg | INTRAMUSCULAR | Status: DC | PRN
  Administered 2018-04-18: 1 mg via INTRAVENOUS
  Administered 2018-04-19 (×2): 2 mg via INTRAVENOUS
  Filled 2018-04-18 (×2): qty 2
  Filled 2018-04-18: qty 1

## 2018-04-18 MED ORDER — BISACODYL 10 MG RE SUPP: 10.0000 mg | Freq: Every day | RECTAL | Status: DC | PRN

## 2018-04-18 MED ORDER — BACITRACIN ZINC 500 UNIT/GM EX OINT
TOPICAL_OINTMENT | Freq: Two times a day (BID) | CUTANEOUS | Status: DC
  Administered 2018-04-19 – 2018-04-21 (×5): via TOPICAL
  Filled 2018-04-18 (×2): qty 28.4

## 2018-04-18 MED ORDER — PROCHLORPERAZINE MALEATE 10 MG PO TABS
10.0000 mg | ORAL_TABLET | Freq: Four times a day (QID) | ORAL | Status: DC | PRN
  Filled 2018-04-18: qty 1

## 2018-04-18 MED ORDER — DOCUSATE SODIUM 100 MG PO CAPS
100.0000 mg | ORAL_CAPSULE | Freq: Two times a day (BID) | ORAL | Status: DC
  Administered 2018-04-19 – 2018-04-21 (×5): 100 mg via ORAL
  Filled 2018-04-18 (×5): qty 1

## 2018-04-18 MED ORDER — ACETAMINOPHEN 325 MG PO TABS: 650.0000 mg | ORAL_TABLET | ORAL | Status: DC | PRN

## 2018-04-18 MED ORDER — MORPHINE SULFATE (PF) 4 MG/ML IV SOLN
2.0000 mg | Freq: Once | INTRAVENOUS | Status: AC
  Administered 2018-04-18: 2 mg via INTRAVENOUS
  Filled 2018-04-18: qty 1

## 2018-04-18 MED ORDER — MORPHINE SULFATE (PF) 4 MG/ML IV SOLN
6.0000 mg | Freq: Once | INTRAVENOUS | Status: AC
Start: 2018-04-18 — End: 2018-04-18
  Administered 2018-04-18: 6 mg via INTRAVENOUS
  Filled 2018-04-18: qty 2

## 2018-04-18 MED ORDER — TETANUS-DIPHTH-ACELL PERTUSSIS 5-2.5-18.5 LF-MCG/0.5 IM SUSP
0.5000 mL | Freq: Once | INTRAMUSCULAR | Status: AC
  Administered 2018-04-18: 0.5 mL via INTRAMUSCULAR
  Filled 2018-04-18: qty 0.5
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
## 2018-04-18 NOTE — Progress Notes (Signed)
Received patient from the ED approx 1600 alert, oriented x3 able to move all extremities without any difficulty except left shoulder and side at chest tube insertions site.  Chest tube connected and put to 40 mm suction. Patient's O2 sats would drop intermittently to 86% and with encouragement agreed to po pain pill.  Approx 45 mins later patient became nauseated gave compazine and Dilaudid and now resting much better.  Instructed patient and wife about body's defense mechanism with adrenaline now decreasing and patient now feeling pain. Received orders for IV fluids. CHG bath given .
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
## 2018-04-18 NOTE — Progress Notes (Signed)
   04/18/18 1100  Clinical Encounter Type  Visited With Patient;Health care provider  Visit Type Trauma  Referral From Nurse  Consult/Referral To Chaplain  Spiritual Encounters  Spiritual Needs Emotional  Stress Factors  Patient Stress Factors Health changes  Chaplain spoke with EMS while PT was being tended to by medical staff, wife of PT has been called but transit status is unknown

## 2018-04-18 NOTE — ED Notes (Signed)
Attempted report x1. 

## 2018-04-18 NOTE — Op Note (Signed)
Chest Tube Insertion Procedure Note  Indications:  Clinically significant Pneumothorax  Pre-operative Diagnosis: Pneumothorax on left  Post-operative Diagnosis: same  Procedure Details  Informed consent was obtained for the procedure.    After sterile skin prep, using standard technique, a 14 French tube was placed in the left anterior 4th rib space.  Findings: pneumothorax  Estimated Blood Loss:  Minimal         Specimens:  None              Complications:  None; patient tolerated the procedure well.         Disposition: in ED room stable         Condition: stable  Attending Attestation: I performed the procedure.
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
## 2018-04-18 NOTE — ED Notes (Signed)
Patient transported to CT 

## 2018-04-19 ENCOUNTER — Inpatient Hospital Stay (HOSPITAL_COMMUNITY): Payer: No Typology Code available for payment source

## 2018-04-19 ENCOUNTER — Encounter (HOSPITAL_COMMUNITY): Payer: Self-pay | Admitting: Speech Pathology

## 2018-04-19 LAB — BASIC METABOLIC PANEL
ANION GAP: 6 (ref 5–15)
BUN: 14 mg/dL (ref 6–20)
CO2: 27 mmol/L (ref 22–32)
Calcium: 8.7 mg/dL — ABNORMAL LOW (ref 8.9–10.3)
Chloride: 106 mmol/L (ref 101–111)
Creatinine, Ser: 0.7 mg/dL (ref 0.61–1.24)
Glucose, Bld: 148 mg/dL — ABNORMAL HIGH (ref 65–99)
POTASSIUM: 3.3 mmol/L — AB (ref 3.5–5.1)
SODIUM: 139 mmol/L (ref 135–145)

## 2018-04-19 LAB — CBC
HCT: 38.7 % — ABNORMAL LOW (ref 39.0–52.0)
Hemoglobin: 12.7 g/dL — ABNORMAL LOW (ref 13.0–17.0)
MCH: 32 pg (ref 26.0–34.0)
MCHC: 32.8 g/dL (ref 30.0–36.0)
MCV: 97.5 fL (ref 78.0–100.0)
PLATELETS: 192 10*3/uL (ref 150–400)
RBC: 3.97 MIL/uL — AB (ref 4.22–5.81)
RDW: 12.7 % (ref 11.5–15.5)
WBC: 9.9 10*3/uL (ref 4.0–10.5)

## 2018-04-19 LAB — HIV ANTIBODY (ROUTINE TESTING W REFLEX): HIV SCREEN 4TH GENERATION: NONREACTIVE

## 2018-04-19 MED ORDER — HYDROMORPHONE HCL 1 MG/ML IJ SOLN: 0.5000 mg | INTRAMUSCULAR | Status: DC | PRN

## 2018-04-19 MED ORDER — IBUPROFEN 200 MG PO TABS
800.0000 mg | ORAL_TABLET | Freq: Three times a day (TID) | ORAL | Status: DC | PRN
  Administered 2018-04-20: 800 mg via ORAL
  Filled 2018-04-19: qty 4

## 2018-04-19 MED ORDER — METHOCARBAMOL 500 MG PO TABS: 750.0000 mg | ORAL_TABLET | Freq: Three times a day (TID) | ORAL | Status: DC | PRN

## 2018-04-19 MED ORDER — OXYCODONE HCL 5 MG PO TABS
5.0000 mg | ORAL_TABLET | ORAL | Status: DC | PRN
  Administered 2018-04-19: 10 mg via ORAL
  Administered 2018-04-19: 5 mg via ORAL
  Administered 2018-04-20: 10 mg via ORAL
  Administered 2018-04-20: 5 mg via ORAL
  Filled 2018-04-19 (×2): qty 2
  Filled 2018-04-19 (×2): qty 1
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
## 2018-04-19 NOTE — Progress Notes (Signed)
1900: Handoff report received from RN. Pt resting in bed. Discussed plan of care for the shift; pt amenable to plan.  0000: Pt resting comfortably.  0300: Pt c/o stomach discomfort "like a ball;" denies nausea. Pt is helped to the bathroom and is able to burp several times. On call trauma provider Grandville Silos, MD paged for Maalox and updated.  0400: Pt now c/o "severe stomach cramps." MD aware.  0500: Pt able to pass flatus and endorses some relief from cramps.  0700: Handoff report given to RN. No acute events overnight.
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
## 2018-04-20 ENCOUNTER — Inpatient Hospital Stay (HOSPITAL_COMMUNITY): Payer: No Typology Code available for payment source

## 2018-04-20 ENCOUNTER — Encounter (HOSPITAL_COMMUNITY): Payer: Self-pay | Admitting: *Deleted

## 2018-04-20 ENCOUNTER — Other Ambulatory Visit: Payer: Self-pay

## 2018-04-20 LAB — CBC
HCT: 38.8 % — ABNORMAL LOW (ref 39.0–52.0)
Hemoglobin: 12.8 g/dL — ABNORMAL LOW (ref 13.0–17.0)
MCH: 31.7 pg (ref 26.0–34.0)
MCHC: 33 g/dL (ref 30.0–36.0)
MCV: 96 fL (ref 78.0–100.0)
PLATELETS: 173 10*3/uL (ref 150–400)
RBC: 4.04 MIL/uL — ABNORMAL LOW (ref 4.22–5.81)
RDW: 12.2 % (ref 11.5–15.5)
WBC: 7.6 10*3/uL (ref 4.0–10.5)

## 2018-04-20 MED ORDER — ALUM & MAG HYDROXIDE-SIMETH 200-200-20 MG/5ML PO SUSP
30.0000 mL | ORAL | Status: DC | PRN
  Administered 2018-04-20 (×2): 30 mL via ORAL
  Filled 2018-04-20 (×2): qty 30

## 2018-04-20 MED ORDER — ACETAMINOPHEN 500 MG PO TABS
1000.0000 mg | ORAL_TABLET | Freq: Three times a day (TID) | ORAL | Status: DC
  Administered 2018-04-20 – 2018-04-21 (×4): 1000 mg via ORAL
  Filled 2018-04-20 (×4): qty 2

## 2018-04-20 MED ORDER — METHOCARBAMOL 500 MG PO TABS
750.0000 mg | ORAL_TABLET | Freq: Three times a day (TID) | ORAL | Status: DC
  Administered 2018-04-20 – 2018-04-21 (×4): 750 mg via ORAL
  Filled 2018-04-20 (×4): qty 2

## 2018-04-20 NOTE — Discharge Instructions (Signed)
Pneumothorax °A pneumothorax, commonly called a collapsed lung, is a condition in which air leaks from a lung and builds up in the space between the lung and the chest wall (pleural space). The air in a pneumothorax is trapped outside the lung and takes up space, preventing the lung from fully expanding. This is a condition that usually occurs suddenly. The buildup of air may be small or large. A small pneumothorax may go away on its own. When a pneumothorax is larger, it will often require medical treatment and hospitalization. °What are the causes? °A pneumothorax can sometimes happen quickly with no apparent cause. People with underlying lung problems, particularly COPD or emphysema, are at higher risk of pneumothorax. However, pneumothorax can happen quickly even in people with no prior known lung problems. Trauma, surgery, medical procedures, or injury to the chest wall can also cause a pneumothorax. °What are the signs or symptoms? °Sometimes a pneumothorax will have no symptoms. When symptoms are present, they can include: °· Chest pain. °· Shortness of breath. °· Increased rate of breathing. °· Bluish color to your lips or skin (cyanosis). ° °How is this diagnosed? °Pneumothorax is usually diagnosed by a chest X-ray or chest CT scan. Your health care provider will also take a medical history and perform a physical exam to determine why you may have a pneumothorax. °How is this treated? °A small pneumothorax may go away on its own without treatment. Extra oxygen can sometimes help a small pneumothorax go away more quickly. For a larger pneumothorax or a pneumothorax that is causing symptoms, a procedure is usually needed to drain the air. In some cases, the health care provider may drain the air using a needle. In other cases, a chest tube may be inserted into the pleural space. A chest tube is a small tube placed between the ribs and into the pleural space. This removes the extra air and allows the lung to  expand back to its normal size. A large pneumothorax will usually require a hospital stay. If there is ongoing air leakage into the pleural space, then the chest tube may need to remain in place for several days until the air leak has healed. In some cases, surgery may be needed. °Follow these instructions at home: °· Only take over-the-counter or prescription medicines as directed by your health care provider. °· If a cough or pain makes it difficult for you to sleep at night, try sleeping in a semi-upright position in a recliner or by using 2 or 3 pillows. °· Rest and limit activity as directed by your health care provider. °· If you had a chest tube and it was removed, ask your health care provider when it is okay to remove the dressing. Until your health care provider says you can remove the dressing, do not allow it to get wet. °· Do not smoke. Smoking is a risk factor for pneumothorax. °· Do not fly in an airplane or scuba dive until your health care provider says it is okay. °· Follow up with your health care provider as directed. °Get help right away if: °· You have increasing chest pain or shortness of breath. °· You have a cough that is not controlled with suppressants. °· You begin coughing up blood. °· You have pain that is getting worse or is not controlled with medicines. °· You cough up thick, discolored mucus (sputum) that is yellow to green in color. °· You have redness, increasing pain, or discharge at the site where a   chest tube had been in place (if your pneumothorax was treated with a chest tube).  The site where your chest tube was located opens up.  You feel air coming out of the site where the chest tube was placed.  You have a fever or persistent symptoms for more than 2-3 days.  You have a fever and your symptoms suddenly get worse. This information is not intended to replace advice given to you by your health care provider. Make sure you discuss any questions you have with your  health care provider. Document Released: 11/04/2005 Document Revised: 04/11/2016 Document Reviewed: 03/30/2014 Elsevier Interactive Patient Education  [employer].  1. PAIN CONTROL:  1. Pain is best controlled by a usual combination of three different methods TOGETHER:  1. Ice/Heat 2. Over the counter pain medication 3. Prescription pain medication 2. Most patients will experience some swelling and bruising around wounds. Ice packs or heating pads (30-60 minutes up to 6 times a day) will help. Use ice for the first few days to help decrease swelling and bruising, then switch to heat to help relax tight/sore spots and speed recovery. Some people prefer to use ice alone, heat alone, alternating between ice & heat. Experiment to what works for you. Swelling and bruising can take several weeks to resolve.  3. It is helpful to take an over-the-counter pain medication regularly for the first few weeks. Choose one of the following that works best for you:  1. Naproxen (Aleve, etc) Two 220mg  tabs twice a day 2. Ibuprofen (Advil, etc) Three 200mg  tabs four times a day (every meal & bedtime) 3. Acetaminophen (Tylenol, etc) 500-650mg  four times a day (every meal & bedtime) 4. A prescription for pain medication (such as oxycodone, hydrocodone, etc) should be given to you upon discharge. Take your pain medication as prescribed.  1. If you are having problems/concerns with the prescription medicine (does not control pain, nausea, vomiting, rash, itching, etc), please call us [phone] to see if we need to switch you to a different pain medicine that will work better for you and/or control your side effect better. 2. If you need a refill on your pain medication, please contact your pharmacy. They will contact our office to request authorization. Prescriptions will not be filled after 5 pm or on week-ends. 4. Avoid getting constipated. When taking pain medications, it is common to experience some  constipation. Increasing fluid intake and taking a fiber supplement (such as Metamucil, Citrucel, FiberCon, MiraLax, etc) 1-2 times a day regularly will usually help prevent this problem from occurring. A mild laxative (prune juice, Milk of Magnesia, MiraLax, etc) should be taken according to package directions if there are no bowel movements after 48 hours.  5. Watch out for diarrhea. If you have many loose bowel movements, simplify your diet to bland foods & liquids for a few days. Stop any stool softeners and decrease your fiber supplement. Switching to mild anti-diarrheal medications (Kayopectate, Pepto Bismol) can help. If this worsens or does not improve, please call us. 6. Wash / shower every day. You may shower daily and replace your bandges after showering. No bathing or submerging your wounds in water until they heal. 7. FOLLOW UP in our office  1. Please call [facility] at [phone] to set up an appointment for a follow-up appointment approximately 2-3 weeks after discharge for wound check  WHEN TO CALL us [phone]:  1. Poor pain control 2. Reactions / problems with new medications (rash/itching, nausea,  etc)  3. Fever over 101.5 F (38.5 C) 4. Worsening swelling or bruising 5. Continued bleeding from wounds. 6. Increased pain, redness, or drainage from the wounds which could be signs of infection  The clinic staff is available to answer your questions during regular business hours (8:30am-5pm). Please dont hesitate to call and ask to speak to one of our nurses for clinical concerns.  If you have a medical emergency, go to the nearest emergency room or call 911.  A surgeon from [facility] is always on call at the [facility], [address], [place], [place], [place] [address] ?  MAIN: [phone] ? TOLL FREE: [phone] ?  FAX [phone]  [email or web address]

## 2018-04-20 NOTE — Progress Notes (Signed)
1900: Handoff report received from RN. Pt resting in bed. Discussed plan of care for the shift; pt amenable to plan. Pt still c/o gas and stomach discomfort, but less so than last night.  0000: Pt resting comfortably.  0400: Pt continues resting comfortably.  0700: Handoff report given to RN. No acute events overnight.
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
## 2018-04-21 ENCOUNTER — Inpatient Hospital Stay (HOSPITAL_COMMUNITY): Payer: No Typology Code available for payment source

## 2018-04-21 MED ORDER — METHOCARBAMOL 750 MG PO TABS: 750.0000 mg | ORAL_TABLET | Freq: Three times a day (TID) | ORAL | 0 refills | Status: DC | PRN

## 2018-04-21 MED ORDER — ACETAMINOPHEN 500 MG PO TABS: 1000.0000 mg | ORAL_TABLET | Freq: Three times a day (TID) | ORAL | 0 refills | Status: DC | PRN

## 2018-04-21 MED ORDER — OXYCODONE HCL 5 MG PO TABS: 5.0000 mg | ORAL_TABLET | Freq: Four times a day (QID) | ORAL | 0 refills | Status: DC | PRN

## 2018-04-21 MED ORDER — IBUPROFEN 800 MG PO TABS: 800.0000 mg | ORAL_TABLET | Freq: Three times a day (TID) | ORAL | 0 refills | Status: DC | PRN
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
## 2018-04-21 NOTE — Progress Notes (Signed)
PM film reviewed with Dr. Grandville Silos.  It was his opinion patient can go home.  We discussed discharge instructions with him and he knows to come back if he has any shortness of breath.  Follow up was also discussed, all questions answered.
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
## 2018-04-21 NOTE — Progress Notes (Signed)
He is having some issues with standing and transitioning from one position to the next.  PT has worked with him and we are setting him up with PT for vestibular issues on discharge.  PT feels he is safe walking, but agree with follow up with outpatient PT.
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
## 2018-04-21 NOTE — Care Management Important Message (Signed)
Important Message  Patient Details  Name: Paul [name] MRN: [id number] Date of Birth: [birth year]   Medicare Important Message Given:  Yes    Orbie Pyo 04/21/2018, 3:15 PM
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
## 2018-04-22 ENCOUNTER — Encounter: Payer: Self-pay | Admitting: Family Medicine

## 2018-04-27 ENCOUNTER — Telehealth: Payer: Self-pay

## 2018-04-27 ENCOUNTER — Ambulatory Visit: Payer: Self-pay

## 2018-04-27 ENCOUNTER — Ambulatory Visit: Payer: PRIVATE HEALTH INSURANCE | Admitting: Family Medicine

## 2018-04-27 ENCOUNTER — Ambulatory Visit (INDEPENDENT_AMBULATORY_CARE_PROVIDER_SITE_OTHER): Payer: Medicare Other | Admitting: Family Medicine

## 2018-04-27 VITALS — BP 138/80 | HR 57 | Ht 70.0 in | Wt 158.0 lb

## 2018-04-27 DIAGNOSIS — M25519 Pain in unspecified shoulder: Secondary | ICD-10-CM | POA: Insufficient documentation

## 2018-04-27 DIAGNOSIS — M25512 Pain in left shoulder: Secondary | ICD-10-CM | POA: Diagnosis not present

## 2018-04-27 DIAGNOSIS — S7002XA Contusion of left hip, initial encounter: Secondary | ICD-10-CM | POA: Diagnosis not present

## 2018-04-27 DIAGNOSIS — S060X9A Concussion with loss of consciousness of unspecified duration, initial encounter: Secondary | ICD-10-CM | POA: Diagnosis not present

## 2018-04-27 DIAGNOSIS — S060XAA Concussion with loss of consciousness status unknown, initial encounter: Secondary | ICD-10-CM | POA: Insufficient documentation
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
## 2018-04-27 NOTE — Assessment & Plan Note (Signed)
Patient did have loss of consciousness.  Patient will continue with the vitamin supplementation that has already been started.  Patient seems to be doing very well though with high functioning.  Balance seems to be doing really well as well.  Likely will resolve quickly in the next several weeks.  No imaging necessary

## 2018-04-27 NOTE — Assessment & Plan Note (Signed)
Patient does have what appears to be more of acromioclavicular swelling and capsular distention.  Possibly small hemarthrosis still noted.  Discussed icing regimen and home exercises. Patient will follow-up again in 2 to 3 weeks and will reevaluate.
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
## 2018-04-27 NOTE — Telephone Encounter (Signed)
Spoke with patient per appointment with Dr. Tamala Julian. On schedule this morning.

## 2018-04-27 NOTE — Patient Instructions (Signed)
Good to see you  We will gete yo back slowly For the hip lets do heat 10 minutes 2-3 times a day  Arnica lotion 2 times a day to there and the shoulder For the shoulder keep hands within peripheral vision  Continue all the vitamins we suggested.  Add 500mg  of vitamin C to your iron and see if that helps nighttime headaches  See me again in 10-14 days

## 2018-05-04 ENCOUNTER — Ambulatory Visit
Admission: RE | Admit: 2018-05-04 | Discharge: 2018-05-04 | Disposition: A | Discharge status: Home / Self Care | Payer: Medicare Other | Source: Ambulatory Visit | Attending: Physician Assistant | Admitting: Physician Assistant

## 2018-05-04 ENCOUNTER — Other Ambulatory Visit: Payer: Self-pay | Admitting: Physician Assistant

## 2018-05-04 DIAGNOSIS — J939 Pneumothorax, unspecified: Secondary | ICD-10-CM | POA: Diagnosis not present

## 2018-05-04 DIAGNOSIS — J9383 Other pneumothorax: Secondary | ICD-10-CM

## 2018-05-05 DIAGNOSIS — M25612 Stiffness of left shoulder, not elsewhere classified: Secondary | ICD-10-CM | POA: Diagnosis not present

## 2018-05-05 DIAGNOSIS — J939 Pneumothorax, unspecified: Secondary | ICD-10-CM | POA: Diagnosis not present

## 2018-05-05 DIAGNOSIS — S2242XA Multiple fractures of ribs, left side, initial encounter for closed fracture: Secondary | ICD-10-CM | POA: Diagnosis not present
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
## 2018-05-07 ENCOUNTER — Ambulatory Visit: Payer: Self-pay

## 2018-05-07 ENCOUNTER — Ambulatory Visit (INDEPENDENT_AMBULATORY_CARE_PROVIDER_SITE_OTHER): Payer: Medicare Other | Admitting: Family Medicine

## 2018-05-07 VITALS — BP 110/78 | HR 79 | Ht 70.0 in | Wt 158.0 lb

## 2018-05-07 DIAGNOSIS — G8929 Other chronic pain: Secondary | ICD-10-CM

## 2018-05-07 DIAGNOSIS — M25512 Pain in left shoulder: Principal | ICD-10-CM

## 2018-05-07 DIAGNOSIS — M7502 Adhesive capsulitis of left shoulder: Secondary | ICD-10-CM | POA: Diagnosis not present

## 2018-05-07 DIAGNOSIS — S7002XD Contusion of left hip, subsequent encounter: Secondary | ICD-10-CM | POA: Diagnosis not present

## 2018-05-07 NOTE — Patient Instructions (Addendum)
Good to see you  Ice is your friend  Drained the shoulder  Drained the hematoma  Lets see how the shoulder does  Hematoma maybe some compression for a short time, likely daily for 3 days.  Lets watch the knee but it looks good.  Write me Monday tell me how you are doing .  Another appointment in 3-4 weeks

## 2018-05-08 DIAGNOSIS — M7502 Adhesive capsulitis of left shoulder: Secondary | ICD-10-CM | POA: Insufficient documentation

## 2018-05-08 NOTE — Assessment & Plan Note (Signed)
Patient had aspiration done today.  Discussed icing regimen and home exercises.  Discussed which activities to doing which wants to avoid.  Patient is to try compression for some time.  He may need a re-aspiration if he continues.  Follow-up again in 4 weeks

## 2018-05-08 NOTE — Assessment & Plan Note (Signed)
Injected.  Discussed icing regimen and home exercises.  Discussed which activities of doing which wants to avoid.  Discussed avoiding any overhead motions for couple days.  Icing regimen.  Follow-up again in 4 to 6 weeks

## 2018-05-13 DIAGNOSIS — M6281 Muscle weakness (generalized): Secondary | ICD-10-CM | POA: Diagnosis not present

## 2018-05-13 DIAGNOSIS — M25512 Pain in left shoulder: Secondary | ICD-10-CM | POA: Diagnosis not present

## 2018-05-13 DIAGNOSIS — M25612 Stiffness of left shoulder, not elsewhere classified: Secondary | ICD-10-CM | POA: Diagnosis not present

## 2018-05-25 DIAGNOSIS — M6281 Muscle weakness (generalized): Secondary | ICD-10-CM | POA: Diagnosis not present

## 2018-05-25 DIAGNOSIS — M25612 Stiffness of left shoulder, not elsewhere classified: Secondary | ICD-10-CM | POA: Diagnosis not present

## 2018-05-25 DIAGNOSIS — M25512 Pain in left shoulder: Secondary | ICD-10-CM | POA: Diagnosis not present

## 2018-05-28 DIAGNOSIS — M25512 Pain in left shoulder: Secondary | ICD-10-CM | POA: Diagnosis not present

## 2018-05-28 DIAGNOSIS — M25612 Stiffness of left shoulder, not elsewhere classified: Secondary | ICD-10-CM | POA: Diagnosis not present

## 2018-05-28 DIAGNOSIS — M6281 Muscle weakness (generalized): Secondary | ICD-10-CM | POA: Diagnosis not present

## 2018-06-01 DIAGNOSIS — S50311A Abrasion of right elbow, initial encounter: Secondary | ICD-10-CM | POA: Diagnosis not present

## 2018-06-01 DIAGNOSIS — M25612 Stiffness of left shoulder, not elsewhere classified: Secondary | ICD-10-CM | POA: Diagnosis not present

## 2018-06-01 DIAGNOSIS — M6281 Muscle weakness (generalized): Secondary | ICD-10-CM | POA: Diagnosis not present

## 2018-06-01 DIAGNOSIS — M25512 Pain in left shoulder: Secondary | ICD-10-CM | POA: Diagnosis not present

## 2018-06-03 ENCOUNTER — Ambulatory Visit (INDEPENDENT_AMBULATORY_CARE_PROVIDER_SITE_OTHER): Payer: Medicare Other | Admitting: Family Medicine

## 2018-06-03 ENCOUNTER — Other Ambulatory Visit (INDEPENDENT_AMBULATORY_CARE_PROVIDER_SITE_OTHER): Payer: Medicare Other

## 2018-06-03 ENCOUNTER — Ambulatory Visit: Payer: Self-pay

## 2018-06-03 VITALS — BP 120/78 | HR 74 | Ht 70.0 in | Wt 151.0 lb

## 2018-06-03 DIAGNOSIS — M25512 Pain in left shoulder: Secondary | ICD-10-CM | POA: Diagnosis not present

## 2018-06-03 DIAGNOSIS — T733XXA Exhaustion due to excessive exertion, initial encounter: Secondary | ICD-10-CM | POA: Diagnosis not present

## 2018-06-03 DIAGNOSIS — M2550 Pain in unspecified joint: Secondary | ICD-10-CM

## 2018-06-03 DIAGNOSIS — G8929 Other chronic pain: Secondary | ICD-10-CM

## 2018-06-03 DIAGNOSIS — S7002XD Contusion of left hip, subsequent encounter: Secondary | ICD-10-CM | POA: Diagnosis not present

## 2018-06-03 LAB — SEDIMENTATION RATE: Sed Rate: 7 mm/hr (ref 0–20)

## 2018-06-03 LAB — FERRITIN: Ferritin: 187.2 ng/mL (ref 22.0–322.0)

## 2018-06-03 NOTE — Patient Instructions (Signed)
Good to see you  2 labs today  The shoulder looks good and keep trucking along.  Ice still after activity  Ok to bike outside starting in august  Consider running 3 mniutes then walk one minute  Then next week 4 min walk 1 minute Every week increase by a minute.  Make sure eat plenty of protein afterward See me agai in 4 weeks for one more tune up

## 2018-06-03 NOTE — Assessment & Plan Note (Signed)
Separation and responded fairly well to the injection.  Continue with physical therapy.  Continue to increase in range of motion.  Patient will be seen again in 1 month and may need to consider repeat injection before patient's competition.

## 2018-06-03 NOTE — Assessment & Plan Note (Signed)
Resolved with no accumulation.  Doing very well.  Follow-up again in 4 to 6 weeks

## 2018-06-03 NOTE — Assessment & Plan Note (Signed)
Patient getting some increasing fatigue secondary to either overtraining of the possibility of inflammation.  Sedimentation rate and ferritin given today to further evaluate.
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
## 2018-06-04 DIAGNOSIS — M25512 Pain in left shoulder: Secondary | ICD-10-CM | POA: Diagnosis not present

## 2018-06-04 DIAGNOSIS — M6281 Muscle weakness (generalized): Secondary | ICD-10-CM | POA: Diagnosis not present

## 2018-06-04 DIAGNOSIS — M25612 Stiffness of left shoulder, not elsewhere classified: Secondary | ICD-10-CM | POA: Diagnosis not present

## 2018-06-09 DIAGNOSIS — M25612 Stiffness of left shoulder, not elsewhere classified: Secondary | ICD-10-CM | POA: Diagnosis not present

## 2018-06-09 DIAGNOSIS — M6281 Muscle weakness (generalized): Secondary | ICD-10-CM | POA: Diagnosis not present

## 2018-06-09 DIAGNOSIS — M25512 Pain in left shoulder: Secondary | ICD-10-CM | POA: Diagnosis not present

## 2018-06-10 NOTE — Progress Notes (Signed)
[name] [address] [place], [place] [address] Phone: [phone] Subjective:    CC: Left calf  [id number]  Paul [name] is a 67 y.o. male coming in with complaint of left calf pain. Calf is sensitive to touch. Believes it was when he fell a few weeks ago. TTP. No radiating pain or numbness and tingling.   Onset- A few weeks ago Location- Calf Character- sharp Aggravating factors- TTP mild tightness Severity-6 out of 10     Past Medical History:  Diagnosis Date  . Depression    RESOLVED  . Hyperlipidemia   . Spinal stenosis    WAS HAVING NECK PAIN -PT STATES NERVE ABLATION PROCEDURE 2012 AND PAIN HAS RESOLVED  . Spondylitis [facility])    Past Surgical History:  Procedure Laterality Date  . APPENDECTOMY  1962  . HERNIA REPAIR    . INSERTION OF MESH  10/06/2012   Procedure: INSERTION OF MESH;  Surgeon: Imogene Burn. Georgette Dover, MD;  Location: [facility];  Service: General;  Laterality: N/A;  . [place]   left  . TONSILLECTOMY  1968  . UMBILICAL HERNIA REPAIR  10/06/2012   Procedure: HERNIA REPAIR UMBILICAL ADULT;  Surgeon: Imogene Burn. Georgette Dover, MD;  Location: [facility];  Service: General;  Laterality: N/A;   Social History   Socioeconomic History  . Marital status: Married    Spouse name: Not on file  . Number of children: Not on file  . Years of education: Not on file  . Highest education level: Not on file  Occupational History  . Not on file  Social Needs  . Financial resource strain: Not on file  . Food insecurity:    Worry: Not on file    Inability: Not on file  . Transportation needs:    Medical: Not on file    Non-medical: Not on file  Tobacco Use  . Smoking status: Never Smoker  Substance and Sexual Activity  . Alcohol use: Yes    Comment: OCCAS ALCOHOL  . Drug use: No  . Sexual activity: Not on file  Lifestyle  . Physical activity:    Days per week: Not on file    Minutes per session: Not on file  .  Stress: Not on file  Relationships  . Social connections:    Talks on phone: Not on file    Gets together: Not on file    Attends religious service: Not on file    Active member of club or organization: Not on file    Attends meetings of clubs or organizations: Not on file    Relationship status: Not on file  Other Topics Concern  . Not on file  Social History Narrative   ** Merged History Encounter **       Allergies  Allergen Reactions  . Crestor [Rosuvastatin]     MUSCLE CRAMPS  . Lipitor [Atorvastatin]     Liver toxicity   Family History  Problem Relation Age of Onset  . Stroke Mother   . Heart disease Father   . Cancer Sister        bone     Past medical history, social, surgical and family history all reviewed in electronic medical record.  No pertanent information unless stated regarding to the chief complaint.   Review of Systems:Review of systems updated and as accurate as of 06/11/18  No headache, visual changes, nausea, vomiting, diarrhea, constipation, dizziness, abdominal pain, skin rash, fevers,  chills, night sweats, weight loss, swollen lymph nodes, body aches, joint swelling,  chest pain, shortness of breath, mood changes.  Positive muscle aches  Objective  Blood pressure 118/74, pulse 66, height 5\' 10"  (1.778 m), weight 160 lb (72.6 kg), SpO2 95 %. Systems examined below as of 06/11/18   General: No apparent distress alert and oriented x3 mood and affect normal, dressed appropriately.  HEENT: Pupils equal, extraocular movements intact  Respiratory: Patient's speak in full sentences and does not appear short of breath  Cardiovascular: No lower extremity edema, non tender, no erythema  Skin: Warm dry intact with no signs of infection or rash on extremities or on axial skeleton.  Abdomen: Soft nontender  Neuro: Cranial nerves II through XII are intact, neurovascularly intact in all extremities with 2+ DTRs and 2+ pulses.  Lymph: No lymphadenopathy of  posterior or anterior cervical chain or axillae bilaterally.  Gait normal with good balance and coordination.  MSK:  Non tender with full range of motion and good stability and symmetric strength and tone of  elbows, wrist, hip, knee and ankles bilaterally.  Arthritic changes of multiple joints and patient is still working on the injury to the shoulder  Left calf moderately tender to palpation right at the insertion of the Achilles and the gastrocnemius.  No defect palpated.  Full range of motion.  Neurovascular intact distally.  Full strength.  Limited musculoskeletal ultrasound was performed and interpreted by [name]  Limited ultrasound of patient's left calf shows that there is a strain with increasing Doppler flow noted.  No muscle defect though noted.  Patient does have some enlargement of some of the blood vessels but completely compressible.  Nontender during the exam. Impression: Calf strain      Impression and Recommendations:     This case required medical decision making of moderate complexity.      Note: This dictation was prepared with Dragon dictation along with smaller phrase technology. Any transcriptional errors that result from this process are unintentional.

## 2018-06-11 ENCOUNTER — Encounter: Payer: Self-pay | Admitting: Family Medicine

## 2018-06-11 ENCOUNTER — Ambulatory Visit (INDEPENDENT_AMBULATORY_CARE_PROVIDER_SITE_OTHER): Payer: Medicare Other | Admitting: Family Medicine

## 2018-06-11 DIAGNOSIS — M79662 Pain in left lower leg: Secondary | ICD-10-CM

## 2018-06-11 MED ORDER — NITROGLYCERIN 0.2 MG/HR TD PT24: MEDICATED_PATCH | TRANSDERMAL | 1 refills | Status: DC

## 2018-06-11 NOTE — Assessment & Plan Note (Signed)
Small strain noted.  Started nitroglycerin again, discussed compression, heel lift with regular shoes, home exercises encouraged.  Follow-up again in 3 to 4 weeks.  Following up for his other injuries soon as well.

## 2018-06-11 NOTE — Patient Instructions (Signed)
Good to see you  Calf compression again would be good  Also heel lift in regular walking shoes.  Nitroglycerin Protocol   Apply 1/4 nitroglycerin patch to affected area daily.  Change position of patch within the affected area every 24 hours.  You may experience a headache during the first 1-2 weeks of using the patch, these should subside.  If you experience headaches after beginning nitroglycerin patch treatment, you may take your preferred over the counter pain reliever.  Another side effect of the nitroglycerin patch is skin irritation or rash related to patch adhesive.  Please notify our office if you develop more severe headaches or rash, and stop the patch.  Tendon healing with nitroglycerin patch may require 12 to 24 weeks depending on the extent of injury.  Men should not use if taking Viagra, Cialis, or Levitra.   Do not use if you have migraines or rosacea.   Keep pushing it- tme will be close but I am optimistic.  See you soon

## 2018-06-12 DIAGNOSIS — M6281 Muscle weakness (generalized): Secondary | ICD-10-CM | POA: Diagnosis not present

## 2018-06-12 DIAGNOSIS — M25512 Pain in left shoulder: Secondary | ICD-10-CM | POA: Diagnosis not present

## 2018-06-12 DIAGNOSIS — M25612 Stiffness of left shoulder, not elsewhere classified: Secondary | ICD-10-CM | POA: Diagnosis not present

## 2018-06-15 DIAGNOSIS — M25612 Stiffness of left shoulder, not elsewhere classified: Secondary | ICD-10-CM | POA: Diagnosis not present

## 2018-06-15 DIAGNOSIS — M6281 Muscle weakness (generalized): Secondary | ICD-10-CM | POA: Diagnosis not present

## 2018-06-15 DIAGNOSIS — M25512 Pain in left shoulder: Secondary | ICD-10-CM | POA: Diagnosis not present

## 2018-06-19 DIAGNOSIS — M25512 Pain in left shoulder: Secondary | ICD-10-CM | POA: Diagnosis not present

## 2018-06-19 DIAGNOSIS — M6281 Muscle weakness (generalized): Secondary | ICD-10-CM | POA: Diagnosis not present

## 2018-06-19 DIAGNOSIS — M25612 Stiffness of left shoulder, not elsewhere classified: Secondary | ICD-10-CM | POA: Diagnosis not present

## 2018-06-23 DIAGNOSIS — M25612 Stiffness of left shoulder, not elsewhere classified: Secondary | ICD-10-CM | POA: Diagnosis not present

## 2018-06-23 DIAGNOSIS — M25512 Pain in left shoulder: Secondary | ICD-10-CM | POA: Diagnosis not present

## 2018-06-23 DIAGNOSIS — M6281 Muscle weakness (generalized): Secondary | ICD-10-CM | POA: Diagnosis not present

## 2018-06-26 DIAGNOSIS — M25512 Pain in left shoulder: Secondary | ICD-10-CM | POA: Diagnosis not present

## 2018-06-26 DIAGNOSIS — M6281 Muscle weakness (generalized): Secondary | ICD-10-CM | POA: Diagnosis not present

## 2018-06-26 DIAGNOSIS — M25612 Stiffness of left shoulder, not elsewhere classified: Secondary | ICD-10-CM | POA: Diagnosis not present

## 2018-06-26 NOTE — Progress Notes (Signed)
[name] [place], [place] [address] Phone: [phone] Subjective:      CC: Left shoulder pain follow-up  BJY:[id number]  Paul [name] is a 67 y.o. male coming in with complaint of calf pain. He has been doing better. His pain has resolved with nitro. Pain still occurs in his left shoulder. Is doing physical therapy and notes pain with flexion. Swam on Saturday and notices that his shoulder was catching. Has not been able to go as intense as he use to be able to.  Shoulder pain is still keeping him from working out on a regular basis at this moment.  Patient's left calf has completely resolved.  Continues to have significant fatigue though.     Past Medical History:  Diagnosis Date  . Depression    RESOLVED  . Hyperlipidemia   . Spinal stenosis    WAS HAVING NECK PAIN -PT STATES NERVE ABLATION PROCEDURE 2012 AND PAIN HAS RESOLVED  . Spondylitis [facility])    Past Surgical History:  Procedure Laterality Date  . APPENDECTOMY  1962  . HERNIA REPAIR    . INSERTION OF MESH  10/06/2012   Procedure: INSERTION OF MESH;  Surgeon: Imogene Burn. Georgette Dover, MD;  Location: [facility];  Service: General;  Laterality: N/A;  . [place]   left  . TONSILLECTOMY  1968  . UMBILICAL HERNIA REPAIR  10/06/2012   Procedure: HERNIA REPAIR UMBILICAL ADULT;  Surgeon: Imogene Burn. Georgette Dover, MD;  Location: [facility];  Service: General;  Laterality: N/A;   Social History   Socioeconomic History  . Marital status: Married    Spouse name: Not on file  . Number of children: Not on file  . Years of education: Not on file  . Highest education level: Not on file  Occupational History  . Not on file  Social Needs  . Financial resource strain: Not on file  . Food insecurity:    Worry: Not on file    Inability: Not on file  . Transportation needs:    Medical: Not on file    Non-medical: Not on file  Tobacco Use  . Smoking status: Never Smoker  Substance and  Sexual Activity  . Alcohol use: Yes    Comment: OCCAS ALCOHOL  . Drug use: No  . Sexual activity: Not on file  Lifestyle  . Physical activity:    Days per week: Not on file    Minutes per session: Not on file  . Stress: Not on file  Relationships  . Social connections:    Talks on phone: Not on file    Gets together: Not on file    Attends religious service: Not on file    Active member of club or organization: Not on file    Attends meetings of clubs or organizations: Not on file    Relationship status: Not on file  Other Topics Concern  . Not on file  Social History Narrative   ** Merged History Encounter **       Allergies  Allergen Reactions  . Crestor [Rosuvastatin]     MUSCLE CRAMPS  . Lipitor [Atorvastatin]     Liver toxicity   Family History  Problem Relation Age of Onset  . Stroke Mother   . Heart disease Father   . Cancer Sister        bone     Past medical history, social, surgical and family history all reviewed in  electronic medical record.  No pertanent information unless stated regarding to the chief complaint.   Review of Systems:Review of systems updated and as accurate as of 06/29/18  No headache, visual changes, nausea, vomiting, diarrhea, constipation, dizziness, abdominal pain, skin rash, fevers, chills, night sweats, weight loss, swollen lymph nodes, body aches, joint swelling, , chest pain, shortness of breath, mood changes.  Positive muscle soreness  Objective  Blood pressure 118/72, pulse 100, height 5\' 10"  (1.778 m), weight 158 lb (71.7 kg), SpO2 97 %. Systems examined below as of 06/29/18   General: No apparent distress alert and oriented x3 mood and affect normal, dressed appropriately.  HEENT: Pupils equal, extraocular movements intact  Respiratory: Patient's speak in full sentences and does not appear short of breath  Cardiovascular: No lower extremity edema, non tender, no erythema  Skin: Warm dry intact with no signs of infection or  rash on extremities or on axial skeleton.  Abdomen: Soft nontender  Neuro: Cranial nerves II through XII are intact, neurovascularly intact in all extremities with 2+ DTRs and 2+ pulses.  Lymph: No lymphadenopathy of posterior or anterior cervical chain or axillae bilaterally.  Gait normal with good balance and coordination.  MSK:  Non tender with full range of motion and good stability and symmetric strength and tone of , elbows, wrist, hip, knee and ankles bilaterally.  Mild arthritic changes of multiple joints  Left shoulder exam still shows some mild impingement.  Lacks last 5 degrees of forward flexion.  Patient does have some limited external rotation still noted.  4+ out of 5 strength compared to the contralateral side.    Impression and Recommendations:     This case required medical decision making of moderate complexity.      Note: This dictation was prepared with Dragon dictation along with smaller phrase technology. Any transcriptional errors that result from this process are unintentional.

## 2018-06-29 ENCOUNTER — Ambulatory Visit (INDEPENDENT_AMBULATORY_CARE_PROVIDER_SITE_OTHER): Payer: Medicare Other | Admitting: Family Medicine

## 2018-06-29 ENCOUNTER — Ambulatory Visit: Payer: Self-pay

## 2018-06-29 ENCOUNTER — Encounter: Payer: Self-pay | Admitting: Family Medicine

## 2018-06-29 VITALS — BP 118/72 | HR 100 | Ht 70.0 in | Wt 158.0 lb

## 2018-06-29 DIAGNOSIS — M25512 Pain in left shoulder: Principal | ICD-10-CM

## 2018-06-29 DIAGNOSIS — M6281 Muscle weakness (generalized): Secondary | ICD-10-CM | POA: Diagnosis not present

## 2018-06-29 DIAGNOSIS — G8929 Other chronic pain: Secondary | ICD-10-CM | POA: Diagnosis not present

## 2018-06-29 DIAGNOSIS — T733XXD Exhaustion due to excessive exertion, subsequent encounter: Secondary | ICD-10-CM | POA: Diagnosis not present

## 2018-06-29 DIAGNOSIS — M7502 Adhesive capsulitis of left shoulder: Secondary | ICD-10-CM

## 2018-06-29 DIAGNOSIS — M25612 Stiffness of left shoulder, not elsewhere classified: Secondary | ICD-10-CM | POA: Diagnosis not present

## 2018-06-29 NOTE — Assessment & Plan Note (Addendum)
Patient continues to have significant fatigue.  I believe the patient is having difficulty with test secondary to hitting a wall on a regular basis.  We discussed with patient about that deconditioning any hand.  Other supplementations and over-the-counter medications that I think will be beneficial.  Patient will try to increase activity over the course the next several days again.  Follow-up again in 4 weeks Spent  25 minutes with patient face-to-face and had greater than 50% of counseling including as described above in assessment and plan.

## 2018-06-29 NOTE — Assessment & Plan Note (Signed)
Making progress.  Patient though is going to race in 3 weeks.  We will have patient come back before that and consider a possible injection again if necessary.  Patient will continue to try to increase activity.

## 2018-06-29 NOTE — Patient Instructions (Signed)
Good to see you  Paul [name] is your friend.  Stay active.  I am hoping it will come with time.  Consider a pre-workout for a short-term to get a little kick in the backside. C4 is a good brand.  Branch chain amino acids could be good. Take this with your post workout intake.  Arginine any dose daily as well  See me again in 2-3 weeks and we will inject the shoulder if needed.

## 2018-07-03 DIAGNOSIS — M25512 Pain in left shoulder: Secondary | ICD-10-CM | POA: Diagnosis not present

## 2018-07-03 DIAGNOSIS — M6281 Muscle weakness (generalized): Secondary | ICD-10-CM | POA: Diagnosis not present

## 2018-07-03 DIAGNOSIS — M25612 Stiffness of left shoulder, not elsewhere classified: Secondary | ICD-10-CM | POA: Diagnosis not present

## 2018-07-06 DIAGNOSIS — M9903 Segmental and somatic dysfunction of lumbar region: Secondary | ICD-10-CM | POA: Diagnosis not present

## 2018-07-06 DIAGNOSIS — M25612 Stiffness of left shoulder, not elsewhere classified: Secondary | ICD-10-CM | POA: Diagnosis not present

## 2018-07-06 DIAGNOSIS — S138XXA Sprain of joints and ligaments of other parts of neck, initial encounter: Secondary | ICD-10-CM | POA: Diagnosis not present

## 2018-07-06 DIAGNOSIS — M9902 Segmental and somatic dysfunction of thoracic region: Secondary | ICD-10-CM | POA: Diagnosis not present

## 2018-07-06 DIAGNOSIS — M9901 Segmental and somatic dysfunction of cervical region: Secondary | ICD-10-CM | POA: Diagnosis not present

## 2018-07-06 DIAGNOSIS — S29012A Strain of muscle and tendon of back wall of thorax, initial encounter: Secondary | ICD-10-CM | POA: Diagnosis not present

## 2018-07-06 DIAGNOSIS — S39012A Strain of muscle, fascia and tendon of lower back, initial encounter: Secondary | ICD-10-CM | POA: Diagnosis not present

## 2018-07-06 DIAGNOSIS — M25512 Pain in left shoulder: Secondary | ICD-10-CM | POA: Diagnosis not present

## 2018-07-06 DIAGNOSIS — M6281 Muscle weakness (generalized): Secondary | ICD-10-CM | POA: Diagnosis not present

## 2018-07-10 DIAGNOSIS — S138XXA Sprain of joints and ligaments of other parts of neck, initial encounter: Secondary | ICD-10-CM | POA: Diagnosis not present

## 2018-07-10 DIAGNOSIS — S39012A Strain of muscle, fascia and tendon of lower back, initial encounter: Secondary | ICD-10-CM | POA: Diagnosis not present

## 2018-07-10 DIAGNOSIS — M9902 Segmental and somatic dysfunction of thoracic region: Secondary | ICD-10-CM | POA: Diagnosis not present

## 2018-07-10 DIAGNOSIS — M9903 Segmental and somatic dysfunction of lumbar region: Secondary | ICD-10-CM | POA: Diagnosis not present

## 2018-07-10 DIAGNOSIS — S29012A Strain of muscle and tendon of back wall of thorax, initial encounter: Secondary | ICD-10-CM | POA: Diagnosis not present

## 2018-07-10 DIAGNOSIS — M9901 Segmental and somatic dysfunction of cervical region: Secondary | ICD-10-CM | POA: Diagnosis not present

## 2018-07-13 DIAGNOSIS — M6281 Muscle weakness (generalized): Secondary | ICD-10-CM | POA: Diagnosis not present

## 2018-07-13 DIAGNOSIS — M25512 Pain in left shoulder: Secondary | ICD-10-CM | POA: Diagnosis not present

## 2018-07-13 DIAGNOSIS — M25612 Stiffness of left shoulder, not elsewhere classified: Secondary | ICD-10-CM | POA: Diagnosis not present

## 2018-07-14 DIAGNOSIS — M9903 Segmental and somatic dysfunction of lumbar region: Secondary | ICD-10-CM | POA: Diagnosis not present

## 2018-07-14 DIAGNOSIS — S138XXA Sprain of joints and ligaments of other parts of neck, initial encounter: Secondary | ICD-10-CM | POA: Diagnosis not present

## 2018-07-14 DIAGNOSIS — M9901 Segmental and somatic dysfunction of cervical region: Secondary | ICD-10-CM | POA: Diagnosis not present

## 2018-07-14 DIAGNOSIS — S39012A Strain of muscle, fascia and tendon of lower back, initial encounter: Secondary | ICD-10-CM | POA: Diagnosis not present

## 2018-07-14 DIAGNOSIS — M9902 Segmental and somatic dysfunction of thoracic region: Secondary | ICD-10-CM | POA: Diagnosis not present

## 2018-07-14 DIAGNOSIS — S29012A Strain of muscle and tendon of back wall of thorax, initial encounter: Secondary | ICD-10-CM | POA: Diagnosis not present

## 2018-07-16 DIAGNOSIS — M9903 Segmental and somatic dysfunction of lumbar region: Secondary | ICD-10-CM | POA: Diagnosis not present

## 2018-07-16 DIAGNOSIS — S39012A Strain of muscle, fascia and tendon of lower back, initial encounter: Secondary | ICD-10-CM | POA: Diagnosis not present

## 2018-07-16 DIAGNOSIS — S29012A Strain of muscle and tendon of back wall of thorax, initial encounter: Secondary | ICD-10-CM | POA: Diagnosis not present

## 2018-07-16 DIAGNOSIS — M9902 Segmental and somatic dysfunction of thoracic region: Secondary | ICD-10-CM | POA: Diagnosis not present

## 2018-07-16 DIAGNOSIS — M9901 Segmental and somatic dysfunction of cervical region: Secondary | ICD-10-CM | POA: Diagnosis not present

## 2018-07-16 DIAGNOSIS — S138XXA Sprain of joints and ligaments of other parts of neck, initial encounter: Secondary | ICD-10-CM | POA: Diagnosis not present

## 2018-07-17 ENCOUNTER — Ambulatory Visit (INDEPENDENT_AMBULATORY_CARE_PROVIDER_SITE_OTHER): Payer: Medicare Other | Admitting: Family Medicine

## 2018-07-17 ENCOUNTER — Ambulatory Visit: Payer: Self-pay

## 2018-07-17 ENCOUNTER — Other Ambulatory Visit: Payer: Medicare Other

## 2018-07-17 ENCOUNTER — Encounter: Payer: Self-pay | Admitting: Family Medicine

## 2018-07-17 VITALS — BP 138/88 | HR 71 | Ht 70.0 in

## 2018-07-17 DIAGNOSIS — M25562 Pain in left knee: Principal | ICD-10-CM

## 2018-07-17 DIAGNOSIS — G8929 Other chronic pain: Secondary | ICD-10-CM

## 2018-07-17 DIAGNOSIS — M25512 Pain in left shoulder: Secondary | ICD-10-CM | POA: Diagnosis not present

## 2018-07-17 DIAGNOSIS — M25612 Stiffness of left shoulder, not elsewhere classified: Secondary | ICD-10-CM | POA: Diagnosis not present

## 2018-07-17 DIAGNOSIS — M7502 Adhesive capsulitis of left shoulder: Secondary | ICD-10-CM

## 2018-07-17 DIAGNOSIS — M7122 Synovial cyst of popliteal space [Baker], left knee: Secondary | ICD-10-CM | POA: Insufficient documentation

## 2018-07-17 DIAGNOSIS — M6281 Muscle weakness (generalized): Secondary | ICD-10-CM | POA: Diagnosis not present

## 2018-07-17 MED ORDER — ALBUTEROL SULFATE HFA 108 (90 BASE) MCG/ACT IN AERS: 2.0000 | INHALATION_SPRAY | RESPIRATORY_TRACT | 6 refills | Status: DC | PRN

## 2018-07-17 MED ORDER — PREDNISONE 50 MG PO TABS: 50.0000 mg | ORAL_TABLET | Freq: Every day | ORAL | 0 refills | Status: DC

## 2018-07-17 NOTE — Assessment & Plan Note (Signed)
Repeat injection today.  Did not tolerate the procedure well.  Discussed icing still and home exercises.  Patient will follow-up after his race this time is how it is going.

## 2018-07-17 NOTE — Patient Instructions (Addendum)
Try inhaler 30 minutes before hand Injected the shoulder and drained the knee.  If groin is giving you pain start the prednisone 2 days before the race and then take it for 2 days after the race.  We will follow up on the pelvis after the race  Make an appointment when you get back

## 2018-07-17 NOTE — Progress Notes (Signed)
[name] [place], [place] [address] Phone: [phone] Subjective:   Paul [name], am serving as a scribe for Dr. Hulan Saas.   CC: Shoulder and knee pain  [id number]  Paul [name] is a 67 y.o. male coming in with complaint of shoulder pain. Continues to have pain in end ranges. Had a massage on Wednesday which caused him some relief but then pain. He continues to swim despite the pain.   Patient has been having left knee pain. He feels like his range of motion limited in flexion. Notices this when he is unclipping from his bike and trying to bend over to release his shoes.   With running he also has pain in the hip flexor and groin pain. Pain occurs for 2 minutes and then subsides. IF he stops running the pain will occur for 2 minutes and then go away. Only occurs with running.   Also notes that he has been having troubles running 9 minute miles as his diaphragm seizes up. He backed off the 9 minute time to 10 minutes and does not experience the spasming.       Past Medical History:  Diagnosis Date  . Depression    RESOLVED  . Hyperlipidemia   . Spinal stenosis    WAS HAVING NECK PAIN -PT STATES NERVE ABLATION PROCEDURE 2012 AND PAIN HAS RESOLVED  . Spondylitis [facility])    Past Surgical History:  Procedure Laterality Date  . APPENDECTOMY  1962  . HERNIA REPAIR    . INSERTION OF MESH  10/06/2012   Procedure: INSERTION OF MESH;  Surgeon: Imogene Burn. Georgette Dover, MD;  [facility]: [facility];  Service: General;  Laterality: N/A;  . [place]   left  . TONSILLECTOMY  1968  . UMBILICAL HERNIA REPAIR  10/06/2012   Procedure: HERNIA REPAIR UMBILICAL ADULT;  Surgeon: Imogene Burn. Georgette Dover, MD;  Location: [facility];  Service: General;  Laterality: N/A;   Social History   Socioeconomic History  . Marital status: Married    Spouse name: Not on file  . Number of children: Not on file  . Years of education: Not on file  .  Highest education level: Not on file  Occupational History  . Not on file  Social Needs  . Financial resource strain: Not on file  . Food insecurity:    Worry: Not on file    Inability: Not on file  . Transportation needs:    Medical: Not on file    Non-medical: Not on file  Tobacco Use  . Smoking status: Never Smoker  Substance and Sexual Activity  . Alcohol use: Yes    Comment: OCCAS ALCOHOL  . Drug use: [name]  . Sexual activity: Not on file  Lifestyle  . Physical activity:    Days per week: Not on file    Minutes per session: Not on file  . Stress: Not on file  Relationships  . Social connections:    Talks on phone: Not on file    Gets together: Not on file    Attends religious service: Not on file    Active member of club or organization: Not on file    Attends meetings of clubs or organizations: Not on file    Relationship status: Not on file  Other Topics Concern  . Not on file  Social History Narrative   ** Merged History Encounter **       Allergies  Allergen Reactions  . Crestor [Rosuvastatin]     MUSCLE CRAMPS  . Lipitor [Atorvastatin]     Liver toxicity   Family History  Problem Relation Age of Onset  . Stroke Mother   . Heart disease Father   . Cancer Sister        bone    Current Outpatient Medications (Endocrine & Metabolic):  .  predniSONE (DELTASONE) 50 MG tablet, Take 1 tablet (50 mg total) by mouth daily.  Current Outpatient Medications (Cardiovascular):  .  nitroGLYCERIN (NITRODUR - DOSED IN MG/24 HR) 0.2 mg/hr patch, 1/4 patch daily  Current Outpatient Medications (Respiratory):  .  albuterol (PROVENTIL HFA;VENTOLIN HFA) 108 (90 Base) MCG/ACT inhaler, Inhale 2 puffs into the lungs every 4 (four) hours as needed for wheezing or shortness of breath.  Current Outpatient Medications (Analgesics):  .  acetaminophen (TYLENOL) 500 MG tablet, Take 2 tablets (1,000 mg total) by mouth every 8 (eight) hours as needed for mild pain. [name] Kitchen  ibuprofen  (ADVIL,MOTRIN) 800 MG tablet, Take 1 tablet (800 mg total) by mouth every 8 (eight) hours as needed for moderate pain.  Current Outpatient Medications (Hematological):  [name] Kitchen  Cyanocobalamin (VITAMIN B 12 PO), Take 1 tablet by mouth daily. .  vitamin B-12 (CYANOCOBALAMIN) 1000 MCG tablet, Take 1,000 mcg by mouth daily.  Current Outpatient Medications (Other):  [name] Kitchen  Calcium Carb-Cholecalciferol (LIQUID CALCIUM WITH D3) [phone] MG-UNIT CAPS, Take 150 mcg by mouth. .  calcium-vitamin D (OSCAL WITH D) 500-200 MG-UNIT tablet, Take 1 tablet by mouth daily with breakfast. .  fish oil-omega-3 fatty acids 1000 MG capsule, Take 4 g by mouth daily.  .  Niacin CR 750 MG TBCR, Take 2 tablets by mouth every morning. .  vitamin A 10000 UNIT capsule, Take 3,000 Units by mouth daily.    Past medical history, social, surgical and family history all reviewed in electronic medical record.  [name] pertanent information unless stated regarding to the chief complaint.   Review of Systems:  [name] headache, visual changes, nausea, vomiting, diarrhea, constipation, dizziness, abdominal pain, skin rash, fevers, chills, night sweats, weight loss, swollen lymph nodes, body aches, joint swelling, chest pain, shortness of breath, mood changes.  Positive muscle aches  Objective  Blood pressure 138/88, pulse 71, height 5\' 10"  (1.778 m), SpO2 98 %. Systems examined below as of    General: [name] apparent distress alert and oriented x3 mood and affect normal, dressed appropriately.  HEENT: Pupils equal, extraocular movements intact  Respiratory: Patient's speak in full sentences and does not appear short of breath  Cardiovascular: [name] lower extremity edema, non tender, [name] erythema  Skin: Warm dry intact with [name] signs of infection or rash on extremities or on axial skeleton.  Abdomen: Soft nontender  Neuro: Cranial nerves II through XII are intact, neurovascularly intact in all extremities with 2+ DTRs and 2+ pulses.  Lymph: [name]  lymphadenopathy of posterior or anterior cervical chain or axillae bilaterally.  Gait antalgic MSK:  Non tender with full range of motion and good stability and symmetric strength and tone of elbows, wrist, hip, and ankles bilaterally.  Left shoulder exam shows the patient does have some limited loss of range of motion in all planes 5 degrees of forward flexion, 5 degrees of external rotation and has internal rotation to L5.  Left knee exam shows excellent loss of last 10 degrees of flexion.  Mild tenderness over the medial and lateral joint space.  [name] significant instability are noted.   Procedure: Real-time Ultrasound  Guided Injection of left knee bigger cyst Device: GE Logiq Q7 Ultrasound guided injection is preferred based studies that show increased duration, increased effect, greater accuracy, decreased procedural pain, increased response rate, and decreased cost with ultrasound guided versus blind injection.  Verbal informed consent obtained.  Time-out conducted.  Noted [name] overlying erythema, induration, or other signs of local infection.  Skin prepped in a sterile fashion.  Local anesthesia: Topical Ethyl chloride.  With sterile technique and under real time ultrasound guidance: With a 22-gauge 2 inch needle patient was injected with 4 cc of 0.5% Marcaine and aspirated 35 cc of blood-tinged straw-colored fluid then injected 1 cc of Kenalog 40 mg/dL. This was from a posterior approach.  Completed without difficulty  Pain immediately resolved suggesting accurate placement of the medication.  Advised to call if fevers/chills, erythema, induration, drainage, or persistent bleeding.  Images permanently stored and available for review in the ultrasound unit.  Impression: Technically successful ultrasound guided injection.  Procedure: Real-time Ultrasound Guided Injection of left glenohumeral joint Device: GE Logiq E  Ultrasound guided injection is preferred based studies that show  increased duration, increased effect, greater accuracy, decreased procedural pain, increased response rate with ultrasound guided versus blind injection.  Verbal informed consent obtained.  Time-out conducted.  Noted [name] overlying erythema, induration, or other signs of local infection.  Skin prepped in a sterile fashion.  Local anesthesia: Topical Ethyl chloride.  With sterile technique and under real time ultrasound guidance:  Joint visualized.  21g 2 inch needle inserted posterior approach. Pictures taken for needle placement. Patient did have injection of 2 cc of 0.5% Marcaine, and 1cc of Kenalog 40 mg/dL. Completed without difficulty  Pain immediately resolved suggesting accurate placement of the medication.  Advised to call if fevers/chills, erythema, induration, drainage, or persistent bleeding.  Images permanently stored and available for review in the ultrasound unit.  Impression: Technically successful ultrasound guided injection.    Impression and Recommendations:     This case required medical decision making of moderate complexity. The above documentation has been reviewed and is accurate and complete [name], DO       Note: This dictation was prepared with Dragon dictation along with smaller phrase technology. Any transcriptional errors that result from this process are unintentional.

## 2018-07-17 NOTE — Assessment & Plan Note (Signed)
Aspiration today.  Discussed imbalance some more jerking we will send to the lab for further evaluation.  Compression, home exercise and icing regimen.  Some underlying arthritis.  Patient will follow-up with me after his race again.

## 2018-07-18 LAB — SYNOVIAL CELL COUNT + DIFF, W/ CRYSTALS
Basophils, %: 0 %
Eosinophils-Synovial: 0 % (ref 0–2)
LYMPHOCYTES-SYNOVIAL FLD: 35 % (ref 0–74)
MONOCYTE/MACROPHAGE: 44 % (ref 0–69)
Neutrophil, Synovial: 21 % (ref 0–24)
Synoviocytes, %: 0 % (ref 0–15)
WBC, SYNOVIAL: 219 {cells}/uL — AB (ref <150)
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
## 2018-08-04 ENCOUNTER — Encounter: Payer: Self-pay | Admitting: Family Medicine

## 2018-08-04 ENCOUNTER — Ambulatory Visit (INDEPENDENT_AMBULATORY_CARE_PROVIDER_SITE_OTHER)
Admission: RE | Admit: 2018-08-04 | Discharge: 2018-08-04 | Disposition: A | Discharge status: Home / Self Care | Payer: Medicare Other | Source: Ambulatory Visit | Attending: Family Medicine | Admitting: Family Medicine

## 2018-08-04 ENCOUNTER — Ambulatory Visit (INDEPENDENT_AMBULATORY_CARE_PROVIDER_SITE_OTHER): Payer: Medicare Other | Admitting: Family Medicine

## 2018-08-04 VITALS — BP 102/80 | HR 83 | Ht 70.0 in | Wt 154.0 lb

## 2018-08-04 DIAGNOSIS — M7502 Adhesive capsulitis of left shoulder: Secondary | ICD-10-CM

## 2018-08-04 DIAGNOSIS — G8929 Other chronic pain: Secondary | ICD-10-CM

## 2018-08-04 DIAGNOSIS — M25512 Pain in left shoulder: Secondary | ICD-10-CM

## 2018-08-04 NOTE — Patient Instructions (Signed)
Good to see you  Xray downstairs today  MR- arthrogram of the shoulder ordered as well  Keep doing the inhaler for now.  I will write you when I get the MRI and we will discuss what type of injection may need to be done or what is best to do next  Keep it up  Proud of you  We will talk or write soon

## 2018-08-04 NOTE — Assessment & Plan Note (Signed)
Patient did have more of adhesive bursitis of the left shoulder as well as acromioclavicular joint pain and likely had a underlying fracture that has healed.  Patient though unfortunately is having signs and symptoms that is concerning for a labral pathology.  Patient has done remarkably well overall but is seeming to plateau at this point.  Patient is having difficulty doing some of his daily activities and is exercising with swimming secondary to it and I feel that advanced imaging is warranted at this time.  Patient would be a candidate to have repair if needed.  Patient will continue all other conservative therapy until imaging is done and will discuss further afterwards.  Spent  25 minutes with patient face-to-face and had greater than 50% of counseling including as described above in assessment and plan.

## 2018-08-05 DIAGNOSIS — M9902 Segmental and somatic dysfunction of thoracic region: Secondary | ICD-10-CM | POA: Diagnosis not present

## 2018-08-05 DIAGNOSIS — S39012A Strain of muscle, fascia and tendon of lower back, initial encounter: Secondary | ICD-10-CM | POA: Diagnosis not present

## 2018-08-05 DIAGNOSIS — M47814 Spondylosis without myelopathy or radiculopathy, thoracic region: Secondary | ICD-10-CM | POA: Diagnosis not present

## 2018-08-05 DIAGNOSIS — M9903 Segmental and somatic dysfunction of lumbar region: Secondary | ICD-10-CM | POA: Diagnosis not present

## 2018-08-05 DIAGNOSIS — M47812 Spondylosis without myelopathy or radiculopathy, cervical region: Secondary | ICD-10-CM | POA: Diagnosis not present

## 2018-08-05 DIAGNOSIS — M9901 Segmental and somatic dysfunction of cervical region: Secondary | ICD-10-CM | POA: Diagnosis not present

## 2018-08-13 ENCOUNTER — Ambulatory Visit (INDEPENDENT_AMBULATORY_CARE_PROVIDER_SITE_OTHER): Payer: Medicare Other | Admitting: Family Medicine

## 2018-08-13 ENCOUNTER — Encounter: Payer: Self-pay | Admitting: Family Medicine

## 2018-08-13 ENCOUNTER — Ambulatory Visit: Payer: Self-pay

## 2018-08-13 VITALS — BP 118/84 | HR 55 | Ht 70.0 in

## 2018-08-13 DIAGNOSIS — M660 Rupture of popliteal cyst: Secondary | ICD-10-CM

## 2018-08-13 DIAGNOSIS — M79605 Pain in left leg: Secondary | ICD-10-CM

## 2018-08-13 MED ORDER — TRAMADOL HCL 50 MG PO TABS: 50.0000 mg | ORAL_TABLET | Freq: Three times a day (TID) | ORAL | 0 refills | Status: DC | PRN

## 2018-08-13 NOTE — Patient Instructions (Addendum)
Good to see you  Paul [name] is your friend Stay active but not til Monday  Tramadol up to 3 times a day after oxycodone if needed Compression  Can try working out Monday  Aspirin maybe a full 325mg  daily for next week  If worsening symptoms go to ER  See me again in 2-3 weeks

## 2018-08-13 NOTE — Progress Notes (Signed)
[name] [place], [place] [address] Phone: [phone] Subjective:   [name], am serving as a scribe for Dr. Hulan Saas.    CC: Left leg pain  [id number]  Paul [name] is a 67 y.o. male coming in with complaint of left leg pain. Was cycling on Tuesday and felt some pain in his calf. Had an off day yesterday and was back in the pool today. Had some pain in the calf again in the pool. Finished his workout and then went to get on the bike and he had excruciating pain in his calf. Tried to get off and walk and had to be wheeled out of the gym. Patient chooses to walk back to room but has to stop half way due to the pain increasing in his calf. Uses a wheel chair to get back to room remainder of the way.   Known history of Baker's cyst.  Denies any shortness of breath.     Past Medical History:  Diagnosis Date  . Depression    RESOLVED  . Hyperlipidemia   . Spinal stenosis    WAS HAVING NECK PAIN -PT STATES NERVE ABLATION PROCEDURE 2012 AND PAIN HAS RESOLVED  . Spondylitis [facility])    Past Surgical History:  Procedure Laterality Date  . APPENDECTOMY  1962  . HERNIA REPAIR    . INSERTION OF MESH  10/06/2012   Procedure: INSERTION OF MESH;  Surgeon: Imogene Burn. Georgette Dover, MD;  Location: [facility];  Service: General;  Laterality: N/A;  . [place]   left  . TONSILLECTOMY  1968  . UMBILICAL HERNIA REPAIR  10/06/2012   Procedure: HERNIA REPAIR UMBILICAL ADULT;  Surgeon: Imogene Burn. Georgette Dover, MD;  Location: [facility];  Service: General;  Laterality: N/A;   Social History   Socioeconomic History  . Marital status: Married    Spouse name: Not on file  . Number of children: Not on file  . Years of education: Not on file  . Highest education level: Not on file  Occupational History  . Not on file  Social Needs  . Financial resource strain: Not on file  . Food insecurity:    Worry: Not on file    Inability: Not on  file  . Transportation needs:    Medical: Not on file    Non-medical: Not on file  Tobacco Use  . Smoking status: Never Smoker  Substance and Sexual Activity  . Alcohol use: Yes    Comment: OCCAS ALCOHOL  . Drug use: No  . Sexual activity: Not on file  Lifestyle  . Physical activity:    Days per week: Not on file    Minutes per session: Not on file  . Stress: Not on file  Relationships  . Social connections:    Talks on phone: Not on file    Gets together: Not on file    Attends religious service: Not on file    Active member of club or organization: Not on file    Attends meetings of clubs or organizations: Not on file    Relationship status: Not on file  Other Topics Concern  . Not on file  Social History Narrative   ** Merged History Encounter **       Allergies  Allergen Reactions  . Crestor [Rosuvastatin]     MUSCLE CRAMPS  . Lipitor [Atorvastatin]     Liver toxicity   Family History  Problem Relation Age of Onset  . Stroke Mother   . Heart disease Father   . Cancer Sister        bone    Current Outpatient Medications (Endocrine & Metabolic):  .  predniSONE (DELTASONE) 50 MG tablet, Take 1 tablet (50 mg total) by mouth daily.  Current Outpatient Medications (Cardiovascular):  .  nitroGLYCERIN (NITRODUR - DOSED IN MG/24 HR) 0.2 mg/hr patch, 1/4 patch daily  Current Outpatient Medications (Respiratory):  .  albuterol (PROVENTIL HFA;VENTOLIN HFA) 108 (90 Base) MCG/ACT inhaler, Inhale 2 puffs into the lungs every 4 (four) hours as needed for wheezing or shortness of breath.  Current Outpatient Medications (Analgesics):  .  acetaminophen (TYLENOL) 500 MG tablet, Take 2 tablets (1,000 mg total) by mouth every 8 (eight) hours as needed for mild pain. [name] Kitchen  ibuprofen (ADVIL,MOTRIN) 800 MG tablet, Take 1 tablet (800 mg total) by mouth every 8 (eight) hours as needed for moderate pain. .  traMADol (ULTRAM) 50 MG tablet, Take 1 tablet (50 mg total) by mouth every 8  (eight) hours as needed.  Current Outpatient Medications (Hematological):  [name] Kitchen  Cyanocobalamin (VITAMIN B 12 PO), Take 1 tablet by mouth daily. .  vitamin B-12 (CYANOCOBALAMIN) 1000 MCG tablet, Take 1,000 mcg by mouth daily.  Current Outpatient Medications (Other):  [name] Kitchen  Calcium Carb-Cholecalciferol (LIQUID CALCIUM WITH D3) [phone] MG-UNIT CAPS, Take 150 mcg by mouth. .  calcium-vitamin D (OSCAL WITH D) 500-200 MG-UNIT tablet, Take 1 tablet by mouth daily with breakfast. .  fish oil-omega-3 fatty acids 1000 MG capsule, Take 4 g by mouth daily.  .  Niacin CR 750 MG TBCR, Take 2 tablets by mouth every morning. .  vitamin A 10000 UNIT capsule, Take 3,000 Units by mouth daily.    Past medical history, social, surgical and family history all reviewed in electronic medical record.  No pertanent information unless stated regarding to the chief complaint.   Review of Systems:  No headache, visual changes, nausea, vomiting, diarrhea, constipation, dizziness, abdominal pain, skin rash, fevers, chills, night sweats, weight loss, swollen lymph nodes, body aches, joint swelling, chest pain, shortness of breath, mood changes.  Positive muscle aches  Objective  Blood pressure 118/84, pulse (!) 55, height 5\' 10"  (1.778 m), SpO2 98 %.   General: No apparent distress alert and oriented x3 mood and affect normal, dressed appropriately.  HEENT: Pupils equal, extraocular movements intact  Respiratory: Patient's speak in full sentences and does not appear short of breath  Cardiovascular: 1+ lower extremity edema on the left side, non tender, no erythema  Skin: Warm dry intact with no signs of infection or rash on extremities or on axial skeleton.  Abdomen: Soft nontender  Neuro: Cranial nerves II through XII are intact, neurovascularly intact in all extremities with 2+ DTRs and 2+ pulses.  Lymph: No lymphadenopathy of posterior or anterior cervical chain or axillae bilaterally.  Gait unable to walk secondary  to pain MSK:  Non tender with full range of motion and good stability and symmetric strength and tone of , elbows, wrist, hip, knee and ankles bilaterally.  Mild arthritic changes of multiple joints.  Continues to have difficulty with his shoulder.  Left knee does have near full range of motion.  Tender to palpation of the calf.  Negative Thompson though.  Neurovascular intact distally.  Deep tendon reflexes as well as dorsalis pulses full and symmetric.  Limited musculoskeletal ultrasound was performed and interpreted by [name]  Limited ultrasound of patient's  left popliteal area does show that patient popliteal artery is compressible as well as the vein.  Patient does have a Baker's cyst noted that has ruptured and is approximately now 4 cm and down patient's medial calf. Impression: Ruptured Baker's cyst   Impression and Recommendations:     This case required medical decision making of moderate complexity. The above documentation has been reviewed and is accurate and complete [name], DO       Note: This dictation was prepared with Dragon dictation along with smaller phrase technology. Any transcriptional errors that result from this process are unintentional.

## 2018-08-13 NOTE — Assessment & Plan Note (Signed)
Patient has a ruptured Baker's cyst.  We discussed the differential diagnosis would include the possibility of exertional compartment syndrome as well as a deep venous thrombosis.  Patient declined a Doppler today.  I was able to fully compress the popliteal artery so I thought this would be fine.  Patient will increase the aspirin to 2 daily.  We also discussed with patient about compression, icing regimen.  We discussed the possibility of aspiration but usually after rupture of the Baker's cyst aspiration is more difficult.  We will hold and seeing patient again next week to make sure patient is responding well.  Patient is actually going to be following up with [place] anyhow after patient's MRI for his shoulder.

## 2018-08-17 ENCOUNTER — Ambulatory Visit
Admission: RE | Admit: 2018-08-17 | Discharge: 2018-08-17 | Disposition: A | Discharge status: Home / Self Care | Payer: Medicare Other | Source: Ambulatory Visit | Attending: Family Medicine | Admitting: Family Medicine

## 2018-08-17 DIAGNOSIS — M25512 Pain in left shoulder: Principal | ICD-10-CM

## 2018-08-17 DIAGNOSIS — G8929 Other chronic pain: Secondary | ICD-10-CM

## 2018-08-17 MED ORDER — IOPAMIDOL (ISOVUE-M 200) INJECTION 41%
12.0000 mL | Freq: Once | INTRAMUSCULAR | Status: AC
  Administered 2018-08-17: 12 mL via INTRA_ARTICULAR

## 2018-08-17 NOTE — Progress Notes (Signed)
[name] [place], [place] [address] Phone: [phone] Subjective:   [name], am serving as a scribe for Dr. Hulan Saas.   CC: Knee pain and shoulder pain follow-up  [id number]  Paul [name] is a 67 y.o. male coming in with complaint of left leg pain. He said that after he left here last visit he had 4 days of leg pain in which he couldn't walk. Has ran and swam since then but does have soreness in his leg. Pain in his leg from the Baker's cyst is posterior and anterior. He said that his leg swelled despite using a compression sock during his run yesterday. He had swelling the pushed out below the compression.  Was found to have a ruptured Baker's cyst.  Is here for his MRI results from his shoulder.  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  Independently visualized by me.  He also continues to have diaphragm pain in the mornings but uses the inhaler with running which diminishes that pain.        Past Medical History:  Diagnosis Date  . Depression    RESOLVED  . Hyperlipidemia   . Spinal stenosis    WAS HAVING NECK PAIN -PT STATES NERVE ABLATION PROCEDURE 2012 AND PAIN HAS RESOLVED  . Spondylitis [facility])    Past Surgical History:  Procedure Laterality Date  . APPENDECTOMY  1962  . HERNIA REPAIR    . INSERTION OF MESH  10/06/2012   Procedure: INSERTION OF MESH;  Surgeon: Imogene Burn. Georgette Dover, MD;  Location: [facility];  Service: General;  Laterality: N/A;  . [place]   left  . TONSILLECTOMY  1968  . UMBILICAL HERNIA REPAIR  10/06/2012   Procedure: HERNIA REPAIR UMBILICAL ADULT;  Surgeon: Imogene Burn. Georgette Dover, MD;  Location: [facility];  Service: General;  Laterality: N/A;   Social History   Socioeconomic History  . Marital status: Married    Spouse name: Not on file  . Number of children: Not on file  . Years of education: Not on file    . Highest education level: Not on file  Occupational History  . Not on file  Social Needs  . Financial resource strain: Not on file  . Food insecurity:    Worry: Not on file    Inability: Not on file  . Transportation needs:    Medical: Not on file    Non-medical: Not on file  Tobacco Use  . Smoking status: Never Smoker  Substance and Sexual Activity  . Alcohol use: Yes    Comment: OCCAS ALCOHOL  . Drug use: No  . Sexual activity: Not on file  Lifestyle  . Physical activity:    Days per week: Not on file    Minutes per session: Not on file  . Stress: Not on file  Relationships  . Social connections:    Talks on phone: Not on file    Gets together: Not on file    Attends religious service: Not on file    Active member of club or organization: Not on file    Attends meetings of clubs or organizations: Not on file    Relationship status: Not on file  Other Topics Concern  . Not on file  Social History Narrative   ** Merged History Encounter **       Allergies  Allergen Reactions  .  Crestor [Rosuvastatin]     MUSCLE CRAMPS  . Lipitor [Atorvastatin]     Liver toxicity   Family History  Problem Relation Age of Onset  . Stroke Mother   . Heart disease Father   . Cancer Sister        bone    Current Outpatient Medications (Endocrine & Metabolic):  .  predniSONE (DELTASONE) 50 MG tablet, Take 1 tablet (50 mg total) by mouth daily.  Current Outpatient Medications (Cardiovascular):  .  nitroGLYCERIN (NITRODUR - DOSED IN MG/24 HR) 0.2 mg/hr patch, 1/4 patch daily  Current Outpatient Medications (Respiratory):  .  albuterol (PROVENTIL HFA;VENTOLIN HFA) 108 (90 Base) MCG/ACT inhaler, Inhale 2 puffs into the lungs every 4 (four) hours as needed for wheezing or shortness of breath.  Current Outpatient Medications (Analgesics):  .  acetaminophen (TYLENOL) 500 MG tablet, Take 2 tablets (1,000 mg total) by mouth every 8 (eight) hours as needed for mild pain. [name] Kitchen  ibuprofen  (ADVIL,MOTRIN) 800 MG tablet, Take 1 tablet (800 mg total) by mouth every 8 (eight) hours as needed for moderate pain. .  traMADol (ULTRAM) 50 MG tablet, Take 1 tablet (50 mg total) by mouth every 8 (eight) hours as needed.  Current Outpatient Medications (Hematological):  [name] Kitchen  Cyanocobalamin (VITAMIN B 12 PO), Take 1 tablet by mouth daily. .  vitamin B-12 (CYANOCOBALAMIN) 1000 MCG tablet, Take 1,000 mcg by mouth daily.  Current Outpatient Medications (Other):  [name] Kitchen  Calcium Carb-Cholecalciferol (LIQUID CALCIUM WITH D3) [phone] MG-UNIT CAPS, Take 150 mcg by mouth. .  calcium-vitamin D (OSCAL WITH D) 500-200 MG-UNIT tablet, Take 1 tablet by mouth daily with breakfast. .  fish oil-omega-3 fatty acids 1000 MG capsule, Take 4 g by mouth daily.  .  Niacin CR 750 MG TBCR, Take 2 tablets by mouth every morning. .  vitamin A 10000 UNIT capsule, Take 3,000 Units by mouth daily.    Past medical history, social, surgical and family history all reviewed in electronic medical record.  No pertanent information unless stated regarding to the chief complaint.   Review of Systems:  No headache, visual changes, nausea, vomiting, diarrhea, constipation, dizziness, abdominal pain, skin rash, fevers, chills, night sweats, weight loss, swollen lymph nodes, body aches, joint swelling,, chest pain, shortness of breath, mood changes.  Positive muscle aches  Objective  Blood pressure 128/82, pulse (!) 58, height 5\' 10"  (1.778 m), weight 159 lb (72.1 kg), SpO2 97 %.    General: No apparent distress alert and oriented x3 mood and affect normal, dressed appropriately.  HEENT: Pupils equal, extraocular movements intact  Respiratory: Patient's speak in full sentences and does not appear short of breath  Cardiovascular: No lower extremity edema, non tender, no erythema  Skin: Warm dry intact with no signs of infection or rash on extremities or on axial skeleton.  Abdomen: Soft nontender  Neuro: Cranial nerves II through  XII are intact, neurovascularly intact in all extremities with 2+ DTRs and 2+ pulses.  Lymph: No lymphadenopathy of posterior or anterior cervical chain or axillae bilaterally.  Gait normal with good balance and coordination.  MSK:  Non tender with full range of motion and good stability and symmetric strength and tone of  elbows, wrist, hip, knee and ankles bilaterally.  Mild arthritic changes of multiple joints  Left shoulder exam still shows a positive crossover and positive O'Brien's painful rotator cuff strength.  Mild impingement sign still noted.  Shows some mild swelling as well as some mild swelling of the left  lower extremity.  Fullness noted of the popliteal area.  Mild loss of the last 10 degrees of flexion.   Procedure: Real-time Ultrasound Guided Injection of left knee Device: GE Logiq Q7 Ultrasound guided injection is preferred based studies that show increased duration, increased effect, greater accuracy, decreased procedural pain, increased response rate, and decreased cost with ultrasound guided versus blind injection.  Verbal informed consent obtained.  Time-out conducted.  Noted no overlying erythema, induration, or other signs of local infection.  Skin prepped in a sterile fashion.  Local anesthesia: Topical Ethyl chloride.  With sterile technique and under real time ultrasound guidance: With a 22-gauge 2 inch needle patient was injected with 4 cc of 0.5% Marcaine and aspirated 20 cc of straw-colored fluid 1 cc of Kenalog 40 mg/dL. This was from a posterior approach.  Completed without difficulty  Pain immediately resolved suggesting accurate placement of the medication.  Advised to call if fevers/chills, erythema, induration, drainage, or persistent bleeding.  Images permanently stored and available for review in the ultrasound unit.  Impression: Technically successful ultrasound guided injection.  Procedure: Real-time Ultrasound Guided Injection of left chromic  clavicular joint Device: GE Logiq E  Ultrasound guided injection is preferred based studies that show increased duration, increased effect, greater accuracy, decreased procedural pain, increased response rate with ultrasound guided versus blind injection.  Verbal informed consent obtained.  Time-out conducted.  Noted no overlying erythema, induration, or other signs of local infection.  Skin prepped in a sterile fashion.  Local anesthesia: Topical Ethyl chloride.  With sterile technique and under real time ultrasound guidance:  Joint visualized.  21g 2 inch needle inserted superior approach. Pictures taken for needle placement. Patient did have injection of 0.5 cc of 0.5% Marcaine, and 1cc of Kenalog 40 mg/dL. Completed without difficulty  Pain immediately resolved suggesting accurate placement of the medication.  Advised to call if fevers/chills, erythema, induration, drainage, or persistent bleeding.  Images permanently stored and available for review in the ultrasound unit.  Impression: Technically successful ultrasound guided injection.    Impression and Recommendations:     This case required medical decision making of moderate complexity. The above documentation has been reviewed and is accurate and complete Paul Pulley, DO       Note: This dictation was prepared with Dragon dictation along with smaller phrase technology. Any transcriptional errors that result from this process are unintentional.

## 2018-08-19 ENCOUNTER — Encounter: Payer: Self-pay | Admitting: Family Medicine

## 2018-08-19 ENCOUNTER — Ambulatory Visit (INDEPENDENT_AMBULATORY_CARE_PROVIDER_SITE_OTHER): Payer: Medicare Other | Admitting: Family Medicine

## 2018-08-19 ENCOUNTER — Ambulatory Visit: Payer: Self-pay

## 2018-08-19 VITALS — BP 128/82 | HR 58 | Ht 70.0 in | Wt 159.0 lb

## 2018-08-19 DIAGNOSIS — M25512 Pain in left shoulder: Secondary | ICD-10-CM | POA: Diagnosis not present

## 2018-08-19 DIAGNOSIS — T733XXD Exhaustion due to excessive exertion, subsequent encounter: Secondary | ICD-10-CM | POA: Diagnosis not present

## 2018-08-19 DIAGNOSIS — M79605 Pain in left leg: Secondary | ICD-10-CM

## 2018-08-19 DIAGNOSIS — M7122 Synovial cyst of popliteal space [Baker], left knee: Secondary | ICD-10-CM | POA: Diagnosis not present

## 2018-08-19 NOTE — Assessment & Plan Note (Signed)
Patient had aspiration done.  Discussed icing regimen and home exercise.  Discussed topical anti-inflammatories and compression.  Follow-up again in 4 weeks

## 2018-08-19 NOTE — Patient Instructions (Signed)
Good to see you  Ice is your friend Continue compression on the legs Try the new inhaler daily  Injected the [facility] joint as well  Have a great race  See me again in 3-4 weeks

## 2018-08-19 NOTE — Assessment & Plan Note (Signed)
Injected August 19, 2018.  Tolerated the procedure well.  Discussed icing regimen and home exercise.  Discussed topical anti-inflammatories.  We will see how patient responds.  Follow-up again in 4 weeks

## 2018-08-19 NOTE — Assessment & Plan Note (Signed)
Likely diaphragmatic irritation patient with history of broken ribs.  Given trial of inhaled corticosteroids

## 2018-08-20 ENCOUNTER — Ambulatory Visit (INDEPENDENT_AMBULATORY_CARE_PROVIDER_SITE_OTHER): Payer: Medicare Other | Admitting: Family Medicine

## 2018-08-20 ENCOUNTER — Encounter: Payer: Self-pay | Admitting: Family Medicine

## 2018-08-20 DIAGNOSIS — M79A22 Nontraumatic compartment syndrome of left lower extremity: Secondary | ICD-10-CM | POA: Insufficient documentation

## 2018-08-20 MED ORDER — CILOSTAZOL 50 MG PO TABS: 25.0000 mg | ORAL_TABLET | Freq: Two times a day (BID) | ORAL | 0 refills | Status: DC

## 2018-08-20 MED ORDER — PREDNISONE 50 MG PO TABS: 50.0000 mg | ORAL_TABLET | Freq: Every day | ORAL | 0 refills | Status: DC

## 2018-08-20 NOTE — Assessment & Plan Note (Signed)
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  Patient is having worsening nail secondary to the rupture of the Baker's cyst.  We discussed with patient to that the differential includes an anterior tibialis injury and giving difficulty with the lateral compartment.  Patient does have good dorsalis pulses noted today.  Neurovascularly intact on exam.  Negative compression of the calf causing pain no.  Do not feel that deep venous thrombosis is necessary.  Do not believe opening pressures would be beneficial for patient.  Patient will do compression, orthotics, prednisone, start Pletal as well as gabapentin which patient has done previously.  Given the medications.  Restart nitroglycerin anteriorly.  Follow-up again 2 weeks

## 2018-08-20 NOTE — Progress Notes (Signed)
[name] [place] [address] [place], [place] [address] Phone: [phone] Subjective:   [name], am serving as a scribe for Dr. Hulan Saas.   I'm seeing this patient by the request  of:    CC: Left leg pain  [id number]  Paul [name] is a 67 y.o. male coming in with complaint of tibialis anterior pain. Pedaled down to 120 when he usually is at 230 watts. Patient has excruciating pain over the muscle following the ride. Was able to swim today without pain for 1 hour.  Patient states that as long as he sits and elevates the pain seems to go away.  When he tried to bring his foot up so into a dorsiflexion severe amount of pain. Patient states if he stays still no pain.  And even with weightbearing he seems to do well.  Unfortunately as when he was only pulling up his ankle.     Past Medical History:  Diagnosis Date  . Depression    RESOLVED  . Hyperlipidemia   . Spinal stenosis    WAS HAVING NECK PAIN -PT STATES NERVE ABLATION PROCEDURE 2012 AND PAIN HAS RESOLVED  . Spondylitis [facility])    Past Surgical History:  Procedure Laterality Date  . APPENDECTOMY  1962  . HERNIA REPAIR    . INSERTION OF MESH  10/06/2012   Procedure: INSERTION OF MESH;  Surgeon: Imogene Burn. Georgette Dover, MD;  Location: [facility];  Service: General;  Laterality: N/A;  . [place]   left  . TONSILLECTOMY  1968  . UMBILICAL HERNIA REPAIR  10/06/2012   Procedure: HERNIA REPAIR UMBILICAL ADULT;  Surgeon: Imogene Burn. Georgette Dover, MD;  Location: [facility];  Service: General;  Laterality: N/A;   Social History   Socioeconomic History  . Marital status: Married    Spouse name: Not on file  . Number of children: Not on file  . Years of education: Not on file  . Highest education level: Not on file  Occupational History  . Not on file  Social Needs  . Financial resource strain: Not on file  . Food insecurity:    Worry: Not on file    Inability: Not on file  .  Transportation needs:    Medical: Not on file    Non-medical: Not on file  Tobacco Use  . Smoking status: Never Smoker  Substance and Sexual Activity  . Alcohol use: Yes    Comment: OCCAS ALCOHOL  . Drug use: No  . Sexual activity: Not on file  Lifestyle  . Physical activity:    Days per week: Not on file    Minutes per session: Not on file  . Stress: Not on file  Relationships  . Social connections:    Talks on phone: Not on file    Gets together: Not on file    Attends religious service: Not on file    Active member of club or organization: Not on file    Attends meetings of clubs or organizations: Not on file    Relationship status: Not on file  Other Topics Concern  . Not on file  Social History Narrative   ** Merged History Encounter **       Allergies  Allergen Reactions  . Crestor [Rosuvastatin]     MUSCLE CRAMPS  . Lipitor [Atorvastatin]     Liver toxicity   Family History  Problem Relation Age of Onset  . Stroke Mother   .  Heart disease Father   . Cancer Sister        bone    Current Outpatient Medications (Endocrine & Metabolic):  .  predniSONE (DELTASONE) 50 MG tablet, Take 1 tablet (50 mg total) by mouth daily.  Current Outpatient Medications (Cardiovascular):  .  nitroGLYCERIN (NITRODUR - DOSED IN MG/24 HR) 0.2 mg/hr patch, 1/4 patch daily  Current Outpatient Medications (Respiratory):  .  albuterol (PROVENTIL HFA;VENTOLIN HFA) 108 (90 Base) MCG/ACT inhaler, Inhale 2 puffs into the lungs every 4 (four) hours as needed for wheezing or shortness of breath.  Current Outpatient Medications (Analgesics):  .  acetaminophen (TYLENOL) 500 MG tablet, Take 2 tablets (1,000 mg total) by mouth every 8 (eight) hours as needed for mild pain. [name] Kitchen  ibuprofen (ADVIL,MOTRIN) 800 MG tablet, Take 1 tablet (800 mg total) by mouth every 8 (eight) hours as needed for moderate pain. .  traMADol (ULTRAM) 50 MG tablet, Take 1 tablet (50 mg total) by mouth every 8 (eight)  hours as needed.  Current Outpatient Medications (Hematological):  [name] Kitchen  Cyanocobalamin (VITAMIN B 12 PO), Take 1 tablet by mouth daily. .  vitamin B-12 (CYANOCOBALAMIN) 1000 MCG tablet, Take 1,000 mcg by mouth daily. .  cilostazol (PLETAL) 50 MG tablet, Take 0.5 tablets (25 mg total) by mouth 2 (two) times daily.  Current Outpatient Medications (Other):  [name] Kitchen  Calcium Carb-Cholecalciferol (LIQUID CALCIUM WITH D3) [phone] MG-UNIT CAPS, Take 150 mcg by mouth. .  calcium-vitamin D (OSCAL WITH D) 500-200 MG-UNIT tablet, Take 1 tablet by mouth daily with breakfast. .  fish oil-omega-3 fatty acids 1000 MG capsule, Take 4 g by mouth daily.  .  Niacin CR 750 MG TBCR, Take 2 tablets by mouth every morning. .  vitamin A 10000 UNIT capsule, Take 3,000 Units by mouth daily.    Past medical history, social, surgical and family history all reviewed in electronic medical record.  No pertanent information unless stated regarding to the chief complaint.   Review of Systems:  No headache, visual changes, nausea, vomiting, diarrhea, constipation, dizziness, abdominal pain, skin rash, fevers, chills, night sweats, weight loss, swollen lymph nodes, body aches, joint swelling, chest pain, shortness of breath, mood changes.  Positive muscle aches  Objective  Blood pressure 128/80, pulse 78, height 5\' 10"  (1.778 m), weight 159 lb (72.1 kg), SpO2 97 %.    General: No apparent distress alert and oriented x3 mood and affect normal, dressed appropriately.  HEENT: Pupils equal, extraocular movements intact  Respiratory: Patient's speak in full sentences and does not appear short of breath  Cardiovascular: No lower extremity edema, non tender, no erythema  Skin: Warm dry intact with no signs of infection or rash on extremities or on axial skeleton.  Abdomen: Soft nontender  Neuro: Cranial nerves II through XII are intact, neurovascularly intact in all extremities with 2+ DTRs and 2+ pulses.  Lymph: No lymphadenopathy  of posterior or anterior cervical chain or axillae bilaterally.  Gait normal with good balance and coordination.  MSK:  Non tender with full range of motion and good stability and symmetric strength and tone of shoulders, elbows, wrist, hip, and ankles bilaterally.  Mild arthritic changes of multiple joints including patient shoulder still. Patient's left knee shows that there is less swelling than previous exam yesterday.  Patient's lower extremity does have some weakness to the leg but not significantly different than the contralateral side.  Dorsal pedis pulses felt.  Patient has pain over the anterior tibialis and the lateral compartment somewhat.  Patient though with compression does not have worsening pain and only pain with resisted dorsiflexion of the ankle.  Capillary refill noted distally.  Negative compression of the calf today.      Impression and Recommendations:     This case required medical decision making of moderate complexity. The above documentation has been reviewed and is accurate and complete [name], DO       Note: This dictation was prepared with Dragon dictation along with smaller phrase technology. Any transcriptional errors that result from this process are unintentional.

## 2018-08-20 NOTE — Patient Instructions (Addendum)
Good to see you  I am sorry you are hurting Start prednisone  Pletal 1/2 tab 2 times daily  Gabapentin 200mg  at night Wear the boot with walking at all times except sport Heel lift at the toes of shoe may help  Worsening pain go to ER Send me a message on Monday

## 2018-08-24 DIAGNOSIS — Z23 Encounter for immunization: Secondary | ICD-10-CM | POA: Diagnosis not present

## 2018-09-02 NOTE — Progress Notes (Signed)
[name] [address] [place], [place] [address] Phone: [phone] Subjective:    I'm seeing this patient by the request  of:    CC: Left leg pain follow-up  [id number]  Paul [name] is a 67 y.o. male coming in with complaint of left leg pain follow-up.  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  To have an anterior tibialis muscle tear and then caused severe amount of pain that kept patient from his last triathlon.  Overall about 80% better.  Patient was able to run the other day with very minimal discomfort and was able to bike for 40 minutes.  Patient is doing the nitroglycerin and did not put a heel lift in the front of the shoe.    Past Medical History:  Diagnosis Date  . Depression    RESOLVED  . Hyperlipidemia   . Spinal stenosis    WAS HAVING NECK PAIN -PT STATES NERVE ABLATION PROCEDURE 2012 AND PAIN HAS RESOLVED  . Spondylitis [facility])    Past Surgical History:  Procedure Laterality Date  . APPENDECTOMY  1962  . HERNIA REPAIR    . INSERTION OF MESH  10/06/2012   Procedure: INSERTION OF MESH;  Surgeon: Imogene Burn. Georgette Dover, MD;  Location: [facility];  Service: General;  Laterality: N/A;  . [place]   left  . TONSILLECTOMY  1968  . UMBILICAL HERNIA REPAIR  10/06/2012   Procedure: HERNIA REPAIR UMBILICAL ADULT;  Surgeon: Imogene Burn. Georgette Dover, MD;  Location: [facility];  Service: General;  Laterality: N/A;   Social History   Socioeconomic History  . Marital status: Married    Spouse name: Not on file  . Number of children: Not on file  . Years of education: Not on file  . Highest education level: Not on file  Occupational History  . Not on file  Social Needs  . Financial resource strain: Not on file  . Food insecurity:    Worry: Not on file    Inability: Not on file  . Transportation needs:    Medical: Not on file    Non-medical: Not on file  Tobacco Use  .  Smoking status: Never Smoker  . Smokeless tobacco: Never Used  Substance and Sexual Activity  . Alcohol use: Yes    Comment: OCCAS ALCOHOL  . Drug use: No  . Sexual activity: Not on file  Lifestyle  . Physical activity:    Days per week: Not on file    Minutes per session: Not on file  . Stress: Not on file  Relationships  . Social connections:    Talks on phone: Not on file    Gets together: Not on file    Attends religious service: Not on file    Active member of club or organization: Not on file    Attends meetings of clubs or organizations: Not on file    Relationship status: Not on file  Other Topics Concern  . Not on file  Social History Narrative   ** Merged History Encounter **       Allergies  Allergen Reactions  . Crestor [Rosuvastatin]     MUSCLE CRAMPS  . Lipitor [Atorvastatin]     Liver toxicity   Family History  Problem Relation Age of Onset  . Stroke Mother   . Heart disease Father   . Cancer Sister  bone    Current Outpatient Medications (Endocrine & Metabolic):  .  predniSONE (DELTASONE) 50 MG tablet, Take 1 tablet (50 mg total) by mouth daily.  Current Outpatient Medications (Cardiovascular):  .  nitroGLYCERIN (NITRODUR - DOSED IN MG/24 HR) 0.2 mg/hr patch, 1/4 patch daily  Current Outpatient Medications (Respiratory):  .  albuterol (PROVENTIL HFA;VENTOLIN HFA) 108 (90 Base) MCG/ACT inhaler, Inhale 2 puffs into the lungs every 4 (four) hours as needed for wheezing or shortness of breath.  Current Outpatient Medications (Analgesics):  .  acetaminophen (TYLENOL) 500 MG tablet, Take 2 tablets (1,000 mg total) by mouth every 8 (eight) hours as needed for mild pain. [name] Kitchen  ibuprofen (ADVIL,MOTRIN) 800 MG tablet, Take 1 tablet (800 mg total) by mouth every 8 (eight) hours as needed for moderate pain. .  traMADol (ULTRAM) 50 MG tablet, Take 1 tablet (50 mg total) by mouth every 8 (eight) hours as needed.  Current Outpatient Medications  (Hematological):  .  cilostazol (PLETAL) 50 MG tablet, Take 0.5 tablets (25 mg total) by mouth 2 (two) times daily. .  Cyanocobalamin (VITAMIN B 12 PO), Take 1 tablet by mouth daily. .  vitamin B-12 (CYANOCOBALAMIN) 1000 MCG tablet, Take 1,000 mcg by mouth daily.  Current Outpatient Medications (Other):  [name] Kitchen  Calcium Carb-Cholecalciferol (LIQUID CALCIUM WITH D3) [phone] MG-UNIT CAPS, Take 150 mcg by mouth. .  calcium-vitamin D (OSCAL WITH D) 500-200 MG-UNIT tablet, Take 1 tablet by mouth daily with breakfast. .  fish oil-omega-3 fatty acids 1000 MG capsule, Take 4 g by mouth daily.  .  Niacin CR 750 MG TBCR, Take 2 tablets by mouth every morning. .  vitamin A 10000 UNIT capsule, Take 3,000 Units by mouth daily.    Past medical history, social, surgical and family history all reviewed in electronic medical record.  No pertanent information unless stated regarding to the chief complaint.   Review of Systems:  No headache, visual changes, nausea, vomiting, diarrhea, constipation, dizziness, abdominal pain, skin rash, fevers, chills, night sweats, weight loss, swollen lymph nodes, body aches, joint swelling, muscle aches, chest pain, shortness of breath, mood changes.   Objective  Blood pressure 110/64, pulse 66, height 5\' 10"  (1.778 m), weight 161 lb (73 kg), SpO2 98 %.    General: No apparent distress alert and oriented x3 mood and affect normal, dressed appropriately.  HEENT: Pupils equal, extraocular movements intact  Respiratory: Patient's speak in full sentences and does not appear short of breath  Cardiovascular: No lower extremity edema, non tender, no erythema  Skin: Warm dry intact with no signs of infection or rash on extremities or on axial skeleton.  Abdomen: Soft nontender  Neuro: Cranial nerves II through XII are intact, neurovascularly intact in all extremities with 2+ DTRs and 2+ pulses.  Lymph: No lymphadenopathy of posterior or anterior cervical chain or axillae  bilaterally.  Gait mild antalgic MSK:  tender with full range of motion and good stability and symmetric strength and tone of shoulders, elbows, wrist, hip, knee and ankles bilaterally.  Mild arthritic changes of multiple joints  Limited musculoskeletal ultrasound was performed and interpreted by [name]  Limited ultrasound of patient's anterior tibialis muscle shows that patient does have a very small tear with some healing noted.  Increase in Doppler flow noted. Impression: Anterior tibialis muscle tear    Impression and Recommendations:     mentation has been reviewed and is accurate and complete [name], DO       Note: This  dictation was prepared with Dragon dictation along with smaller phrase technology. Any transcriptional errors that result from this process are unintentional.

## 2018-09-04 ENCOUNTER — Ambulatory Visit (INDEPENDENT_AMBULATORY_CARE_PROVIDER_SITE_OTHER): Payer: Medicare Other | Admitting: Family Medicine

## 2018-09-04 ENCOUNTER — Encounter: Payer: Self-pay | Admitting: Family Medicine

## 2018-09-04 ENCOUNTER — Ambulatory Visit: Payer: Self-pay

## 2018-09-04 VITALS — BP 110/64 | HR 66 | Ht 70.0 in | Wt 161.0 lb

## 2018-09-04 DIAGNOSIS — M79A22 Nontraumatic compartment syndrome of left lower extremity: Secondary | ICD-10-CM | POA: Diagnosis not present

## 2018-09-04 NOTE — Patient Instructions (Addendum)
Good to see you   Ice is your friend Continue compression but more the knee then the calf Keep foot neutral on the bike Focus more on weight lifting and muscle growth for now.  Discontinue the pletal  See me again in 6 weeks

## 2018-09-04 NOTE — Assessment & Plan Note (Signed)
I do believe that patient's Baker's cyst as well as anterior tibialis muscle tear is likely contributing to more of an exertional compartment syndrome.  Patient is making progress.  Continue conservative therapy.  Discontinue blood pressure medication that was seem to be helping with vasodilation.  Follow-up again in 4 weeks

## 2018-09-15 ENCOUNTER — Ambulatory Visit: Payer: Medicare Other | Admitting: Family Medicine

## 2018-09-18 DIAGNOSIS — M9903 Segmental and somatic dysfunction of lumbar region: Secondary | ICD-10-CM | POA: Diagnosis not present

## 2018-09-18 DIAGNOSIS — S39012A Strain of muscle, fascia and tendon of lower back, initial encounter: Secondary | ICD-10-CM | POA: Diagnosis not present

## 2018-09-18 DIAGNOSIS — M9901 Segmental and somatic dysfunction of cervical region: Secondary | ICD-10-CM | POA: Diagnosis not present

## 2018-09-18 DIAGNOSIS — M47812 Spondylosis without myelopathy or radiculopathy, cervical region: Secondary | ICD-10-CM | POA: Diagnosis not present

## 2018-09-18 DIAGNOSIS — M47814 Spondylosis without myelopathy or radiculopathy, thoracic region: Secondary | ICD-10-CM | POA: Diagnosis not present

## 2018-09-18 DIAGNOSIS — M9902 Segmental and somatic dysfunction of thoracic region: Secondary | ICD-10-CM | POA: Diagnosis not present

## 2018-10-11 NOTE — Progress Notes (Signed)
[name] [address] [place], [place] [address] Phone: [phone] Subjective:    I Paul [name] am serving as a Education administrator for Dr. Hulan Saas.   CC: left leg pain   [id number]  Paul [name] is a 67 y.o. male coming in with complaint of left leg pain. States that he hasn't been training. Leg is feeling good. Swimming 3x a week. Shoulder is doing well. Only doing elliptical and core work. Cramping in the right foot while swimming. If he doesn't stop to stretch it pain radiates up the calf. Happens every time he swims after 1200 yards of swimming (20 mins).    Onset- last 6 weeks Location- Right arch pain Duration-  Character- Aggravating factors- Reliving factors-  Therapies tried-  Severity-5 out of 10     Past Medical History:  Diagnosis Date  . Depression    RESOLVED  . Hyperlipidemia   . Spinal stenosis    WAS HAVING NECK PAIN -PT STATES NERVE ABLATION PROCEDURE 2012 AND PAIN HAS RESOLVED  . Spondylitis [facility])    Past Surgical History:  Procedure Laterality Date  . APPENDECTOMY  1962  . HERNIA REPAIR    . INSERTION OF MESH  10/06/2012   Procedure: INSERTION OF MESH;  Surgeon: Imogene Burn. Georgette Dover, MD;  Location: [facility];  Service: General;  Laterality: N/A;  . [place]   left  . TONSILLECTOMY  1968  . UMBILICAL HERNIA REPAIR  10/06/2012   Procedure: HERNIA REPAIR UMBILICAL ADULT;  Surgeon: Imogene Burn. Georgette Dover, MD;  Location: [facility];  Service: General;  Laterality: N/A;   Social History   Socioeconomic History  . Marital status: Married    Spouse name: Not on file  . Number of children: Not on file  . Years of education: Not on file  . Highest education level: Not on file  Occupational History  . Not on file  Social Needs  . Financial resource strain: Not on file  . Food insecurity:    Worry: Not on file    Inability: Not on file  . Transportation needs:    Medical: Not on file    Non-medical: Not on  file  Tobacco Use  . Smoking status: Never Smoker  . Smokeless tobacco: Never Used  Substance and Sexual Activity  . Alcohol use: Yes    Comment: OCCAS ALCOHOL  . Drug use: No  . Sexual activity: Not on file  Lifestyle  . Physical activity:    Days per week: Not on file    Minutes per session: Not on file  . Stress: Not on file  Relationships  . Social connections:    Talks on phone: Not on file    Gets together: Not on file    Attends religious service: Not on file    Active member of club or organization: Not on file    Attends meetings of clubs or organizations: Not on file    Relationship status: Not on file  Other Topics Concern  . Not on file  Social History Narrative   ** Merged History Encounter **       Allergies  Allergen Reactions  . Crestor [Rosuvastatin]     MUSCLE CRAMPS  . Lipitor [Atorvastatin]     Liver toxicity   Family History  Problem Relation Age of Onset  . Stroke Mother   . Heart disease Father   . Cancer Sister  bone    Current Outpatient Medications (Endocrine & Metabolic):  .  predniSONE (DELTASONE) 50 MG tablet, Take 1 tablet (50 mg total) by mouth daily. (Patient not taking: Reported on 10/12/2018)  Current Outpatient Medications (Cardiovascular):  .  nitroGLYCERIN (NITRODUR - DOSED IN MG/24 HR) 0.2 mg/hr patch, 1/4 patch daily (Patient not taking: Reported on 10/12/2018)  Current Outpatient Medications (Respiratory):  .  albuterol (PROVENTIL HFA;VENTOLIN HFA) 108 (90 Base) MCG/ACT inhaler, Inhale 2 puffs into the lungs every 4 (four) hours as needed for wheezing or shortness of breath.  Current Outpatient Medications (Analgesics):  .  acetaminophen (TYLENOL) 500 MG tablet, Take 2 tablets (1,000 mg total) by mouth every 8 (eight) hours as needed for mild pain. (Patient not taking: Reported on 10/12/2018) .  ibuprofen (ADVIL,MOTRIN) 800 MG tablet, Take 1 tablet (800 mg total) by mouth every 8 (eight) hours as needed for moderate  pain. (Patient not taking: Reported on 10/12/2018) .  traMADol (ULTRAM) 50 MG tablet, Take 1 tablet (50 mg total) by mouth every 8 (eight) hours as needed. (Patient not taking: Reported on 10/12/2018)  Current Outpatient Medications (Hematological):  .  ferrous sulfate 325 (65 FE) MG EC tablet, Take 65 mg by mouth daily. .  folic acid (FOLVITE) 1 MG tablet, Take 800 mcg by mouth daily. .  Cyanocobalamin (VITAMIN B 12 PO), Take 1 tablet by mouth daily. .  vitamin B-12 (CYANOCOBALAMIN) 1000 MCG tablet, Take 1,000 mcg by mouth daily.  Current Outpatient Medications (Other):  .  cholecalciferol (VITAMIN D3) 10 MCG (400 UNIT) TABS tablet, Take 5,000 Units by mouth daily. [name] Kitchen  co-enzyme Q-10 30 MG capsule, Take 200 mg by mouth daily. .  fish oil-omega-3 fatty acids 1000 MG capsule, Take 2 g by mouth daily.  .  L-ARGININE-500 PO, Take 400 mg by mouth. Daily .  Multiple Vitamins-Minerals (CULTURELLE PROBIOTICS + MULTIV PO), Take by mouth daily. .  Niacin CR 750 MG TBCR, Take 2 tablets by mouth every morning. .  Quercetin 250 MG TABS, Take 500 mg by mouth daily. .  vitamin C (ASCORBIC ACID) 500 MG tablet, Take 500 mg by mouth daily. .  Calcium Carb-Cholecalciferol (LIQUID CALCIUM WITH D3) [phone] MG-UNIT CAPS, Take 150 mcg by mouth. .  calcium-vitamin D (OSCAL WITH D) 500-200 MG-UNIT tablet, Take 1 tablet by mouth daily with breakfast. .  vitamin A 10000 UNIT capsule, Take 3,000 Units by mouth daily.    Past medical history, social, surgical and family history all reviewed in electronic medical record.  No pertanent information unless stated regarding to the chief complaint.   Review of Systems:  No headache, visual changes, nausea, vomiting, diarrhea, constipation, dizziness, abdominal pain, skin rash, fevers, chills, night sweats, weight loss, swollen lymph nodes, body aches, joint swelling, muscle aches, chest pain, shortness of breath, mood changes.   Objective  Blood pressure 122/70, pulse  (!) 58, height 5\' 10"  (1.778 m), weight 153 lb (69.4 kg), SpO2 98 %.    General: No apparent distress alert and oriented x3 mood and affect normal, dressed appropriately.  HEENT: Pupils equal, extraocular movements intact  Respiratory: Patient's speak in full sentences and does not appear short of breath  Cardiovascular: No lower extremity edema, non tender, no erythema  Skin: Warm dry intact with no signs of infection or rash on extremities or on axial skeleton.  Abdomen: Soft nontender  Neuro: Cranial nerves II through XII are intact, neurovascularly intact in all extremities with 2+ DTRs and 2+ pulses.  Lymph:  No lymphadenopathy of posterior or anterior cervical chain or axillae bilaterally.  Gait normal with good balance and coordination.  MSK:  tender with limited range of motion and good stability and symmetric strength and tone of shoulders, elbows, wrist, hip, knee and ankles bilaterally.  Mild arthritic changes of multiple joints still noted.    Impression and Recommendations:      The above documentation has been reviewed and is accurate and complete [name], DO       Note: This dictation was prepared with Dragon dictation along with smaller phrase technology. Any transcriptional errors that result from this process are unintentional.

## 2018-10-12 ENCOUNTER — Encounter: Payer: Self-pay | Admitting: Family Medicine

## 2018-10-12 ENCOUNTER — Ambulatory Visit (INDEPENDENT_AMBULATORY_CARE_PROVIDER_SITE_OTHER): Payer: Medicare Other | Admitting: Family Medicine

## 2018-10-12 DIAGNOSIS — M79A22 Nontraumatic compartment syndrome of left lower extremity: Secondary | ICD-10-CM

## 2018-10-12 DIAGNOSIS — M7122 Synovial cyst of popliteal space [Baker], left knee: Secondary | ICD-10-CM | POA: Diagnosis not present

## 2018-10-12 NOTE — Assessment & Plan Note (Signed)
Significant improvement.  Continue conservative therapy.  Patient will increase activity now and some of the things that seem to exacerbate it previously.  Please see how patient responds and follow-up again in 4 to 6 weeks

## 2018-10-12 NOTE — Patient Instructions (Signed)
Good to see you  You are doing great  Add in the bike for 2 weeks Heel lift right and see if it help  See me again in 6 weeks

## 2018-10-12 NOTE — Assessment & Plan Note (Signed)
Stable.  No significant reaccumulation noted today.

## 2018-10-21 DIAGNOSIS — D518 Other vitamin B12 deficiency anemias: Secondary | ICD-10-CM | POA: Diagnosis not present

## 2018-10-21 DIAGNOSIS — Z0000 Encounter for general adult medical examination without abnormal findings: Secondary | ICD-10-CM | POA: Diagnosis not present

## 2018-10-21 DIAGNOSIS — L308 Other specified dermatitis: Secondary | ICD-10-CM | POA: Diagnosis not present

## 2018-10-21 DIAGNOSIS — E559 Vitamin D deficiency, unspecified: Secondary | ICD-10-CM | POA: Diagnosis not present

## 2018-10-21 DIAGNOSIS — Z125 Encounter for screening for malignant neoplasm of prostate: Secondary | ICD-10-CM | POA: Diagnosis not present

## 2018-10-21 DIAGNOSIS — I2510 Atherosclerotic heart disease of native coronary artery without angina pectoris: Secondary | ICD-10-CM | POA: Diagnosis not present

## 2018-10-21 DIAGNOSIS — E291 Testicular hypofunction: Secondary | ICD-10-CM | POA: Diagnosis not present

## 2018-10-21 DIAGNOSIS — E7800 Pure hypercholesterolemia, unspecified: Secondary | ICD-10-CM | POA: Diagnosis not present

## 2018-10-26 ENCOUNTER — Telehealth: Payer: Self-pay

## 2018-10-26 NOTE — Telephone Encounter (Signed)
SENT REFERRAL TO SCHEDULING AND FILED NOTES 
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
## 2018-11-16 ENCOUNTER — Ambulatory Visit (INDEPENDENT_AMBULATORY_CARE_PROVIDER_SITE_OTHER): Payer: Medicare Other | Admitting: Family Medicine

## 2018-11-16 ENCOUNTER — Encounter: Payer: Self-pay | Admitting: Family Medicine

## 2018-11-16 ENCOUNTER — Ambulatory Visit: Payer: Self-pay

## 2018-11-16 VITALS — BP 120/72 | HR 52 | Ht 70.0 in | Wt 168.0 lb

## 2018-11-16 DIAGNOSIS — M79A22 Nontraumatic compartment syndrome of left lower extremity: Secondary | ICD-10-CM

## 2018-11-16 DIAGNOSIS — M25562 Pain in left knee: Secondary | ICD-10-CM

## 2018-11-16 DIAGNOSIS — I8392 Asymptomatic varicose veins of left lower extremity: Secondary | ICD-10-CM

## 2018-11-16 DIAGNOSIS — M25512 Pain in left shoulder: Secondary | ICD-10-CM

## 2018-11-16 DIAGNOSIS — Z96652 Presence of left artificial knee joint: Secondary | ICD-10-CM

## 2018-11-16 DIAGNOSIS — G8929 Other chronic pain: Secondary | ICD-10-CM | POA: Diagnosis not present

## 2018-11-16 MED ORDER — ALBUTEROL SULFATE HFA 108 (90 BASE) MCG/ACT IN AERS: 2.0000 | INHALATION_SPRAY | RESPIRATORY_TRACT | 6 refills | Status: DC | PRN

## 2018-11-16 NOTE — Patient Instructions (Addendum)
Good to see you  You are doing great  Keep it up  Once you are at 6 minute run and then 1 minute walk up to you how long of the run you want  Try the inhaler 30 minutes before exercise.  Stay active See me again in 6-8 weeks

## 2018-11-16 NOTE — Assessment & Plan Note (Signed)
Stable at moment.  Seems to be doing well.  Discussed icing regimen and home exercise.  Discussed which activities to do which wants to avoid.  Follow-up again in 4 to 8 weeks.

## 2018-11-16 NOTE — Assessment & Plan Note (Signed)
Continues to have difficulty with this from time to time.  Patient though is doing well.  Progressing.  We will continue to increase activity.  Patient does not have any reaccumulation of the Baker's cyst at the moment.  Patient will start to increase activity slowly.  Compression, home exercise, follow-up again in 4 to 8 weeks

## 2018-11-16 NOTE — Assessment & Plan Note (Signed)
Encourage compression

## 2018-12-08 ENCOUNTER — Ambulatory Visit: Payer: Medicare Other | Admitting: Cardiology
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
## 2019-01-04 ENCOUNTER — Encounter: Payer: Self-pay | Admitting: Family Medicine

## 2019-01-04 ENCOUNTER — Ambulatory Visit (INDEPENDENT_AMBULATORY_CARE_PROVIDER_SITE_OTHER): Payer: Medicare Other | Admitting: Family Medicine

## 2019-01-04 DIAGNOSIS — S83282D Other tear of lateral meniscus, current injury, left knee, subsequent encounter: Secondary | ICD-10-CM

## 2019-01-04 DIAGNOSIS — M25512 Pain in left shoulder: Secondary | ICD-10-CM

## 2019-01-04 DIAGNOSIS — M79A22 Nontraumatic compartment syndrome of left lower extremity: Secondary | ICD-10-CM

## 2019-01-04 NOTE — Assessment & Plan Note (Signed)
Patient doing well.  Only 1 injection necessary.  Doing well with no significant changes
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
## 2019-01-04 NOTE — Assessment & Plan Note (Signed)
Doing remarkably well as well.  No significant change.

## 2019-01-20 ENCOUNTER — Other Ambulatory Visit: Payer: Self-pay | Admitting: *Deleted

## 2019-01-20 DIAGNOSIS — M79A22 Nontraumatic compartment syndrome of left lower extremity: Secondary | ICD-10-CM

## 2019-02-08 ENCOUNTER — Ambulatory Visit: Payer: Medicare Other | Admitting: Cardiology

## 2019-03-24 ENCOUNTER — Telehealth (INDEPENDENT_AMBULATORY_CARE_PROVIDER_SITE_OTHER): Payer: Medicare Other | Admitting: Cardiology

## 2019-03-24 ENCOUNTER — Other Ambulatory Visit: Payer: Self-pay

## 2019-03-24 ENCOUNTER — Encounter: Payer: Self-pay | Admitting: *Deleted

## 2019-03-24 ENCOUNTER — Encounter: Payer: Self-pay | Admitting: Cardiology

## 2019-03-24 VITALS — Ht 70.0 in | Wt 155.0 lb

## 2019-03-24 DIAGNOSIS — Z79899 Other long term (current) drug therapy: Secondary | ICD-10-CM

## 2019-03-24 DIAGNOSIS — I2510 Atherosclerotic heart disease of native coronary artery without angina pectoris: Secondary | ICD-10-CM | POA: Insufficient documentation

## 2019-03-24 DIAGNOSIS — I2584 Coronary atherosclerosis due to calcified coronary lesion: Secondary | ICD-10-CM

## 2019-03-24 DIAGNOSIS — E782 Mixed hyperlipidemia: Secondary | ICD-10-CM | POA: Insufficient documentation

## 2019-03-24 DIAGNOSIS — R0609 Other forms of dyspnea: Secondary | ICD-10-CM | POA: Insufficient documentation

## 2019-03-24 DIAGNOSIS — Z789 Other specified health status: Secondary | ICD-10-CM

## 2019-03-24 MED ORDER — ROSUVASTATIN CALCIUM 20 MG PO TABS: 20.0000 mg | ORAL_TABLET | Freq: Every day | ORAL | 3 refills | Status: DC

## 2019-03-24 MED ORDER — ASPIRIN EC 81 MG PO TBEC: 81.0000 mg | DELAYED_RELEASE_TABLET | Freq: Every day | ORAL | 3 refills | Status: DC

## 2019-03-24 NOTE — Patient Instructions (Addendum)
Medication Instructions:  Please start Aspirin 81 mg by mouth daily. Start Crestor 20 mg by mouth daily. A prescription has been sent into your pharmacy. Continue all other medications as listed.  If you need a refill on your cardiac medications before your next appointment, please call your pharmacy.   Lab work: Please have fasting blood work in 2 months (Lipid/ALT) If you have labs (blood work) drawn today and your tests are completely normal, you will receive your results only by: [name] Kitchen MyChart Message (if you have MyChart) OR . A paper copy in the mail If you have any lab test that is abnormal or we need to change your treatment, we will call you to review the results.  Testing/Procedures: Your physician has requested that you have a lexiscan myoview.  You will be contacted to schedule this appointment which will be completed at our [place] office. You will receive instructions in the mail/MyChart once your appointment has been scheduled. Please follow instruction sheet, as given.  Follow-Up: Follow up with Dr Marlou Porch after the above testing in approximately 2 months.  Thank you for choosing [facility]!!
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
## 2019-03-30 ENCOUNTER — Telehealth (HOSPITAL_COMMUNITY): Payer: Self-pay

## 2019-03-30 NOTE — Telephone Encounter (Signed)
Pt contacted and instructions were given, he stated that he understood and would be here for his test. S.Aurie Harroun EMTP

## 2019-04-01 ENCOUNTER — Ambulatory Visit (HOSPITAL_COMMUNITY): Payer: Medicare Other | Attending: Cardiovascular Disease

## 2019-04-01 ENCOUNTER — Other Ambulatory Visit: Payer: Self-pay

## 2019-04-01 DIAGNOSIS — I2584 Coronary atherosclerosis due to calcified coronary lesion: Secondary | ICD-10-CM

## 2019-04-01 DIAGNOSIS — I2510 Atherosclerotic heart disease of native coronary artery without angina pectoris: Secondary | ICD-10-CM | POA: Insufficient documentation

## 2019-04-01 DIAGNOSIS — R0609 Other forms of dyspnea: Secondary | ICD-10-CM | POA: Diagnosis not present

## 2019-04-01 LAB — MYOCARDIAL PERFUSION IMAGING
LV dias vol: 138 mL (ref 62–150)
LV sys vol: 62 mL
Peak HR: 88 {beats}/min
Rest HR: 51 {beats}/min
SDS: 0
SRS: 0
SSS: 0
TID: 0.95

## 2019-04-01 MED ORDER — TECHNETIUM TC 99M TETROFOSMIN IV KIT
9.0000 | PACK | Freq: Once | INTRAVENOUS | Status: AC | PRN
  Administered 2019-04-01: 9 via INTRAVENOUS
  Filled 2019-04-01: qty 9

## 2019-04-01 MED ORDER — TECHNETIUM TC 99M TETROFOSMIN IV KIT
26.8000 | PACK | Freq: Once | INTRAVENOUS | Status: AC | PRN
  Administered 2019-04-01: 26.8 via INTRAVENOUS
  Filled 2019-04-01: qty 27

## 2019-04-01 MED ORDER — REGADENOSON 0.4 MG/5ML IV SOLN
0.4000 mg | Freq: Once | INTRAVENOUS | Status: AC
  Administered 2019-04-01: 0.4 mg via INTRAVENOUS

## 2019-05-03 DIAGNOSIS — M6281 Muscle weakness (generalized): Secondary | ICD-10-CM | POA: Diagnosis not present

## 2019-05-03 DIAGNOSIS — M67854 Other specified disorders of tendon, left hip: Secondary | ICD-10-CM | POA: Diagnosis not present

## 2019-05-26 ENCOUNTER — Other Ambulatory Visit: Payer: Medicare Other

## 2019-05-26 ENCOUNTER — Other Ambulatory Visit: Payer: Self-pay

## 2019-05-26 DIAGNOSIS — E782 Mixed hyperlipidemia: Secondary | ICD-10-CM

## 2019-05-26 DIAGNOSIS — Z79899 Other long term (current) drug therapy: Secondary | ICD-10-CM

## 2019-05-26 LAB — LIPID PANEL
Chol/HDL Ratio: 2 ratio (ref 0.0–5.0)
Cholesterol, Total: 157 mg/dL (ref 100–199)
HDL: 80 mg/dL (ref >39)
LDL Calculated: 58 mg/dL (ref 0–99)
Triglycerides: 96 mg/dL (ref 0–149)
VLDL Cholesterol Cal: 19 mg/dL (ref 5–40)

## 2019-05-26 LAB — ALT: ALT: 18 IU/L (ref 0–44)

## 2019-07-27 DIAGNOSIS — Z23 Encounter for immunization: Secondary | ICD-10-CM | POA: Diagnosis not present

## 2019-07-28 DIAGNOSIS — L237 Allergic contact dermatitis due to plants, except food: Secondary | ICD-10-CM | POA: Diagnosis not present
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
## 2019-08-23 ENCOUNTER — Other Ambulatory Visit: Payer: Self-pay

## 2019-08-23 ENCOUNTER — Encounter: Payer: Self-pay | Admitting: Family Medicine

## 2019-08-23 ENCOUNTER — Ambulatory Visit (INDEPENDENT_AMBULATORY_CARE_PROVIDER_SITE_OTHER): Payer: Medicare Other | Admitting: Family Medicine

## 2019-08-23 DIAGNOSIS — M79A22 Nontraumatic compartment syndrome of left lower extremity: Secondary | ICD-10-CM

## 2019-08-23 DIAGNOSIS — I2510 Atherosclerotic heart disease of native coronary artery without angina pectoris: Secondary | ICD-10-CM

## 2019-08-23 DIAGNOSIS — I2584 Coronary atherosclerosis due to calcified coronary lesion: Secondary | ICD-10-CM

## 2019-08-23 NOTE — Assessment & Plan Note (Signed)
No sign of Baker's cyst.  I discussed with patient in great length about icing regimen and home exercises, discussed with her considering which wants to avoid.  No limitations at the moment.  Follow-up again in 4 weeks

## 2019-09-07 DIAGNOSIS — I2510 Atherosclerotic heart disease of native coronary artery without angina pectoris: Secondary | ICD-10-CM | POA: Diagnosis not present

## 2019-09-07 DIAGNOSIS — E7800 Pure hypercholesterolemia, unspecified: Secondary | ICD-10-CM | POA: Diagnosis not present

## 2019-09-07 DIAGNOSIS — N528 Other male erectile dysfunction: Secondary | ICD-10-CM | POA: Diagnosis not present
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
## 2019-10-21 DIAGNOSIS — J40 Bronchitis, not specified as acute or chronic: Secondary | ICD-10-CM | POA: Diagnosis not present

## 2019-10-21 DIAGNOSIS — R05 Cough: Secondary | ICD-10-CM | POA: Diagnosis not present

## 2019-10-21 DIAGNOSIS — Z03818 Encounter for observation for suspected exposure to other biological agents ruled out: Secondary | ICD-10-CM | POA: Diagnosis not present

## 2019-10-22 DIAGNOSIS — R05 Cough: Secondary | ICD-10-CM | POA: Diagnosis not present

## 2019-12-18 ENCOUNTER — Ambulatory Visit: Payer: Medicare Other

## 2019-12-23 ENCOUNTER — Other Ambulatory Visit: Payer: Self-pay

## 2019-12-23 ENCOUNTER — Encounter: Payer: Self-pay | Admitting: Family Medicine

## 2019-12-23 MED ORDER — ALBUTEROL SULFATE HFA 108 (90 BASE) MCG/ACT IN AERS: 2.0000 | INHALATION_SPRAY | RESPIRATORY_TRACT | 3 refills | Status: DC | PRN

## 2019-12-29 ENCOUNTER — Ambulatory Visit: Payer: Medicare Other
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
## 2020-01-17 ENCOUNTER — Ambulatory Visit: Payer: Self-pay

## 2020-01-17 ENCOUNTER — Other Ambulatory Visit: Payer: Self-pay

## 2020-01-17 ENCOUNTER — Ambulatory Visit (INDEPENDENT_AMBULATORY_CARE_PROVIDER_SITE_OTHER): Payer: Medicare Other | Admitting: Family Medicine

## 2020-01-17 ENCOUNTER — Encounter: Payer: Self-pay | Admitting: Family Medicine

## 2020-01-17 VITALS — BP 138/80 | HR 51 | Ht 70.0 in | Wt 162.4 lb

## 2020-01-17 DIAGNOSIS — S86112A Strain of other muscle(s) and tendon(s) of posterior muscle group at lower leg level, left leg, initial encounter: Secondary | ICD-10-CM | POA: Diagnosis not present

## 2020-01-17 DIAGNOSIS — M79662 Pain in left lower leg: Secondary | ICD-10-CM | POA: Diagnosis not present

## 2020-01-17 NOTE — Progress Notes (Signed)
I, [name], LAT, ATC, am serving as scribe for Dr. Lynne Leader.  Paul [name] is a 69 y.o. male who presents to [place] at [facility] today for a L calf injury that began on 01/06/20 while he was running.  He states that he does triathalons and took a few days off of running.  He tried to run again on Saturday and got 2.5 miles in and had to stop due to pain.  He last saw Dr. 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  He notes he was working on increasing his speed and distance when he developed pain.  He has been able to run a little bit since then but not able to run significantly.  Cycling and swimming are okay.  No radiating pain weakness or numbness.  Yaw has a half Ironman triathlon scheduled for June.  Radiating pain: Yes into the L post-lateral lower leg Swelling: No Numbness/tingling: No Aggravating factors: Walking,running Treatments tried: Foam rolling, massage, Voltaren gel; Aleve   Pertinent review of systems: No fevers or chills.  Relevant historical information: History left leg exertional compartment syndrome.  Did not require fasciotomy.   Exam:  BP 138/80 (BP Location: Left Arm, Patient Position: Sitting, Cuff Size: Normal)   Pulse (!) 51   Ht 5\' 10"  (1.778 m)   Wt 162 lb 6.4 oz (73.7 kg)   SpO2 98%   BMI 23.30 kg/m  General: Well Developed, well nourished, and in no acute distress.   MSK: Left lower leg normal-appearing no deformity. Nontender to palpation. Normal knee and ankle motion and strength.  Pulses cap refill and sensation are intact distally.    Lab and Radiology Results Diagnostic Limited MSK Ultrasound of: Left lateral lower leg Area of pain visualized to be the musculotendinous junction of the left head of the gastrocnemius.  No significant abnormalities visualized in this region.  Bony structures and muscular tendinous structures are normal appearing. Impression: Normal left leg  ultrasound     Assessment and Plan: 69 y.o. male with left calf pain.  Patient likely had a gastrocnemius strain.  Fortunately limited musculoskeletal ultrasound today normal-appearing.  Plan for conservative management with nitroglycerin patch protocol calf compression and eccentric exercises and graduated return to running.  Additionally refer to physical therapy as he has had some benefit in the past.  He is interested in dry needling which is very reasonable.  If not improving patient will notify me.  Would proceed with imaging including x-ray and likely MRI. [name] Kitchen Caution against using Viagra or Cialis with nitroglycerin patches.  PDMP not reviewed this encounter. Orders Placed This Encounter  Procedures  . [place] LIMITED JOINT SPACE STRUCTURES LOW LEFT(NO LINKED CHARGES)    Order Specific Question:   Reason for Exam (SYMPTOM  OR DIAGNOSIS REQUIRED)    Answer:   L calf pain    Order Specific Question:   Preferred imaging location?    Answer:   [place]  . Ambulatory referral to Physical Therapy    Referral Priority:   Routine    Referral Type:   Physical Medicine    Referral Reason:   Specialty Services Required    Requested Specialty:   Physical Therapy    Number of Visits Requested:   1   No orders of the defined types were placed in this encounter.    Discussed warning signs or symptoms. Please see discharge instructions. Patient expresses understanding.   The above  documentation has been reviewed and is accurate and complete Lynne Leader

## 2020-01-17 NOTE — Patient Instructions (Addendum)
Thank you for coming in today. I think this is a gastroc strain.  Add back the nitro patches.  Focus on the eccentric calf exercises.  PT with needling can be helpful.  Let me know if you do not improve.  Can proceed to xray/MRI sooner if needed.   Keep me updated.   Nitroglycerin Protocol   Apply 1/4 nitroglycerin patch to affected area daily.  Change position of patch within the affected area every 24 hours.  You may experience a headache during the first 1-2 weeks of using the patch, these should subside.  If you experience headaches after beginning nitroglycerin patch treatment, you may take your preferred over the counter pain reliever.  Another side effect of the nitroglycerin patch is skin irritation or rash related to patch adhesive.  Please notify our office if you develop more severe headaches or rash, and stop the patch.  Tendon healing with nitroglycerin patch may require 12 to 24 weeks depending on the extent of injury.  Men should not use if taking Viagra, Cialis, or Levitra.   Do not use if you have migraines or rosacea.

## 2020-02-14 DIAGNOSIS — E559 Vitamin D deficiency, unspecified: Secondary | ICD-10-CM | POA: Diagnosis not present

## 2020-02-14 DIAGNOSIS — E7800 Pure hypercholesterolemia, unspecified: Secondary | ICD-10-CM | POA: Diagnosis not present

## 2020-02-14 DIAGNOSIS — D518 Other vitamin B12 deficiency anemias: Secondary | ICD-10-CM | POA: Diagnosis not present

## 2020-02-14 DIAGNOSIS — Z125 Encounter for screening for malignant neoplasm of prostate: Secondary | ICD-10-CM | POA: Diagnosis not present

## 2020-02-23 DIAGNOSIS — M9903 Segmental and somatic dysfunction of lumbar region: Secondary | ICD-10-CM | POA: Diagnosis not present

## 2020-02-23 DIAGNOSIS — M47812 Spondylosis without myelopathy or radiculopathy, cervical region: Secondary | ICD-10-CM | POA: Diagnosis not present

## 2020-02-23 DIAGNOSIS — S39012A Strain of muscle, fascia and tendon of lower back, initial encounter: Secondary | ICD-10-CM | POA: Diagnosis not present

## 2020-02-23 DIAGNOSIS — M9901 Segmental and somatic dysfunction of cervical region: Secondary | ICD-10-CM | POA: Diagnosis not present

## 2020-02-23 DIAGNOSIS — M9902 Segmental and somatic dysfunction of thoracic region: Secondary | ICD-10-CM | POA: Diagnosis not present

## 2020-02-23 DIAGNOSIS — M47814 Spondylosis without myelopathy or radiculopathy, thoracic region: Secondary | ICD-10-CM | POA: Diagnosis not present

## 2020-02-29 DIAGNOSIS — M47814 Spondylosis without myelopathy or radiculopathy, thoracic region: Secondary | ICD-10-CM | POA: Diagnosis not present

## 2020-02-29 DIAGNOSIS — M9902 Segmental and somatic dysfunction of thoracic region: Secondary | ICD-10-CM | POA: Diagnosis not present

## 2020-02-29 DIAGNOSIS — M9903 Segmental and somatic dysfunction of lumbar region: Secondary | ICD-10-CM | POA: Diagnosis not present

## 2020-02-29 DIAGNOSIS — M47812 Spondylosis without myelopathy or radiculopathy, cervical region: Secondary | ICD-10-CM | POA: Diagnosis not present

## 2020-02-29 DIAGNOSIS — M9901 Segmental and somatic dysfunction of cervical region: Secondary | ICD-10-CM | POA: Diagnosis not present

## 2020-02-29 DIAGNOSIS — S39012A Strain of muscle, fascia and tendon of lower back, initial encounter: Secondary | ICD-10-CM | POA: Diagnosis not present

## 2020-03-06 DIAGNOSIS — M9901 Segmental and somatic dysfunction of cervical region: Secondary | ICD-10-CM | POA: Diagnosis not present

## 2020-03-06 DIAGNOSIS — M47814 Spondylosis without myelopathy or radiculopathy, thoracic region: Secondary | ICD-10-CM | POA: Diagnosis not present

## 2020-03-06 DIAGNOSIS — M9902 Segmental and somatic dysfunction of thoracic region: Secondary | ICD-10-CM | POA: Diagnosis not present

## 2020-03-06 DIAGNOSIS — S39012A Strain of muscle, fascia and tendon of lower back, initial encounter: Secondary | ICD-10-CM | POA: Diagnosis not present

## 2020-03-06 DIAGNOSIS — M47812 Spondylosis without myelopathy or radiculopathy, cervical region: Secondary | ICD-10-CM | POA: Diagnosis not present

## 2020-03-06 DIAGNOSIS — M9903 Segmental and somatic dysfunction of lumbar region: Secondary | ICD-10-CM | POA: Diagnosis not present

## 2020-03-13 DIAGNOSIS — M9903 Segmental and somatic dysfunction of lumbar region: Secondary | ICD-10-CM | POA: Diagnosis not present

## 2020-03-13 DIAGNOSIS — M47814 Spondylosis without myelopathy or radiculopathy, thoracic region: Secondary | ICD-10-CM | POA: Diagnosis not present

## 2020-03-13 DIAGNOSIS — M47812 Spondylosis without myelopathy or radiculopathy, cervical region: Secondary | ICD-10-CM | POA: Diagnosis not present

## 2020-03-13 DIAGNOSIS — S39012A Strain of muscle, fascia and tendon of lower back, initial encounter: Secondary | ICD-10-CM | POA: Diagnosis not present

## 2020-03-13 DIAGNOSIS — M9902 Segmental and somatic dysfunction of thoracic region: Secondary | ICD-10-CM | POA: Diagnosis not present

## 2020-03-13 DIAGNOSIS — M9901 Segmental and somatic dysfunction of cervical region: Secondary | ICD-10-CM | POA: Diagnosis not present

## 2020-03-27 DIAGNOSIS — M9902 Segmental and somatic dysfunction of thoracic region: Secondary | ICD-10-CM | POA: Diagnosis not present

## 2020-03-27 DIAGNOSIS — M47812 Spondylosis without myelopathy or radiculopathy, cervical region: Secondary | ICD-10-CM | POA: Diagnosis not present

## 2020-03-27 DIAGNOSIS — M47814 Spondylosis without myelopathy or radiculopathy, thoracic region: Secondary | ICD-10-CM | POA: Diagnosis not present

## 2020-03-27 DIAGNOSIS — M9903 Segmental and somatic dysfunction of lumbar region: Secondary | ICD-10-CM | POA: Diagnosis not present

## 2020-03-27 DIAGNOSIS — S39012A Strain of muscle, fascia and tendon of lower back, initial encounter: Secondary | ICD-10-CM | POA: Diagnosis not present

## 2020-03-27 DIAGNOSIS — M9901 Segmental and somatic dysfunction of cervical region: Secondary | ICD-10-CM | POA: Diagnosis not present

## 2020-04-10 DIAGNOSIS — M47812 Spondylosis without myelopathy or radiculopathy, cervical region: Secondary | ICD-10-CM | POA: Diagnosis not present

## 2020-04-10 DIAGNOSIS — S39012A Strain of muscle, fascia and tendon of lower back, initial encounter: Secondary | ICD-10-CM | POA: Diagnosis not present

## 2020-04-10 DIAGNOSIS — M9901 Segmental and somatic dysfunction of cervical region: Secondary | ICD-10-CM | POA: Diagnosis not present

## 2020-04-10 DIAGNOSIS — M47814 Spondylosis without myelopathy or radiculopathy, thoracic region: Secondary | ICD-10-CM | POA: Diagnosis not present

## 2020-04-10 DIAGNOSIS — M9902 Segmental and somatic dysfunction of thoracic region: Secondary | ICD-10-CM | POA: Diagnosis not present

## 2020-04-10 DIAGNOSIS — M9903 Segmental and somatic dysfunction of lumbar region: Secondary | ICD-10-CM | POA: Diagnosis not present

## 2020-07-17 DIAGNOSIS — S39012A Strain of muscle, fascia and tendon of lower back, initial encounter: Secondary | ICD-10-CM | POA: Diagnosis not present

## 2020-07-17 DIAGNOSIS — M9902 Segmental and somatic dysfunction of thoracic region: Secondary | ICD-10-CM | POA: Diagnosis not present

## 2020-07-17 DIAGNOSIS — M9901 Segmental and somatic dysfunction of cervical region: Secondary | ICD-10-CM | POA: Diagnosis not present

## 2020-07-17 DIAGNOSIS — M47812 Spondylosis without myelopathy or radiculopathy, cervical region: Secondary | ICD-10-CM | POA: Diagnosis not present

## 2020-07-17 DIAGNOSIS — M9903 Segmental and somatic dysfunction of lumbar region: Secondary | ICD-10-CM | POA: Diagnosis not present

## 2020-07-17 DIAGNOSIS — M47814 Spondylosis without myelopathy or radiculopathy, thoracic region: Secondary | ICD-10-CM | POA: Diagnosis not present

## 2020-08-07 DIAGNOSIS — M9902 Segmental and somatic dysfunction of thoracic region: Secondary | ICD-10-CM | POA: Diagnosis not present

## 2020-08-07 DIAGNOSIS — M47814 Spondylosis without myelopathy or radiculopathy, thoracic region: Secondary | ICD-10-CM | POA: Diagnosis not present

## 2020-08-07 DIAGNOSIS — M9903 Segmental and somatic dysfunction of lumbar region: Secondary | ICD-10-CM | POA: Diagnosis not present

## 2020-08-07 DIAGNOSIS — M47812 Spondylosis without myelopathy or radiculopathy, cervical region: Secondary | ICD-10-CM | POA: Diagnosis not present

## 2020-08-07 DIAGNOSIS — S39012A Strain of muscle, fascia and tendon of lower back, initial encounter: Secondary | ICD-10-CM | POA: Diagnosis not present

## 2020-08-07 DIAGNOSIS — M9901 Segmental and somatic dysfunction of cervical region: Secondary | ICD-10-CM | POA: Diagnosis not present

## 2020-08-15 DIAGNOSIS — Z20822 Contact with and (suspected) exposure to covid-19: Secondary | ICD-10-CM | POA: Diagnosis not present

## 2020-08-21 DIAGNOSIS — Z23 Encounter for immunization: Secondary | ICD-10-CM | POA: Diagnosis not present

## 2020-10-31 ENCOUNTER — Encounter: Payer: Self-pay | Admitting: Family Medicine

## 2020-10-31 ENCOUNTER — Ambulatory Visit (INDEPENDENT_AMBULATORY_CARE_PROVIDER_SITE_OTHER): Payer: Medicare Other | Admitting: Family Medicine

## 2020-10-31 ENCOUNTER — Other Ambulatory Visit: Payer: Self-pay

## 2020-10-31 ENCOUNTER — Ambulatory Visit: Payer: Self-pay

## 2020-10-31 VITALS — BP 142/84 | HR 81 | Ht 70.0 in | Wt 161.0 lb

## 2020-10-31 DIAGNOSIS — M25511 Pain in right shoulder: Secondary | ICD-10-CM | POA: Diagnosis not present

## 2020-10-31 DIAGNOSIS — M79A22 Nontraumatic compartment syndrome of left lower extremity: Secondary | ICD-10-CM | POA: Diagnosis not present

## 2020-10-31 DIAGNOSIS — M5030 Other cervical disc degeneration, unspecified cervical region: Secondary | ICD-10-CM | POA: Insufficient documentation

## 2020-10-31 MED ORDER — GABAPENTIN 100 MG PO CAPS: 200.0000 mg | ORAL_CAPSULE | Freq: Every day | ORAL | 0 refills | Status: DC

## 2020-10-31 NOTE — Patient Instructions (Signed)
Ac arthritis of left shoulder Pennsaid 2x a day Gabapentin 200mg  at night No heavy lifting overhead other than swimming keep hands in peripheral vision See me in 2-3 months and can consider injections at that time

## 2020-10-31 NOTE — Assessment & Plan Note (Addendum)
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  Discussed with patient in great length.  We will try conservative therapy including home exercises, icing regimen, which activities to do which wants to avoid.  Patient has good strength noted of the shoulder.  Some mild labral pathology could also be playing a role.  Patient also has signs and symptoms consistent with some cervical radiculopathy.  Patient will start on gabapentin at a low dose.  Patient will follow up again in 2 to 3 months and at that time we will further evaluate.  Likely will need possible injections to help him with his raceseason.

## 2020-10-31 NOTE — Assessment & Plan Note (Signed)
Have significant loss of lordosis.  Patient does have pain overall.  Discussed icing regimen and home exercises.  Patient is to increase activity slowly.  Patient is going to start on gabapentin.  Worsening pain can always consider the possibility of advanced imaging.  Likely no true radicular symptoms at this time

## 2020-10-31 NOTE — Assessment & Plan Note (Signed)
Stable at the moment that seems to be more secondary to varicose vein.  Discussed posture and ergonomics.  Increase activity slowly.  Follow-up again 2 to 3 months

## 2020-10-31 NOTE — Progress Notes (Signed)
Paul [name] Paul [name] Phone: [phone] Subjective:   Paul [name], am serving as a scribe for Dr. Hulan Saas. This visit occurred during the SARS-CoV-2 public health emergency.  Safety protocols were in place, including screening questions prior to the visit, additional usage of staff PPE, and extensive cleaning of exam room while observing appropriate contact time as indicated for disinfecting solutions.   I'm seeing this patient by the request  of:  Shirline Frees, MD  CC: right shoulder pain follow up   [id number]  Paul [name] is a 69 y.o. male coming in with complaint of right shoulder pain. 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 Patient states that he has been having nagging shoulder pain in anterior aspect. Notices pain increases with swim portion of triathlon. Pain does radiate up into the neck. Always has pain in cervical spine with cycling.   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 Feels nerve pain in the lateral gastroc in same leg. After a long run pain will be in calf for a few days.   Qualified for world championship in [place]. Next race is April 2022 with major tri races beginning in June 2022. Took this year off from any major competitions. Did run [facility] and has been training. Would like to try to decrease pain in these two areas so that they do not manifest into a larger problem as he works his way to these races.    CT Cervical 2019  IMPRESSION: Head CT: Normal.  Cervical spine CT: [name] traumatic finding. Degenerative changes as above, with bony foraminal narrowing on the right at C4-5.  Left pneumothorax, described at chest CT.  Past Medical History:  Diagnosis Date  . Depression    RESOLVED  . Hyperlipidemia   . Spinal stenosis    WAS HAVING NECK PAIN -PT STATES NERVE ABLATION PROCEDURE 2012 AND PAIN HAS RESOLVED  .  Spondylitis [facility])    Past Surgical History:  Procedure Laterality Date  . APPENDECTOMY  1962  . HERNIA REPAIR    . INSERTION OF MESH  10/06/2012   Procedure: INSERTION OF MESH;  Surgeon: Imogene Burn. Georgette Dover, MD;  Location: [facility];  Service: General;  Laterality: N/A;  . [place]   left  . TONSILLECTOMY  1968  . UMBILICAL HERNIA REPAIR  10/06/2012   Procedure: HERNIA REPAIR UMBILICAL ADULT;  Surgeon: Imogene Burn. Georgette Dover, MD;  Location: [facility];  Service: General;  Laterality: N/A;   Social History   Socioeconomic History  . Marital status: Married    Spouse name: Not on file  . Number of children: Not on file  . Years of education: Not on file  . Highest education level: Not on file  Occupational History  . Not on file  Tobacco Use  . Smoking status: Never Smoker  . Smokeless tobacco: Never Used  Vaping Use  . Vaping Use: Never used  Substance and Sexual Activity  . Alcohol use: Yes    Comment: OCCAS ALCOHOL  . Drug use: [name]  . Sexual activity: Not on file  Other Topics Concern  . Not on file  Social History Narrative   ** Merged History Encounter **       Social Determinants of Health   Financial Resource Strain: Not on file  Food Insecurity: Not on file  Transportation Needs: Not on file  Physical Activity: Not on  file  Stress: Not on file  Social Connections: Not on file   Allergies  Allergen Reactions  . Crestor [Rosuvastatin]     MUSCLE CRAMPS  . Lipitor [Atorvastatin]     Liver toxicity   Family History  Problem Relation Age of Onset  . Stroke Mother   . Heart disease Father   . Cancer Sister        bone     Current Outpatient Medications (Cardiovascular):  .  rosuvastatin (CRESTOR) 20 MG tablet, Take 1 tablet (20 mg total) by mouth daily.  Current Outpatient Medications (Respiratory):  .  albuterol (VENTOLIN HFA) 108 (90 Base) MCG/ACT inhaler, Inhale 2 puffs into the lungs every 4 (four) hours as needed for wheezing or shortness of  breath.  Current Outpatient Medications (Analgesics):  .  aspirin EC 81 MG tablet, Take 1 tablet (81 mg total) by mouth daily.  Current Outpatient Medications (Hematological):  .  ferrous sulfate 325 (65 FE) MG EC tablet, Take 65 mg by mouth daily. .  folic acid (FOLVITE) 1 MG tablet, Take 800 mcg by mouth daily. .  vitamin B-12 (CYANOCOBALAMIN) 1000 MCG tablet, Take 1,000 mcg by mouth daily.  Current Outpatient Medications (Other):  .  calcium-vitamin D (OSCAL WITH D) 500-200 MG-UNIT tablet, Take 1 tablet by mouth daily with breakfast. .  cholecalciferol (VITAMIN D3) 10 MCG (400 UNIT) TABS tablet, Take 5,000 Units by mouth daily. [name] Kitchen  co-enzyme Q-10 30 MG capsule, Take 200 mg by mouth daily. .  fish oil-omega-3 fatty acids 1000 MG capsule, Take 2 g by mouth daily.  .  L-ARGININE-500 PO, Take 400 mg by mouth. Daily .  Multiple Vitamins-Minerals (CULTURELLE PROBIOTICS + MULTIV PO), Take by mouth daily. .  Quercetin 250 MG TABS, Take 500 mg by mouth daily. .  vitamin A 10000 UNIT capsule, Take 3,000 Units by mouth daily. .  vitamin C (ASCORBIC ACID) 500 MG tablet, Take 500 mg by mouth daily. [name] Kitchen  gabapentin (NEURONTIN) 100 MG capsule, Take 2 capsules (200 mg total) by mouth at bedtime.   Reviewed prior external information including notes and imaging from  primary care provider As well as notes that were available from care everywhere and other healthcare systems.  Past medical history, social, surgical and family history all reviewed in electronic medical record.  [name] pertanent information unless stated regarding to the chief complaint.   Review of Systems:  [name] headache, visual changes, nausea, vomiting, diarrhea, constipation, dizziness, abdominal pain, skin rash, fevers, chills, night sweats, weight loss, swollen lymph nodes, body aches, joint swelling, chest pain, shortness of breath, mood changes. POSITIVE muscle aches  Objective  Blood pressure (!) 142/84, pulse 81, height 5\' 10"   (1.778 m), weight 161 lb (73 kg), SpO2 98 %.   General: [name] apparent distress alert and oriented x3 mood and affect normal, dressed appropriately.  HEENT: Pupils equal, extraocular movements intact  Respiratory: Patient's speak in full sentences and does not appear short of breath  Cardiovascular: [name] lower extremity edema, non tender, [name] erythema   MSK:   Right shoulder on inspection does have some arthritic changes noted of the acromioclavicular joint.  Positive crossover test.  Mild pain with [place].  Negative empty can.  Limited musculoskeletal ultrasound was performed and interpreted by [name]  Limited ultrasound of patient's right shoulder shows the patient has a very small bursitis noted in the subacromial space but rotator cuff has some degenerative changes but [name] acute tear is appreciated.  [name] retraction  noted.  Bicep tendon is unremarkable.  Moderate to severe arthritic changes of the acromioclavicular joint with effusion noted. 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  This included exercises focusing on stretching, strengthening, with significant focus on eccentric aspects.   Long term goals include an improvement in range of motion, strength, endurance as well as avoiding reinjury. Patient's frequency would include in 1-2 times a day, 3-5 times a week for a duration of 6-12 weeks.  Shoulder Exercises that included:  Basic scapular stabilization to include adduction and depression of scapula Scaption, focusing on proper movement and good control Internal and External rotation utilizing a theraband, with elbow tucked at side entire time Rows with theraband  Proper technique shown and discussed handout in great detail with ATC.  All questions were discussed and answered.     Impression and Recommendations:     The above documentation has been reviewed  and is accurate and complete [name], DO

## 2020-11-01 ENCOUNTER — Encounter: Payer: Self-pay | Admitting: Family Medicine

## 2020-11-07 DIAGNOSIS — Z20822 Contact with and (suspected) exposure to covid-19: Secondary | ICD-10-CM | POA: Diagnosis not present

## 2020-11-23 DIAGNOSIS — M6740 Ganglion, unspecified site: Secondary | ICD-10-CM | POA: Diagnosis not present

## 2020-12-26 ENCOUNTER — Other Ambulatory Visit: Payer: Self-pay | Admitting: Family Medicine

## 2020-12-26 MED ORDER — GABAPENTIN 100 MG PO CAPS: 200.0000 mg | ORAL_CAPSULE | Freq: Every day | ORAL | 0 refills | Status: DC

## 2021-01-01 ENCOUNTER — Encounter: Payer: Self-pay | Admitting: Family Medicine

## 2021-01-02 ENCOUNTER — Other Ambulatory Visit: Payer: Self-pay

## 2021-01-02 MED ORDER — GABAPENTIN 100 MG PO CAPS: 200.0000 mg | ORAL_CAPSULE | Freq: Three times a day (TID) | ORAL | 0 refills | Status: DC

## 2021-01-22 ENCOUNTER — Ambulatory Visit (INDEPENDENT_AMBULATORY_CARE_PROVIDER_SITE_OTHER): Payer: Medicare Other | Admitting: Family Medicine

## 2021-01-22 ENCOUNTER — Ambulatory Visit: Payer: Self-pay

## 2021-01-22 ENCOUNTER — Other Ambulatory Visit: Payer: Self-pay

## 2021-01-22 ENCOUNTER — Encounter: Payer: Self-pay | Admitting: Family Medicine

## 2021-01-22 VITALS — BP 138/82 | HR 49 | Ht 70.0 in | Wt 162.0 lb

## 2021-01-22 DIAGNOSIS — M7122 Synovial cyst of popliteal space [Baker], left knee: Secondary | ICD-10-CM | POA: Diagnosis not present

## 2021-01-22 DIAGNOSIS — G8929 Other chronic pain: Secondary | ICD-10-CM | POA: Diagnosis not present

## 2021-01-22 DIAGNOSIS — M79662 Pain in left lower leg: Secondary | ICD-10-CM | POA: Diagnosis not present

## 2021-01-22 DIAGNOSIS — M25562 Pain in left knee: Secondary | ICD-10-CM

## 2021-01-22 MED ORDER — NITROGLYCERIN 0.1 MG/HR TD PT24: MEDICATED_PATCH | TRANSDERMAL | 0 refills | Status: DC

## 2021-01-22 NOTE — Patient Instructions (Signed)
Good to see you Nitroglycerin Protocol   Apply 1/4 nitroglycerin patch to affected area daily.  Change position of patch within the affected area every 24 hours.  You may experience a headache during the first 1-2 weeks of using the patch, these should subside.  If you experience headaches after beginning nitroglycerin patch treatment, you may take your preferred over the counter pain reliever.  Another side effect of the nitroglycerin patch is skin irritation or rash related to patch adhesive.  Please notify our office if you develop more severe headaches or rash, and stop the patch.  Tendon healing with nitroglycerin patch may require 12 to 24 weeks depending on the extent of injury.  Men should not use if taking Viagra, Cialis, or Levitra.   Do not use if you have migraines or rosacea. Compression with running Improvement in hydration Spenco orthotics "total support" See me again as scheduled

## 2021-01-22 NOTE — Progress Notes (Signed)
[facility] [place] Phone: [phone] Subjective:   Paul [name], am serving as a scribe for Dr. Hulan Saas. This visit occurred during the SARS-CoV-2 public health emergency.  Safety protocols were in place, including screening questions prior to the visit, additional usage of staff PPE, and extensive cleaning of exam room while observing appropriate contact time as indicated for disinfecting solutions.   I'm seeing this patient by the request  of:  Shirline Frees, MD  CC: Left leg pain  [id number]  Paul [name] is a 70 y.o. male coming in with complaint of lower leg pain. Seen for exertional compartment syndrome on 10/31/2020. Patient is training for a race on 3/20. States that for past month he has had pain in left lower leg. Unable to sleep due to pain. Patient states I've been limping for the first 5-10 minutes on my runs, then running okay, then it tightens up even after stretching.  It now impacts my swimming - it feels like something is pinching behind my knee as I kick.  After warming up on the bike I seem to be okay but it tightens up post-ride.  Patient states he is having pain in distal lateral hamstring and proximal lateral calf tendons. Had massage one month ago which helped. Woke up last night at 3am in pain. Rode the other day. Was hobbling before the ride but after warming up he was able to ride 50 miles without pain. Once he warms up for his runs he is ok to run with out pain as well. Patient feels his power has been affected by pain in knee.   Continues to use 300mg  gabapentin for his neck and right shoulder pain. States that shoulder is better. He is able to manage neck pain with 300mg  of gabapentin vs 200mg . States that now he is feeling pain in posterior aspect of left shoulder.       Past Medical History:  Diagnosis Date  . Depression    RESOLVED  . Hyperlipidemia   . Spinal stenosis     WAS HAVING NECK PAIN -PT STATES NERVE ABLATION PROCEDURE 2012 AND PAIN HAS RESOLVED  . Spondylitis [facility])    Past Surgical History:  Procedure Laterality Date  . APPENDECTOMY  1962  . HERNIA REPAIR    . INSERTION OF MESH  10/06/2012   Procedure: INSERTION OF MESH;  Surgeon: Imogene Burn. Georgette Dover, MD;  Location: [facility];  Service: General;  Laterality: N/A;  . [place]   left  . TONSILLECTOMY  1968  . UMBILICAL HERNIA REPAIR  10/06/2012   Procedure: HERNIA REPAIR UMBILICAL ADULT;  Surgeon: Imogene Burn. Georgette Dover, MD;  Location: [facility];  Service: General;  Laterality: N/A;   Social History   Socioeconomic History  . Marital status: Married    Spouse name: Not on file  . Number of children: Not on file  . Years of education: Not on file  . Highest education level: Not on file  Occupational History  . Not on file  Tobacco Use  . Smoking status: Never Smoker  . Smokeless tobacco: Never Used  Vaping Use  . Vaping Use: Never used  Substance and Sexual Activity  . Alcohol use: Yes    Comment: OCCAS ALCOHOL  . Drug use: [name]  . Sexual activity: Not on file  Other Topics Concern  . Not on file  Social History Narrative   ** Merged History Encounter **  Social Determinants of Health   Financial Resource Strain: Not on file  Food Insecurity: Not on file  Transportation Needs: Not on file  Physical Activity: Not on file  Stress: Not on file  Social Connections: Not on file   Allergies  Allergen Reactions  . Crestor [Rosuvastatin]     MUSCLE CRAMPS  . Lipitor [Atorvastatin]     Liver toxicity   Family History  Problem Relation Age of Onset  . Stroke Mother   . Heart disease Father   . Cancer Sister        bone     Current Outpatient Medications (Cardiovascular):  .  nitroGLYCERIN (NITRO-DUR) 0.1 mg/hr patch, 1/4 patch daily to affected area .  rosuvastatin (CRESTOR) 20 MG tablet, Take 1 tablet (20 mg total) by mouth daily.  Current Outpatient Medications  (Respiratory):  .  albuterol (VENTOLIN HFA) 108 (90 Base) MCG/ACT inhaler, Inhale 2 puffs into the lungs every 4 (four) hours as needed for wheezing or shortness of breath.  Current Outpatient Medications (Analgesics):  .  aspirin EC 81 MG tablet, Take 1 tablet (81 mg total) by mouth daily.  Current Outpatient Medications (Hematological):  .  ferrous sulfate 325 (65 FE) MG EC tablet, Take 65 mg by mouth daily. .  folic acid (FOLVITE) 1 MG tablet, Take 800 mcg by mouth daily. .  vitamin B-12 (CYANOCOBALAMIN) 1000 MCG tablet, Take 1,000 mcg by mouth daily.  Current Outpatient Medications (Other):  .  calcium-vitamin D (OSCAL WITH D) 500-200 MG-UNIT tablet, Take 1 tablet by mouth daily with breakfast. .  cholecalciferol (VITAMIN D3) 10 MCG (400 UNIT) TABS tablet, Take 5,000 Units by mouth daily. [name] Kitchen  co-enzyme Q-10 30 MG capsule, Take 200 mg by mouth daily. .  fish oil-omega-3 fatty acids 1000 MG capsule, Take 2 g by mouth daily.  [name] Kitchen  gabapentin (NEURONTIN) 100 MG capsule, Take 2 capsules (200 mg total) by mouth at bedtime. .  gabapentin (NEURONTIN) 100 MG capsule, Take 2 capsules (200 mg total) by mouth 3 (three) times daily. .  L-ARGININE-500 PO, Take 400 mg by mouth. Daily .  Multiple Vitamins-Minerals (CULTURELLE PROBIOTICS + MULTIV PO), Take by mouth daily. .  Quercetin 250 MG TABS, Take 500 mg by mouth daily. .  vitamin A 10000 UNIT capsule, Take 3,000 Units by mouth daily. .  vitamin C (ASCORBIC ACID) 500 MG tablet, Take 500 mg by mouth daily.   Reviewed prior external information including notes and imaging from  primary care provider As well as notes that were available from care everywhere and other healthcare systems.  Past medical history, social, surgical and family history all reviewed in electronic medical record.  [name] pertanent information unless stated regarding to the chief complaint.   Review of Systems:  [name] headache, visual changes, nausea, vomiting, diarrhea,  constipation, dizziness, abdominal pain, skin rash, fevers, chills, night sweats, weight loss, swollen lymph nodes, body aches, joint swelling, chest pain, shortness of breath, mood changes. POSITIVE muscle aches  Objective  Blood pressure 138/82, pulse (!) 49, height 5\' 10"  (1.778 m), weight 162 lb (73.5 kg), SpO2 98 %.   General: [name] apparent distress alert and oriented x3 mood and affect normal, dressed appropriately.  HEENT: Pupils equal, extraocular movements intact  Respiratory: Patient's speak in full sentences and does not appear short of breath  Cardiovascular: [name] lower extremity edema, non tender, [name] erythema  Gait normal with good balance and coordination.  MSK: Left knee does have some mild effusion noted.  Lacks last 5 degrees of flexion.  Patient does have crepitus with range of motion.  Tenderness in the lateral gastroc head noted.  [name] swelling of the lower extremities are noted.  Patient is able to go on his toes with [name] significant difficulty.  Limited musculoskeletal ultrasound was performed and interpreted by [name]  Limited ultrasound shows that patient does still have the area where there is scar tissue in the lateral gastroc head.  This does go near the varicose vein that could be potentially having some compression.  Patient also does have a knee effusion noted today mostly of the patellofemoral joint.    Impression and Recommendations:     The above documentation has been reviewed and is accurate and complete [name], DO

## 2021-01-22 NOTE — Assessment & Plan Note (Signed)
Knee swelling noted again.  We will monitor.  May need aspiration and follow-up

## 2021-01-22 NOTE — Assessment & Plan Note (Signed)
Patient is having pain in the calf again.  Seems to be more lateral.  The same problem he has had previously which is more of an exertional compartment syndrome.  Patient did have swelling noted of the left knee but patient declined injection but I do think that that could be possible beneficial as well.  Discussed the potential for x-rays of the knee as well.  Patient will try the compression, restart the nitroglycerin at a lower dose and hopefully this will be beneficial.  Patient will continue the gabapentin if it is helping his other problems.  Follow-up with me again in 4 weeks

## 2021-02-06 NOTE — Progress Notes (Signed)
[place] Phone: [phone] Subjective:   Paul [name], am serving as a scribe for Dr. Hulan Saas. This visit occurred during the SARS-CoV-2 public health emergency.  Safety protocols were in place, including screening questions prior to the visit, additional usage of staff PPE, and extensive cleaning of exam room while observing appropriate contact time as indicated for disinfecting solutions.   I'm seeing this patient by the request  of:  Shirline Frees, MD  CC: Left knee pain  [id number]   01/22/2021 Patient is having pain in the calf again.  Seems to be more lateral.  The same problem he has had previously which is more of an exertional compartment syndrome.  Patient did have swelling noted of the left knee but patient declined injection but I do think that that could be possible beneficial as well.  Discussed the potential for x-rays of the knee as well.  Patient will try the compression, restart the nitroglycerin at a lower dose and hopefully this will be beneficial.  Patient will continue the gabapentin if it is helping his other problems.  Follow-up with me again in 4 weeks  Update 02/07/2021 Paul [name] is a 70 y.o. male coming in with complaint of left calf pain. Patient has felt little improvement. Has had dry needling which helped release the lateral compartment musculature of lower leg. Is able to race but has to warm up more than usual to get pain to go away. Using Volteren gel and IBU. Also having pain in left foot over tibialis anterior when he is sleeping.   Did have some R glute pain after the 1/2 marathon on Sunday. Unsure if pain in L knee threw off hips. Pain has subsided.  Patient would also like to speak about gastric emptying prior to his races.  Patient states that he usually has to use the bathroom at the 12 mile mark.  Wanting to know if there is anything else he can potentially  do.      Past Medical History:  Diagnosis Date  . Depression    RESOLVED  . Hyperlipidemia   . Spinal stenosis    WAS HAVING NECK PAIN -PT STATES NERVE ABLATION PROCEDURE 2012 AND PAIN HAS RESOLVED  . Spondylitis [facility])    Past Surgical History:  Procedure Laterality Date  . APPENDECTOMY  1962  . HERNIA REPAIR    . INSERTION OF MESH  10/06/2012   Procedure: INSERTION OF MESH;  Surgeon: Imogene Burn. Georgette Dover, MD;  Location: [facility];  Service: General;  Laterality: N/A;  . [place]   left  . TONSILLECTOMY  1968  . UMBILICAL HERNIA REPAIR  10/06/2012   Procedure: HERNIA REPAIR UMBILICAL ADULT;  Surgeon: Imogene Burn. Georgette Dover, MD;  Location: [facility];  Service: General;  Laterality: N/A;   Social History   Socioeconomic History  . Marital status: Married    Spouse name: Not on file  . Number of children: Not on file  . Years of education: Not on file  . Highest education level: Not on file  Occupational History  . Not on file  Tobacco Use  . Smoking status: Never Smoker  . Smokeless tobacco: Never Used  Vaping Use  . Vaping Use: Never used  Substance and Sexual Activity  . Alcohol use: Yes    Comment: OCCAS ALCOHOL  . Drug use: [name]  . Sexual activity: Not on file  Other Topics  Concern  . Not on file  Social History Narrative   ** Merged History Encounter **       Social Determinants of Health   Financial Resource Strain: Not on file  Food Insecurity: Not on file  Transportation Needs: Not on file  Physical Activity: Not on file  Stress: Not on file  Social Connections: Not on file   Allergies  Allergen Reactions  . Crestor [Rosuvastatin]     MUSCLE CRAMPS  . Lipitor [Atorvastatin]     Liver toxicity   Family History  Problem Relation Age of Onset  . Stroke Mother   . Heart disease Father   . Cancer Sister        bone     Current Outpatient Medications (Cardiovascular):  .  nitroGLYCERIN (NITRO-DUR) 0.1 mg/hr patch, 1/4 patch daily to  affected area .  rosuvastatin (CRESTOR) 20 MG tablet, Take 1 tablet (20 mg total) by mouth daily.  Current Outpatient Medications (Respiratory):  .  albuterol (VENTOLIN HFA) 108 (90 Base) MCG/ACT inhaler, Inhale 2 puffs into the lungs every 4 (four) hours as needed for wheezing or shortness of breath.   Current Outpatient Medications (Hematological):  .  ferrous sulfate 325 (65 FE) MG EC tablet, Take 65 mg by mouth daily. .  folic acid (FOLVITE) 1 MG tablet, Take 800 mcg by mouth daily. .  vitamin B-12 (CYANOCOBALAMIN) 1000 MCG tablet, Take 1,000 mcg by mouth daily.  Current Outpatient Medications (Other):  .  calcium-vitamin D (OSCAL WITH D) 500-200 MG-UNIT tablet, Take 1 tablet by mouth daily with breakfast. .  cholecalciferol (VITAMIN D3) 10 MCG (400 UNIT) TABS tablet, Take 5,000 Units by mouth daily. [name] Kitchen  co-enzyme Q-10 30 MG capsule, Take 200 mg by mouth daily. .  fish oil-omega-3 fatty acids 1000 MG capsule, Take 2 g by mouth daily.  [name] Kitchen  gabapentin (NEURONTIN) 100 MG capsule, Take 2 capsules (200 mg total) by mouth at bedtime. .  gabapentin (NEURONTIN) 100 MG capsule, Take 2 capsules (200 mg total) by mouth 3 (three) times daily. .  L-ARGININE-500 PO, Take 400 mg by mouth. Daily .  Multiple Vitamins-Minerals (CULTURELLE PROBIOTICS + MULTIV PO), Take by mouth daily. .  Quercetin 250 MG TABS, Take 500 mg by mouth daily. .  vitamin A 10000 UNIT capsule, Take 3,000 Units by mouth daily. .  vitamin C (ASCORBIC ACID) 500 MG tablet, Take 500 mg by mouth daily.   Reviewed prior external information including notes and imaging from  primary care provider As well as notes that were available from care everywhere and other healthcare systems.  Past medical history, social, surgical and family history all reviewed in electronic medical record.  [name] pertanent information unless stated regarding to the chief complaint.   Review of Systems:  [name] headache, visual changes, nausea, vomiting,  diarrhea, constipation, dizziness, abdominal pain, skin rash, fevers, chills, night sweats, weight loss, swollen lymph nodes, body aches, joint swelling, chest pain, shortness of breath, mood changes. POSITIVE muscle aches  Objective  Blood pressure (!) 142/90, pulse 65, height 5\' 10"  (1.778 m), weight 160 lb (72.6 kg), SpO2 97 %.   General: [name] apparent distress alert and oriented x3 mood and affect normal, dressed appropriately.  HEENT: Pupils equal, extraocular movements intact  Respiratory: Patient's speak in full sentences and does not appear short of breath  Cardiovascular: [name] lower extremity edema, non tender, [name] erythema  Gait mild antalgic gait. Patient is having difficulty with full stride at the moment.  Tender  to palpation more of the lateral aspect of the knee.  Mild lateral tracking of the patella.  Trace effusion of the knee noted.  Limited musculoskeletal ultrasound was performed and interpreted by [name]  Limited ultrasound of patient's left knee shows that patient does have effusion noted of the patellofemoral joint lateral meniscus does have some chronic changes noted.  Questionable parameniscal cyst noted. 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 Left knee was prepped with alcohol swab and utilizing anterolateral approach, patient's left knee space was injected with 4:1  marcaine 0.5%: Kenalog 40mg /dL. Patient tolerated the procedure well without immediate complications.   Impression and Recommendations:     The above documentation has been reviewed and is accurate and complete [name], DO

## 2021-02-07 ENCOUNTER — Ambulatory Visit (INDEPENDENT_AMBULATORY_CARE_PROVIDER_SITE_OTHER): Payer: Medicare Other | Admitting: Family Medicine

## 2021-02-07 ENCOUNTER — Other Ambulatory Visit: Payer: Self-pay

## 2021-02-07 ENCOUNTER — Ambulatory Visit (INDEPENDENT_AMBULATORY_CARE_PROVIDER_SITE_OTHER): Payer: Medicare Other

## 2021-02-07 ENCOUNTER — Ambulatory Visit: Payer: Self-pay

## 2021-02-07 ENCOUNTER — Encounter: Payer: Self-pay | Admitting: Family Medicine

## 2021-02-07 VITALS — BP 142/90 | HR 65 | Ht 70.0 in | Wt 160.0 lb

## 2021-02-07 DIAGNOSIS — M1712 Unilateral primary osteoarthritis, left knee: Secondary | ICD-10-CM

## 2021-02-07 DIAGNOSIS — M170 Bilateral primary osteoarthritis of knee: Secondary | ICD-10-CM | POA: Insufficient documentation

## 2021-02-07 DIAGNOSIS — M79662 Pain in left lower leg: Secondary | ICD-10-CM

## 2021-02-07 DIAGNOSIS — M25462 Effusion, left knee: Secondary | ICD-10-CM | POA: Diagnosis not present

## 2021-02-07 NOTE — Assessment & Plan Note (Signed)
PatientPatient is doing relatively well at this time.  Patient has had a Baker's cyst on the right side previously as well.  Likely no significant swelling of that but patient does have what appears to be maybe a perimeniscal cyst noted.  Has had a lateral meniscal tear and did respond well to the PRP.  We will consider that we will start the steroid today.  Discussed which activities to do which wants to avoid.  Discussed icing regimen patient is able to work out.  Follow-up again in 4 weeks

## 2021-02-07 NOTE — Patient Instructions (Addendum)
Xray today Injected knee today Consider Immodium on race day Mirilax 17g daily, Colace 100mg  daily for 3 days afterwards See me again in 4 weeks

## 2021-02-09 DIAGNOSIS — Z20822 Contact with and (suspected) exposure to covid-19: Secondary | ICD-10-CM | POA: Diagnosis not present

## 2021-02-19 DIAGNOSIS — Z0000 Encounter for general adult medical examination without abnormal findings: Secondary | ICD-10-CM | POA: Diagnosis not present

## 2021-02-19 DIAGNOSIS — E559 Vitamin D deficiency, unspecified: Secondary | ICD-10-CM | POA: Diagnosis not present

## 2021-02-19 DIAGNOSIS — D518 Other vitamin B12 deficiency anemias: Secondary | ICD-10-CM | POA: Diagnosis not present

## 2021-02-19 DIAGNOSIS — E7800 Pure hypercholesterolemia, unspecified: Secondary | ICD-10-CM | POA: Diagnosis not present

## 2021-02-19 DIAGNOSIS — I2510 Atherosclerotic heart disease of native coronary artery without angina pectoris: Secondary | ICD-10-CM | POA: Diagnosis not present

## 2021-02-19 DIAGNOSIS — Z125 Encounter for screening for malignant neoplasm of prostate: Secondary | ICD-10-CM | POA: Diagnosis not present

## 2021-02-19 DIAGNOSIS — N528 Other male erectile dysfunction: Secondary | ICD-10-CM | POA: Diagnosis not present

## 2021-02-23 DIAGNOSIS — Z23 Encounter for immunization: Secondary | ICD-10-CM | POA: Diagnosis not present

## 2021-03-06 NOTE — Progress Notes (Signed)
[place] Phone: [phone] Subjective:   [name], am serving as a scribe for Dr. Hulan Saas. This visit occurred during the SARS-CoV-2 public health emergency.  Safety protocols were in place, including screening questions prior to the visit, additional usage of staff PPE, and extensive cleaning of exam room while observing appropriate contact time as indicated for disinfecting solutions.   I'm seeing this patient by the request  of:  Shirline Frees, MD  CC: Knee pain, leg pain follow-up  [id number]   02/07/2021 PatientPatient is doing relatively well at this time.  Patient has had a Baker's cyst on the right side previously as well.  Likely no significant swelling of that but patient does have what appears to be maybe a perimeniscal cyst noted.  Has had a lateral meniscal tear and did respond well to the PRP.  We will consider that we will start the steroid today.  Discussed which activities to do which wants to avoid.  Discussed icing regimen patient is able to work out.  Follow-up again in 4 weeks  Update 03/07/2021 MAIKA KACZMAREK is a 70 y.o. male coming in with complaint of left calf pain. Patient states that his pain has improved. Does feel pain over lateral gastroc at all times as well as lateral ankle. Was swimming and had some pain with kicking.  Patient with very minimal.  Patient feels that he is approximately 70% better at this point.  Patient has been able to do more activity with less pain.     Past Medical History:  Diagnosis Date  . Depression    RESOLVED  . Hyperlipidemia   . Spinal stenosis    WAS HAVING NECK PAIN -PT STATES NERVE ABLATION PROCEDURE 2012 AND PAIN HAS RESOLVED  . Spondylitis [facility])    Past Surgical History:  Procedure Laterality Date  . APPENDECTOMY  1962  . HERNIA REPAIR    . INSERTION OF MESH  10/06/2012   Procedure: INSERTION OF MESH;  Surgeon: Imogene Burn.  Georgette Dover, MD;  [facility];  Service: General;  Laterality: N/A;  . [place]   left  . TONSILLECTOMY  1968  . UMBILICAL HERNIA REPAIR  10/06/2012   Procedure: HERNIA REPAIR UMBILICAL ADULT;  Surgeon: Imogene Burn. Georgette Dover, MD;  [facility];  Service: General;  Laterality: N/A;   Social History   Socioeconomic History  . Marital status: Married    Spouse name: Not on file  . Number of children: Not on file  . Years of education: Not on file  . Highest education level: Not on file  Occupational History  . Not on file  Tobacco Use  . Smoking status: Never Smoker  . Smokeless tobacco: Never Used  Vaping Use  . Vaping Use: Never used  Substance and Sexual Activity  . Alcohol use: Yes    Comment: OCCAS ALCOHOL  . Drug use: No  . Sexual activity: Not on file  Other Topics Concern  . Not on file  Social History Narrative   ** Merged History Encounter **       Social Determinants of Health   Financial Resource Strain: Not on file  Food Insecurity: Not on file  Transportation Needs: Not on file  Physical Activity: Not on file  Stress: Not on file  Social Connections: Not on file   Allergies  Allergen Reactions  . Crestor [Rosuvastatin]     MUSCLE  CRAMPS  . Lipitor [Atorvastatin]     Liver toxicity   Family History  Problem Relation Age of Onset  . Stroke Mother   . Heart disease Father   . Cancer Sister        bone     Current Outpatient Medications (Cardiovascular):  .  nitroGLYCERIN (NITRO-DUR) 0.1 mg/hr patch, 1/4 patch daily to affected area .  rosuvastatin (CRESTOR) 20 MG tablet, Take 1 tablet (20 mg total) by mouth daily.  Current Outpatient Medications (Respiratory):  .  albuterol (VENTOLIN HFA) 108 (90 Base) MCG/ACT inhaler, Inhale 2 puffs into the lungs every 4 (four) hours as needed for wheezing or shortness of breath.   Current Outpatient Medications (Hematological):  .  ferrous sulfate 325 (65 FE) MG EC tablet, Take 65 mg by  mouth daily. .  folic acid (FOLVITE) 1 MG tablet, Take 800 mcg by mouth daily. .  vitamin B-12 (CYANOCOBALAMIN) 1000 MCG tablet, Take 1,000 mcg by mouth daily.  Current Outpatient Medications (Other):  .  calcium-vitamin D (OSCAL WITH D) 500-200 MG-UNIT tablet, Take 1 tablet by mouth daily with breakfast. .  cholecalciferol (VITAMIN D3) 10 MCG (400 UNIT) TABS tablet, Take 5,000 Units by mouth daily. [name] Kitchen  co-enzyme Q-10 30 MG capsule, Take 200 mg by mouth daily. .  fish oil-omega-3 fatty acids 1000 MG capsule, Take 2 g by mouth daily.  [name] Kitchen  gabapentin (NEURONTIN) 100 MG capsule, Take 2 capsules (200 mg total) by mouth at bedtime. .  gabapentin (NEURONTIN) 100 MG capsule, Take 2 capsules (200 mg total) by mouth 3 (three) times daily. .  L-ARGININE-500 PO, Take 400 mg by mouth. Daily .  Multiple Vitamins-Minerals (CULTURELLE PROBIOTICS + MULTIV PO), Take by mouth daily. .  Quercetin 250 MG TABS, Take 500 mg by mouth daily. .  vitamin A 10000 UNIT capsule, Take 3,000 Units by mouth daily. .  vitamin C (ASCORBIC ACID) 500 MG tablet, Take 500 mg by mouth daily.   Reviewed prior external information including notes and imaging from  primary care provider As well as notes that were available from care everywhere and other healthcare systems.  Past medical history, social, surgical and family history all reviewed in electronic medical record.  No pertanent information unless stated regarding to the chief complaint.   Review of Systems:  No headache, visual changes, nausea, vomiting, diarrhea, constipation, dizziness, abdominal pain, skin rash, fevers, chills, night sweats, weight loss, swollen lymph nodes, body aches, joint swelling, chest pain, shortness of breath, mood changes. POSITIVE muscle aches  Objective  Blood pressure 140/80, pulse (!) 57, height 5\' 10"  (1.778 m), weight 157 lb (71.2 kg), SpO2 99 %.   General: No apparent distress alert and oriented x3 mood and affect normal, dressed  appropriately.  HEENT: Pupils equal, extraocular movements intact  Respiratory: Patient's speak in full sentences and does not appear short of breath  Cardiovascular: No lower extremity edema, non tender, no erythema  Gait normal with good balance and coordination.  Patient is running gait does show the patient does have more of a lateral midfoot strike of the left foot with minimal pushoff.  Right foot seems to be unremarkable.  No significant weakness of the hips noted. MSK: Mild arthritic changes noted. Patient's left knee does have arthritic changes.  Still very minimally tender to palpation over the lateral joint.  Mild positive McMurray.  Limited musculoskeletal ultrasound was performed and interpreted by [name]     Limited ultrasound shows the patient does  have a meniscal degenerative changes noted.  No Baker's cyst Noted on the medial aspect of the knee.  Patient does have a very mild varicose vein noted.  Patient's previous cystic formation continues to be significantly smaller than previous exam. Impression and Recommendations:     The above documentation has been reviewed and is accurate and complete [name], DO

## 2021-03-07 ENCOUNTER — Ambulatory Visit: Payer: Self-pay

## 2021-03-07 ENCOUNTER — Other Ambulatory Visit: Payer: Self-pay

## 2021-03-07 ENCOUNTER — Ambulatory Visit (INDEPENDENT_AMBULATORY_CARE_PROVIDER_SITE_OTHER): Payer: Medicare Other | Admitting: Family Medicine

## 2021-03-07 ENCOUNTER — Encounter: Payer: Self-pay | Admitting: Family Medicine

## 2021-03-07 VITALS — BP 140/80 | HR 57 | Ht 70.0 in | Wt 157.0 lb

## 2021-03-07 DIAGNOSIS — M25562 Pain in left knee: Secondary | ICD-10-CM

## 2021-03-07 DIAGNOSIS — G8929 Other chronic pain: Secondary | ICD-10-CM

## 2021-03-07 DIAGNOSIS — M1712 Unilateral primary osteoarthritis, left knee: Secondary | ICD-10-CM | POA: Diagnosis not present

## 2021-03-07 DIAGNOSIS — M7122 Synovial cyst of popliteal space [Baker], left knee: Secondary | ICD-10-CM

## 2021-03-07 NOTE — Patient Instructions (Signed)
Good to see you Overall does look better Try to focus more on pushing off on left foot Try immodium as we discussed Continue everything else  See me before the big day in June

## 2021-03-07 NOTE — Assessment & Plan Note (Signed)
Patient is responding fairly well to the injection.  Hopefully this will continue to help patient at this time and during his season with running.  Discussed with patient though we could consider the possibility of advanced imaging such as an MRI.  Patient on ultrasound does have what appears to be some lateral meniscal tear but patient states he has had to meniscal surgery previously.  Findings of the Baker's cyst is still noted as well but do not think it is large enough for any aspiration needed but can consider doing and follow-up as needed.  Patient will follow up with me again in 6 to 8 weeks.  Did discuss with him about his running gait and possibly making some very mild changes.

## 2021-03-07 NOTE — Assessment & Plan Note (Signed)
Still noted and will continue to monitor.

## 2021-03-26 DIAGNOSIS — D3132 Benign neoplasm of left choroid: Secondary | ICD-10-CM | POA: Diagnosis not present

## 2021-04-12 NOTE — Progress Notes (Signed)
[place] Phone: [phone] Subjective:   Paul Paul [name], am serving as a scribe for Paul Paul [name]. This visit occurred during the SARS-CoV-2 public health emergency.  Safety protocols were in place, including screening questions prior to the visit, additional usage of staff PPE, and extensive cleaning of exam room while observing appropriate contact time as indicated for disinfecting solutions.   I'm seeing this patient by the request  of:  Paul Frees, MD  CC: Knee pain follow-up  [id number]   03/07/2021 Patient is responding fairly well to the injection.  Hopefully this will continue to help patient at this time and during his season with running.  Discussed with patient though we could consider the possibility of advanced imaging such as an MRI.  Patient on ultrasound does have what appears to be some lateral meniscal tear but patient states he has had to meniscal surgery previously.  Findings of the Baker's cyst is still noted as well but do not think it is large enough for any aspiration needed but can consider doing and follow-up as needed.  Patient will follow up with me again in 6 to 8 weeks.  Did discuss with him about his running gait and possibly making some very mild changes.  Update 04/13/2021 Paul Paul [name] is a 70 y.o. male coming in with complaint of L knee pain. Patient states that 3 weeks after injection he felt some improvement. Had dry needling recently and feels that he is at 95%. Does feel like there is still something there over lateral calf insertion. Paul [name] longer keeping him awake at night.  Patient is happy with these results.  Bone spur on L ankle has also improved but he knows that it is there, still not stopping him from activity.  Patient still concerned more of the GI discomfort when patient is racing.  Patient states that he admitted to mild 12 of the half marathon and  unfortunately had to use the restroom.  Wanting to know what else he can do.  Attempted to using Imodium a couple days before the race and it did help him last longer but not significantly.     Past Medical History:  Diagnosis Date  . Depression    RESOLVED  . Hyperlipidemia   . Spinal stenosis    WAS HAVING NECK PAIN -PT STATES NERVE ABLATION PROCEDURE 2012 AND PAIN HAS RESOLVED  . Spondylitis [facility])    Past Surgical History:  Procedure Laterality Date  . APPENDECTOMY  1962  . HERNIA REPAIR    . INSERTION OF MESH  10/06/2012   Procedure: INSERTION OF MESH;  Surgeon: Imogene Burn. Georgette Dover, MD;  [facility];  Service: General;  Laterality: N/A;  . [place]   left  . TONSILLECTOMY  1968  . UMBILICAL HERNIA REPAIR  10/06/2012   Procedure: HERNIA REPAIR UMBILICAL ADULT;  Surgeon: Imogene Burn. Georgette Dover, MD;  [facility];  Service: General;  Laterality: N/A;   Social History   Socioeconomic History  . Marital status: Married    Spouse name: Not on file  . Number of children: Not on file  . Years of education: Not on file  . Highest education level: Not on file  Occupational History  . Not on file  Tobacco Use  . Smoking status: Never Smoker  . Smokeless tobacco: Never Used  Vaping Use  . Vaping Use: Never used  Substance and Sexual  Activity  . Alcohol use: Yes    Comment: OCCAS ALCOHOL  . Drug use: Paul [name]  . Sexual activity: Not on file  Other Topics Concern  . Not on file  Social History Narrative   ** Merged History Encounter **       Social Determinants of Health   Financial Resource Strain: Not on file  Food Insecurity: Not on file  Transportation Needs: Not on file  Physical Activity: Not on file  Stress: Not on file  Social Connections: Not on file   Allergies  Allergen Reactions  . Crestor [Rosuvastatin]     MUSCLE CRAMPS  . Lipitor [Atorvastatin]     Liver toxicity   Family History  Problem Relation Age of Onset  . Stroke Mother   .  Heart disease Father   . Cancer Sister        bone     Current Outpatient Medications (Cardiovascular):  .  rosuvastatin (CRESTOR) 20 MG tablet, Take 1 tablet (20 mg total) by mouth daily. .  nitroGLYCERIN (NITRO-DUR) 0.1 mg/hr patch, 1/4 patch daily to affected area  Current Outpatient Medications (Respiratory):  .  albuterol (VENTOLIN HFA) 108 (90 Base) MCG/ACT inhaler, Inhale 2 puffs into the lungs every 4 (four) hours as needed for wheezing or shortness of breath.   Current Outpatient Medications (Hematological):  .  ferrous sulfate 325 (65 FE) MG EC tablet, Take 65 mg by mouth daily. .  folic acid (FOLVITE) 1 MG tablet, Take 800 mcg by mouth daily. .  vitamin B-12 (CYANOCOBALAMIN) 1000 MCG tablet, Take 1,000 mcg by mouth daily.  Current Outpatient Medications (Other):  .  calcium-vitamin D (OSCAL WITH D) 500-200 MG-UNIT tablet, Take 1 tablet by mouth daily with breakfast. .  cholecalciferol (VITAMIN D3) 10 MCG (400 UNIT) TABS tablet, Take 5,000 Units by mouth daily. [name] Kitchen  co-enzyme Q-10 30 MG capsule, Take 200 mg by mouth daily. .  fish oil-omega-3 fatty acids 1000 MG capsule, Take 2 g by mouth daily.  [name] Kitchen  gabapentin (NEURONTIN) 100 MG capsule, Take 2 capsules (200 mg total) by mouth at bedtime. .  gabapentin (NEURONTIN) 100 MG capsule, Take 2 capsules (200 mg total) by mouth 3 (three) times daily. .  L-ARGININE-500 PO, Take 400 mg by mouth. Daily .  Multiple Vitamins-Minerals (CULTURELLE PROBIOTICS + MULTIV PO), Take by mouth daily. .  Quercetin 250 MG TABS, Take 500 mg by mouth daily. .  vitamin A 10000 UNIT capsule, Take 3,000 Units by mouth daily. .  vitamin C (ASCORBIC ACID) 500 MG tablet, Take 500 mg by mouth daily.   Reviewed prior external information including notes and imaging from  primary care provider As well as notes that were available from care everywhere and other healthcare systems.  Past medical history, social, surgical and family history all reviewed in  electronic medical record.  Paul [name] pertanent information unless stated regarding to the chief complaint.   Review of Systems:  Paul [name] headache, visual changes, nausea, vomiting, diarrhea, constipation, dizziness, abdominal pain, skin rash, fevers, chills, night sweats, weight loss, swollen lymph nodes, body aches, joint swelling, chest pain, shortness of breath, mood changes. POSITIVE muscle aches  Objective  Blood pressure 132/80, pulse (!) 52, height 5\' 10"  (1.778 m), weight 158 lb (71.7 kg), SpO2 99 %.   General: Paul [name] apparent distress alert and oriented x3 mood and affect normal, dressed appropriately.  HEENT: Pupils equal, extraocular movements intact  Respiratory: Patient's speak in full sentences and does not appear short of breath  Cardiovascular: Paul [name] lower extremity edema, non tender, Paul [name] erythema  Gait normal with good balance and coordination.  MSK: Left knee does still have some arthritic changes and mild fullness of the popliteal area.  Minimal tenderness noted.  Full range of motion of the knee.  Paul [name] significant instability of the knee noted.    Impression and Recommendations:     The above documentation has been reviewed and is accurate and complete Paul Pulley, DO

## 2021-04-13 ENCOUNTER — Ambulatory Visit: Payer: Self-pay

## 2021-04-13 ENCOUNTER — Ambulatory Visit (INDEPENDENT_AMBULATORY_CARE_PROVIDER_SITE_OTHER): Payer: Medicare Other | Admitting: Family Medicine

## 2021-04-13 ENCOUNTER — Other Ambulatory Visit: Payer: Self-pay

## 2021-04-13 ENCOUNTER — Encounter: Payer: Self-pay | Admitting: Family Medicine

## 2021-04-13 VITALS — BP 132/80 | HR 52 | Ht 70.0 in | Wt 158.0 lb

## 2021-04-13 DIAGNOSIS — G8929 Other chronic pain: Secondary | ICD-10-CM | POA: Diagnosis not present

## 2021-04-13 DIAGNOSIS — M1712 Unilateral primary osteoarthritis, left knee: Secondary | ICD-10-CM

## 2021-04-13 DIAGNOSIS — M25562 Pain in left knee: Secondary | ICD-10-CM

## 2021-04-13 DIAGNOSIS — M79A22 Nontraumatic compartment syndrome of left lower extremity: Secondary | ICD-10-CM

## 2021-04-13 NOTE — Patient Instructions (Signed)
Good luck next weekend Increase fat content in the morning meal or 1 bar on the bike Oatmeal in the morning w half bagel Small bar with high fat content 45 min to half hour into the bike Send me an update If doing well can see me when you need me and can talk thru MyChart

## 2021-04-13 NOTE — Assessment & Plan Note (Signed)
He is doing much better 2 months after the injection.  Discussed with patient about icing regimen and home exercises, which activities to potentially avoid.  Increase activity slowly.  Any worsening pain can consider the possibility of viscosupplementation or advanced imaging.

## 2021-04-23 ENCOUNTER — Encounter: Payer: Self-pay | Admitting: Family Medicine

## 2021-06-20 DIAGNOSIS — Z20822 Contact with and (suspected) exposure to covid-19: Secondary | ICD-10-CM | POA: Diagnosis not present

## 2021-07-11 DIAGNOSIS — H18413 Arcus senilis, bilateral: Secondary | ICD-10-CM | POA: Diagnosis not present

## 2021-07-11 DIAGNOSIS — H2513 Age-related nuclear cataract, bilateral: Secondary | ICD-10-CM | POA: Diagnosis not present

## 2021-07-11 DIAGNOSIS — H25013 Cortical age-related cataract, bilateral: Secondary | ICD-10-CM | POA: Diagnosis not present

## 2021-07-11 DIAGNOSIS — H35371 Puckering of macula, right eye: Secondary | ICD-10-CM | POA: Diagnosis not present

## 2021-07-11 DIAGNOSIS — H2511 Age-related nuclear cataract, right eye: Secondary | ICD-10-CM | POA: Diagnosis not present

## 2021-08-02 DIAGNOSIS — Z23 Encounter for immunization: Secondary | ICD-10-CM | POA: Diagnosis not present

## 2021-08-04 DIAGNOSIS — U071 COVID-19: Secondary | ICD-10-CM | POA: Diagnosis not present

## 2021-08-08 ENCOUNTER — Encounter (INDEPENDENT_AMBULATORY_CARE_PROVIDER_SITE_OTHER): Payer: Medicare Other | Admitting: Ophthalmology

## 2021-08-08 DIAGNOSIS — H3581 Retinal edema: Secondary | ICD-10-CM

## 2021-08-17 ENCOUNTER — Encounter (INDEPENDENT_AMBULATORY_CARE_PROVIDER_SITE_OTHER): Payer: Medicare Other | Admitting: Ophthalmology

## 2021-08-21 ENCOUNTER — Encounter: Payer: Self-pay | Admitting: Family Medicine

## 2021-08-22 NOTE — Progress Notes (Signed)
[facility] Note  08/24/2021     CHIEF COMPLAINT Patient presents for Retina Evaluation   HISTORY OF PRESENT ILLNESS: Paul [name] is a 70 y.o. male who presents to the clinic today for:   HPI     Retina Evaluation   In both eyes.  This started months ago.  Duration of months.  Context:  distance vision, mid-range vision and near vision.  I, the attending physician,  performed the HPI with the patient and updated documentation appropriately.        Comments   70 y/o male pt referred by Dr. Lucita Ferrara for clearance for cat sx.  Saw Dr. Lucita Ferrara about 1 mo ago.  VA "dull" OU.  Denies pain, FOL, floaters.  No gtts.      Last edited by Bernarda Caffey, MD on 08/26/2021  2:24 AM.    Pt is here on the referral of Dr. Lucita Ferrara for cataract clearance, pt also sees Dr. Heather [place] for gl and cl  Referring physician: Vevelyn Royals, MD  HISTORICAL INFORMATION:   Selected notes from the MEDICAL RECORD NUMBER Referred by Dr. Lucita Ferrara for retina eval and cataract clearance LEE:  Ocular Hx- PMH-    CURRENT MEDICATIONS: No current outpatient medications on file. (Ophthalmic Drugs)   No current facility-administered medications for this visit. (Ophthalmic Drugs)   Current Outpatient Medications (Other)  Medication Sig   albuterol (VENTOLIN HFA) 108 (90 Base) MCG/ACT inhaler Inhale 2 puffs into the lungs every 4 (four) hours as needed for wheezing or shortness of breath.   aspirin 81 MG EC tablet Take by mouth.   calcium-vitamin D (OSCAL WITH D) 500-200 MG-UNIT tablet Take 1 tablet by mouth daily with breakfast.   cholecalciferol (VITAMIN D3) 10 MCG (400 UNIT) TABS tablet Take 5,000 Units by mouth daily.   co-enzyme Q-10 30 MG capsule Take 200 mg by mouth daily.   cyanocobalamin (,VITAMIN B-12,) 1000 MCG/ML injection Take by mouth.   ferrous sulfate 325 (65 FE) MG EC tablet Take 65 mg by mouth daily.   fish oil-omega-3 fatty acids 1000  MG capsule Take 2 g by mouth daily.    folic acid (FOLVITE) 1 MG tablet Take 800 mcg by mouth daily.   L-ARGININE-500 PO Take 400 mg by mouth. Daily   Multiple Vitamins-Minerals (CULTURELLE PROBIOTICS + MULTIV PO) Take by mouth daily.   predniSONE (DELTASONE) 20 MG tablet Take 2 tablets (40 mg total) by mouth daily with breakfast.   Probiotic, Lactobacillus, CAPS Take by mouth.   Quercetin 250 MG TABS Take 500 mg by mouth daily.   Quercetin Dihydrate POWD Take by mouth.   rosuvastatin (CRESTOR) 20 MG tablet Take 1 tablet (20 mg total) by mouth daily.   vitamin A 10000 UNIT capsule Take 3,000 Units by mouth daily.   vitamin B-12 (CYANOCOBALAMIN) 1000 MCG tablet Take 1,000 mcg by mouth daily.   vitamin C (ASCORBIC ACID) 500 MG tablet Take 500 mg by mouth daily.   gabapentin (NEURONTIN) 100 MG capsule Take 2 capsules (200 mg total) by mouth at bedtime.   gabapentin (NEURONTIN) 100 MG capsule Take 2 capsules (200 mg total) by mouth 3 (three) times daily.   nitroGLYCERIN (NITRO-DUR) 0.1 mg/hr patch 1/4 patch daily to affected area   No current facility-administered medications for this visit. (Other)      REVIEW OF SYSTEMS: ROS   Positive for: Musculoskeletal, Cardiovascular, Eyes Negative for: Constitutional, Gastrointestinal, Neurological, Skin, Genitourinary, HENT, Endocrine, Respiratory, Psychiatric, Allergic/Imm,  Heme/Lymph Last edited by Matthew Folks, COA on 08/24/2021  9:16 AM.       ALLERGIES Allergies  Allergen Reactions   Crestor [Rosuvastatin]     MUSCLE CRAMPS   Lipitor [Atorvastatin]     Liver toxicity    PAST MEDICAL HISTORY Past Medical History:  Diagnosis Date   Cataract    Depression    RESOLVED   Hyperlipidemia    Spinal stenosis    WAS HAVING NECK PAIN -PT STATES NERVE ABLATION PROCEDURE 2012 AND PAIN HAS RESOLVED   Spondylitis ([place])    Past Surgical History:  Procedure Laterality Date   APPENDECTOMY  1962   HERNIA REPAIR     INSERTION OF MESH   10/06/2012   Procedure: INSERTION OF MESH;  Surgeon: Imogene Burn. Georgette Dover, MD;  Location: [facility];  Service: General;  Laterality: N/A;   [place]   left   TONSILLECTOMY  [id number]   UMBILICAL HERNIA REPAIR  10/06/2012   Procedure: HERNIA REPAIR UMBILICAL ADULT;  Surgeon: Imogene Burn. Tsuei, MD;  Location: [facility];  Service: General;  Laterality: N/A;    FAMILY HISTORY Family History  Problem Relation Age of Onset   Stroke Mother    Heart disease Father    Cancer Sister        bone    SOCIAL HISTORY Social History   Tobacco Use   Smoking status: Never   Smokeless tobacco: Never  Vaping Use   Vaping Use: Never used  Substance Use Topics   Alcohol use: Yes    Comment: OCCAS ALCOHOL   Drug use: No         OPHTHALMIC EXAM:  Base Eye Exam     Visual Acuity (Snellen - Linear)       Right Left   Dist cc 20/25 -2 20/20 -2   Dist ph cc NI     Correction: Glasses         Tonometry (Tonopen, 9:33 AM)       Right Left   Pressure 12 14         Pupils       Dark Light Shape React APD   Right 3 2 Round Brisk None   Left 3 2 Round Brisk None         Visual Fields (Counting fingers)       Left Right    Full Full         Extraocular Movement       Right Left    Full, Ortho Full, Ortho         Neuro/Psych     Oriented x3: Yes   Mood/Affect: Normal         Dilation     Both eyes: 1.0% Mydriacyl, 2.5% Phenylephrine @ 9:33 AM           Slit Lamp and Fundus Exam     Slit Lamp Exam       Right Left   Lids/Lashes Dermatochalasis - upper lid, mild MGD Dermatochalasis - upper lid, mild MGD   Conjunctiva/Sclera White and quiet White and quiet   Cornea mild arcus mild arcus, trace PEE   Anterior Chamber deep, clear, narrow temporal angle deep, clear, narrow temporal angle   Iris Round and dilated Round and dilated   Lens 2-3+ Nuclear sclerosis, 2-3+ Cortical cataract 2-3+ Nuclear sclerosis, 2-3+ Cortical cataract   Vitreous  Vitreous syneresis Vitreous syneresis  Fundus Exam       Right Left   Disc Pink and Sharp, Compact Pink and Sharp, mild tilt   C/D Ratio 0.3 0.3   Macula Flat, Good foveal reflex, mild RPE mottling, No heme or edema Flat, blunted foveal reflex, mild ERM with early striae, No heme or edema   Vessels mild attenuation mild attenuation   Periphery Attached, No heme, No RT/RD Attached, mild pigmented cystoid degeneration, No heme, No RT/RD, flat CR nevus SN to disc (1.25DD)           Refraction     Wearing Rx       Sphere Cylinder Axis Add   Right +1.00 +1.25 168 +2.75   Left +0.50 +0.75 178 +2.75    Age: [age] yrs   Type: PAL         Manifest Refraction       Sphere Cylinder Axis Dist VA   Right +1.25 +1.50 170 20/25+   Left +0.75 +1.00 180 20/20-            IMAGING AND PROCEDURES  Imaging and Procedures for 08/24/2021  OCT, Retina - OU - Both Eyes       Right Eye Quality was good. Central Foveal Thickness: 282. Progression has no prior data. Findings include normal foveal contour, no IRF, no SRF (Trace ERM).   Left Eye Quality was good. Central Foveal Thickness: 353. Progression has no prior data. Findings include abnormal foveal contour, epiretinal membrane, no IRF, no SRF (Mild ERM with blunted foveal contour).   Notes *Images captured and stored on drive  Diagnosis / Impression:  OD: NFP, no IRF/SRF OS: Mild ERM with blunted foveal contour  Clinical management:  See below  Abbreviations: NFP - Normal foveal profile. CME - cystoid macular edema. PED - pigment epithelial detachment. IRF - intraretinal fluid. SRF - subretinal fluid. EZ - ellipsoid zone. ERM - epiretinal membrane. ORA - outer retinal atrophy. ORT - outer retinal tubulation. SRHM - subretinal hyper-reflective material. IRHM - intraretinal hyper-reflective material            ASSESSMENT/PLAN:    ICD-10-CM   1. Epiretinal membrane (ERM) of left eye  H35.372     2. Retinal  edema  H35.81 OCT, Retina - OU - Both Eyes    3. 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 Combined forms of age-related cataract of both eyes  H25.813     1,2. Epiretinal membrane, left eye - The natural history, anatomy, potential for loss of vision, and treatment options including vitrectomy techniques and the complications of endophthalmitis, retinal detachment, vitreous hemorrhage, cataract progression and permanent vision loss discussed with the patient. - mild ERM - BCVA 20/25 OD, 20/20 OS - asymptomatic, no metamorphopsia - no indication for surgery at this time - monitor for now - clear from a retina standpoint to proceed with cataract surgery when pt and surgeon are ready  - f/u 4 mos -- DFE/OCT  3. Choroidal Nevus, OS  - 1.25 DD pigmented lesion SN to disc  - no visual symptoms, SRF or orange pigment  - no drusen  - flat, thickness < 57mm  - discussed findings, prognosis  - monitor  4. Mixed Cataract OU - The symptoms of cataract, surgical options, and treatments and risks were discussed with patient. - discussed diagnosis and progression - under the expert management of Dr. Lucita Ferrara - clear from a retina standpoint to proceed with cataract surgery when pt and  surgeon are ready  Ophthalmic Meds Ordered this visit:  No orders of the defined types were placed in this encounter.    Return in about 4 months (around 12/25/2021) for f/u ERM OS, DFE, OCT.  There are no Patient Instructions on file for this visit.   Explained the diagnoses, plan, and follow up with the patient and they expressed understanding.  Patient expressed understanding of the importance of proper follow up care.   This document serves as a record of services personally performed by Gardiner Sleeper, MD, PhD. It was created on their behalf by Leonie Douglas, an ophthalmic technician. The creation of this record is the provider's dictation and/or activities during the visit.    Electronically signed  by: Leonie Douglas COA, 08/26/21  2:29 AM  This document serves as a record of services personally performed by Gardiner Sleeper, MD, PhD. It was created on their behalf by San Jetty. Owens Shark, OA an ophthalmic technician. The creation of this record is the provider's dictation and/or activities during the visit.    Electronically signed by: San Jetty. Owens Shark, [place] 10.07.2022 2:29 AM  Gardiner Sleeper, M.D., Ph.D. Diseases & Surgery of the Retina and [place] 08/24/2021  I have reviewed the above documentation for accuracy and completeness, and I agree with the above. Gardiner Sleeper, M.D., Ph.D. 08/26/21 2:29 AM   Abbreviations: M myopia (nearsighted); A astigmatism; H hyperopia (farsighted); P presbyopia; Mrx spectacle prescription;  CTL contact lenses; OD right eye; OS left eye; OU both eyes  XT exotropia; ET esotropia; PEK punctate epithelial keratitis; PEE punctate epithelial erosions; DES dry eye syndrome; MGD meibomian gland dysfunction; ATs artificial tears; PFAT's preservative free artificial tears; [place] nuclear sclerotic cataract; PSC posterior subcapsular cataract; ERM epi-retinal membrane; PVD posterior vitreous detachment; RD retinal detachment; DM diabetes mellitus; DR diabetic retinopathy; NPDR non-proliferative diabetic retinopathy; PDR proliferative diabetic retinopathy; CSME clinically significant macular edema; DME diabetic macular edema; dbh dot blot hemorrhages; CWS cotton wool spot; POAG primary open angle glaucoma; C/D cup-to-disc ratio; HVF humphrey visual field; GVF goldmann visual field; OCT optical coherence tomography; IOP intraocular pressure; BRVO Branch retinal vein occlusion; CRVO central retinal vein occlusion; CRAO central retinal artery occlusion; BRAO branch retinal artery occlusion; RT retinal tear; SB scleral buckle; PPV pars plana vitrectomy; VH Vitreous hemorrhage; PRP panretinal laser photocoagulation; IVK intravitreal kenalog; VMT  vitreomacular traction; MH Macular hole;  NVD neovascularization of the disc; NVE neovascularization elsewhere; AREDS age related eye disease study; ARMD age related macular degeneration; POAG primary open angle glaucoma; EBMD epithelial/anterior basement membrane dystrophy; ACIOL anterior chamber intraocular lens; IOL intraocular lens; PCIOL posterior chamber intraocular lens; Phaco/IOL phacoemulsification with intraocular lens placement; [place] photorefractive keratectomy; LASIK laser assisted in situ keratomileusis; HTN hypertension; DM diabetes mellitus; COPD chronic obstructive pulmonary disease

## 2021-08-22 NOTE — Progress Notes (Signed)
Paul [name] Phone: [phone] Subjective:    I'm seeing this patient by the request  of:  Shirline Frees, MD  CC: Left knee pain  JSE:[id number]  04/13/2021 He is doing much better 2 months after the injection.  Discussed with patient about icing regimen and home exercises, which activities to potentially avoid.  Increase activity slowly.  Any worsening pain can consider the possibility of viscosupplementation or advanced imaging  Updated on 08/23/2021 Paul [name] is a 70 y.o. male coming in with complaint of left knee pain. Talk about COVID and training. Patient is racing at end of month. Having pain in middle to lower back on L side of spine.  Patient states it has been a dull aching pain but nothing severe.  Has never really had back pain previously.  Also having L knee, posterior pain. Is unable to fully extend L knee and has pain with full flexion.  Patient has had difficulty with the knee and does have surprisingly a meniscal tear with some arthritic changes.  Patient has also had some exertional compartment syndrome of the left lower extremity on different occasions.  Has responded well to injection previously.      Past Medical History:  Diagnosis Date   Depression    RESOLVED   Hyperlipidemia    Spinal stenosis    WAS HAVING NECK PAIN -PT STATES NERVE ABLATION PROCEDURE 2012 AND PAIN HAS RESOLVED   Spondylitis ([place])    Past Surgical History:  Procedure Laterality Date   APPENDECTOMY  1962   HERNIA REPAIR     INSERTION OF MESH  10/06/2012   Procedure: INSERTION OF MESH;  Surgeon: Imogene Burn. Georgette Dover, MD;  Location: [facility];  Service: General;  Laterality: N/A;   [place]   left   TONSILLECTOMY  [id number]   UMBILICAL HERNIA REPAIR  10/06/2012   Procedure: HERNIA REPAIR UMBILICAL ADULT;  Surgeon: Imogene Burn. Georgette Dover, MD;  Location: [facility];  Service: General;  Laterality: N/A;   Social  History   Socioeconomic History   Marital status: Married    Spouse name: Not on file   Number of children: Not on file   Years of education: Not on file   Highest education level: Not on file  Occupational History   Not on file  Tobacco Use   Smoking status: Never   Smokeless tobacco: Never  Vaping Use   Vaping Use: Never used  Substance and Sexual Activity   Alcohol use: Yes    Comment: OCCAS ALCOHOL   Drug use: No   Sexual activity: Not on file  Other Topics Concern   Not on file  Social History Narrative   ** Merged History Encounter **       Social Determinants of Health   Financial Resource Strain: Not on file  Food Insecurity: Not on file  Transportation Needs: Not on file  Physical Activity: Not on file  Stress: Not on file  Social Connections: Not on file   Allergies  Allergen Reactions   Crestor [Rosuvastatin]     MUSCLE CRAMPS   Lipitor [Atorvastatin]     Liver toxicity   Family History  Problem Relation Age of Onset   Stroke Mother    Heart disease Father    Cancer Sister        bone    Current Outpatient Medications (Endocrine & Metabolic):    predniSONE (DELTASONE) 20 MG  tablet, Take 2 tablets (40 mg total) by mouth daily with breakfast.  Current Outpatient Medications (Cardiovascular):    rosuvastatin (CRESTOR) 20 MG tablet, Take 1 tablet (20 mg total) by mouth daily.   nitroGLYCERIN (NITRO-DUR) 0.1 mg/hr patch, 1/4 patch daily to affected area  Current Outpatient Medications (Respiratory):    albuterol (VENTOLIN HFA) 108 (90 Base) MCG/ACT inhaler, Inhale 2 puffs into the lungs every 4 (four) hours as needed for wheezing or shortness of breath.   Current Outpatient Medications (Hematological):    ferrous sulfate 325 (65 FE) MG EC tablet, Take 65 mg by mouth daily.   folic acid (FOLVITE) 1 MG tablet, Take 800 mcg by mouth daily.   vitamin B-12 (CYANOCOBALAMIN) 1000 MCG tablet, Take 1,000 mcg by mouth daily.  Current Outpatient  Medications (Other):    calcium-vitamin D (OSCAL WITH D) 500-200 MG-UNIT tablet, Take 1 tablet by mouth daily with breakfast.   cholecalciferol (VITAMIN D3) 10 MCG (400 UNIT) TABS tablet, Take 5,000 Units by mouth daily.   co-enzyme Q-10 30 MG capsule, Take 200 mg by mouth daily.   fish oil-omega-3 fatty acids 1000 MG capsule, Take 2 g by mouth daily.    L-ARGININE-500 PO, Take 400 mg by mouth. Daily   Multiple Vitamins-Minerals (CULTURELLE PROBIOTICS + MULTIV PO), Take by mouth daily.   Quercetin 250 MG TABS, Take 500 mg by mouth daily.   vitamin A 10000 UNIT capsule, Take 3,000 Units by mouth daily.   vitamin C (ASCORBIC ACID) 500 MG tablet, Take 500 mg by mouth daily.   gabapentin (NEURONTIN) 100 MG capsule, Take 2 capsules (200 mg total) by mouth at bedtime.   gabapentin (NEURONTIN) 100 MG capsule, Take 2 capsules (200 mg total) by mouth 3 (three) times daily.   Reviewed prior external information including notes and imaging from  primary care provider As well as notes that were available from care everywhere and other healthcare systems.  Past medical history, social, surgical and family history all reviewed in electronic medical record.  No pertanent information unless stated regarding to the chief complaint.   Review of Systems:  No headache, visual changes, nausea, vomiting, diarrhea, constipation, dizziness, abdominal pain, skin rash, fevers, chills, night sweats, weight loss, swollen lymph nodes, body aches, joint swelling, chest pain, shortness of breath, mood changes. POSITIVE muscle aches  Objective  Blood pressure 140/80, pulse 76, height 5\' 10"  (1.778 m), weight 151 lb (68.5 kg), SpO2 96 %.   General: No apparent distress alert and oriented x3 mood and affect normal, dressed appropriately.  HEENT: Pupils equal, extraocular movements intact  Respiratory: Patient's speak in full sentences and does not appear short of breath  Cardiovascular: No lower extremity edema, non  tender, no erythema  Gait overall relatively normal Left knee exam shows the patient does have some mild instability.  Mild positive McMurray's more on the lateral aspect.  Mild lateral tracking of the patella noted. Back exam shows some tightness of the hip flexor on the left side.  Negative straight leg test.  After informed written and verbal consent, patient was seated on exam table. Left knee was prepped with alcohol swab and utilizing anterolateral approach, patient's left knee space was injected with 4:1  marcaine 0.5%: Kenalog 40mg /dL. Patient tolerated the procedure well without immediate complications.    Impression and Recommendations:     The above documentation has been reviewed and is accurate and complete [name], DO

## 2021-08-23 ENCOUNTER — Ambulatory Visit: Payer: Self-pay

## 2021-08-23 ENCOUNTER — Ambulatory Visit (INDEPENDENT_AMBULATORY_CARE_PROVIDER_SITE_OTHER): Payer: Medicare Other

## 2021-08-23 ENCOUNTER — Other Ambulatory Visit: Payer: Self-pay

## 2021-08-23 ENCOUNTER — Ambulatory Visit (INDEPENDENT_AMBULATORY_CARE_PROVIDER_SITE_OTHER): Payer: Medicare Other | Admitting: Family Medicine

## 2021-08-23 VITALS — BP 140/80 | HR 76 | Ht 70.0 in | Wt 151.0 lb

## 2021-08-23 DIAGNOSIS — R079 Chest pain, unspecified: Secondary | ICD-10-CM

## 2021-08-23 DIAGNOSIS — R109 Unspecified abdominal pain: Secondary | ICD-10-CM

## 2021-08-23 DIAGNOSIS — M25562 Pain in left knee: Secondary | ICD-10-CM | POA: Diagnosis not present

## 2021-08-23 DIAGNOSIS — M1712 Unilateral primary osteoarthritis, left knee: Secondary | ICD-10-CM

## 2021-08-23 MED ORDER — PREDNISONE 20 MG PO TABS: 40.0000 mg | ORAL_TABLET | Freq: Every day | ORAL | 0 refills | Status: DC

## 2021-08-23 NOTE — Patient Instructions (Signed)
Xray today Injected knee Prednisone 40 mg take in 10 days if not perfect See me again in 4 weeks Good luck with the next race

## 2021-08-24 ENCOUNTER — Ambulatory Visit (INDEPENDENT_AMBULATORY_CARE_PROVIDER_SITE_OTHER): Payer: Medicare Other | Admitting: Ophthalmology

## 2021-08-24 ENCOUNTER — Encounter: Payer: Self-pay | Admitting: Family Medicine

## 2021-08-24 ENCOUNTER — Encounter (INDEPENDENT_AMBULATORY_CARE_PROVIDER_SITE_OTHER): Payer: Self-pay | Admitting: Ophthalmology

## 2021-08-24 DIAGNOSIS — H25813 Combined forms of age-related cataract, bilateral: Secondary | ICD-10-CM | POA: Diagnosis not present

## 2021-08-24 DIAGNOSIS — D3132 Benign neoplasm of left choroid: Secondary | ICD-10-CM

## 2021-08-24 DIAGNOSIS — H35372 Puckering of macula, left eye: Secondary | ICD-10-CM | POA: Diagnosis not present

## 2021-08-24 DIAGNOSIS — H3581 Retinal edema: Secondary | ICD-10-CM

## 2021-08-24 NOTE — Assessment & Plan Note (Signed)
Patient was given injection.  Discussed icing regimen and home exercises.  Discussed which activities to doing which wants to avoid.  Patient could be potentially candidate for viscosupplementation and may consider.  Differential still includes the possibility of the exertional compartment syndrome as well as may need advanced imaging such as an MRI with patient having potentially worsening symptoms.  Follow-up with me again 4 to 6 weeks

## 2021-08-26 ENCOUNTER — Encounter (INDEPENDENT_AMBULATORY_CARE_PROVIDER_SITE_OTHER): Payer: Self-pay | Admitting: Ophthalmology

## 2021-09-20 NOTE — Progress Notes (Signed)
[place] Phone: [phone] Subjective:   Paul [name], am serving as a scribe for Dr. Hulan Saas. This visit occurred during the SARS-CoV-2 public health emergency.  Safety protocols were in place, including screening questions prior to the visit, additional usage of staff PPE, and extensive cleaning of exam room while observing appropriate contact time as indicated for disinfecting solutions.   I'm seeing this patient by the request  of:  Shirline Frees, MD  CC: Left knee pain  [id number]  08/23/2021 Patient was given injection.  Discussed icing regimen and home exercises.  Discussed which activities to doing which wants to avoid.  Patient could be potentially candidate for viscosupplementation and may consider.  Differential still includes the possibility of the exertional compartment syndrome as well as may need advanced imaging such as an MRI with patient having potentially worsening symptoms.  Follow-up with me again 4 to 6 weeks  Updated 09/24/2021 Paul [name] is a 70 y.o. male coming in with complaint of left knee pain. Ran [employer] 10 days ago. Patient tripped at finish line. Slight back pain since fall.   Also slipped while hiking at [facility] and feels that knee is slightly more painful. Unable to obtain full ROM. Tightness in back of knee.  Patient states that he is continuing to have difficulty with range of motion.  Any twisting motion causes severe pain. Walking in a straight line seems to be okay but can have pain at night as well.  Xray chest and abd (-)      Past Medical History:  Diagnosis Date   Cataract    Depression    RESOLVED   Hyperlipidemia    Spinal stenosis    WAS HAVING NECK PAIN -PT STATES NERVE ABLATION PROCEDURE 2012 AND PAIN HAS RESOLVED   Spondylitis ([place])    Past Surgical History:  Procedure Laterality Date   APPENDECTOMY  1962   HERNIA REPAIR      INSERTION OF MESH  10/06/2012   Procedure: INSERTION OF MESH;  Surgeon: Imogene Burn. Georgette Dover, MD;  Location: [facility];  Service: General;  Laterality: N/A;   [place]   left   TONSILLECTOMY  6712   UMBILICAL HERNIA REPAIR  10/06/2012   Procedure: HERNIA REPAIR UMBILICAL ADULT;  Surgeon: Imogene Burn. Georgette Dover, MD;  Location: [facility];  Service: General;  Laterality: N/A;   Social History   Socioeconomic History   Marital status: Married    Spouse name: Not on file   Number of children: Not on file   Years of education: Not on file   Highest education level: Not on file  Occupational History   Not on file  Tobacco Use   Smoking status: Never   Smokeless tobacco: Never  Vaping Use   Vaping Use: Never used  Substance and Sexual Activity   Alcohol use: Yes    Comment: OCCAS ALCOHOL   Drug use: [name]   Sexual activity: Not on file  Other Topics Concern   Not on file  Social History Narrative   ** Merged History Encounter **       Social Determinants of Health   Financial Resource Strain: Not on file  Food Insecurity: Not on file  Transportation Needs: Not on file  Physical Activity: Not on file  Stress: Not on file  Social Connections: Not on file   Allergies  Allergen Reactions   Crestor [Rosuvastatin]  MUSCLE CRAMPS   Lipitor [Atorvastatin]     Liver toxicity   Family History  Problem Relation Age of Onset   Stroke Mother    Heart disease Father    Cancer Sister        bone    Current Outpatient Medications (Endocrine & Metabolic):    predniSONE (DELTASONE) 20 MG tablet, Take 2 tablets (40 mg total) by mouth daily with breakfast.  Current Outpatient Medications (Cardiovascular):    rosuvastatin (CRESTOR) 20 MG tablet, Take 1 tablet (20 mg total) by mouth daily.   nitroGLYCERIN (NITRO-DUR) 0.1 mg/hr patch, 1/4 patch daily to affected area  Current Outpatient Medications (Respiratory):    albuterol (VENTOLIN HFA) 108 (90 Base) MCG/ACT inhaler, Inhale 2  puffs into the lungs every 4 (four) hours as needed for wheezing or shortness of breath.  Current Outpatient Medications (Analgesics):    aspirin 81 MG EC tablet, Take by mouth.  Current Outpatient Medications (Hematological):    cyanocobalamin (,VITAMIN B-12,) 1000 MCG/ML injection, Take by mouth.   ferrous sulfate 325 (65 FE) MG EC tablet, Take 65 mg by mouth daily.   folic acid (FOLVITE) 1 MG tablet, Take 800 mcg by mouth daily.   vitamin B-12 (CYANOCOBALAMIN) 1000 MCG tablet, Take 1,000 mcg by mouth daily.  Current Outpatient Medications (Other):    calcium-vitamin D (OSCAL WITH D) 500-200 MG-UNIT tablet, Take 1 tablet by mouth daily with breakfast.   cholecalciferol (VITAMIN D3) 10 MCG (400 UNIT) TABS tablet, Take 5,000 Units by mouth daily.   co-enzyme Q-10 30 MG capsule, Take 200 mg by mouth daily.   fish oil-omega-3 fatty acids 1000 MG capsule, Take 2 g by mouth daily.    L-ARGININE-500 PO, Take 400 mg by mouth. Daily   Multiple Vitamins-Minerals (CULTURELLE PROBIOTICS + MULTIV PO), Take by mouth daily.   Probiotic, Lactobacillus, CAPS, Take by mouth.   Quercetin 250 MG TABS, Take 500 mg by mouth daily.   Quercetin Dihydrate POWD, Take by mouth.   vitamin A 10000 UNIT capsule, Take 3,000 Units by mouth daily.   vitamin C (ASCORBIC ACID) 500 MG tablet, Take 500 mg by mouth daily.   gabapentin (NEURONTIN) 100 MG capsule, Take 2 capsules (200 mg total) by mouth at bedtime.   gabapentin (NEURONTIN) 100 MG capsule, Take 2 capsules (200 mg total) by mouth 3 (three) times daily.    Review of Systems:  [name] headache, visual changes, nausea, vomiting, diarrhea, constipation, dizziness, abdominal pain, skin rash, fevers, chills, night sweats, weight loss, swollen lymph nodes, body aches, joint swelling, chest pain, shortness of breath, mood changes. POSITIVE muscle aches  Objective  Blood pressure 118/72, pulse 61, height 5\' 10"  (1.778 m), weight 155 lb (70.3 kg), SpO2 99 %.   General:  [name] apparent distress alert and oriented x3 mood and affect normal, dressed appropriately.  HEENT: Pupils equal, extraocular movements intact  Respiratory: Patient's speak in full sentences and does not appear short of breath  Cardiovascular: [name] lower extremity edema, non tender, [name] erythema  Gait normal with good balance and coordination.  MSK: Left knee exam shows the patient does have limited extension of 5 degrees and lacks last 10 degrees of flexion.  Patient does have positive McMurray's noted.  Tenderness to palpation over the lateral joint space.  Patient does have mild crepitus noted.  Swelling of the patellofemoral joint noted    Impression and Recommendations:     The above documentation has been reviewed and is accurate and complete Olevia Bowens  Tamala Julian, DO

## 2021-09-24 ENCOUNTER — Ambulatory Visit (INDEPENDENT_AMBULATORY_CARE_PROVIDER_SITE_OTHER): Payer: Medicare Other | Admitting: Family Medicine

## 2021-09-24 ENCOUNTER — Other Ambulatory Visit: Payer: Self-pay

## 2021-09-24 VITALS — BP 118/72 | HR 61 | Ht 70.0 in | Wt 155.0 lb

## 2021-09-24 DIAGNOSIS — M25562 Pain in left knee: Secondary | ICD-10-CM

## 2021-09-24 DIAGNOSIS — M1712 Unilateral primary osteoarthritis, left knee: Secondary | ICD-10-CM

## 2021-09-24 DIAGNOSIS — G8929 Other chronic pain: Secondary | ICD-10-CM

## 2021-09-24 NOTE — Assessment & Plan Note (Signed)
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  Now having limitation in extension and flexion of the knee.  On ultrasound today does appear to have an acute injury to the lateral meniscus.  Patient does have chronic surgical changes noted of the medial meniscus as well.  Patient does have an effusion of the patellofemoral joint.  Patient did respond well to the injection previously but now with him continuing to have the difficulty and this being his off season I do feel advanced imaging is warranted and patient would be a candidate for surgical intervention if necessary.  Patient knows if any worsening locking occurs that he needs to seek medical attention immediately.  Follow-up with me again after imaging to discuss further.

## 2021-09-24 NOTE — Patient Instructions (Signed)
Congrats! MRI L knee [phone] Will write you with results

## 2021-09-25 DIAGNOSIS — H2512 Age-related nuclear cataract, left eye: Secondary | ICD-10-CM | POA: Diagnosis not present

## 2021-09-25 DIAGNOSIS — H25042 Posterior subcapsular polar age-related cataract, left eye: Secondary | ICD-10-CM | POA: Diagnosis not present

## 2021-09-25 DIAGNOSIS — H2511 Age-related nuclear cataract, right eye: Secondary | ICD-10-CM | POA: Diagnosis not present

## 2021-09-30 ENCOUNTER — Ambulatory Visit
Admission: RE | Admit: 2021-09-30 | Discharge: 2021-09-30 | Disposition: A | Discharge status: Home / Self Care | Payer: Medicare Other | Source: Ambulatory Visit | Attending: Family Medicine | Admitting: Family Medicine

## 2021-09-30 ENCOUNTER — Other Ambulatory Visit: Payer: Self-pay

## 2021-09-30 DIAGNOSIS — S83512A Sprain of anterior cruciate ligament of left knee, initial encounter: Secondary | ICD-10-CM | POA: Diagnosis not present

## 2021-09-30 DIAGNOSIS — S83282A Other tear of lateral meniscus, current injury, left knee, initial encounter: Secondary | ICD-10-CM | POA: Diagnosis not present

## 2021-09-30 DIAGNOSIS — G8929 Other chronic pain: Secondary | ICD-10-CM

## 2021-09-30 DIAGNOSIS — M25562 Pain in left knee: Secondary | ICD-10-CM

## 2021-09-30 DIAGNOSIS — M25462 Effusion, left knee: Secondary | ICD-10-CM | POA: Diagnosis not present

## 2021-10-01 ENCOUNTER — Encounter: Payer: Self-pay | Admitting: Family Medicine

## 2021-10-02 DIAGNOSIS — H2512 Age-related nuclear cataract, left eye: Secondary | ICD-10-CM | POA: Diagnosis not present

## 2021-10-08 NOTE — Progress Notes (Signed)
  [place] Phone: [phone] Subjective:   Paul [name], am serving as a scribe for Dr. Hulan Saas.  This visit occurred during the SARS-CoV-2 public health emergency.  Safety protocols were in place, including screening questions prior to the visit, additional usage of staff PPE, and extensive cleaning of exam room while observing appropriate contact time as indicated for disinfecting solutions.   I'm seeing this patient by the request  of:  Shirline Frees, MD  CC: Left knee pain  [id number]  09/24/2021 Chronic problem with exacerbation patient does have some known arthritic changes of the knee but I am concerned that patient also may have a lateral meniscal tear noted.  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  Now having limitation in extension and flexion of the knee.  On ultrasound today does appear to have an acute injury to the lateral meniscus.  Patient does have chronic surgical changes noted of the medial meniscus as well.  Patient does have an effusion of the patellofemoral joint.  Patient did respond well to the injection previously but now with him continuing to have the difficulty and this being his off season I do feel advanced imaging is warranted and patient would be a candidate for surgical intervention if necessary.  Patient knows if any worsening locking occurs that he needs to seek medical attention immediately.  Follow-up with me again after imaging to discuss further.  Update 10/09/2021 Paul [name] is a 70 y.o. male coming in with complaint of L knee pain.  Patient was able to do a triathlon without any significant discomfort.  Did have [name] the MRI secondary to the severity of intermittent instability and pain that occurs in this knee and calf.  MRI did show the patient did have faint posterior horn medial meniscal tear noted about where patient's  previous surgery was.  He does have a complete ACL tear also noted as well as partial thickness cartilage loss of all 3 compartments of the knee.  Patient states that he has more instability with running than pain.        Objective  Blood pressure 120/74, pulse (!) 58, height 5\' 10"  (1.778 m), weight 158 lb (71.7 kg), SpO2 98 %.   General: [name] apparent distress alert and oriented x3 mood and affect normal, dressed appropriately.  HEENT: Pupils equal, extraocular movements intact  Respiratory: Patient's speak in full sentences and does not appear short of breath  MSK:     After informed written and verbal consent, patient was seated on exam table. Left knee was prepped with alcohol swab and utilizing anterolateral approach, patient's left knee space was injected with with a 21-gauge 2 inch needle injected with 2 cc of 0.5% Marcaine and injected 6 cc of pre centrifuge PRP. Patient tolerated the procedure well without immediate complications.   Impression and Recommendations:     The above documentation has been reviewed and is accurate and complete [name], DO

## 2021-10-09 ENCOUNTER — Other Ambulatory Visit: Payer: Self-pay

## 2021-10-09 ENCOUNTER — Ambulatory Visit (INDEPENDENT_AMBULATORY_CARE_PROVIDER_SITE_OTHER): Payer: Self-pay | Admitting: Family Medicine

## 2021-10-09 DIAGNOSIS — M1712 Unilateral primary osteoarthritis, left knee: Secondary | ICD-10-CM

## 2021-10-09 NOTE — Patient Instructions (Signed)
No ice or IBU for 3 days Ok to use Tylenol and heat See me in 6 weeks

## 2021-10-09 NOTE — Assessment & Plan Note (Addendum)
Patient does have arthritic changes of the knee and does have an ACL rupture.  Patient is doing better overall.  Discussed icing regimen and home exercises.  Post PRP instructions given.

## 2021-10-31 ENCOUNTER — Encounter: Payer: Self-pay | Admitting: Family Medicine

## 2021-11-07 ENCOUNTER — Ambulatory Visit (INDEPENDENT_AMBULATORY_CARE_PROVIDER_SITE_OTHER): Payer: Medicare Other

## 2021-11-07 ENCOUNTER — Other Ambulatory Visit: Payer: Self-pay

## 2021-11-07 ENCOUNTER — Encounter: Payer: Self-pay | Admitting: Family Medicine

## 2021-11-07 ENCOUNTER — Ambulatory Visit (INDEPENDENT_AMBULATORY_CARE_PROVIDER_SITE_OTHER): Payer: Medicare Other | Admitting: Family Medicine

## 2021-11-07 VITALS — BP 120/76 | HR 65 | Ht 70.0 in | Wt 162.0 lb

## 2021-11-07 DIAGNOSIS — M9904 Segmental and somatic dysfunction of sacral region: Secondary | ICD-10-CM | POA: Insufficient documentation

## 2021-11-07 DIAGNOSIS — R079 Chest pain, unspecified: Secondary | ICD-10-CM

## 2021-11-07 DIAGNOSIS — M545 Low back pain, unspecified: Secondary | ICD-10-CM

## 2021-11-07 DIAGNOSIS — M533 Sacrococcygeal disorders, not elsewhere classified: Secondary | ICD-10-CM | POA: Diagnosis not present

## 2021-11-07 NOTE — Assessment & Plan Note (Signed)
Patient's pain seem to be very localized to the right sacroiliac joint.  Patient started having some nighttime pain.  No radiation of the pain.  Denies any fevers chills or any abnormal weight loss.  We will get x-rays to further evaluate but likely no significant bony abnormality.  Patient given exercises to work on hip abductor strengthening and icing regimen.  Follow-up with me again in 4 to 8 weeks

## 2021-11-07 NOTE — Patient Instructions (Addendum)
Xray today See me at next appt or can move if feeling good

## 2021-11-07 NOTE — Assessment & Plan Note (Signed)
° °  Decision today to treat with OMT was based on Physical Exam  After verbal consent patient was treated with HVLA, ME, FPR techniques in  lumbar and sacral areas, all areas are chronic   Patient tolerated the procedure well with improvement in symptoms  Patient given exercises, stretches and lifestyle modifications  See medications in patient instructions if given  Patient will follow up in 4-8 weeks 

## 2021-11-07 NOTE — Progress Notes (Signed)
[place] Phone: [phone] Subjective:   [name], am serving as a scribe for Dr. Hulan Saas. This visit occurred during the SARS-CoV-2 public health emergency.  Safety protocols were in place, including screening questions prior to the visit, additional usage of staff PPE, and extensive cleaning of exam room while observing appropriate contact time as indicated for disinfecting solutions.  I'm seeing this patient by the request  of:  Shirline Frees, MD  CC: Right-sided low back pain  [id number]  Paul [name] is a 70 y.o. male coming in with complaint of R sided lower back pain. States that his pain has been increasing over past 2 days but first noticed pain 2 months ago. Had to take Aleve last night. Was able to swim for 30 min yesterday. Pain is better with activity. Pain with lumbar flexion. Denies any radiating symptoms.       Past Medical History:  Diagnosis Date   Cataract    Depression    RESOLVED   Hyperlipidemia    Spinal stenosis    WAS HAVING NECK PAIN -PT STATES NERVE ABLATION PROCEDURE 2012 AND PAIN HAS RESOLVED   Spondylitis ([place])    Past Surgical History:  Procedure Laterality Date   APPENDECTOMY  1962   HERNIA REPAIR     INSERTION OF MESH  10/06/2012   Procedure: INSERTION OF MESH;  Surgeon: Imogene Burn. Georgette Dover, MD;  Location: [facility];  Service: General;  Laterality: N/A;   [place]   left   TONSILLECTOMY  7124   UMBILICAL HERNIA REPAIR  10/06/2012   Procedure: HERNIA REPAIR UMBILICAL ADULT;  Surgeon: Imogene Burn. Georgette Dover, MD;  Location: [facility];  Service: General;  Laterality: N/A;   Social History   Socioeconomic History   Marital status: Married    Spouse name: Not on file   Number of children: Not on file   Years of education: Not on file   Highest education level: Not on file  Occupational History   Not on file  Tobacco Use   Smoking status: Never    Smokeless tobacco: Never  Vaping Use   Vaping Use: Never used  Substance and Sexual Activity   Alcohol use: Yes    Comment: OCCAS ALCOHOL   Drug use: No   Sexual activity: Not on file  Other Topics Concern   Not on file  Social History Narrative   ** Merged History Encounter **       Social Determinants of Health   Financial Resource Strain: Not on file  Food Insecurity: Not on file  Transportation Needs: Not on file  Physical Activity: Not on file  Stress: Not on file  Social Connections: Not on file   Allergies  Allergen Reactions   Crestor [Rosuvastatin]     MUSCLE CRAMPS   Lipitor [Atorvastatin]     Liver toxicity   Family History  Problem Relation Age of Onset   Stroke Mother    Heart disease Father    Cancer Sister        bone    Current Outpatient Medications (Endocrine & Metabolic):    predniSONE (DELTASONE) 20 MG tablet, Take 2 tablets (40 mg total) by mouth daily with breakfast.  Current Outpatient Medications (Cardiovascular):    rosuvastatin (CRESTOR) 20 MG tablet, Take 1 tablet (20 mg total) by mouth daily.   nitroGLYCERIN (NITRO-DUR) 0.1 mg/hr patch, 1/4 patch daily to affected area  Current Outpatient Medications (Respiratory):    albuterol (VENTOLIN HFA) 108 (90 Base) MCG/ACT inhaler, Inhale 2 puffs into the lungs every 4 (four) hours as needed for wheezing or shortness of breath.  Current Outpatient Medications (Analgesics):    aspirin 81 MG EC tablet, Take by mouth.  Current Outpatient Medications (Hematological):    cyanocobalamin (,VITAMIN B-12,) 1000 MCG/ML injection, Take by mouth.   ferrous sulfate 325 (65 FE) MG EC tablet, Take 65 mg by mouth daily.   folic acid (FOLVITE) 1 MG tablet, Take 800 mcg by mouth daily.   vitamin B-12 (CYANOCOBALAMIN) 1000 MCG tablet, Take 1,000 mcg by mouth daily.  Current Outpatient Medications (Other):    calcium-vitamin D (OSCAL WITH D) 500-200 MG-UNIT tablet, Take 1 tablet by mouth daily with  breakfast.   cholecalciferol (VITAMIN D3) 10 MCG (400 UNIT) TABS tablet, Take 5,000 Units by mouth daily.   co-enzyme Q-10 30 MG capsule, Take 200 mg by mouth daily.   fish oil-omega-3 fatty acids 1000 MG capsule, Take 2 g by mouth daily.    L-ARGININE-500 PO, Take 400 mg by mouth. Daily   Multiple Vitamins-Minerals (CULTURELLE PROBIOTICS + MULTIV PO), Take by mouth daily.   Probiotic, Lactobacillus, CAPS, Take by mouth.   Quercetin 250 MG TABS, Take 500 mg by mouth daily.   Quercetin Dihydrate POWD, Take by mouth.   vitamin A 10000 UNIT capsule, Take 3,000 Units by mouth daily.   vitamin C (ASCORBIC ACID) 500 MG tablet, Take 500 mg by mouth daily.   gabapentin (NEURONTIN) 100 MG capsule, Take 2 capsules (200 mg total) by mouth at bedtime.   gabapentin (NEURONTIN) 100 MG capsule, Take 2 capsules (200 mg total) by mouth 3 (three) times daily.   Reviewed prior external information including notes and imaging from  primary care provider As well as notes that were available from care everywhere and other healthcare systems.  Past medical history, social, surgical and family history all reviewed in electronic medical record.  No pertanent information unless stated regarding to the chief complaint.   Review of Systems:  No headache, visual changes, nausea, vomiting, diarrhea, constipation, dizziness, abdominal pain, skin rash, fevers, chills, night sweats, weight loss, swollen lymph nodes, body aches, joint swelling, chest pain, shortness of breath, mood changes. POSITIVE muscle aches  Objective  Blood pressure 120/76, pulse 65, height 5\' 10"  (1.778 m), weight 162 lb (73.5 kg), SpO2 97 %.   General: No apparent distress alert and oriented x3 mood and affect normal, dressed appropriately.  HEENT: Pupils equal, extraocular movements intact  Respiratory: Patient's speak in full sentences and does not appear short of breath  Cardiovascular: No lower extremity edema, non tender, no erythema  Low  back exam does show the patient does have tightness noted in the lumbar region.  Severe tenderness over the right sacroiliac joint.  Patient does have significant tightness with Corky Sox bilaterally right greater than left.  5 out of 5 strength of the lower extremity.  Osteopathic findings L5 flexed rotated and side bent left Sacrum right on right    Impression and Recommendations:     The above documentation has been reviewed and is accurate and complete [name], DO

## 2021-11-20 NOTE — Progress Notes (Signed)
Paul [name] [address] [place] Phone: [phone] Subjective:   Paul [name], am serving as a scribe for Dr. Hulan Saas. This visit occurred during the SARS-CoV-2 public health emergency.  Safety protocols were in place, including screening questions prior to the visit, additional usage of staff PPE, and extensive cleaning of exam room while observing appropriate contact time as indicated for disinfecting solutions.   I'm seeing this patient by the request  of:  Shirline Frees, MD  CC: Back and neck pain follow-up  [id number]  Paul [name] is a 71 y.o. male coming in with complaint of back and neck pain. OMT 11/07/2021. Also f/u for L knee pain. Patient states doing well. No new complaints.  Knee does have the ACL tear but does not want to have any surgical intervention at the moment.  Medications patient has been prescribed:   Taking:  Xray lumbar spine 11/07/2021 IMPRESSION: Early degenerative changes.  No acute bony abnormality.    Xray chest 11/07/2021 IMPRESSION: No active cardiopulmonary disease.       Reviewed prior external information including notes and imaging from previsou exam, outside providers and external EMR if available.   As well as notes that were available from care everywhere and other healthcare systems.  Past medical history, social, surgical and family history all reviewed in electronic medical record.  No pertanent information unless stated regarding to the chief complaint.   Past Medical History:  Diagnosis Date   Cataract    Depression    RESOLVED   Hyperlipidemia    Spinal stenosis    WAS HAVING NECK PAIN -PT STATES NERVE ABLATION PROCEDURE 2012 AND PAIN HAS RESOLVED   Spondylitis ([place])     Allergies  Allergen Reactions   Crestor [Rosuvastatin]     MUSCLE CRAMPS   Lipitor [Atorvastatin]     Liver toxicity     Review of Systems:  No headache, visual changes, nausea,  vomiting, diarrhea, constipation, dizziness, abdominal pain, skin rash, fevers, chills, night sweats, weight loss, swollen lymph nodes, body aches, joint swelling, chest pain, shortness of breath, mood changes. POSITIVE muscle aches  Objective  Blood pressure 138/82, pulse 67, height 5\' 10"  (1.778 m), weight 164 lb (74.4 kg), SpO2 95 %.   General: No apparent distress alert and oriented x3 mood and affect normal, dressed appropriately.  HEENT: Pupils equal, extraocular movements intact  Respiratory: Patient's speak in full sentences and does not appear short of breath  Cardiovascular: No lower extremity edema, non tender, no erythema  Deferred the knee exam. Low back exam does have some loss of lordosis.  Some tenderness to palpation in the paraspinal musculature.  Also tightness noted in the parascapular region as well.  Left leg does have tightness with straight leg test but no true radicular symptoms today.  Osteopathic findings  C6 flexed rotated and side bent left T3 extended rotated and side bent right inhaled rib T9 extended rotated and side bent left L2 flexed rotated and side bent right Sacrum left on left       Assessment and Plan:  Left lumbar radiculopathy Patient likely is doing relatively well.  Has responded well to osteopathic manipulation.  Patient is no longer taking the gabapentin.  Continue patient to be active though.  Has responded well to the manipulation and is increasing activity.  Follow-up with me again in 6 to 8 weeks   Nonallopathic problems  Decision today to treat with OMT was based  on Physical Exam  After verbal consent patient was treated with HVLA, ME, FPR techniques in cervical, rib, thoracic, lumbar, and sacral  areas  Patient tolerated the procedure well with improvement in symptoms  Patient given exercises, stretches and lifestyle modifications  See medications in patient instructions if given  Patient will follow up in 4-8 weeks       The above documentation has been reviewed and is accurate and complete Paul Pulley, DO       Note: This dictation was prepared with Dragon dictation along with smaller phrase technology. Any transcriptional errors that result from this process are unintentional.

## 2021-11-21 ENCOUNTER — Encounter: Payer: Self-pay | Admitting: Family Medicine

## 2021-11-21 ENCOUNTER — Ambulatory Visit (INDEPENDENT_AMBULATORY_CARE_PROVIDER_SITE_OTHER): Payer: Medicare Other | Admitting: Family Medicine

## 2021-11-21 ENCOUNTER — Ambulatory Visit: Payer: Medicare Other | Admitting: Family Medicine

## 2021-11-21 ENCOUNTER — Ambulatory Visit: Payer: Self-pay

## 2021-11-21 ENCOUNTER — Other Ambulatory Visit: Payer: Self-pay

## 2021-11-21 VITALS — BP 138/82 | HR 67 | Ht 70.0 in | Wt 164.0 lb

## 2021-11-21 DIAGNOSIS — M5416 Radiculopathy, lumbar region: Secondary | ICD-10-CM

## 2021-11-21 DIAGNOSIS — M9901 Segmental and somatic dysfunction of cervical region: Secondary | ICD-10-CM | POA: Diagnosis not present

## 2021-11-21 DIAGNOSIS — M9902 Segmental and somatic dysfunction of thoracic region: Secondary | ICD-10-CM | POA: Diagnosis not present

## 2021-11-21 DIAGNOSIS — M1712 Unilateral primary osteoarthritis, left knee: Secondary | ICD-10-CM | POA: Diagnosis not present

## 2021-11-21 DIAGNOSIS — M9903 Segmental and somatic dysfunction of lumbar region: Secondary | ICD-10-CM | POA: Diagnosis not present

## 2021-11-21 DIAGNOSIS — M9904 Segmental and somatic dysfunction of sacral region: Secondary | ICD-10-CM | POA: Diagnosis not present

## 2021-11-21 NOTE — Assessment & Plan Note (Signed)
Patient likely is doing relatively well.  Has responded well to osteopathic manipulation.  Patient is no longer taking the gabapentin.  Continue patient to be active though.  Has responded well to the manipulation and is increasing activity.  Follow-up with me again in 6 to 8 weeks

## 2021-11-21 NOTE — Patient Instructions (Signed)
Good to see you! Continue to watch the knee but should do well Manipulated you again today See you again in 5-6 weeks

## 2021-12-21 NOTE — Progress Notes (Signed)
[facility] Note  12/25/2021     CHIEF COMPLAINT Patient presents for Retina Follow Up    HISTORY OF PRESENT ILLNESS: Paul [name] is a 71 y.o. male who presents to the clinic today for:   HPI     Retina Follow Up   Patient presents with  Other.  In left eye.  Duration of 4 months.  Since onset it is stable.  I, the attending physician,  performed the HPI with the patient and updated documentation appropriately.        Comments   Pt states vision is "good", no new fol or floaters, no gtts      Last edited by Bernarda Caffey, MD on 12/25/2021  9:53 AM.    Pt had cataract sx in November with Dr. Lucita Ferrara. Here for f/u ERM OS  Referring physician: Vevelyn Royals, MD  HISTORICAL INFORMATION:   Selected notes from the MEDICAL RECORD NUMBER Referred by Dr. Lucita Ferrara for retina eval and cataract clearance LEE:  Ocular Hx- PMH-    CURRENT MEDICATIONS: No current outpatient medications on file. (Ophthalmic Drugs)   No current facility-administered medications for this visit. (Ophthalmic Drugs)   Current Outpatient Medications (Other)  Medication Sig   albuterol (VENTOLIN HFA) 108 (90 Base) MCG/ACT inhaler Inhale 2 puffs into the lungs every 4 (four) hours as needed for wheezing or shortness of breath.   aspirin 81 MG EC tablet Take by mouth.   calcium-vitamin D (OSCAL WITH D) 500-200 MG-UNIT tablet Take 1 tablet by mouth daily with breakfast.   cholecalciferol (VITAMIN D3) 10 MCG (400 UNIT) TABS tablet Take 5,000 Units by mouth daily.   co-enzyme Q-10 30 MG capsule Take 200 mg by mouth daily.   cyanocobalamin (,VITAMIN B-12,) 1000 MCG/ML injection Take by mouth.   ferrous sulfate 325 (65 FE) MG EC tablet Take 65 mg by mouth daily.   fish oil-omega-3 fatty acids 1000 MG capsule Take 2 g by mouth daily.    folic acid (FOLVITE) 1 MG tablet Take 800 mcg by mouth daily.   gabapentin (NEURONTIN) 100 MG capsule Take 2 capsules (200 mg  total) by mouth at bedtime.   gabapentin (NEURONTIN) 100 MG capsule Take 2 capsules (200 mg total) by mouth 3 (three) times daily.   L-ARGININE-500 PO Take 400 mg by mouth. Daily   Multiple Vitamins-Minerals (CULTURELLE PROBIOTICS + MULTIV PO) Take by mouth daily.   nitroGLYCERIN (NITRO-DUR) 0.1 mg/hr patch 1/4 patch daily to affected area   predniSONE (DELTASONE) 20 MG tablet Take 2 tablets (40 mg total) by mouth daily with breakfast.   Probiotic, Lactobacillus, CAPS Take by mouth.   Quercetin 250 MG TABS Take 500 mg by mouth daily.   Quercetin Dihydrate POWD Take by mouth.   rosuvastatin (CRESTOR) 20 MG tablet Take 1 tablet (20 mg total) by mouth daily.   vitamin A 10000 UNIT capsule Take 3,000 Units by mouth daily.   vitamin B-12 (CYANOCOBALAMIN) 1000 MCG tablet Take 1,000 mcg by mouth daily.   vitamin C (ASCORBIC ACID) 500 MG tablet Take 500 mg by mouth daily.   No current facility-administered medications for this visit. (Other)   REVIEW OF SYSTEMS: ROS   Positive for: Eyes Negative for: Constitutional, Gastrointestinal, Neurological, Skin, Genitourinary, Musculoskeletal, HENT, Endocrine, Cardiovascular, Respiratory, Psychiatric, Allergic/Imm, Heme/Lymph Last edited by Debbrah Alar, COT on 12/25/2021  9:05 AM.     ALLERGIES Allergies  Allergen Reactions   Crestor [Rosuvastatin]     MUSCLE  CRAMPS   Lipitor [Atorvastatin]     Liver toxicity   PAST MEDICAL HISTORY Past Medical History:  Diagnosis Date   Cataract    Depression    RESOLVED   Hyperlipidemia    Spinal stenosis    WAS HAVING NECK PAIN -PT STATES NERVE ABLATION PROCEDURE 2012 AND PAIN HAS RESOLVED   Spondylitis ([place])    Past Surgical History:  Procedure Laterality Date   APPENDECTOMY  1962   HERNIA REPAIR     INSERTION OF MESH  10/06/2012   Procedure: INSERTION OF MESH;  Surgeon: Imogene Burn. Georgette Dover, MD;  Location: [facility];  Service: General;  Laterality: N/A;   [place]   left    TONSILLECTOMY  1696   UMBILICAL HERNIA REPAIR  10/06/2012   Procedure: HERNIA REPAIR UMBILICAL ADULT;  Surgeon: Imogene Burn. Tsuei, MD;  Location: [facility];  Service: General;  Laterality: N/A;   FAMILY HISTORY Family History  Problem Relation Age of Onset   Stroke Mother    Heart disease Father    Cancer Sister        bone   SOCIAL HISTORY Social History   Tobacco Use   Smoking status: Never   Smokeless tobacco: Never  Vaping Use   Vaping Use: Never used  Substance Use Topics   Alcohol use: Yes    Comment: OCCAS ALCOHOL   Drug use: No       OPHTHALMIC EXAM:  Base Eye Exam     Visual Acuity (Snellen - Linear)       Right Left   Dist [place] 20/20 20/20 -2         Tonometry (Tonopen, 9:11 AM)       Right Left   Pressure 13 13         Pupils       Dark Light Shape React APD   Right 5 4 Round Brisk None   Left 5 4 Round Brisk None         Visual Fields (Counting fingers)       Left Right    Full Full         Extraocular Movement       Right Left    Full, Ortho Full, Ortho         Neuro/Psych     Oriented x3: Yes   Mood/Affect: Normal         Dilation     Both eyes: 1.0% Mydriacyl, 2.5% Phenylephrine @ 9:11 AM           Slit Lamp and Fundus Exam     Slit Lamp Exam       Right Left   Lids/Lashes Dermatochalasis - upper lid Dermatochalasis - upper lid, mild MGD   Conjunctiva/Sclera White and quiet White and quiet   Cornea mild arcus, well healed cataract wound mild arcus, well healed cataract wound   Anterior Chamber deep, clear, narrow temporal angle deep, clear, narrow temporal angle   Iris Round and dilated Round and dilated   Lens Vivity PC IOL in good position, trace Posterior capsular opacification Vivity PC IOL in good position   Anterior Vitreous Vitreous syneresis Vitreous syneresis         Fundus Exam       Right Left   Disc Pink and Sharp, Compact, focal PPP Pink and Sharp, mild tilt   C/D Ratio 0.3 0.3   Macula  Flat, Good foveal reflex, mild RPE mottling, No heme  or edema Flat, blunted foveal reflex, mild ERM with early striae, No heme or edema   Vessels mild tortuousity, mild Copper wiring mild attenuation   Periphery Attached, No heme, No RT/RD Attached, mild pigmented cystoid degeneration, No heme, No RT/RD, flat CR nevus SN to disc (1.25DD)           IMAGING AND PROCEDURES  Imaging and Procedures for 12/25/2021  OCT, Retina - OU - Both Eyes       Right Eye Quality was good. Central Foveal Thickness: 287. Progression has been stable. Findings include normal foveal contour, no IRF, no SRF (Trace ERM).   Left Eye Quality was good. Central Foveal Thickness: 356. Progression has been stable. Findings include abnormal foveal contour, epiretinal membrane, no IRF, no SRF (Mild ERM with blunted foveal contour -- stable from prior).   Notes *Images captured and stored on drive  Diagnosis / Impression:  OD: NFP, no IRF/SRF OS: Mild ERM with blunted foveal contour - stable from prior  Clinical management:  See below  Abbreviations: NFP - Normal foveal profile. CME - cystoid macular edema. PED - pigment epithelial detachment. IRF - intraretinal fluid. SRF - subretinal fluid. EZ - ellipsoid zone. ERM - epiretinal membrane. ORA - outer retinal atrophy. ORT - outer retinal tubulation. SRHM - subretinal hyper-reflective material. IRHM - intraretinal hyper-reflective material            ASSESSMENT/PLAN:    ICD-10-CM   1. Epiretinal membrane (ERM) of left eye  H35.372 OCT, Retina - OU - Both Eyes    2. 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 Combined forms of age-related cataract of both eyes  H25.813      1. Epiretinal membrane, left eye - mild ERM -- stable - BCVA 20/20 OU - asymptomatic, no metamorphopsia - OCT with no significant change in ERM  - no indication for surgery at this time - monitor - f/u 9-12 mos -- DFE/OCT  2. Choroidal Nevus, OS  - 1.25 DD pigmented lesion SN  to disc  - no visual symptoms, SRF or orange pigment  - no drusen  - flat, thickness < 12mm  - discussed findings, prognosis  - monitor  3. Pseudophakia OU  - s/p CE/IOL OU(Vvity lenses OU, Dr. Lucita Ferrara, Nov. '22)  - IOLs in perfect position, doing well  - monitor  Ophthalmic Meds Ordered this visit:  No orders of the defined types were placed in this encounter.    Return for f/u 9-12 months, ERM OS, DFE, OCT.  There are no Patient Instructions on file for this visit.   Explained the diagnoses, plan, and follow up with the patient and they expressed understanding.  Patient expressed understanding of the importance of proper follow up care.   This document serves as a record of services personally performed by Gardiner Sleeper, MD, PhD. It was created on their behalf by San Jetty. Owens Shark, OA an ophthalmic technician. The creation of this record is the provider's dictation and/or activities during the visit.    Electronically signed by: San Jetty. Owens Shark, [place] 02.03.2023 9:57 AM  Gardiner Sleeper, M.D., Ph.D. Diseases & Surgery of the Retina and Vitreous [facility]  I have reviewed the above documentation for accuracy and completeness, and I agree with the above. Gardiner Sleeper, M.D., Ph.D. 12/25/21 9:57 AM   Abbreviations: M myopia (nearsighted); A astigmatism; H hyperopia (farsighted); P presbyopia; Mrx spectacle prescription;  CTL contact lenses;  OD right eye; OS left eye; OU both eyes  XT exotropia; ET esotropia; PEK punctate epithelial keratitis; PEE punctate epithelial erosions; DES dry eye syndrome; MGD meibomian gland dysfunction; ATs artificial tears; PFAT's preservative free artificial tears; [place] nuclear sclerotic cataract; PSC posterior subcapsular cataract; ERM epi-retinal membrane; PVD posterior vitreous detachment; RD retinal detachment; DM diabetes mellitus; DR diabetic retinopathy; NPDR non-proliferative diabetic retinopathy; PDR proliferative  diabetic retinopathy; CSME clinically significant macular edema; DME diabetic macular edema; dbh dot blot hemorrhages; CWS cotton wool spot; POAG primary open angle glaucoma; C/D cup-to-disc ratio; HVF humphrey visual field; GVF goldmann visual field; OCT optical coherence tomography; IOP intraocular pressure; BRVO Branch retinal vein occlusion; CRVO central retinal vein occlusion; CRAO central retinal artery occlusion; BRAO branch retinal artery occlusion; RT retinal tear; SB scleral buckle; PPV pars plana vitrectomy; VH Vitreous hemorrhage; PRP panretinal laser photocoagulation; IVK intravitreal kenalog; VMT vitreomacular traction; MH Macular hole;  NVD neovascularization of the disc; NVE neovascularization elsewhere; AREDS age related eye disease study; ARMD age related macular degeneration; POAG primary open angle glaucoma; EBMD epithelial/anterior basement membrane dystrophy; ACIOL anterior chamber intraocular lens; IOL intraocular lens; PCIOL posterior chamber intraocular lens; Phaco/IOL phacoemulsification with intraocular lens placement; [place] photorefractive keratectomy; LASIK laser assisted in situ keratomileusis; HTN hypertension; DM diabetes mellitus; COPD chronic obstructive pulmonary disease

## 2021-12-25 ENCOUNTER — Ambulatory Visit (INDEPENDENT_AMBULATORY_CARE_PROVIDER_SITE_OTHER): Payer: Medicare Other | Admitting: Ophthalmology

## 2021-12-25 ENCOUNTER — Other Ambulatory Visit: Payer: Self-pay

## 2021-12-25 ENCOUNTER — Encounter (INDEPENDENT_AMBULATORY_CARE_PROVIDER_SITE_OTHER): Payer: Self-pay | Admitting: Ophthalmology

## 2021-12-25 DIAGNOSIS — H35372 Puckering of macula, left eye: Secondary | ICD-10-CM

## 2021-12-25 DIAGNOSIS — H25813 Combined forms of age-related cataract, bilateral: Secondary | ICD-10-CM

## 2021-12-25 DIAGNOSIS — D3132 Benign neoplasm of left choroid: Secondary | ICD-10-CM

## 2021-12-25 DIAGNOSIS — H3581 Retinal edema: Secondary | ICD-10-CM

## 2022-01-01 NOTE — Progress Notes (Signed)
Paul [name] Phone: [phone] Subjective:   Paul [name], am serving as a scribe for Dr. Hulan Saas.  This visit occurred during the SARS-CoV-2 public health emergency.  Safety protocols were in place, including screening questions prior to the visit, additional usage of staff PPE, and extensive cleaning of exam room while observing appropriate contact time as indicated for disinfecting solutions.   I'm seeing this patient by the request  of:  Shirline Frees, MD  CC: Low back pain follow-up  [id number]  Paul [name] is a 71 y.o. male coming in with complaint of back and neck pain. OMT 11/21/2021 and f/u for L knee pain. Patient states that he started running outside this past week. [name] catching like previously.  Patient is happy overall at the moment.  Patient continues to have SI joint pain after riding or running.  Very mild discomfort overall.  Nothing that stopping him from activity.  Medications patient has been prescribed: None  Taking:         Reviewed prior external information including notes and imaging from previsou exam, outside providers and external EMR if available.   As well as notes that were available from care everywhere and other healthcare systems.  Past medical history, social, surgical and family history all reviewed in electronic medical record.  [name] pertanent information unless stated regarding to the chief complaint.   Past Medical History:  Diagnosis Date   Cataract    Depression    RESOLVED   Hyperlipidemia    Spinal stenosis    WAS HAVING NECK PAIN -PT STATES NERVE ABLATION PROCEDURE 2012 AND PAIN HAS RESOLVED   Spondylitis ([place])     Allergies  Allergen Reactions   Crestor [Rosuvastatin]     MUSCLE CRAMPS   Lipitor [Atorvastatin]     Liver toxicity     Review of Systems:  [name] headache, visual changes, nausea, vomiting, diarrhea, constipation, dizziness,  abdominal pain, skin rash, fevers, chills, night sweats, weight loss, swollen lymph nodes, body aches, joint swelling, chest pain, shortness of breath, mood changes. POSITIVE muscle aches  Objective  Blood pressure 118/72, pulse (!) 58, height 5\' 10"  (1.778 m), weight 164 lb (74.4 kg), SpO2 98 %.   General: [name] apparent distress alert and oriented x3 mood and affect normal, dressed appropriately.  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  Responding well to osteopathic manipulation.  Patient is starting to increase his running and hopefully this will continue to make some improvement as well.  Discussed icing regimen, home exercises, which activities to do and which ones to avoid.  Increase activity slowly.  Follow-up again in 6 to 8 weeks otherwise.   Nonallopathic problems  Decision today to treat with OMT was based on Physical Exam  After verbal consent patient was treated with HVLA, ME, FPR techniques in  thoracic, lumbar, and sacral  areas  Patient tolerated the procedure well with improvement in symptoms  Patient given exercises, stretches and lifestyle modifications  See medications in patient instructions if given  Patient will follow  up in 4-8 weeks      The above documentation has been reviewed and is accurate and complete [name], DO        Note: This dictation was prepared with Dragon dictation along with smaller phrase technology. Any transcriptional errors that result from this process are unintentional.

## 2022-01-02 ENCOUNTER — Encounter: Payer: Self-pay | Admitting: Family Medicine

## 2022-01-02 ENCOUNTER — Other Ambulatory Visit: Payer: Self-pay

## 2022-01-02 ENCOUNTER — Ambulatory Visit (INDEPENDENT_AMBULATORY_CARE_PROVIDER_SITE_OTHER): Payer: Medicare Other | Admitting: Family Medicine

## 2022-01-02 VITALS — BP 118/72 | HR 58 | Ht 70.0 in | Wt 164.0 lb

## 2022-01-02 DIAGNOSIS — M9904 Segmental and somatic dysfunction of sacral region: Secondary | ICD-10-CM

## 2022-01-02 DIAGNOSIS — M533 Sacrococcygeal disorders, not elsewhere classified: Secondary | ICD-10-CM | POA: Diagnosis not present

## 2022-01-02 DIAGNOSIS — M9902 Segmental and somatic dysfunction of thoracic region: Secondary | ICD-10-CM | POA: Diagnosis not present

## 2022-01-02 DIAGNOSIS — M9903 Segmental and somatic dysfunction of lumbar region: Secondary | ICD-10-CM

## 2022-01-02 NOTE — Assessment & Plan Note (Signed)
Chronic problem but seems to be stable.  Responding well to osteopathic manipulation.  Patient is starting to increase his running and hopefully this will continue to make some improvement as well.  Discussed icing regimen, home exercises, which activities to do and which ones to avoid.  Increase activity slowly.  Follow-up again in 6 to 8 weeks otherwise.

## 2022-01-02 NOTE — Patient Instructions (Signed)
Good to see you! Overall not too shabby You'll do great with the half marathon See you again in 6 weeks

## 2022-01-16 DIAGNOSIS — Z20828 Contact with and (suspected) exposure to other viral communicable diseases: Secondary | ICD-10-CM | POA: Diagnosis not present

## 2022-01-17 DIAGNOSIS — Z20822 Contact with and (suspected) exposure to covid-19: Secondary | ICD-10-CM | POA: Diagnosis not present

## 2022-02-08 DIAGNOSIS — Z20822 Contact with and (suspected) exposure to covid-19: Secondary | ICD-10-CM | POA: Diagnosis not present

## 2022-02-12 NOTE — Progress Notes (Signed)
?Charlann Boxer D.O. ?[facility] ?[place] ?Phone: [phone] ?Subjective:   ?I, Paul [name], am serving as a scribe for Dr. Hulan Saas. ? ?This visit occurred during the SARS-CoV-2 public health emergency.  Safety protocols were in place, including screening questions prior to the visit, additional usage of staff PPE, and extensive cleaning of exam room while observing appropriate contact time as indicated for disinfecting solutions.  ? ?I'm seeing this patient by the request  of:  Shirline Frees, MD ? ?CC: Back and neck pain follow-up ? ?[id number]  ?RANDOL ZUMSTEIN is a 71 y.o. male coming in with complaint of back and neck pain. OMT 01/02/2022 and f/u for L knee pain. Patient states that he continues to have L knee pain. With biking he will have patellar tendon pain. Running still causes pain in L proximal lateral calf muscle. Ran 1/2 marathon 2 weeks ago and had pain in back of L knee.  ? ?Had dry needling yesterday and feels like his back pain is less but was having issues with R illiacus. Overall, feels he is improving. ? ?Medications patient has been prescribed: None ? ?Taking: ? ? ?  ? ? ? ? ?Reviewed prior external information including notes and imaging from previsou exam, outside providers and external EMR if available.  ? ?As well as notes that were available from care everywhere and other healthcare systems. ? ?Past medical history, social, surgical and family history all reviewed in electronic medical record.  No pertanent information unless stated regarding to the chief complaint.  ? ?Past Medical History:  ?Diagnosis Date  ? Cataract   ? Depression   ? RESOLVED  ? Hyperlipidemia   ? Spinal stenosis   ? WAS HAVING NECK PAIN -PT STATES NERVE ABLATION PROCEDURE 2012 AND PAIN HAS RESOLVED  ? Spondylitis ([place])   ?  ?Allergies  ?Allergen Reactions  ? Crestor [Rosuvastatin]   ?  MUSCLE CRAMPS  ? Lipitor [Atorvastatin]   ?  Liver toxicity  ? ? ? ?Review of  Systems: ? No headache, visual changes, nausea, vomiting, diarrhea, constipation, dizziness, abdominal pain, skin rash, fevers, chills, night sweats, weight loss, swollen lymph nodes, body aches, joint swelling, chest pain, shortness of breath, mood changes. POSITIVE muscle aches ? ?Objective  ?Blood pressure 128/82, pulse (!) 57, height '5\' 10"'$  (1.778 m), weight 161 lb (73 kg), SpO2 99 %. ?  ?General: No apparent distress alert and oriented x3 mood and affect normal, dressed appropriately.  ?HEENT: Pupils equal, extraocular movements intact  ?Respiratory: Patient's speak in full sentences and does not appear short of breath  ?Cardiovascular: No lower extremity edema, non tender, no erythema  ?Back exam does have some mild loss of lordosis.  Patient noted left knee still has some instability noted especially with valgus and varus force.  Tenderness to palpation over the lateral joint space. ? ? ?Limited muscular skeletal ultrasound was performed and interpreted by Hulan Saas, M  ? ?Left knee exam still has a trace effusion noted of the patellofemoral joint.  Patient also has what appears to be a possible parameniscal cyst noted.  This could also be a possible hamstring cyst but very difficult to assess secondary to position.  Still has the degenerative changes of the lateral meniscus noted. ? ?Procedure: Real-time Ultrasound Guided Injection of left parameniscal cyst ?Device: GE Logiq Q7 ?Ultrasound guided injection is preferred based studies that show increased duration, increased effect, greater accuracy, decreased procedural pain, increased response rate, and  decreased cost with ultrasound guided versus blind injection.  ?Verbal informed consent obtained.  ?Time-out conducted.  ?Noted no overlying erythema, induration, or other signs of local infection.  ?Skin prepped in a sterile fashion.  ?Local anesthesia: Topical Ethyl chloride.  ?With sterile technique and under real time ultrasound guidance: With a  21-gauge 1-1/2 inch needle patient was injected with 0.5 cc of 0.5% Marcaine and patient had rupture of the cystic mass region on the lateral posterior aspect of the knee.  Injected 1 cc of 0.5% Marcaine into the area. ?Completed without difficulty  ?Pain immediately resolved suggesting accurate placement of the medication.  ?Advised to call if fevers/chills, erythema, induration, drainage, or persistent bleeding.  ?Impression: Technically successful ultrasound guided injection. ? ? ?  ?Assessment and Plan: ? ?Lateral meniscus tear ?Discussed with patient icing regimen and home exercise, patient did have what appeared to be a small cyst on the posterior lateral aspect that could have been a parameniscal cyst but difficult to assess.  Patient did respond well and had improvement in range of motion.  Patient does have instability of the knee with the ACL tear but patient feels like he is continuing to be able to do relatively well overall.  Has continued to compete and does not want to have any surgical intervention.  Follow-up again in 6 to 8 weeks.  ? ? ?  ? ? ?The above documentation has been reviewed and is accurate and complete [name], DO ? ? ? ?  ? ? Note: This dictation was prepared with Dragon dictation along with smaller phrase technology. Any transcriptional errors that result from this process are unintentional.    ?  ?  ? ?

## 2022-02-13 ENCOUNTER — Ambulatory Visit (INDEPENDENT_AMBULATORY_CARE_PROVIDER_SITE_OTHER): Payer: Medicare Other | Admitting: Family Medicine

## 2022-02-13 ENCOUNTER — Ambulatory Visit: Payer: Medicare Other

## 2022-02-13 VITALS — BP 128/82 | HR 57 | Ht 70.0 in | Wt 161.0 lb

## 2022-02-13 DIAGNOSIS — S83282D Other tear of lateral meniscus, current injury, left knee, subsequent encounter: Secondary | ICD-10-CM | POA: Diagnosis not present

## 2022-02-13 DIAGNOSIS — M25562 Pain in left knee: Secondary | ICD-10-CM

## 2022-02-13 DIAGNOSIS — G8929 Other chronic pain: Secondary | ICD-10-CM

## 2022-02-13 NOTE — Assessment & Plan Note (Signed)
Discussed with patient icing regimen and home exercise, patient did have what appeared to be a small cyst on the posterior lateral aspect that could have been a parameniscal cyst but difficult to assess.  Patient did respond well and had improvement in range of motion.  Patient does have instability of the knee with the ACL tear but patient feels like he is continuing to be able to do relatively well overall.  Has continued to compete and does not want to have any surgical intervention.  Follow-up again in 6 to 8 weeks. ?

## 2022-02-13 NOTE — Patient Instructions (Signed)
Injected cyst today ?See me again in 7-8 weeks ?

## 2022-03-01 DIAGNOSIS — Z20822 Contact with and (suspected) exposure to covid-19: Secondary | ICD-10-CM | POA: Diagnosis not present

## 2022-03-11 DIAGNOSIS — D518 Other vitamin B12 deficiency anemias: Secondary | ICD-10-CM | POA: Diagnosis not present

## 2022-03-11 DIAGNOSIS — E7800 Pure hypercholesterolemia, unspecified: Secondary | ICD-10-CM | POA: Diagnosis not present

## 2022-03-11 DIAGNOSIS — E559 Vitamin D deficiency, unspecified: Secondary | ICD-10-CM | POA: Diagnosis not present

## 2022-03-11 DIAGNOSIS — I2510 Atherosclerotic heart disease of native coronary artery without angina pectoris: Secondary | ICD-10-CM | POA: Diagnosis not present

## 2022-03-11 DIAGNOSIS — Z0000 Encounter for general adult medical examination without abnormal findings: Secondary | ICD-10-CM | POA: Diagnosis not present

## 2022-03-11 DIAGNOSIS — N528 Other male erectile dysfunction: Secondary | ICD-10-CM | POA: Diagnosis not present

## 2022-03-11 DIAGNOSIS — Z125 Encounter for screening for malignant neoplasm of prostate: Secondary | ICD-10-CM | POA: Diagnosis not present

## 2022-03-12 DIAGNOSIS — Z20822 Contact with and (suspected) exposure to covid-19: Secondary | ICD-10-CM | POA: Diagnosis not present

## 2022-03-18 DIAGNOSIS — Z20822 Contact with and (suspected) exposure to covid-19: Secondary | ICD-10-CM | POA: Diagnosis not present

## 2022-03-19 DIAGNOSIS — Z20822 Contact with and (suspected) exposure to covid-19: Secondary | ICD-10-CM | POA: Diagnosis not present

## 2022-03-24 ENCOUNTER — Encounter: Payer: Self-pay | Admitting: Family Medicine

## 2022-03-28 NOTE — Progress Notes (Signed)
?Charlann Boxer D.O. ?[facility] ?[place] ?Phone: [phone] ?Subjective:   ?I, Jacqualin Combes, am serving as a scribe for Dr. Hulan Saas. ? ?This visit occurred during the SARS-CoV-2 public health emergency.  Safety protocols were in place, including screening questions prior to the visit, additional usage of staff PPE, and extensive cleaning of exam room while observing appropriate contact time as indicated for disinfecting solutions.  ? ? ?I'm seeing this patient by the request  of:  Shirline Frees, MD ? ?CC: Left knee exam, neck pain ? ?[id number]  ?02/13/2022 ?Discussed with patient icing regimen and home exercise, patient did have what appeared to be a small cyst on the posterior lateral aspect that could have been a parameniscal cyst but difficult to assess.  Patient did respond well and had improvement in range of motion.  Patient does have instability of the knee with the ACL tear but patient feels like he is continuing to be able to do relatively well overall.  Has continued to compete and does not want to have any surgical intervention.  Follow-up again in 6 to 8 weeks. ? ?Update 03/29/2022 ?TRASHAUN STREIGHT is a 71 y.o. male coming in with complaint of L knee pain. Patient states that his knee pain remains the same. Patient notes pain when going down stairs over lateral aspect. Pain increases when he is running faster. Patient also notes feeling insecure with knee. PRP did help locking of knee with running.  ? ?Also notes chronic pain in R cervical spine despite bike adjustments and stretching.  Still has more tightness than anything else.  Denies any radiation. ? ?  ? ?Past Medical History:  ?Diagnosis Date  ? Cataract   ? Depression   ? RESOLVED  ? Hyperlipidemia   ? Spinal stenosis   ? WAS HAVING NECK PAIN -PT STATES NERVE ABLATION PROCEDURE 2012 AND PAIN HAS RESOLVED  ? Spondylitis ([place])   ? ?Past Surgical History:  ?Procedure Laterality Date  ?  APPENDECTOMY  1962  ? HERNIA REPAIR    ? INSERTION OF MESH  10/06/2012  ? Procedure: INSERTION OF MESH;  Surgeon: Imogene Burn. Georgette Dover, MD;  Location: [facility];  Service: General;  Laterality: N/A;  ? [place]  ? left  ? TONSILLECTOMY  1968  ? UMBILICAL HERNIA REPAIR  10/06/2012  ? Procedure: HERNIA REPAIR UMBILICAL ADULT;  Surgeon: Imogene Burn. Georgette Dover, MD;  Location: [facility];  Service: General;  Laterality: N/A;  ? ?Social History  ? ?Socioeconomic History  ? Marital status: Married  ?  Spouse name: Not on file  ? Number of children: Not on file  ? Years of education: Not on file  ? Highest education level: Not on file  ?Occupational History  ? Not on file  ?Tobacco Use  ? Smoking status: Never  ? Smokeless tobacco: Never  ?Vaping Use  ? Vaping Use: Never used  ?Substance and Sexual Activity  ? Alcohol use: Yes  ?  Comment: OCCAS ALCOHOL  ? Drug use: No  ? Sexual activity: Not on file  ?Other Topics Concern  ? Not on file  ?Social History Narrative  ? ** Merged History Encounter **  ?    ? ?Social Determinants of Health  ? ?Financial Resource Strain: Not on file  ?Food Insecurity: Not on file  ?Transportation Needs: Not on file  ?Physical Activity: Not on file  ?Stress: Not on file  ?Social Connections: Not on file  ? ?  Allergies  ?Allergen Reactions  ? Crestor [Rosuvastatin]   ?  MUSCLE CRAMPS  ? Lipitor [Atorvastatin]   ?  Liver toxicity  ? ?Family History  ?Problem Relation Age of Onset  ? Stroke Mother   ? Heart disease Father   ? Cancer Sister   ?     bone  ? ? ?Current Outpatient Medications (Endocrine & Metabolic):  ?  predniSONE (DELTASONE) 20 MG tablet, Take 2 tablets (40 mg total) by mouth daily with breakfast. ? ?Current Outpatient Medications (Cardiovascular):  ?  rosuvastatin (CRESTOR) 20 MG tablet, Take 1 tablet (20 mg total) by mouth daily. ?  nitroGLYCERIN (NITRO-DUR) 0.1 mg/hr patch, 1/4 patch daily to affected area ? ?Current Outpatient Medications (Respiratory):  ?  albuterol (VENTOLIN HFA)  108 (90 Base) MCG/ACT inhaler, Inhale 2 puffs into the lungs every 4 (four) hours as needed for wheezing or shortness of breath. ? ?Current Outpatient Medications (Analgesics):  ?  aspirin 81 MG EC tablet, Take by mouth. ? ?Current Outpatient Medications (Hematological):  ?  ferrous sulfate 325 (65 FE) MG EC tablet, Take 65 mg by mouth daily. ?  folic acid (FOLVITE) 1 MG tablet, Take 800 mcg by mouth daily. ?  vitamin B-12 (CYANOCOBALAMIN) 1000 MCG tablet, Take 1,000 mcg by mouth daily. ?  cyanocobalamin (,VITAMIN B-12,) 1000 MCG/ML injection, Take by mouth. ? ?Current Outpatient Medications (Other):  ?  calcium-vitamin D (OSCAL WITH D) 500-200 MG-UNIT tablet, Take 1 tablet by mouth daily with breakfast. ?  cholecalciferol (VITAMIN D3) 10 MCG (400 UNIT) TABS tablet, Take 5,000 Units by mouth daily. ?  co-enzyme Q-10 30 MG capsule, Take 200 mg by mouth daily. ?  fish oil-omega-3 fatty acids 1000 MG capsule, Take 2 g by mouth daily.  ?  L-ARGININE-500 PO, Take 400 mg by mouth. Daily ?  Multiple Vitamins-Minerals (CULTURELLE PROBIOTICS + MULTIV PO), Take by mouth daily. ?  Probiotic, Lactobacillus, CAPS, Take by mouth. ?  Quercetin 250 MG TABS, Take 500 mg by mouth daily. ?  Quercetin Dihydrate POWD, Take by mouth. ?  vitamin A 10000 UNIT capsule, Take 3,000 Units by mouth daily. ?  vitamin C (ASCORBIC ACID) 500 MG tablet, Take 500 mg by mouth daily. ?  gabapentin (NEURONTIN) 100 MG capsule, Take 2 capsules (200 mg total) by mouth at bedtime. ?  gabapentin (NEURONTIN) 100 MG capsule, Take 2 capsules (200 mg total) by mouth 3 (three) times daily. ? ? ?Reviewed prior external information including notes and imaging from  ?primary care provider ?As well as notes that were available from care everywhere and other healthcare systems. ? ?Past medical history, social, surgical and family history all reviewed in electronic medical record.  No pertanent information unless stated regarding to the chief complaint.  ? ?Review of  Systems: ? No headache, visual changes, nausea, vomiting, diarrhea, constipation, dizziness, abdominal pain, skin rash, fevers, chills, night sweats, weight loss, swollen lymph nodes, body aches, joint swelling, chest pain, shortness of breath, mood changes. POSITIVE muscle aches ? ?Objective  ?Blood pressure 124/84, pulse (!) 56, height '5\' 10"'$  (1.778 m), weight 158 lb (71.7 kg), SpO2 98 %. ?  ?General: No apparent distress alert and oriented x3 mood and affect normal, dressed appropriately.  ?HEENT: Pupils equal, extraocular movements intact  ?Respiratory: Patient's speak in full sentences and does not appear short of breath  ?Cardiovascular: No lower extremity edema, non tender, no erythema  ?Gait mildly antalgic ?MSK: Mild arthritic changes of multiple joints ?Left knee exam does have  some instability noted with valgus and varus force.  Mild varus deformity noted.  Some laxity of the ACL also noted.  Trace effusion noted of the patellofemoral joint. ?Neck exam has a very mild if no sidebending of the neck bilaterally.  Has only 5 degrees of extension.  Negative Spurling's though.  Tender to palpation noted in the neck and the upper thoracic area ? ?Limited muscular skeletal ultrasound was performed and interpreted by Hulan Saas, M  ?Limited ultrasound of patient's knee shows that there is an effusion noted of the patellofemoral joint with hypoechoic changes.  Patient still has the narrowing noted of the lateral joint space and does have significant degenerative tearing of the lateral meniscus.  No Baker's cyst appreciated today. ?Impression: Knee arthritis with effusion ? ?After informed written and verbal consent, patient was seated on exam table. Left knee was prepped with alcohol swab and utilizing anterolateral approach, patient's left knee space was injected with 4:1  marcaine 0.5%: Kenalog '40mg'$ /dL. Patient tolerated the procedure well without immediate complications. ? ?Osteopathic findings ? ?C2 flexed  rotated and side bent right ?C4 flexed rotated and side bent right ?C6 flexed rotated and side bent right ?T3 extended rotated and side bent right inhaled third rib ? ? ?  ?Impression and Recommendations:  ?

## 2022-03-29 ENCOUNTER — Encounter: Payer: Self-pay | Admitting: Family Medicine

## 2022-03-29 ENCOUNTER — Ambulatory Visit (INDEPENDENT_AMBULATORY_CARE_PROVIDER_SITE_OTHER): Payer: Medicare Other | Admitting: Family Medicine

## 2022-03-29 ENCOUNTER — Ambulatory Visit: Payer: Medicare Other

## 2022-03-29 VITALS — BP 124/84 | HR 56 | Ht 70.0 in | Wt 158.0 lb

## 2022-03-29 DIAGNOSIS — M1712 Unilateral primary osteoarthritis, left knee: Secondary | ICD-10-CM | POA: Diagnosis not present

## 2022-03-29 DIAGNOSIS — M5030 Other cervical disc degeneration, unspecified cervical region: Secondary | ICD-10-CM

## 2022-03-29 DIAGNOSIS — M9901 Segmental and somatic dysfunction of cervical region: Secondary | ICD-10-CM | POA: Diagnosis not present

## 2022-03-29 DIAGNOSIS — M25562 Pain in left knee: Secondary | ICD-10-CM

## 2022-03-29 DIAGNOSIS — G8929 Other chronic pain: Secondary | ICD-10-CM

## 2022-03-29 DIAGNOSIS — M9902 Segmental and somatic dysfunction of thoracic region: Secondary | ICD-10-CM

## 2022-03-29 DIAGNOSIS — M9908 Segmental and somatic dysfunction of rib cage: Secondary | ICD-10-CM

## 2022-03-29 NOTE — Patient Instructions (Addendum)
Salonpas patch to neck ?Injected knee today ?Can consider PRP  ?See me in 5-6 weeks ? ?

## 2022-03-30 NOTE — Assessment & Plan Note (Signed)
Patient has known significant arthritic changes and will need to continue to monitor.  Attempted muscle energy of the neck but had significant difficulty secondary to muscle tightness.  Depending on this may need to consider the possibility of advanced imaging.  Patient will follow-up again in 4 to 6 weeks ?

## 2022-03-30 NOTE — Assessment & Plan Note (Signed)
Has responded to injections previously as well as PRP.  We will continue to monitor otherwise.  No significant Baker's cyst noted today.  Discussed icing regimen and home exercises.  Increase activity slowly.  Follow-up again in 6 to 8 weeks ?

## 2022-04-03 ENCOUNTER — Ambulatory Visit: Payer: Medicare Other | Admitting: Family Medicine

## 2022-04-08 DIAGNOSIS — Z23 Encounter for immunization: Secondary | ICD-10-CM | POA: Diagnosis not present

## 2022-04-22 NOTE — Progress Notes (Unsigned)
[place] Phone: [phone] Subjective:   [name], am serving as a scribe for Dr. Hulan Saas.  I'm seeing this patient by the request  of:  Shirline Frees, MD  CC: neck and back   [id number]  03/29/2022 Has responded to injections previously as well as PRP.  We will continue to monitor otherwise.  No significant Baker's cyst noted today.  Discussed icing regimen and home exercises.  Increase activity slowly.  Follow-up again in 6 to 8 weeks  Patient has known significant arthritic changes and will need to continue to monitor.  Attempted muscle energy of the neck but had significant difficulty secondary to muscle tightness.  Depending on this may need to consider the possibility of advanced imaging.  Patient will follow-up again in 4 to 6 weeks  Updated 04/24/2022 Paul [name] is a 71 y.o. male coming in with complaint of L knee pain. Pain in knee occurs with long runs and downhill.  Neck and shoulder pain have improved.   Yesterday took BP 120/60 as he was feeling light headed. Today BP was 130/68. Patient notes not feeling like himself. Fatigued, lack of motion to jump out and do things. Did have a stomach bug 2 weeks ago. Doing 10-11 hours of training a week. Only change he can think of is earlier this year he discontinued supplements when he was working with nutritionist. Had physical and B12 was ok. Currently takes B12. Does have some stress that increased this year due to his daughter's health.        Past Medical History:  Diagnosis Date   Cataract    Depression    RESOLVED   Hyperlipidemia    Spinal stenosis    WAS HAVING NECK PAIN -PT STATES NERVE ABLATION PROCEDURE 2012 AND PAIN HAS RESOLVED   Spondylitis ([place])    Past Surgical History:  Procedure Laterality Date   APPENDECTOMY  1962   HERNIA REPAIR     INSERTION OF MESH  10/06/2012   Procedure: INSERTION OF MESH;  Surgeon:  Imogene Burn. Georgette Dover, MD;  Location: [facility];  Service: General;  Laterality: N/A;   [place]   left   TONSILLECTOMY  1700   UMBILICAL HERNIA REPAIR  10/06/2012   Procedure: HERNIA REPAIR UMBILICAL ADULT;  Surgeon: Imogene Burn. Georgette Dover, MD;  Location: [facility];  Service: General;  Laterality: N/A;   Social History   Socioeconomic History   Marital status: Married    Spouse name: Not on file   Number of children: Not on file   Years of education: Not on file   Highest education level: Not on file  Occupational History   Not on file  Tobacco Use   Smoking status: Never   Smokeless tobacco: Never  Vaping Use   Vaping Use: Never used  Substance and Sexual Activity   Alcohol use: Yes    Comment: OCCAS ALCOHOL   Drug use: No   Sexual activity: Not on file  Other Topics Concern   Not on file  Social History Narrative   ** Merged History Encounter **       Social Determinants of Health   Financial Resource Strain: Not on file  Food Insecurity: Not on file  Transportation Needs: Not on file  Physical Activity: Not on file  Stress: Not on file  Social Connections: Not on file   Allergies  Allergen Reactions   Crestor [  Rosuvastatin]     MUSCLE CRAMPS   Lipitor [Atorvastatin]     Liver toxicity   Family History  Problem Relation Age of Onset   Stroke Mother    Heart disease Father    Cancer Sister        bone    Current Outpatient Medications (Endocrine & Metabolic):    predniSONE (DELTASONE) 20 MG tablet, Take 2 tablets (40 mg total) by mouth daily with breakfast.  Current Outpatient Medications (Cardiovascular):    rosuvastatin (CRESTOR) 20 MG tablet, Take 1 tablet (20 mg total) by mouth daily.   nitroGLYCERIN (NITRO-DUR) 0.1 mg/hr patch, 1/4 patch daily to affected area  Current Outpatient Medications (Respiratory):    albuterol (VENTOLIN HFA) 108 (90 Base) MCG/ACT inhaler, Inhale 2 puffs into the lungs every 4 (four) hours as needed for wheezing or  shortness of breath.  Current Outpatient Medications (Analgesics):    aspirin 81 MG EC tablet, Take by mouth.  Current Outpatient Medications (Hematological):    ferrous sulfate 325 (65 FE) MG EC tablet, Take 65 mg by mouth daily.   folic acid (FOLVITE) 1 MG tablet, Take 800 mcg by mouth daily.   vitamin B-12 (CYANOCOBALAMIN) 1000 MCG tablet, Take 1,000 mcg by mouth daily.   cyanocobalamin (,VITAMIN B-12,) 1000 MCG/ML injection, Take by mouth.  Current Outpatient Medications (Other):    calcium-vitamin D (OSCAL WITH D) 500-200 MG-UNIT tablet, Take 1 tablet by mouth daily with breakfast.   cholecalciferol (VITAMIN D3) 10 MCG (400 UNIT) TABS tablet, Take 5,000 Units by mouth daily.   co-enzyme Q-10 30 MG capsule, Take 200 mg by mouth daily.   fish oil-omega-3 fatty acids 1000 MG capsule, Take 2 g by mouth daily.    L-ARGININE-500 PO, Take 400 mg by mouth. Daily   Multiple Vitamins-Minerals (CULTURELLE PROBIOTICS + MULTIV PO), Take by mouth daily.   Probiotic, Lactobacillus, CAPS, Take by mouth.   Quercetin 250 MG TABS, Take 500 mg by mouth daily.   Quercetin Dihydrate POWD, Take by mouth.   vitamin A 10000 UNIT capsule, Take 3,000 Units by mouth daily.   vitamin C (ASCORBIC ACID) 500 MG tablet, Take 500 mg by mouth daily.   Vitamin D, Ergocalciferol, (DRISDOL) 1.25 MG (50000 UNIT) CAPS capsule, Take 1 capsule (50,000 Units total) by mouth every 7 (seven) days.   gabapentin (NEURONTIN) 100 MG capsule, Take 2 capsules (200 mg total) by mouth at bedtime.   gabapentin (NEURONTIN) 100 MG capsule, Take 2 capsules (200 mg total) by mouth 3 (three) times daily.    Review of Systems:  No headache, visual changes, nausea, vomiting, diarrhea, constipation, dizziness, abdominal pain, skin rash, fevers, chills, night sweats, weight loss, swollen lymph nodes, body aches, joint swelling, chest pain, shortness of breath, mood changes. POSITIVE muscle aches increasing fatigue.  Objective  Blood  pressure 122/62, pulse 79, height '5\' 10"'$  (1.778 m), weight 152 lb (68.9 kg), SpO2 96 %.   General: No apparent distress alert and oriented x3 mood and affect normal, dressed appropriately.  HEENT: Pupils equal, extraocular movements intact  Respiratory: Patient's speak in full sentences and does not appear short of breath  Cardiovascular: No lower extremity edema, non tender, no erythema  MSK: Patient's left knee still has some instability noted.  No significant swelling noted.    Impression and Recommendations:     The above documentation has been reviewed and is accurate and complete [name], DO

## 2022-04-24 ENCOUNTER — Ambulatory Visit: Payer: Self-pay

## 2022-04-24 ENCOUNTER — Ambulatory Visit (INDEPENDENT_AMBULATORY_CARE_PROVIDER_SITE_OTHER): Payer: Medicare Other | Admitting: Family Medicine

## 2022-04-24 VITALS — BP 122/62 | HR 79 | Ht 70.0 in | Wt 152.0 lb

## 2022-04-24 DIAGNOSIS — M25562 Pain in left knee: Secondary | ICD-10-CM

## 2022-04-24 DIAGNOSIS — G8929 Other chronic pain: Secondary | ICD-10-CM | POA: Diagnosis not present

## 2022-04-24 DIAGNOSIS — M1712 Unilateral primary osteoarthritis, left knee: Secondary | ICD-10-CM

## 2022-04-24 DIAGNOSIS — T733XXD Exhaustion due to excessive exertion, subsequent encounter: Secondary | ICD-10-CM | POA: Diagnosis not present

## 2022-04-24 MED ORDER — VITAMIN D (ERGOCALCIFEROL) 1.25 MG (50000 UNIT) PO CAPS: 50000.0000 [IU] | ORAL_CAPSULE | ORAL | 0 refills | Status: DC

## 2022-04-24 NOTE — Patient Instructions (Signed)
DHEA '50mg'$  daily for 4 weeks then 2 weeks off Once weekly Vit D Restart Probiotic Send update in 2 weeks See me in 5 weeks

## 2022-04-24 NOTE — Assessment & Plan Note (Signed)
Patient does have some fatigue noted.  Discussed icing regimen and home exercises, which activities to do.  Increase activity slowly.  Discussed with patient about different vitamin supplementations that could be more beneficial.  Depending on how patient responds we will discuss otherwise.  Follow-up with me again in 6 to 8 weeks

## 2022-04-24 NOTE — Assessment & Plan Note (Signed)
Arthritic changes in the knee but doing better overall.  Does have a recent July.  Feels like he has made improvement overall and we will make no changes at this time

## 2022-04-30 ENCOUNTER — Encounter: Payer: Self-pay | Admitting: Family Medicine

## 2022-05-07 NOTE — Progress Notes (Unsigned)
Paul [name] Phone: [phone] Subjective:   Paul [name], am serving as a scribe for Dr. Hulan Saas.   I'm seeing this patient by the request  of:  Shirline Frees, MD  CC: Continued and possibly worsening fatigue with exercise  [id number]  04/24/2022 Patient does have some fatigue noted.  Discussed icing regimen and home exercises, which activities to do.  Increase activity slowly.  Discussed with patient about different vitamin supplementations that could be more beneficial.  Depending on how patient responds we will discuss otherwise.  Follow-up with me again in 6 to 8 weeks  Arthritic changes in the knee but doing better overall.  Does have a recent July.  Feels like he has made improvement overall and we will make [name] changes at this time  Updated 05/08/2022 Paul [name] is a 71 y.o. male coming in with complaint of knee pain and fatigue patient has been having 3 for multiple years.  Recently feels like he continues to hit a wall. Patient is the same as last visit. Does not feel as powerful and fatigues easily. Has to stop in middle of workouts due to not having enough energy. Patient feels like he has had symptoms for past 3-4 weeks. Did take 3 days off before going into pool and he still did not have any power.   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 Taking DHEA, D2 and probiotics. Trying C4 as well.  Patient continues to unfortunately not make significant strides with this.  Continues to have difficulty even completing training sessions that would be considered like for him.      Past Medical History:  Diagnosis Date   Cataract    Depression    RESOLVED   Hyperlipidemia    Spinal stenosis    WAS HAVING NECK PAIN -PT STATES NERVE ABLATION PROCEDURE 2012 AND PAIN HAS RESOLVED   Spondylitis ([place])    Past Surgical History:  Procedure Laterality Date   APPENDECTOMY  1962   HERNIA REPAIR      INSERTION OF MESH  10/06/2012   Procedure: INSERTION OF MESH;  Surgeon: Imogene Burn. Georgette Dover, MD;  Location: [facility];  Service: General;  Laterality: N/A;   [place]   left   TONSILLECTOMY  9977   UMBILICAL HERNIA REPAIR  10/06/2012   Procedure: HERNIA REPAIR UMBILICAL ADULT;  Surgeon: Imogene Burn. Georgette Dover, MD;  Location: [facility];  Service: General;  Laterality: N/A;   Social History   Socioeconomic History   Marital status: Married    Spouse name: Not on file   Number of children: Not on file   Years of education: Not on file   Highest education level: Not on file  Occupational History   Not on file  Tobacco Use   Smoking status: Never   Smokeless tobacco: Never  Vaping Use   Vaping Use: Never used  Substance and Sexual Activity   Alcohol use: Yes    Comment: OCCAS ALCOHOL   Drug use: [name]   Sexual activity: Not on file  Other Topics Concern   Not on file  Social History Narrative   ** Merged History Encounter **       Social Determinants of Health   Financial Resource Strain: Not on file  Food Insecurity: Not on file  Transportation Needs: Not on file  Physical Activity: Not on file  Stress: Not on file  Social Connections: Not  on file   Allergies  Allergen Reactions   Crestor [Rosuvastatin]     MUSCLE CRAMPS   Lipitor [Atorvastatin]     Liver toxicity   Family History  Problem Relation Age of Onset   Stroke Mother    Heart disease Father    Cancer Sister        bone    Current Outpatient Medications (Endocrine & Metabolic):    predniSONE (DELTASONE) 20 MG tablet, Take 2 tablets (40 mg total) by mouth daily with breakfast.  Current Outpatient Medications (Cardiovascular):    rosuvastatin (CRESTOR) 20 MG tablet, Take 1 tablet (20 mg total) by mouth daily.   nitroGLYCERIN (NITRO-DUR) 0.1 mg/hr patch, 1/4 patch daily to affected area  Current Outpatient Medications (Respiratory):    albuterol (VENTOLIN HFA) 108 (90 Base) MCG/ACT inhaler, Inhale 2  puffs into the lungs every 4 (four) hours as needed for wheezing or shortness of breath.  Current Outpatient Medications (Analgesics):    aspirin 81 MG EC tablet, Take by mouth.  Current Outpatient Medications (Hematological):    ferrous sulfate 325 (65 FE) MG EC tablet, Take 65 mg by mouth daily.   folic acid (FOLVITE) 1 MG tablet, Take 800 mcg by mouth daily.   vitamin B-12 (CYANOCOBALAMIN) 1000 MCG tablet, Take 1,000 mcg by mouth daily.   cyanocobalamin (,VITAMIN B-12,) 1000 MCG/ML injection, Take by mouth.  Current Outpatient Medications (Other):    calcium-vitamin D (OSCAL WITH D) 500-200 MG-UNIT tablet, Take 1 tablet by mouth daily with breakfast.   cholecalciferol (VITAMIN D3) 10 MCG (400 UNIT) TABS tablet, Take 5,000 Units by mouth daily.   co-enzyme Q-10 30 MG capsule, Take 200 mg by mouth daily.   fish oil-omega-3 fatty acids 1000 MG capsule, Take 2 g by mouth daily.    L-ARGININE-500 PO, Take 400 mg by mouth. Daily   Multiple Vitamins-Minerals (CULTURELLE PROBIOTICS + MULTIV PO), Take by mouth daily.   Probiotic, Lactobacillus, CAPS, Take by mouth.   Quercetin 250 MG TABS, Take 500 mg by mouth daily.   Quercetin Dihydrate POWD, Take by mouth.   vitamin A 10000 UNIT capsule, Take 3,000 Units by mouth daily.   vitamin C (ASCORBIC ACID) 500 MG tablet, Take 500 mg by mouth daily.   Vitamin D, Ergocalciferol, (DRISDOL) 1.25 MG (50000 UNIT) CAPS capsule, Take 1 capsule (50,000 Units total) by mouth every 7 (seven) days.   gabapentin (NEURONTIN) 100 MG capsule, Take 2 capsules (200 mg total) by mouth at bedtime.   gabapentin (NEURONTIN) 100 MG capsule, Take 2 capsules (200 mg total) by mouth 3 (three) times daily.   Reviewed prior external information including notes and imaging from  primary care provider As well as notes that were available from care everywhere and other healthcare systems.  Past medical history, social, surgical and family history all reviewed in electronic  medical record.  [name] pertanent information unless stated regarding to the chief complaint.   Review of Systems:  [name] headache, visual changes, nausea, vomiting, diarrhea, constipation, dizziness, abdominal pain, skin rash, fevers, chills, night sweats, weight loss, swollen lymph nodes, body aches, joint swelling, chest pain, shortness of breath, mood changes. POSITIVE muscle aches, fatigue  Objective  Blood pressure 124/76, pulse 64, height '5\' 10"'$  (1.778 m), weight 152 lb (68.9 kg), SpO2 97 %.   General: [name] apparent distress alert and oriented x3 mood and affect normal, dressed appropriately.  HEENT: Pupils equal, extraocular movements intact  Respiratory: Patient's speak in full sentences and does not appear short  of breath  Cardiovascular: [name] lower extremity edema, non tender, [name] erythema  Arthritic changes but seems very comfortable sitting today.    Impression and Recommendations:

## 2022-05-08 ENCOUNTER — Ambulatory Visit (INDEPENDENT_AMBULATORY_CARE_PROVIDER_SITE_OTHER): Payer: Medicare Other

## 2022-05-08 ENCOUNTER — Encounter: Payer: Self-pay | Admitting: Family Medicine

## 2022-05-08 ENCOUNTER — Ambulatory Visit (INDEPENDENT_AMBULATORY_CARE_PROVIDER_SITE_OTHER): Payer: Medicare Other | Admitting: Family Medicine

## 2022-05-08 VITALS — BP 124/76 | HR 64 | Ht 70.0 in | Wt 152.0 lb

## 2022-05-08 DIAGNOSIS — R079 Chest pain, unspecified: Secondary | ICD-10-CM | POA: Diagnosis not present

## 2022-05-08 DIAGNOSIS — E038 Other specified hypothyroidism: Secondary | ICD-10-CM

## 2022-05-08 DIAGNOSIS — M2550 Pain in unspecified joint: Secondary | ICD-10-CM

## 2022-05-08 DIAGNOSIS — E559 Vitamin D deficiency, unspecified: Secondary | ICD-10-CM

## 2022-05-08 DIAGNOSIS — R7989 Other specified abnormal findings of blood chemistry: Secondary | ICD-10-CM

## 2022-05-08 DIAGNOSIS — R6889 Other general symptoms and signs: Secondary | ICD-10-CM | POA: Diagnosis not present

## 2022-05-08 DIAGNOSIS — N400 Enlarged prostate without lower urinary tract symptoms: Secondary | ICD-10-CM

## 2022-05-08 DIAGNOSIS — T733XXA Exhaustion due to excessive exertion, initial encounter: Secondary | ICD-10-CM | POA: Diagnosis not present

## 2022-05-08 LAB — COMPREHENSIVE METABOLIC PANEL
ALT: 25 U/L (ref 0–53)
AST: 22 U/L (ref 0–37)
Albumin: 4.5 g/dL (ref 3.5–5.2)
Alkaline Phosphatase: 64 U/L (ref 39–117)
BUN: 23 mg/dL (ref 6–23)
CO2: 31 mEq/L (ref 19–32)
Calcium: 9.8 mg/dL (ref 8.4–10.5)
Chloride: 103 mEq/L (ref 96–112)
Creatinine, Ser: 0.88 mg/dL (ref 0.40–1.50)
GFR: 85.81 mL/min (ref >60.00)
Glucose, Bld: 81 mg/dL (ref 70–99)
Potassium: 4.5 mEq/L (ref 3.5–5.1)
Sodium: 140 mEq/L (ref 135–145)
Total Bilirubin: 0.4 mg/dL (ref 0.2–1.2)
Total Protein: 7 g/dL (ref 6.0–8.3)

## 2022-05-08 LAB — CBC WITH DIFFERENTIAL/PLATELET
Basophils Absolute: 0 10*3/uL (ref 0.0–0.1)
Basophils Relative: 0.4 % (ref 0.0–3.0)
Eosinophils Absolute: 0.1 10*3/uL (ref 0.0–0.7)
Eosinophils Relative: 1 % (ref 0.0–5.0)
HCT: 44.5 % (ref 39.0–52.0)
Hemoglobin: 14.9 g/dL (ref 13.0–17.0)
Lymphocytes Relative: 27.2 % (ref 12.0–46.0)
Lymphs Abs: 2.3 10*3/uL (ref 0.7–4.0)
MCHC: 33.4 g/dL (ref 30.0–36.0)
MCV: 95.3 fl (ref 78.0–100.0)
Monocytes Absolute: 0.6 10*3/uL (ref 0.1–1.0)
Monocytes Relative: 7.1 % (ref 3.0–12.0)
Neutro Abs: 5.3 10*3/uL (ref 1.4–7.7)
Neutrophils Relative %: 64.3 % (ref 43.0–77.0)
Platelets: 256 10*3/uL (ref 150.0–400.0)
RBC: 4.67 Mil/uL (ref 4.22–5.81)
RDW: 14.1 % (ref 11.5–15.5)
WBC: 8.3 10*3/uL (ref 4.0–10.5)

## 2022-05-08 LAB — IBC PANEL
Iron: 138 ug/dL (ref 42–165)
Saturation Ratios: 30.5 % (ref 20.0–50.0)
TIBC: 452.2 ug/dL — ABNORMAL HIGH (ref 250.0–450.0)
Transferrin: 323 mg/dL (ref 212.0–360.0)

## 2022-05-08 LAB — C-REACTIVE PROTEIN: CRP: <1 mg/dL (ref 0.5–20.0)

## 2022-05-08 LAB — URIC ACID: Uric Acid, Serum: 6.1 mg/dL (ref 4.0–7.8)

## 2022-05-08 LAB — SEDIMENTATION RATE: Sed Rate: 16 mm/h (ref 0–20)

## 2022-05-08 LAB — FERRITIN: Ferritin: 174.2 ng/mL (ref 22.0–322.0)

## 2022-05-08 LAB — VITAMIN D 25 HYDROXY (VIT D DEFICIENCY, FRACTURES): VITD: 46.14 ng/mL (ref 30.00–100.00)

## 2022-05-08 LAB — CK: Total CK: 100 U/L (ref 7–232)

## 2022-05-08 LAB — TSH: TSH: 1.43 u[IU]/mL (ref 0.35–5.50)

## 2022-05-08 LAB — TESTOSTERONE: Testosterone: 260.97 ng/dL — ABNORMAL LOW (ref 300.00–890.00)

## 2022-05-08 LAB — CORTISOL: Cortisol, Plasma: 5.6 ug/dL

## 2022-05-08 NOTE — Patient Instructions (Addendum)
Labs today Chest xray Will discuss wellbutrin after labs We will be in touch

## 2022-05-08 NOTE — Assessment & Plan Note (Signed)
Patient feels like he continues to do well with his exercise.  We will rule out any overtraining, we will do get laboratory work-up to see if anything else could be potentially contributing as well.  Does have some underlying stress as well and the possibility of restarting Wellbutrin could also be beneficial.  We discussed with patient as well with the history of the coronary artery calcification would we need to consider possible cardiac work-up but I think it is highly unlikely with patient being able to do so much activity otherwise.  Patient is in agreement with the plan.  We will follow-up with me otherwise in 4 to 6 weeks for regular osteopathic ration and discussing some of his other illness.

## 2022-05-09 ENCOUNTER — Encounter: Payer: Self-pay | Admitting: Family Medicine

## 2022-05-11 LAB — PROTEIN ELECTROPHORESIS, SERUM
Albumin ELP: 4.4 g/dL (ref 3.8–4.8)
Alpha 1: 0.2 g/dL (ref 0.2–0.3)
Alpha 2: 0.7 g/dL (ref 0.5–0.9)
Beta 2: 0.4 g/dL (ref 0.2–0.5)
Beta Globulin: 0.5 g/dL (ref 0.4–0.6)
Gamma Globulin: 0.9 g/dL (ref 0.8–1.7)
Total Protein: 7 g/dL (ref 6.1–8.1)

## 2022-05-11 LAB — PTH, INTACT AND CALCIUM
Calcium: 10 mg/dL (ref 8.6–10.3)
PTH: 42 pg/mL (ref 16–77)

## 2022-05-11 LAB — ANTI-NUCLEAR AB-TITER (ANA TITER): ANA Titer 1: 1:80 {titer} — ABNORMAL HIGH

## 2022-05-11 LAB — CALCIUM, IONIZED: Calcium, Ion: 5.1 mg/dL (ref 4.7–5.5)

## 2022-05-11 LAB — RHEUMATOID FACTOR: Rheumatoid fact SerPl-aCnc: <14 IU/mL (ref <14)

## 2022-05-11 LAB — ANA: Anti Nuclear Antibody (ANA): POSITIVE — AB

## 2022-05-11 LAB — ANGIOTENSIN CONVERTING ENZYME: Angiotensin-Converting Enzyme: 17 U/L (ref 9–67)

## 2022-05-11 LAB — CYCLIC CITRUL PEPTIDE ANTIBODY, IGG: Cyclic Citrullin Peptide Ab: <16 UNITS

## 2022-05-15 ENCOUNTER — Ambulatory Visit: Payer: Medicare Other | Admitting: Family Medicine

## 2022-05-29 ENCOUNTER — Encounter: Payer: Self-pay | Admitting: Family Medicine

## 2022-05-30 NOTE — Progress Notes (Signed)
[place] Phone: [phone] Subjective:   [name], am serving as a scribe for Dr. Hulan Saas.  I'm seeing this patient by the request  of:  Shirline Frees, MD  CC: Back pain, neck pain, exercise intolerance  [id number]  05/08/2022 Patient feels like he continues to do well with his exercise.  We will rule out any overtraining, we will do get laboratory work-up to see if anything else could be potentially contributing as well.  Does have some underlying stress as well and the possibility of restarting Wellbutrin could also be beneficial.  We discussed with patient as well with the history of the coronary artery calcification would we need to consider possible cardiac work-up but I think it is highly unlikely with patient being able to do so much activity otherwise.  Patient is in agreement with the plan.  We will follow-up with me otherwise in 4 to 6 weeks for regular osteopathic ration and discussing some of his other illness.  Update 05/31/2022 Paul [name] is a 71 y.o. male coming in with complaint of polyarthralgia. Patient states that he had little to no power in legs on his bike and run. Watts were 10 less as well as HR was 10 bpm less. Concerned with symptoms occurring in legs.   Also c/o increase in neck pain with cycling. Pain at base of skull and mostly on R side.   L knee was swollen after race. Was having hard time flexing knee until is went down. Does still have pain in proximal lateral calf.   Continues to feel brain fog and depressed. More lows than highs.   Concerned with not being competitive at end of August.  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   Past Medical History:  Diagnosis Date   Cataract    Depression    RESOLVED   Hyperlipidemia    Spinal stenosis    WAS HAVING NECK PAIN -PT STATES NERVE ABLATION PROCEDURE 2012 AND PAIN HAS RESOLVED   Spondylitis ([place])    Past Surgical History:  Procedure Laterality Date   APPENDECTOMY  1962   HERNIA REPAIR     INSERTION OF MESH  10/06/2012   Procedure: INSERTION OF MESH;  Surgeon: Imogene Burn. Georgette Dover, MD;  Location: [facility];  Service: General;  Laterality: N/A;   [place]   left   TONSILLECTOMY  0272   UMBILICAL HERNIA REPAIR  10/06/2012   Procedure: HERNIA REPAIR UMBILICAL ADULT;  Surgeon: Imogene Burn. Georgette Dover, MD;  Location: [facility];  Service: General;  Laterality: N/A;   Social History   Socioeconomic History   Marital status: Married    Spouse name: Not on file   Number of children: Not on file   Years of education: Not on file   Highest education level: Not on file  Occupational History   Not on file  Tobacco Use   Smoking status: Never   Smokeless tobacco: Never  Vaping Use   Vaping Use: Never used  Substance and Sexual Activity   Alcohol use: Yes    Comment: OCCAS ALCOHOL   Drug use: No  Sexual activity: Not on file  Other Topics Concern   Not on file  Social History Narrative   ** Merged History Encounter **       Social Determinants of Health   Financial Resource Strain: Not on file  Food Insecurity: Not on file  Transportation Needs: Not on file  Physical Activity: Not on file  Stress: Not on file  Social Connections: Not on file   Allergies  Allergen Reactions   Crestor [Rosuvastatin]     MUSCLE CRAMPS   Lipitor [Atorvastatin]     Liver toxicity   Family History  Problem Relation Age of Onset   Stroke Mother    Heart disease Father    Cancer Sister        bone    Current Outpatient Medications (Endocrine & Metabolic):    predniSONE (DELTASONE) 20 MG tablet, Take 2 tablets (40 mg total) by mouth daily with  breakfast.  Current Outpatient Medications (Cardiovascular):    rosuvastatin (CRESTOR) 20 MG tablet, Take 1 tablet (20 mg total) by mouth daily.   nitroGLYCERIN (NITRO-DUR) 0.1 mg/hr patch, 1/4 patch daily to affected area  Current Outpatient Medications (Respiratory):    albuterol (VENTOLIN HFA) 108 (90 Base) MCG/ACT inhaler, Inhale 2 puffs into the lungs every 4 (four) hours as needed for wheezing or shortness of breath.  Current Outpatient Medications (Analgesics):    aspirin 81 MG EC tablet, Take by mouth.  Current Outpatient Medications (Hematological):    ferrous sulfate 325 (65 FE) MG EC tablet, Take 65 mg by mouth daily.   folic acid (FOLVITE) 1 MG tablet, Take 800 mcg by mouth daily.   vitamin B-12 (CYANOCOBALAMIN) 1000 MCG tablet, Take 1,000 mcg by mouth daily.   cyanocobalamin (,VITAMIN B-12,) 1000 MCG/ML injection, Take by mouth.  Current Outpatient Medications (Other):    calcium-vitamin D (OSCAL WITH D) 500-200 MG-UNIT tablet, Take 1 tablet by mouth daily with breakfast.   cholecalciferol (VITAMIN D3) 10 MCG (400 UNIT) TABS tablet, Take 5,000 Units by mouth daily.   co-enzyme Q-10 30 MG capsule, Take 200 mg by mouth daily.   DULoxetine (CYMBALTA) 20 MG capsule, Take 1 capsule (20 mg total) by mouth daily.   fish oil-omega-3 fatty acids 1000 MG capsule, Take 2 g by mouth daily.    L-ARGININE-500 PO, Take 400 mg by mouth. Daily   Multiple Vitamins-Minerals (CULTURELLE PROBIOTICS + MULTIV PO), Take by mouth daily.   Probiotic, Lactobacillus, CAPS, Take by mouth.   Quercetin 250 MG TABS, Take 500 mg by mouth daily.   Quercetin Dihydrate POWD, Take by mouth.   vitamin A 10000 UNIT capsule, Take 3,000 Units by mouth daily.   vitamin C (ASCORBIC ACID) 500 MG tablet, Take 500 mg by mouth daily.   Vitamin D, Ergocalciferol, (DRISDOL) 1.25 MG (50000 UNIT) CAPS capsule, Take 1 capsule (50,000 Units total) by mouth every 7 (seven) days.   gabapentin (NEURONTIN) 100 MG capsule, Take  2 capsules (200 mg total) by mouth at bedtime.   gabapentin (NEURONTIN) 100 MG capsule, Take 2 capsules (200 mg total) by mouth 3 (three) times daily.   Reviewed prior external information including notes and imaging from  primary care provider As well as notes that were available from care everywhere and other healthcare systems.  Past medical history, social, surgical and family history all reviewed in electronic medical record.  No pertanent information unless stated regarding to the chief complaint.   Review of Systems:  No headache, visual changes, nausea, vomiting, diarrhea,  constipation, dizziness, abdominal pain, skin rash, fevers, chills, night sweats, weight loss, swollen lymph nodes,joint swelling, chest pain, shortness of breath, mood changes. POSITIVE muscle aches, body aches, decreased exercise endurance  Objective  Blood pressure 118/72, pulse 87, height '5\' 10"'$  (1.778 m), SpO2 97 %.   General: No apparent distress alert and oriented x3 mood and affect normal, dressed appropriately.  HEENT: Pupils equal, extraocular movements intact  Respiratory: Patient's speak in full sentences and does not appear short of breath  Cardiovascular: No lower extremity edema, non tender, no erythema  Some arthritic changes of multiple joints.  Left leg does seem to be weak with hip flexion, and does seem worsening pain with radicular symptoms of the left side with straight leg test.  Patient is also tender over the sacroiliac joint.  Arthritic changes of the knee noted.  In addition to this patient's neck has significant limited range of motion in extension as well as sidebending bilaterally.  Positive Spurling's even with the right upper extremity.  5 out of 5 strength of the upper extremities noted.    Impression and Recommendations:    The above documentation has been reviewed and is accurate and complete [name], DO

## 2022-05-31 ENCOUNTER — Encounter: Payer: Self-pay | Admitting: Family Medicine

## 2022-05-31 ENCOUNTER — Ambulatory Visit (INDEPENDENT_AMBULATORY_CARE_PROVIDER_SITE_OTHER): Payer: Medicare Other

## 2022-05-31 ENCOUNTER — Ambulatory Visit (INDEPENDENT_AMBULATORY_CARE_PROVIDER_SITE_OTHER): Payer: Medicare Other | Admitting: Family Medicine

## 2022-05-31 VITALS — BP 118/72 | HR 87 | Ht 70.0 in

## 2022-05-31 DIAGNOSIS — M5136 Other intervertebral disc degeneration, lumbar region: Secondary | ICD-10-CM | POA: Diagnosis not present

## 2022-05-31 DIAGNOSIS — M5416 Radiculopathy, lumbar region: Secondary | ICD-10-CM

## 2022-05-31 DIAGNOSIS — M542 Cervicalgia: Secondary | ICD-10-CM

## 2022-05-31 DIAGNOSIS — M545 Low back pain, unspecified: Secondary | ICD-10-CM

## 2022-05-31 DIAGNOSIS — M5030 Other cervical disc degeneration, unspecified cervical region: Secondary | ICD-10-CM

## 2022-05-31 DIAGNOSIS — M2550 Pain in unspecified joint: Secondary | ICD-10-CM | POA: Diagnosis not present

## 2022-05-31 DIAGNOSIS — M50321 Other cervical disc degeneration at C4-C5 level: Secondary | ICD-10-CM | POA: Diagnosis not present

## 2022-05-31 DIAGNOSIS — M50323 Other cervical disc degeneration at C6-C7 level: Secondary | ICD-10-CM | POA: Diagnosis not present

## 2022-05-31 DIAGNOSIS — N400 Enlarged prostate without lower urinary tract symptoms: Secondary | ICD-10-CM | POA: Diagnosis not present

## 2022-05-31 DIAGNOSIS — E538 Deficiency of other specified B group vitamins: Secondary | ICD-10-CM | POA: Diagnosis not present

## 2022-05-31 LAB — PSA: PSA: 3.85 ng/mL (ref 0.10–4.00)

## 2022-05-31 LAB — VITAMIN B12: Vitamin B-12: 433 pg/mL (ref 211–911)

## 2022-05-31 MED ORDER — CYANOCOBALAMIN 1000 MCG/ML IJ SOLN
1000.0000 ug | Freq: Once | INTRAMUSCULAR | Status: AC
  Administered 2022-05-31: 1000 ug via INTRAMUSCULAR

## 2022-05-31 MED ORDER — DULOXETINE HCL 20 MG PO CPEP: 20.0000 mg | ORAL_CAPSULE | Freq: Every day | ORAL | 0 refills | Status: DC

## 2022-05-31 NOTE — Assessment & Plan Note (Signed)
Patient is having some radicular symptoms.  Limited range of motion.  Affecting daily activities.  Patient has had severe amount of pain that has stopped him from activities as well.  At this point I do feel advanced imaging is warranted.  Depending on the findings we will discuss possible epidurals.  Patient is in agreement with the plan.  We will start Cymbalta for pain as well and hopefully this will be beneficial.  Follow-up again in 4 to 6 weeks after the medication starting but we will be discussing different treatment sooner after the imaging

## 2022-05-31 NOTE — Assessment & Plan Note (Signed)
Having some worsening radicular symptoms.  Do feel that an MRI is necessary.  Seems to be more on the left side again.  Discussed that patient could be a candidate for epidurals if needed.  Could be contributing to some of the left leg pain patient is having as well.  Follow-up again in 6 to 8 weeks otherwise

## 2022-05-31 NOTE — Patient Instructions (Signed)
Cervical and lumbar xray Cervical and lumbar MRI Labs that were missed last visit B12 injection after labwork BCAA daily Stop DHEA Cymbalta '20mg'$  Update in 2 weeks See me in 4 weeks

## 2022-06-03 ENCOUNTER — Encounter: Payer: Self-pay | Admitting: Family Medicine

## 2022-06-03 LAB — PROTEIN ELECTROPHORESIS, SERUM
Albumin ELP: 4.3 g/dL (ref 3.8–4.8)
Alpha 1: 0.2 g/dL (ref 0.2–0.3)
Alpha 2: 0.7 g/dL (ref 0.5–0.9)
Beta 2: 0.3 g/dL (ref 0.2–0.5)
Beta Globulin: 0.5 g/dL (ref 0.4–0.6)
Gamma Globulin: 0.9 g/dL (ref 0.8–1.7)
Total Protein: 6.9 g/dL (ref 6.1–8.1)

## 2022-06-10 ENCOUNTER — Ambulatory Visit
Admission: RE | Admit: 2022-06-10 | Discharge: 2022-06-10 | Disposition: A | Discharge status: Home / Self Care | Payer: Medicare Other | Source: Ambulatory Visit | Attending: Family Medicine | Admitting: Family Medicine

## 2022-06-10 DIAGNOSIS — M542 Cervicalgia: Secondary | ICD-10-CM

## 2022-06-10 DIAGNOSIS — M47816 Spondylosis without myelopathy or radiculopathy, lumbar region: Secondary | ICD-10-CM | POA: Diagnosis not present

## 2022-06-10 DIAGNOSIS — M47812 Spondylosis without myelopathy or radiculopathy, cervical region: Secondary | ICD-10-CM | POA: Diagnosis not present

## 2022-06-10 DIAGNOSIS — M48061 Spinal stenosis, lumbar region without neurogenic claudication: Secondary | ICD-10-CM | POA: Diagnosis not present

## 2022-06-10 DIAGNOSIS — M5136 Other intervertebral disc degeneration, lumbar region: Secondary | ICD-10-CM | POA: Diagnosis not present

## 2022-06-10 DIAGNOSIS — M4802 Spinal stenosis, cervical region: Secondary | ICD-10-CM | POA: Diagnosis not present

## 2022-06-10 DIAGNOSIS — M545 Low back pain, unspecified: Secondary | ICD-10-CM

## 2022-06-10 DIAGNOSIS — M5416 Radiculopathy, lumbar region: Secondary | ICD-10-CM | POA: Diagnosis not present

## 2022-06-11 ENCOUNTER — Other Ambulatory Visit: Payer: Self-pay

## 2022-06-11 DIAGNOSIS — M5412 Radiculopathy, cervical region: Secondary | ICD-10-CM

## 2022-06-12 ENCOUNTER — Ambulatory Visit (INDEPENDENT_AMBULATORY_CARE_PROVIDER_SITE_OTHER): Payer: Medicare Other | Admitting: Family Medicine

## 2022-06-12 ENCOUNTER — Ambulatory Visit: Payer: Medicare Other

## 2022-06-12 VITALS — BP 128/68 | HR 78 | Ht 70.0 in | Wt 153.0 lb

## 2022-06-12 DIAGNOSIS — M5030 Other cervical disc degeneration, unspecified cervical region: Secondary | ICD-10-CM

## 2022-06-12 DIAGNOSIS — M9904 Segmental and somatic dysfunction of sacral region: Secondary | ICD-10-CM | POA: Diagnosis not present

## 2022-06-12 DIAGNOSIS — M25562 Pain in left knee: Secondary | ICD-10-CM

## 2022-06-12 DIAGNOSIS — G8929 Other chronic pain: Secondary | ICD-10-CM | POA: Diagnosis not present

## 2022-06-12 DIAGNOSIS — M1712 Unilateral primary osteoarthritis, left knee: Secondary | ICD-10-CM | POA: Diagnosis not present

## 2022-06-12 DIAGNOSIS — M9902 Segmental and somatic dysfunction of thoracic region: Secondary | ICD-10-CM | POA: Diagnosis not present

## 2022-06-12 DIAGNOSIS — M9903 Segmental and somatic dysfunction of lumbar region: Secondary | ICD-10-CM

## 2022-06-12 MED ORDER — DULOXETINE HCL 20 MG PO CPEP: 40.0000 mg | ORAL_CAPSULE | Freq: Every day | ORAL | 0 refills | Status: DC

## 2022-06-12 NOTE — Assessment & Plan Note (Signed)
Chronic problem with exacerbation.  Continues to be able to run and bike on the knee without any significant difficulty but does have some instability from time to time.  Still wants to avoid any type of surgical intervention.  Discussed icing regimen and home exercises otherwise.  Discussed which activities to do and which ones to avoid.  Increase activity slowly over the course of next several weeks.  Patient will follow-up with me again in 6 to 8 weeks otherwise.  Could consider viscosupplementation but has done well with the injections previously.

## 2022-06-12 NOTE — Progress Notes (Signed)
[place] Phone: [phone] Subjective:   [name], am serving as a scribe for Dr. Hulan Saas.  I'm seeing this patient by the request  of:  Shirline Frees, MD  CC: Left knee pain, leg pain follow-up  [id number]  Paul [name] is a 71 y.o. male coming in with complaint of patient had significant work-up last exam.  Patient has findings as detailed below.  Patient continues to have left knee pain, last injection was in May greater than 10 weeks ago.  Known arthritic changes. If patient runs quickly he will have pain in quad tendon, patellar tendon and lateral aspect of the knee. Developing R hip pain that occurs when his knee is bothering him. Limping over the weekend. Dry needling to quad yesterday which was helpful for period of time. No change in shoes, training volume and no new injury.   Does feel like Cymbalta is helping. Has been on it for 2 weeks.     MRI of the cervical spine shows the patient does have a prominent impingement at C5-C6 and patient is scheduled for an epidural July 28.  MRI of the lumbar spine showed some mild degenerative disc disease causing very mild impingement at L4-L5 and L5-S1 but nothing severe.     Past Medical History:  Diagnosis Date   Cataract    Depression    RESOLVED   Hyperlipidemia    Spinal stenosis    WAS HAVING NECK PAIN -PT STATES NERVE ABLATION PROCEDURE 2012 AND PAIN HAS RESOLVED   Spondylitis ([place])    Past Surgical History:  Procedure Laterality Date   APPENDECTOMY  1962   HERNIA REPAIR     INSERTION OF MESH  10/06/2012   Procedure: INSERTION OF MESH;  Surgeon: Imogene Burn. Georgette Dover, MD;  Location: [facility];  Service: General;  Laterality: N/A;   [place]   left   TONSILLECTOMY  9629   UMBILICAL HERNIA REPAIR  10/06/2012   Procedure: HERNIA REPAIR UMBILICAL ADULT;  Surgeon: Imogene Burn. Georgette Dover, MD;  Location: [facility];  Service:  General;  Laterality: N/A;   Social History   Socioeconomic History   Marital status: Married    Spouse name: Not on file   Number of children: Not on file   Years of education: Not on file   Highest education level: Not on file  Occupational History   Not on file  Tobacco Use   Smoking status: Never   Smokeless tobacco: Never  Vaping Use   Vaping Use: Never used  Substance and Sexual Activity   Alcohol use: Yes    Comment: OCCAS ALCOHOL   Drug use: No   Sexual activity: Not on file  Other Topics Concern   Not on file  Social History Narrative   ** Merged History Encounter **       Social Determinants of Health   Financial Resource Strain: Not on file  Food Insecurity: Not on file  Transportation Needs: Not on file  Physical Activity: Not on file  Stress: Not on file  Social Connections: Not on file   Allergies  Allergen Reactions   Crestor [Rosuvastatin]     MUSCLE CRAMPS   Lipitor [Atorvastatin]     Liver toxicity   Family History  Problem Relation Age of Onset   Stroke Mother    Heart disease Father    Cancer Sister  bone    Current Outpatient Medications (Endocrine & Metabolic):    predniSONE (DELTASONE) 20 MG tablet, Take 2 tablets (40 mg total) by mouth daily with breakfast.  Current Outpatient Medications (Cardiovascular):    rosuvastatin (CRESTOR) 20 MG tablet, Take 1 tablet (20 mg total) by mouth daily.   nitroGLYCERIN (NITRO-DUR) 0.1 mg/hr patch, 1/4 patch daily to affected area  Current Outpatient Medications (Respiratory):    albuterol (VENTOLIN HFA) 108 (90 Base) MCG/ACT inhaler, Inhale 2 puffs into the lungs every 4 (four) hours as needed for wheezing or shortness of breath.  Current Outpatient Medications (Analgesics):    aspirin 81 MG EC tablet, Take by mouth.  Current Outpatient Medications (Hematological):    ferrous sulfate 325 (65 FE) MG EC tablet, Take 65 mg by mouth daily.   folic acid (FOLVITE) 1 MG tablet, Take 800 mcg  by mouth daily.   vitamin B-12 (CYANOCOBALAMIN) 1000 MCG tablet, Take 1,000 mcg by mouth daily.   cyanocobalamin (,VITAMIN B-12,) 1000 MCG/ML injection, Take by mouth.  Current Outpatient Medications (Other):    calcium-vitamin D (OSCAL WITH D) 500-200 MG-UNIT tablet, Take 1 tablet by mouth daily with breakfast.   cholecalciferol (VITAMIN D3) 10 MCG (400 UNIT) TABS tablet, Take 5,000 Units by mouth daily.   co-enzyme Q-10 30 MG capsule, Take 200 mg by mouth daily.   DULoxetine (CYMBALTA) 20 MG capsule, Take 2 capsules (40 mg total) by mouth daily.   fish oil-omega-3 fatty acids 1000 MG capsule, Take 2 g by mouth daily.    L-ARGININE-500 PO, Take 400 mg by mouth. Daily   Multiple Vitamins-Minerals (CULTURELLE PROBIOTICS + MULTIV PO), Take by mouth daily.   Probiotic, Lactobacillus, CAPS, Take by mouth.   Quercetin 250 MG TABS, Take 500 mg by mouth daily.   Quercetin Dihydrate POWD, Take by mouth.   vitamin A 10000 UNIT capsule, Take 3,000 Units by mouth daily.   vitamin C (ASCORBIC ACID) 500 MG tablet, Take 500 mg by mouth daily.   Vitamin D, Ergocalciferol, (DRISDOL) 1.25 MG (50000 UNIT) CAPS capsule, Take 1 capsule (50,000 Units total) by mouth every 7 (seven) days.   gabapentin (NEURONTIN) 100 MG capsule, Take 2 capsules (200 mg total) by mouth at bedtime.   gabapentin (NEURONTIN) 100 MG capsule, Take 2 capsules (200 mg total) by mouth 3 (three) times daily.   Reviewed prior external information including notes and imaging from  primary care provider As well as notes that were available from care everywhere and other healthcare systems.  Past medical history, social, surgical and family history all reviewed in electronic medical record.  No pertanent information unless stated regarding to the chief complaint.   Review of Systems:  No headache, visual changes, nausea, vomiting, diarrhea, constipation, dizziness, abdominal pain, skin rash, fevers, chills, night sweats, weight loss,  swollen lymph nodes, body aches, joint swelling, chest pain, shortness of breath, mood changes. POSITIVE muscle aches  Objective  Blood pressure 128/68, pulse 78, height '5\' 10"'$  (1.778 m), weight 153 lb (69.4 kg), SpO2 96 %.   General: No apparent distress alert and oriented x3 mood and affect normal, dressed appropriately.  HEENT: Pupils equal, extraocular movements intact  Respiratory: Patient's speak in full sentences and does not appear short of breath  Cardiovascular: No lower extremity edema, non tender, no erythema  Patient's left knee does have some instability noted with valgus and varus force.  Trace effusion noted as well.  Patient does have tenderness to palpation more over the lateral joint line.  Low back exam does have some loss of lordosis.  Some tenderness to palpation over the paraspinal musculature.  Patient does have tightness with FABER test bilaterally.  Tightness in the thoracolumbar juncture as well.   Osteopathic findings  T3 extended rotated and side bent right inhaled third rib T6 extended rotated and side bent left L2 flexed rotated and side bent right L5 flexed rotated and side bent left Sacrum right on right   After informed written and verbal consent, patient was seated on exam table. Left knee was prepped with alcohol swab and utilizing anterolateral approach, patient's left knee space was injected with 4:1  marcaine 0.5%: Kenalog '40mg'$ /dL. Patient tolerated the procedure well without immediate complications.     Impression and Recommendations:    The above documentation has been reviewed and is accurate and complete [name], DO

## 2022-06-12 NOTE — Patient Instructions (Addendum)
Injected R knee today Cymbalta '40mg'$   Exercise again on Monday after epidural Give me an update in 1 weeks See me in 5 weeks

## 2022-06-12 NOTE — Assessment & Plan Note (Signed)
Patient is getting an epidural of the neck in the near future.

## 2022-06-12 NOTE — Assessment & Plan Note (Signed)
  Decision today to treat with OMT was based on Physical Exam  After verbal consent patient was treated with HVLA, ME, FPR techniques in  thoracic, rib, lumbar and sacral areas, all areas are chronic   Patient tolerated the procedure well with improvement in symptoms  Patient given exercises, stretches and lifestyle modifications  See medications in patient instructions if given  Patient will follow up in 4-8 weeks 

## 2022-06-14 ENCOUNTER — Ambulatory Visit
Admission: RE | Admit: 2022-06-14 | Discharge: 2022-06-14 | Disposition: A | Discharge status: Home / Self Care | Payer: Medicare Other | Source: Ambulatory Visit | Attending: Family Medicine | Admitting: Family Medicine

## 2022-06-14 DIAGNOSIS — M47812 Spondylosis without myelopathy or radiculopathy, cervical region: Secondary | ICD-10-CM | POA: Diagnosis not present

## 2022-06-14 DIAGNOSIS — M5412 Radiculopathy, cervical region: Secondary | ICD-10-CM

## 2022-06-14 MED ORDER — TRIAMCINOLONE ACETONIDE 40 MG/ML IJ SUSP (RADIOLOGY)
60.0000 mg | Freq: Once | INTRAMUSCULAR | Status: AC
  Administered 2022-06-14: 60 mg via EPIDURAL

## 2022-06-14 MED ORDER — IOPAMIDOL (ISOVUE-M 300) INJECTION 61%
1.0000 mL | Freq: Once | INTRAMUSCULAR | Status: AC
  Administered 2022-06-14: 1 mL via EPIDURAL

## 2022-06-14 NOTE — Discharge Instructions (Signed)

## 2022-06-25 ENCOUNTER — Ambulatory Visit: Payer: Medicare Other | Admitting: Family Medicine

## 2022-06-25 ENCOUNTER — Encounter: Payer: Self-pay | Admitting: Family Medicine

## 2022-06-26 ENCOUNTER — Ambulatory Visit: Payer: Medicare Other | Admitting: Family Medicine

## 2022-06-28 NOTE — Progress Notes (Unsigned)
[place] Phone: [phone] Subjective:   Paul [name], am serving as a scribe for Dr. Hulan Saas.  I'm seeing this patient by the request  of:  Shirline Frees, MD  CC: Neck exam follow-up, right knee injury, back pain  [id number]  06/12/2022 Chronic problem with exacerbation.  Continues to be able to run and bike on the knee without any significant difficulty but does have some instability from time to time.  Still wants to avoid any type of surgical intervention.  Discussed icing regimen and home exercises otherwise.  Discussed which activities to do and which ones to avoid.  Increase activity slowly over the course of next several weeks.  Patient will follow-up with me again in 6 to 8 weeks otherwise.  Could consider viscosupplementation but has done well with the injections previously.  Update 07/01/2022 Paul [name] is a 71 y.o. male coming in with complaint of L knee pain. Patient states that his knee pain is the same as last visit. Pain with stairs, hills, kicking, and pressing down on pedal. Pain over anterior aspect Ran 6 miles today but he was on flat track.   For a few months, he has had R lumbar spine pain. Dry needling has not helped. Felt pain during his run. Denies any radiating symptoms. Pain will get better and then get worse. Able to get off his bike and run without pain but if he just goes out for a run his pain will increase in this area.      Past Medical History:  Diagnosis Date   Cataract    Depression    RESOLVED   Hyperlipidemia    Spinal stenosis    WAS HAVING NECK PAIN -PT STATES NERVE ABLATION PROCEDURE 2012 AND PAIN HAS RESOLVED   Spondylitis ([place])    Past Surgical History:  Procedure Laterality Date   APPENDECTOMY  1962   HERNIA REPAIR     INSERTION OF MESH  10/06/2012   Procedure: INSERTION OF MESH;  Surgeon: Imogene Burn. Georgette Dover, MD;  Location: [facility];  Service:  General;  Laterality: N/A;   [place]   left   TONSILLECTOMY  9528   UMBILICAL HERNIA REPAIR  10/06/2012   Procedure: HERNIA REPAIR UMBILICAL ADULT;  Surgeon: Imogene Burn. Georgette Dover, MD;  Location: [facility];  Service: General;  Laterality: N/A;   Social History   Socioeconomic History   Marital status: Married    Spouse name: Not on file   Number of children: Not on file   Years of education: Not on file   Highest education level: Not on file  Occupational History   Not on file  Tobacco Use   Smoking status: Never   Smokeless tobacco: Never  Vaping Use   Vaping Use: Never used  Substance and Sexual Activity   Alcohol use: Yes    Comment: OCCAS ALCOHOL   Drug use: [name]   Sexual activity: Not on file  Other Topics Concern   Not on file  Social History Narrative   ** Merged History Encounter **       Social Determinants of Health   Financial Resource Strain: Not on file  Food Insecurity: Not on file  Transportation Needs: Not on file  Physical Activity: Not on file  Stress: Not on file  Social Connections: Not on file   Allergies  Allergen Reactions   Crestor [Rosuvastatin]  MUSCLE CRAMPS   Lipitor [Atorvastatin]     Liver toxicity   Family History  Problem Relation Age of Onset   Stroke Mother    Heart disease Father    Cancer Sister        bone    Current Outpatient Medications (Endocrine & Metabolic):    predniSONE (DELTASONE) 20 MG tablet, Take 2 tablets (40 mg total) by mouth daily with breakfast.  Current Outpatient Medications (Cardiovascular):    rosuvastatin (CRESTOR) 20 MG tablet, Take 1 tablet (20 mg total) by mouth daily.   nitroGLYCERIN (NITRO-DUR) 0.1 mg/hr patch, 1/4 patch daily to affected area  Current Outpatient Medications (Respiratory):    albuterol (VENTOLIN HFA) 108 (90 Base) MCG/ACT inhaler, Inhale 2 puffs into the lungs every 4 (four) hours as needed for wheezing or shortness of breath.  Current Outpatient Medications  (Analgesics):    aspirin 81 MG EC tablet, Take by mouth.  Current Outpatient Medications (Hematological):    ferrous sulfate 325 (65 FE) MG EC tablet, Take 65 mg by mouth daily.   folic acid (FOLVITE) 1 MG tablet, Take 800 mcg by mouth daily.   vitamin B-12 (CYANOCOBALAMIN) 1000 MCG tablet, Take 1,000 mcg by mouth daily.   cyanocobalamin (,VITAMIN B-12,) 1000 MCG/ML injection, Take by mouth.  Current Outpatient Medications (Other):    calcium-vitamin D (OSCAL WITH D) 500-200 MG-UNIT tablet, Take 1 tablet by mouth daily with breakfast.   cholecalciferol (VITAMIN D3) 10 MCG (400 UNIT) TABS tablet, Take 5,000 Units by mouth daily.   co-enzyme Q-10 30 MG capsule, Take 200 mg by mouth daily.   DULoxetine (CYMBALTA) 20 MG capsule, Take 2 capsules (40 mg total) by mouth daily.   fish oil-omega-3 fatty acids 1000 MG capsule, Take 2 g by mouth daily.    L-ARGININE-500 PO, Take 400 mg by mouth. Daily   Multiple Vitamins-Minerals (CULTURELLE PROBIOTICS + MULTIV PO), Take by mouth daily.   Probiotic, Lactobacillus, CAPS, Take by mouth.   Quercetin 250 MG TABS, Take 500 mg by mouth daily.   Quercetin Dihydrate POWD, Take by mouth.   vitamin A 10000 UNIT capsule, Take 3,000 Units by mouth daily.   vitamin C (ASCORBIC ACID) 500 MG tablet, Take 500 mg by mouth daily.   gabapentin (NEURONTIN) 100 MG capsule, Take 2 capsules (200 mg total) by mouth at bedtime.   gabapentin (NEURONTIN) 100 MG capsule, Take 2 capsules (200 mg total) by mouth 3 (three) times daily.   Vitamin D, Ergocalciferol, (DRISDOL) 1.25 MG (50000 UNIT) CAPS capsule, Take 1 capsule (50,000 Units total) by mouth every 7 (seven) days.   Reviewed prior external information including notes and imaging from  primary care provider As well as notes that were available from care everywhere and other healthcare systems.  Past medical history, social, surgical and family history all reviewed in electronic medical record.  [name] pertanent  information unless stated regarding to the chief complaint.   Review of Systems:  [name] headache, visual changes, nausea, vomiting, diarrhea, constipation, dizziness, abdominal pain, skin rash, fevers, chills, night sweats, weight loss, swollen lymph nodes, body aches, joint swelling, chest pain, shortness of breath, mood changes. POSITIVE muscle aches  Objective  Blood pressure 112/82, pulse 72, height '5\' 10"'$  (1.778 m), weight 149 lb (67.6 kg), SpO2 98 %.   General: [name] apparent distress alert and oriented x3 mood and affect normal, dressed appropriately.  HEENT: Pupils equal, extraocular movements intact  Respiratory: Patient's speak in full sentences and does not appear short of  breath  Cardiovascular: [name] lower extremity edema, non tender, [name] erythema  Right knee does have some crepitus noted.  Trace effusion noted of the patellofemoral joint.  Worsening pain with resisted flexion and extension of the knee against resistance.  Low back exam severely tender to palpation over the sacroiliac joint.  Right greater than left.  Positive FABER test.  Patient does have improvement in range of motion of the cervical neck.  Limited muscular skeletal ultrasound was performed and interpreted by Hulan Saas, M  Limited ultrasound shows that patient does have hypoechoic changes in the patellofemoral joint noted.  Seems to be tender to palpation with compression.  Patient also has an area of hypoechoic changes that appears to be a potential quadricep injury.  [name] defect noted.  Mild increase in neovascularization. 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  After informed written and verbal consent, patient was seated on exam table. Right knee was prepped with alcohol swab and utilizing anterolateral approach, patient's right knee space was injected with 4:1  marcaine 0.5%: Kenalog '40mg'$ /dL. Patient tolerated the procedure well without immediate  complications.  Osteopathic findings  T9 extended rotated and side bent left L2 flexed rotated and side bent right Sacrum right on right     Impression and Recommendations:     The above documentation has been reviewed and is accurate and complete [name], DO

## 2022-07-01 ENCOUNTER — Ambulatory Visit (INDEPENDENT_AMBULATORY_CARE_PROVIDER_SITE_OTHER): Payer: Medicare Other | Admitting: Family Medicine

## 2022-07-01 ENCOUNTER — Ambulatory Visit: Payer: Medicare Other

## 2022-07-01 ENCOUNTER — Other Ambulatory Visit: Payer: Self-pay

## 2022-07-01 VITALS — BP 112/82 | HR 72 | Ht 70.0 in | Wt 149.0 lb

## 2022-07-01 DIAGNOSIS — M9902 Segmental and somatic dysfunction of thoracic region: Secondary | ICD-10-CM | POA: Diagnosis not present

## 2022-07-01 DIAGNOSIS — M533 Sacrococcygeal disorders, not elsewhere classified: Secondary | ICD-10-CM

## 2022-07-01 DIAGNOSIS — M25562 Pain in left knee: Secondary | ICD-10-CM

## 2022-07-01 DIAGNOSIS — G8929 Other chronic pain: Secondary | ICD-10-CM

## 2022-07-01 DIAGNOSIS — M5030 Other cervical disc degeneration, unspecified cervical region: Secondary | ICD-10-CM | POA: Diagnosis not present

## 2022-07-01 DIAGNOSIS — M1711 Unilateral primary osteoarthritis, right knee: Secondary | ICD-10-CM | POA: Insufficient documentation

## 2022-07-01 DIAGNOSIS — S76109A Unspecified injury of unspecified quadriceps muscle, fascia and tendon, initial encounter: Secondary | ICD-10-CM | POA: Diagnosis not present

## 2022-07-01 DIAGNOSIS — M9903 Segmental and somatic dysfunction of lumbar region: Secondary | ICD-10-CM

## 2022-07-01 DIAGNOSIS — M9904 Segmental and somatic dysfunction of sacral region: Secondary | ICD-10-CM | POA: Diagnosis not present

## 2022-07-01 MED ORDER — ALBUTEROL SULFATE HFA 108 (90 BASE) MCG/ACT IN AERS
2.0000 | INHALATION_SPRAY | RESPIRATORY_TRACT | 3 refills | Status: DC | PRN
Start: 2022-07-01 — End: 2023-05-11

## 2022-07-01 MED ORDER — VITAMIN D (ERGOCALCIFEROL) 1.25 MG (50000 UNIT) PO CAPS
50000.0000 [IU] | ORAL_CAPSULE | ORAL | 0 refills | Status: DC
Start: 2022-07-01 — End: 2022-09-25

## 2022-07-01 NOTE — Patient Instructions (Signed)
Thigh compression sleeve Injected knee today Vit D once weekly and albuterol  See me again in 6 weeks

## 2022-07-01 NOTE — Assessment & Plan Note (Signed)
Injection given today, tolerated the procedure well, discussed icing regimen and home exercises, discussed which activities to do and which ones to avoid.  Increase activity slowly over the course the next several weeks.  Follow-up again in 6 to 8 weeks.

## 2022-07-01 NOTE — Assessment & Plan Note (Signed)
Chronic problem with exacerbation.  Responding extremely well to osteopathic manipulation.  Discussed posture and ergonomics otherwise.  Increase activity slowly.  Follow-up again in 6 to 8 weeks

## 2022-07-01 NOTE — Assessment & Plan Note (Signed)
On.  Patient does have a very small quadricep strain versus tear noted.  Discussed icing regimen and home exercises, discussed which activities to do and which ones to avoid, compression sleeve.  Patient does have a race next week and I do think will be able to do relatively well.  Continue the Cymbalta.  Neck pain is improved as well.

## 2022-07-01 NOTE — Assessment & Plan Note (Signed)
Significant improvement after the epidural.  Continue conservative therapy

## 2022-07-03 ENCOUNTER — Encounter: Payer: Self-pay | Admitting: Family Medicine

## 2022-07-05 ENCOUNTER — Telehealth: Payer: Self-pay | Admitting: Family Medicine

## 2022-07-05 MED ORDER — MELOXICAM 15 MG PO TABS: 15.0000 mg | ORAL_TABLET | Freq: Every day | ORAL | 0 refills | Status: DC

## 2022-07-05 NOTE — Telephone Encounter (Signed)
Refill done.  

## 2022-07-05 NOTE — Telephone Encounter (Signed)
Yes I am fine with refill for him

## 2022-07-05 NOTE — Telephone Encounter (Signed)
Patient called asking if Dr Tamala Julian would be able to send in a prescription for Meloxicam to [facility] on [address]?  He said this would be a new rx for him.  Hoping to have it before his trip.

## 2022-07-07 ENCOUNTER — Other Ambulatory Visit: Payer: Self-pay | Admitting: Family Medicine

## 2022-07-16 IMAGING — DX DG KNEE 3 VIEWS*L*
3 series · 3 of 3 positions shown · non-contrast
Comparison: None.

CLINICAL DATA: Lateral left calf and knee pain after running

EXAM:
LEFT KNEE - 3 VIEW

[knee ap]
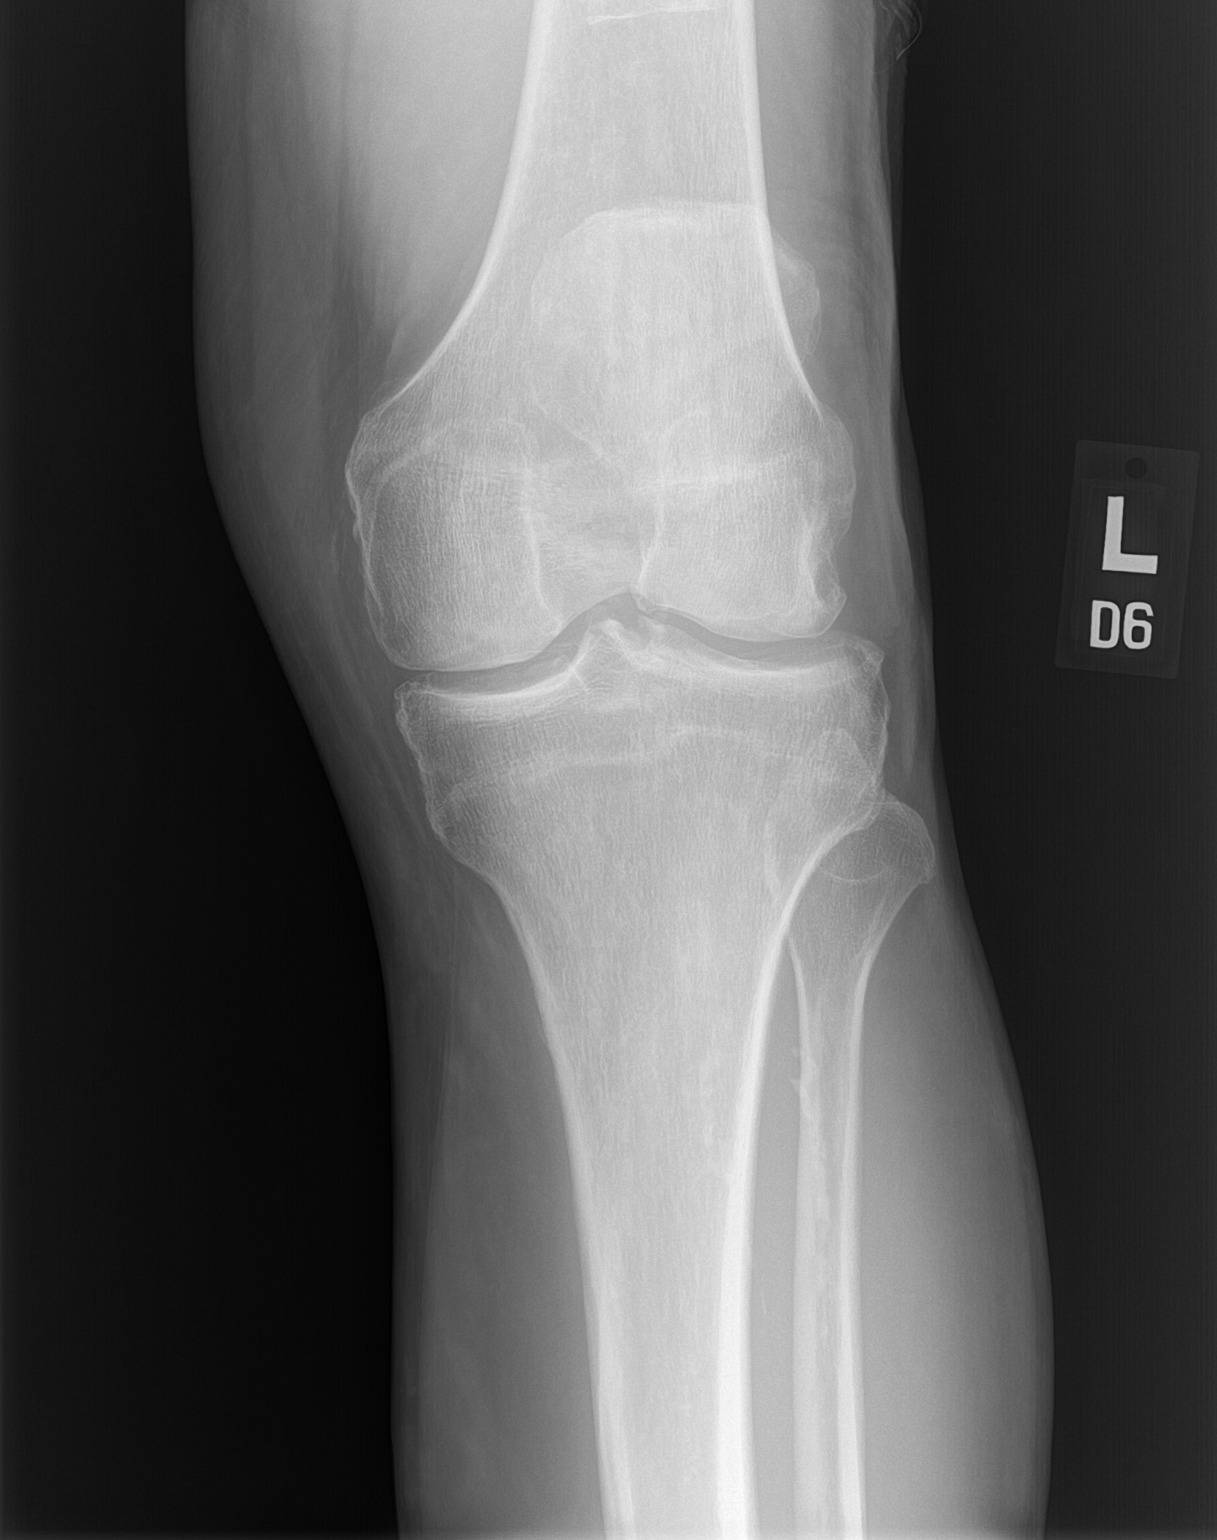

[knee lat]
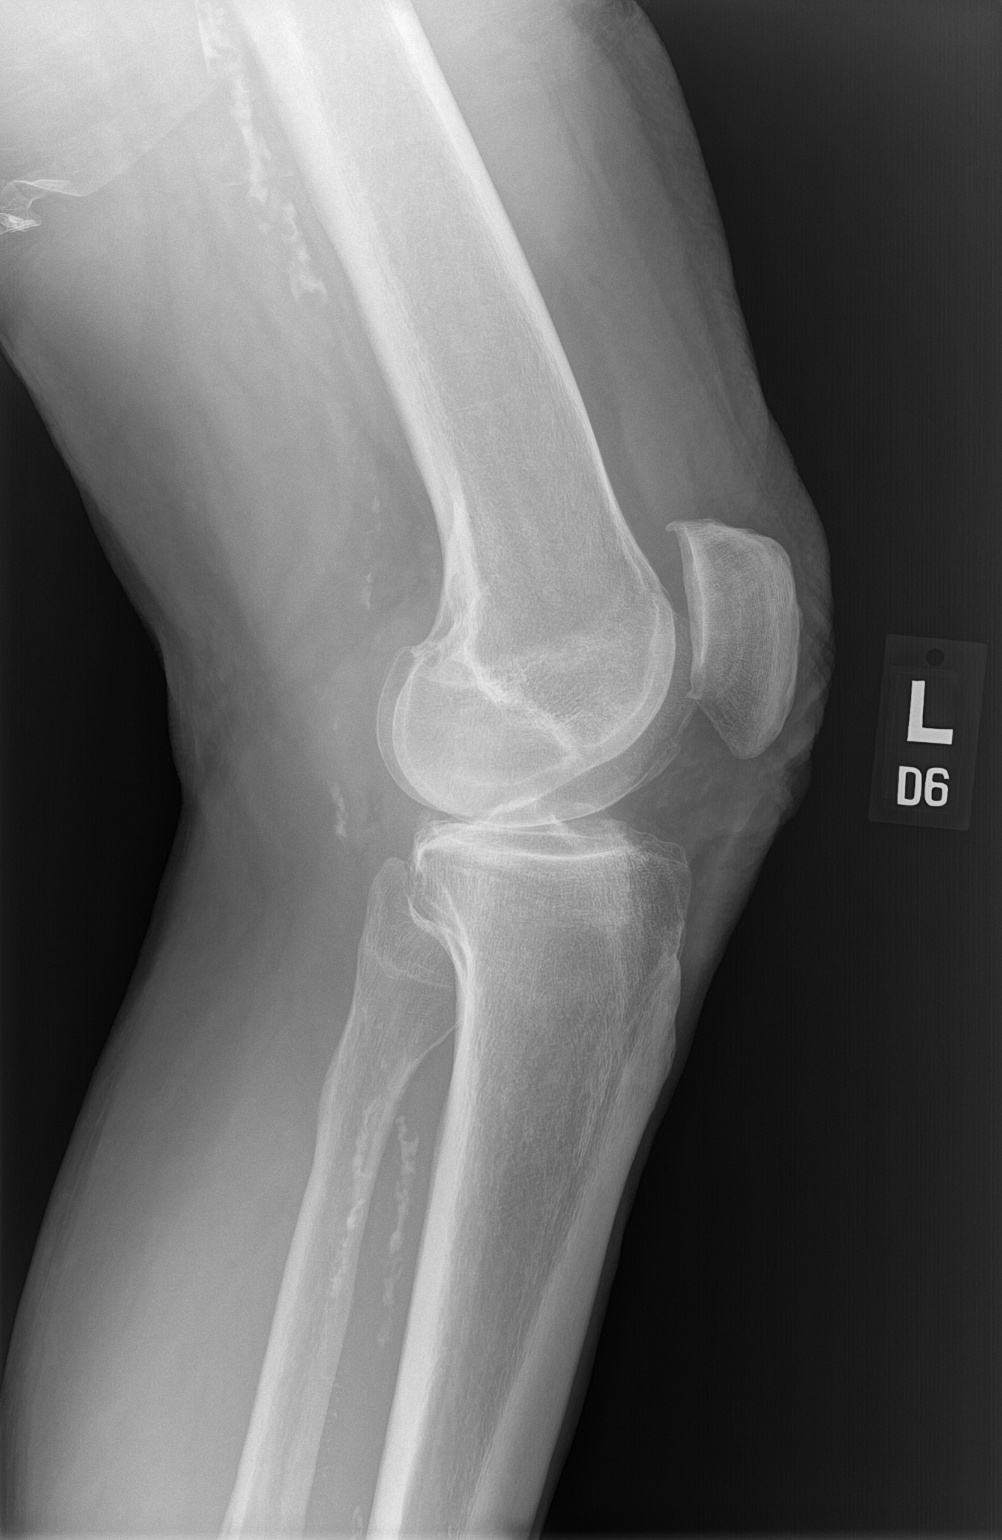

[patella]
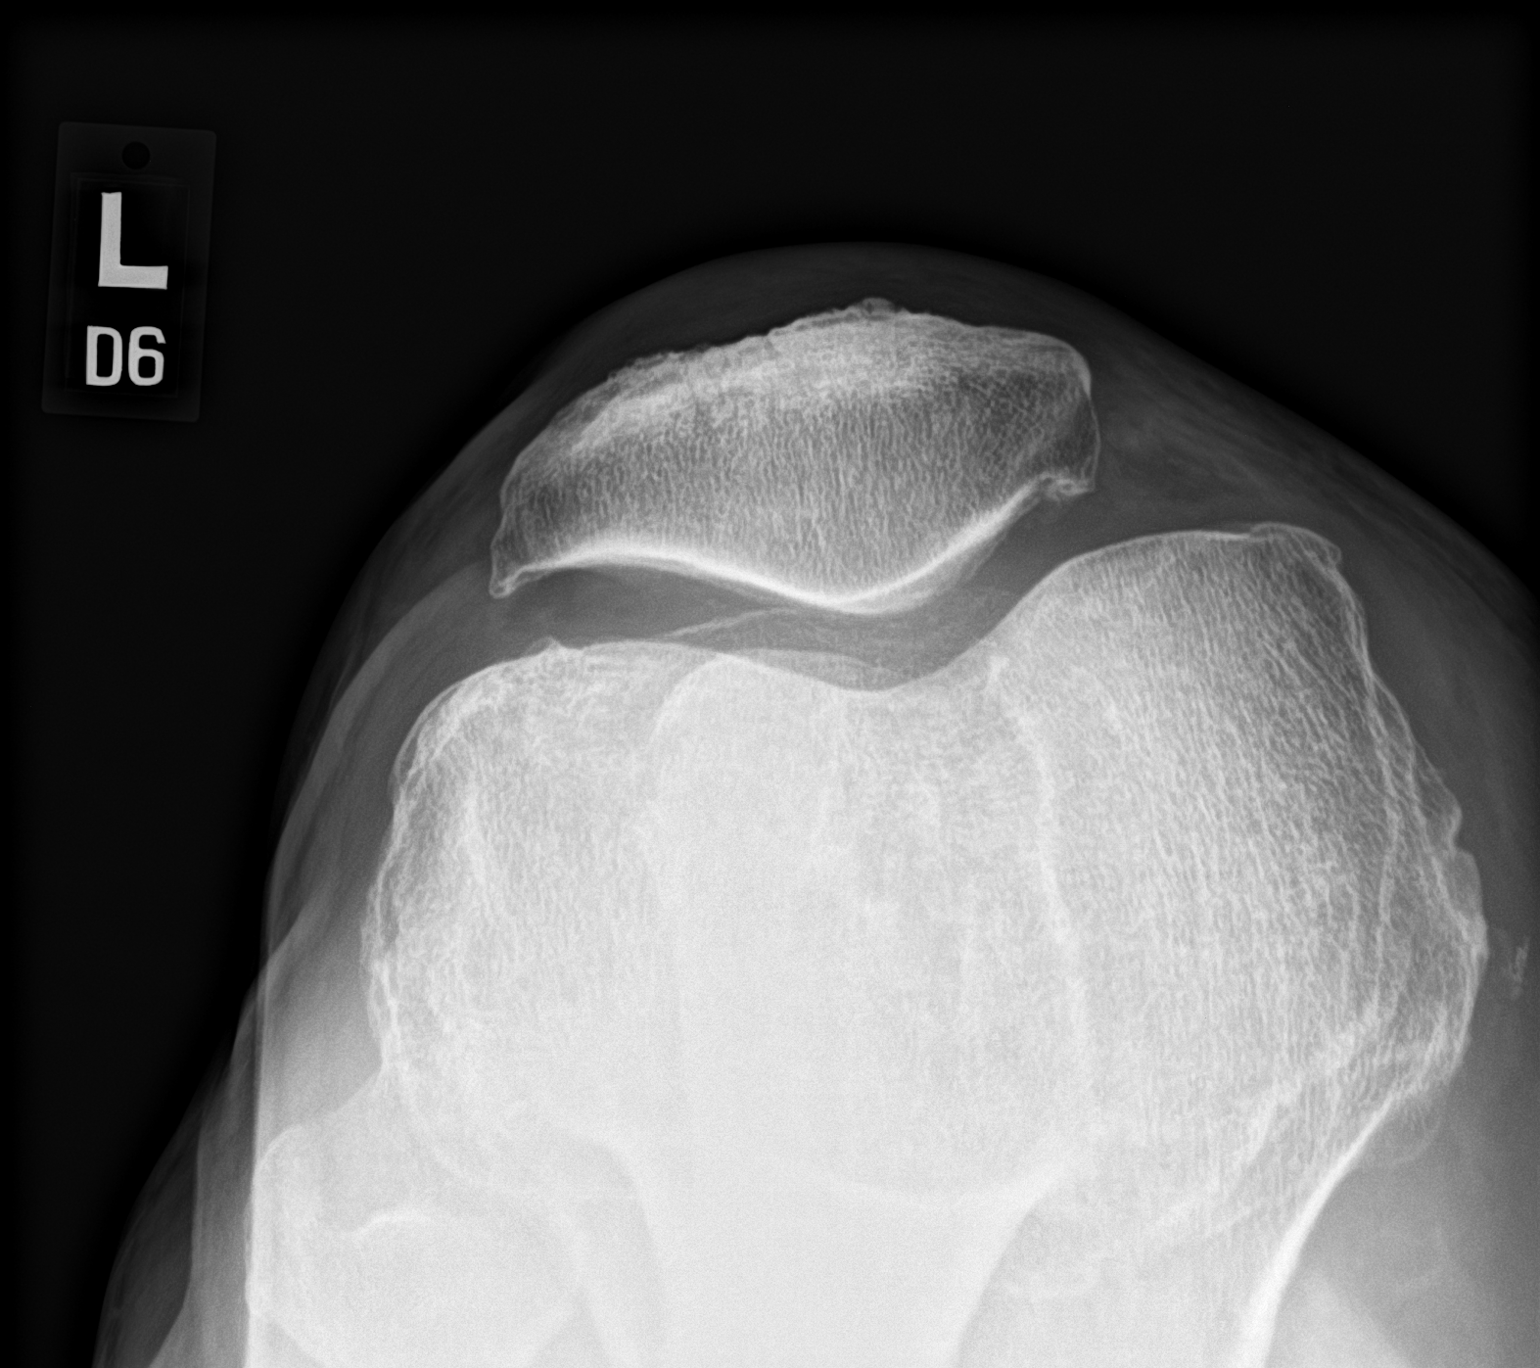

[3 of 3 positions shown; findings below may reference images not displayed]

FINDINGS: No fracture or dislocation of the left knee. Mild tricompartmental
joint space narrowing and osteophytosis. Small, nonspecific knee
joint effusion. Vascular calcinosis.
IMPRESSION: 1.  No fracture or dislocation of the left knee.

2.  Mild tricompartmental joint space narrowing and osteophytosis.

3.  Small, nonspecific knee joint effusion.

## 2022-07-18 NOTE — Progress Notes (Signed)
[place] Phone: [phone] Subjective:   [name], am serving as a scribe for Dr. Hulan Saas.  I'm seeing this patient by the request  of:  Shirline Frees, MD  CC: Back pain follow-up  [id number]  07/01/2022 Chronic problem with exacerbation.  Responding extremely well to osteopathic manipulation.  Discussed posture and ergonomics otherwise.  Increase activity slowly.  Follow-up again in 6 to 8 weeks  Significant improvement after the epidural.  Continue conservative therapy  On.  Patient does have a very small quadricep strain versus tear noted.  Discussed icing regimen and home exercises, discussed which activities to do and which ones to avoid, compression sleeve.  Patient does have a race next week and I do think will be able to do relatively well.  Continue the Cymbalta.  Neck pain is improved as well.  Injection given today, tolerated the procedure well, discussed icing regimen and home exercises, discussed which activities to do and which ones to avoid.  Increase activity slowly over the course the next several weeks.  Follow-up again in 6 to 8 weeks.  Updated 07/25/2022 KAULANA BRINDLE is a 71 y.o. male coming in with complaint of knee, leg, hip, and back pain. Patient states that R SI joint remains painful. Had hard time getting into the bed last night due to pain and had trouble sitting this morning. Used Theragun at his race but relief is temporary.   Pain in R VMO that he believes is chronic. If he is at race intensity, he is unable to use that leg. Rode hard on Monday and did feel some pain at 1.5 hours.   Would like a plan for next few months. Patient is not going to race until he feels better.        Past Medical History:  Diagnosis Date   Cataract    Depression    RESOLVED   Hyperlipidemia    Spinal stenosis    WAS HAVING NECK PAIN -PT STATES NERVE ABLATION PROCEDURE 2012 AND  PAIN HAS RESOLVED   Spondylitis ([place])    Past Surgical History:  Procedure Laterality Date   APPENDECTOMY  1962   HERNIA REPAIR     INSERTION OF MESH  10/06/2012   Procedure: INSERTION OF MESH;  Surgeon: Imogene Burn. Georgette Dover, MD;  Location: [facility];  Service: General;  Laterality: N/A;   [place]   left   TONSILLECTOMY  9628   UMBILICAL HERNIA REPAIR  10/06/2012   Procedure: HERNIA REPAIR UMBILICAL ADULT;  Surgeon: Imogene Burn. Georgette Dover, MD;  Location: [facility];  Service: General;  Laterality: N/A;   Social History   Socioeconomic History   Marital status: Married    Spouse name: Not on file   Number of children: Not on file   Years of education: Not on file   Highest education level: Not on file  Occupational History   Not on file  Tobacco Use   Smoking status: Never   Smokeless tobacco: Never  Vaping Use   Vaping Use: Never used  Substance and Sexual Activity   Alcohol use: Yes    Comment: OCCAS ALCOHOL   Drug use: No   Sexual activity: Not on file  Other Topics Concern   Not on file  Social History Narrative   ** Merged History Encounter **       Social Determinants of Health   Financial Resource Strain: Not  on file  Food Insecurity: Not on file  Transportation Needs: Not on file  Physical Activity: Not on file  Stress: Not on file  Social Connections: Not on file   Allergies  Allergen Reactions   Crestor [Rosuvastatin]     MUSCLE CRAMPS   Lipitor [Atorvastatin]     Liver toxicity   Family History  Problem Relation Age of Onset   Stroke Mother    Heart disease Father    Cancer Sister        bone    Current Outpatient Medications (Endocrine & Metabolic):    predniSONE (DELTASONE) 20 MG tablet, Take 2 tablets (40 mg total) by mouth daily with breakfast.  Current Outpatient Medications (Cardiovascular):    rosuvastatin (CRESTOR) 20 MG tablet, Take 1 tablet (20 mg total) by mouth daily.   nitroGLYCERIN (NITRO-DUR) 0.1 mg/hr patch, 1/4 patch  daily to affected area  Current Outpatient Medications (Respiratory):    albuterol (VENTOLIN HFA) 108 (90 Base) MCG/ACT inhaler, Inhale 2 puffs into the lungs every 4 (four) hours as needed for wheezing or shortness of breath.  Current Outpatient Medications (Analgesics):    meloxicam (MOBIC) 15 MG tablet, Take 1 tablet (15 mg total) by mouth daily.   aspirin 81 MG EC tablet, Take by mouth.  Current Outpatient Medications (Hematological):    ferrous sulfate 325 (65 FE) MG EC tablet, Take 65 mg by mouth daily.   folic acid (FOLVITE) 1 MG tablet, Take 800 mcg by mouth daily.   vitamin B-12 (CYANOCOBALAMIN) 1000 MCG tablet, Take 1,000 mcg by mouth daily.   cyanocobalamin (,VITAMIN B-12,) 1000 MCG/ML injection, Take by mouth.  Current Outpatient Medications (Other):    calcium-vitamin D (OSCAL WITH D) 500-200 MG-UNIT tablet, Take 1 tablet by mouth daily with breakfast.   cholecalciferol (VITAMIN D3) 10 MCG (400 UNIT) TABS tablet, Take 5,000 Units by mouth daily.   co-enzyme Q-10 30 MG capsule, Take 200 mg by mouth daily.   DULoxetine (CYMBALTA) 20 MG capsule, TAKE 2 CAPSULES BY MOUTH EVERY DAY   fish oil-omega-3 fatty acids 1000 MG capsule, Take 2 g by mouth daily.    L-ARGININE-500 PO, Take 400 mg by mouth. Daily   methocarbamol (ROBAXIN-750) 750 MG tablet, Take 1 tablet (750 mg total) by mouth every 8 (eight) hours as needed for muscle spasms.   Multiple Vitamins-Minerals (CULTURELLE PROBIOTICS + MULTIV PO), Take by mouth daily.   Probiotic, Lactobacillus, CAPS, Take by mouth.   Quercetin 250 MG TABS, Take 500 mg by mouth daily.   Quercetin Dihydrate POWD, Take by mouth.   vitamin A 10000 UNIT capsule, Take 3,000 Units by mouth daily.   vitamin C (ASCORBIC ACID) 500 MG tablet, Take 500 mg by mouth daily.   Vitamin D, Ergocalciferol, (DRISDOL) 1.25 MG (50000 UNIT) CAPS capsule, Take 1 capsule (50,000 Units total) by mouth every 7 (seven) days.   gabapentin (NEURONTIN) 100 MG capsule, Take  2 capsules (200 mg total) by mouth at bedtime.   gabapentin (NEURONTIN) 100 MG capsule, Take 2 capsules (200 mg total) by mouth 3 (three) times daily.   Reviewed prior external information including notes and imaging from  primary care provider As well as notes that were available from care everywhere and other healthcare systems.  Past medical history, social, surgical and family history all reviewed in electronic medical record.  No pertanent information unless stated regarding to the chief complaint.   Review of Systems:  No headache, visual changes, nausea, vomiting, diarrhea, constipation, dizziness, abdominal  pain, skin rash, fevers, chills, night sweats, weight loss, swollen lymph nodes, body aches, joint swelling, chest pain, shortness of breath, mood changes. POSITIVE muscle aches  Objective  Blood pressure 132/88, pulse 71, height '5\' 10"'$  (1.778 m), weight 148 lb (67.1 kg), SpO2 96 %.   General: No apparent distress alert and oriented x3 mood and affect normal, dressed appropriately.  HEENT: Pupils equal, extraocular movements intact  Respiratory: Patient's speak in full sentences and does not appear short of breath  Cardiovascular: No lower extremity edema, non tender, no erythema  Patient's right knee does have tenderness to palpation noted.  Mild swelling compared to the contralateral side.  Patient does have pain with resisted extension of the knee.  Back exam tender to palpation with all movements.  Especially with any movement rotation.  Tender to palpation over the sacroiliac joint.  Does have some straight leg test difficulty bilaterally only 5 degrees of extension of the back    Impression and Recommendations:     The above documentation has been reviewed and is accurate and complete [name], DO

## 2022-07-25 ENCOUNTER — Ambulatory Visit (INDEPENDENT_AMBULATORY_CARE_PROVIDER_SITE_OTHER): Payer: Medicare Other | Admitting: Family Medicine

## 2022-07-25 VITALS — BP 132/88 | HR 71 | Ht 70.0 in | Wt 148.0 lb

## 2022-07-25 DIAGNOSIS — M1711 Unilateral primary osteoarthritis, right knee: Secondary | ICD-10-CM

## 2022-07-25 DIAGNOSIS — S76109A Unspecified injury of unspecified quadriceps muscle, fascia and tendon, initial encounter: Secondary | ICD-10-CM | POA: Diagnosis not present

## 2022-07-25 DIAGNOSIS — M5416 Radiculopathy, lumbar region: Secondary | ICD-10-CM | POA: Diagnosis not present

## 2022-07-25 MED ORDER — METHOCARBAMOL 750 MG PO TABS: 750.0000 mg | ORAL_TABLET | Freq: Three times a day (TID) | ORAL | 0 refills | Status: DC | PRN

## 2022-07-25 NOTE — Patient Instructions (Addendum)
Start with taking Robaxin at night Exercises for VMO Nothing strenuous Epidural [phone] See me in 4 weeks

## 2022-07-26 ENCOUNTER — Encounter: Payer: Self-pay | Admitting: Family Medicine

## 2022-07-26 NOTE — Assessment & Plan Note (Signed)
Continues to have intermittent radicular symptoms.  Unfortunately did cause him to discontinue her brace secondary to the amount of pain.  Now has been constant.  Seems to be more bilateral.  Discussed with patient about icing regimen and home exercises otherwise.  Follow-up with me after an epidural.  Do feel that this could be beneficial diagnostically as well as therapeutically.  Follow-up with me again in 4 to 6 weeks after the epidural and will consider osteopathic manipulation.

## 2022-07-26 NOTE — Assessment & Plan Note (Signed)
Has known patellofemoral arthritis but continues to have some difficulty.  Patient did not respond as well to the steroid injection this time.  Seems to have more of a quadricep partial tear noted.  Patient will take a step back from some of the more aggressive therapies at this point.  Increase activity otherwise slowly.  Follow-up with me again in 6 to 8 weeks

## 2022-07-30 NOTE — Discharge Instructions (Signed)

## 2022-07-31 ENCOUNTER — Ambulatory Visit
Admission: RE | Admit: 2022-07-31 | Discharge: 2022-07-31 | Disposition: A | Discharge status: Home / Self Care | Payer: Medicare Other | Source: Ambulatory Visit | Attending: Family Medicine | Admitting: Family Medicine

## 2022-07-31 DIAGNOSIS — M5416 Radiculopathy, lumbar region: Secondary | ICD-10-CM

## 2022-07-31 DIAGNOSIS — M545 Low back pain, unspecified: Secondary | ICD-10-CM | POA: Diagnosis not present

## 2022-07-31 MED ORDER — IOPAMIDOL (ISOVUE-M 200) INJECTION 41%
1.0000 mL | Freq: Once | INTRAMUSCULAR | Status: AC
  Administered 2022-07-31: 1 mL via EPIDURAL

## 2022-07-31 MED ORDER — METHYLPREDNISOLONE ACETATE 40 MG/ML INJ SUSP (RADIOLOG
80.0000 mg | Freq: Once | INTRAMUSCULAR | Status: AC
  Administered 2022-07-31: 80 mg via EPIDURAL

## 2022-08-01 ENCOUNTER — Other Ambulatory Visit: Payer: Self-pay | Admitting: Family Medicine

## 2022-08-05 ENCOUNTER — Encounter: Payer: Self-pay | Admitting: Family Medicine

## 2022-08-12 NOTE — Progress Notes (Unsigned)
[place] Phone: [phone] Subjective:   [name], am serving as a scribe for Dr. Hulan Saas.  I'm seeing this patient by the request  of:  Shirline Frees, MD  CC: low back pain follow up   [id number]  07/25/2022 Has known patellofemoral arthritis but continues to have some difficulty.  Patient did not respond as well to the steroid injection this time.  Seems to have more of a quadricep partial tear noted.  Patient will take a step back from some of the more aggressive therapies at this point.  Increase activity otherwise slowly.  Follow-up with me again in 6 to 8 weeks  Continues to have intermittent radicular symptoms.  Unfortunately did cause him to discontinue her brace secondary to the amount of pain.  Now has been constant.  Seems to be more bilateral.  Discussed with patient about icing regimen and home exercises otherwise.  Follow-up with me after an epidural.  Do feel that this could be beneficial diagnostically as well as therapeutically.  Follow-up with me again in 4 to 6 weeks after the epidural and will consider osteopathic manipulation.  Updated 08/13/2022 SHARONE ALMOND is a 71 y.o. male coming in with complaint of back and knee pain. Pain in R SI joint is constant. Worse with standing, walking and lying on R side. Will have relief with psoas release. Ran today to see if he would be in pain and pain was present with jogging. Epidural was not very helpful.  Also R quad strain is treating with nitro patches. Has not tried any intense exercise.        Past Medical History:  Diagnosis Date   Cataract    Depression    RESOLVED   Hyperlipidemia    Spinal stenosis    WAS HAVING NECK PAIN -PT STATES NERVE ABLATION PROCEDURE 2012 AND PAIN HAS RESOLVED   Spondylitis ([place])    Past Surgical History:  Procedure Laterality Date   APPENDECTOMY  1962   HERNIA REPAIR     INSERTION OF MESH   10/06/2012   Procedure: INSERTION OF MESH;  Surgeon: Imogene Burn. Georgette Dover, MD;  Location: [facility];  Service: General;  Laterality: N/A;   [place]   left   TONSILLECTOMY  [id number]   UMBILICAL HERNIA REPAIR  10/06/2012   Procedure: HERNIA REPAIR UMBILICAL ADULT;  Surgeon: Imogene Burn. Georgette Dover, MD;  Location: [facility];  Service: General;  Laterality: N/A;   Social History   Socioeconomic History   Marital status: Married    Spouse name: Not on file   Number of children: Not on file   Years of education: Not on file   Highest education level: Not on file  Occupational History   Not on file  Tobacco Use   Smoking status: Never   Smokeless tobacco: Never  Vaping Use   Vaping Use: Never used  Substance and Sexual Activity   Alcohol use: Yes    Comment: OCCAS ALCOHOL   Drug use: No   Sexual activity: Not on file  Other Topics Concern   Not on file  Social History Narrative   ** Merged History Encounter **       Social Determinants of Health   Financial Resource Strain: Not on file  Food Insecurity: Not on file  Transportation Needs: Not on file  Physical Activity: Not on file  Stress: Not on file  Social Connections:  Not on file   Allergies  Allergen Reactions   Crestor [Rosuvastatin]     MUSCLE CRAMPS   Lipitor [Atorvastatin]     Liver toxicity   Family History  Problem Relation Age of Onset   Stroke Mother    Heart disease Father    Cancer Sister        bone    Current Outpatient Medications (Endocrine & Metabolic):    predniSONE (DELTASONE) 20 MG tablet, Take 2 tablets (40 mg total) by mouth daily with breakfast.  Current Outpatient Medications (Cardiovascular):    rosuvastatin (CRESTOR) 20 MG tablet, Take 1 tablet (20 mg total) by mouth daily.   nitroGLYCERIN (NITRO-DUR) 0.1 mg/hr patch, 1/4 patch daily to affected area  Current Outpatient Medications (Respiratory):    albuterol (VENTOLIN HFA) 108 (90 Base) MCG/ACT inhaler, Inhale 2 puffs into the  lungs every 4 (four) hours as needed for wheezing or shortness of breath.  Current Outpatient Medications (Analgesics):    meloxicam (MOBIC) 15 MG tablet, TAKE 1 TABLET (15 MG TOTAL) BY MOUTH DAILY.   aspirin 81 MG EC tablet, Take by mouth.  Current Outpatient Medications (Hematological):    ferrous sulfate 325 (65 FE) MG EC tablet, Take 65 mg by mouth daily.   folic acid (FOLVITE) 1 MG tablet, Take 800 mcg by mouth daily.   vitamin B-12 (CYANOCOBALAMIN) 1000 MCG tablet, Take 1,000 mcg by mouth daily.   cyanocobalamin (,VITAMIN B-12,) 1000 MCG/ML injection, Take by mouth.  Current Outpatient Medications (Other):    calcium-vitamin D (OSCAL WITH D) 500-200 MG-UNIT tablet, Take 1 tablet by mouth daily with breakfast.   cholecalciferol (VITAMIN D3) 10 MCG (400 UNIT) TABS tablet, Take 5,000 Units by mouth daily.   co-enzyme Q-10 30 MG capsule, Take 200 mg by mouth daily.   DULoxetine (CYMBALTA) 20 MG capsule, TAKE 2 CAPSULES BY MOUTH EVERY DAY   fish oil-omega-3 fatty acids 1000 MG capsule, Take 2 g by mouth daily.    L-ARGININE-500 PO, Take 400 mg by mouth. Daily   methocarbamol (ROBAXIN-750) 750 MG tablet, Take 1 tablet (750 mg total) by mouth every 8 (eight) hours as needed for muscle spasms.   Multiple Vitamins-Minerals (CULTURELLE PROBIOTICS + MULTIV PO), Take by mouth daily.   Probiotic, Lactobacillus, CAPS, Take by mouth.   Quercetin 250 MG TABS, Take 500 mg by mouth daily.   Quercetin Dihydrate POWD, Take by mouth.   vitamin A 10000 UNIT capsule, Take 3,000 Units by mouth daily.   vitamin C (ASCORBIC ACID) 500 MG tablet, Take 500 mg by mouth daily.   Vitamin D, Ergocalciferol, (DRISDOL) 1.25 MG (50000 UNIT) CAPS capsule, Take 1 capsule (50,000 Units total) by mouth every 7 (seven) days.   gabapentin (NEURONTIN) 100 MG capsule, Take 2 capsules (200 mg total) by mouth at bedtime.   gabapentin (NEURONTIN) 100 MG capsule, Take 2 capsules (200 mg total) by mouth 3 (three) times  daily.   Reviewed prior external information including notes and imaging from  primary care provider As well as notes that were available from care everywhere and other healthcare systems.  Past medical history, social, surgical and family history all reviewed in electronic medical record.  No pertanent information unless stated regarding to the chief complaint.   Review of Systems:  No headache, visual changes, nausea, vomiting, diarrhea, constipation, dizziness, abdominal pain, skin rash, fevers, chills, night sweats, weight loss, swollen lymph nodes, body aches, joint swelling, chest pain, shortness of breath, mood changes. POSITIVE muscle aches  Objective  Blood pressure 126/72, pulse 71, height '5\' 10"'$  (1.778 m), weight 148 lb (67.1 kg), SpO2 97 %.   General: No apparent distress alert and oriented x3 mood and affect normal, dressed appropriately.  HEENT: Pupils equal, extraocular movements intact  Respiratory: Patient's speak in full sentences and does not appear short of breath  Cardiovascular: No lower extremity edema, non tender, no erythema   Back exam shows some loss of lordosis.  Still has some tightness noted with left straight leg test.  Patient does have a positive FABER test on the right side.  More tenderness to palpation over the right sacroiliac joint today.  Procedure: Real-time Ultrasound Guided Injection of right sacroiliac joint Device: GE Logiq Q7 Ultrasound guided injection is preferred based studies that show increased duration, increased effect, greater accuracy, decreased procedural pain, increased response rate, and decreased cost with ultrasound guided versus blind injection.  Verbal informed consent obtained.  Time-out conducted.  Noted no overlying erythema, induration, or other signs of local infection.  Skin prepped in a sterile fashion.  Local anesthesia: Topical Ethyl chloride.  With sterile technique and under real time ultrasound guidance: With a  21-gauge 2 inch needle patient was injected with 0.5 cc of 0.5% mg/mL Marcaine and 1 cc of Kenalog 40 mg per mill Completed without difficulty  Pain immediately resolved suggesting accurate placement of the medication.  Advised to call if fevers/chills, erythema, induration, drainage, or persistent bleeding.  Impression: Technically successful ultrasound guided injection.    Impression and Recommendations:    The above documentation has been reviewed and is accurate and complete [name], DO

## 2022-08-13 ENCOUNTER — Ambulatory Visit: Payer: Self-pay

## 2022-08-13 ENCOUNTER — Ambulatory Visit (INDEPENDENT_AMBULATORY_CARE_PROVIDER_SITE_OTHER): Payer: Medicare Other | Admitting: Family Medicine

## 2022-08-13 ENCOUNTER — Encounter: Payer: Self-pay | Admitting: Family Medicine

## 2022-08-13 VITALS — BP 126/72 | HR 71 | Ht 70.0 in | Wt 148.0 lb

## 2022-08-13 DIAGNOSIS — M533 Sacrococcygeal disorders, not elsewhere classified: Secondary | ICD-10-CM | POA: Diagnosis not present

## 2022-08-13 DIAGNOSIS — G8929 Other chronic pain: Secondary | ICD-10-CM | POA: Diagnosis not present

## 2022-08-13 NOTE — Assessment & Plan Note (Signed)
Patient given injection and tolerated the procedure well.  Patient has some improvement in range of motion almost immediately bilaterally in the sacroiliac joints.  Only did the injection on the right side.  Discussed icing regimen and home exercises, which activities to do and which ones to avoid increase activity slowly otherwise.  Follow-up with me again in 6 to 8 weeks.

## 2022-08-13 NOTE — Patient Instructions (Signed)
Injected SI joint today See me in 4-6 weeks

## 2022-08-19 DIAGNOSIS — Z23 Encounter for immunization: Secondary | ICD-10-CM | POA: Diagnosis not present

## 2022-08-20 ENCOUNTER — Ambulatory Visit: Payer: Medicare Other | Admitting: Family Medicine

## 2022-08-29 ENCOUNTER — Encounter: Payer: Self-pay | Admitting: Family Medicine

## 2022-09-06 NOTE — Progress Notes (Unsigned)
[place] Phone: [phone] Subjective:   Paul [name], am serving as a scribe for Dr. Hulan Saas.  I'm seeing this patient by the request  of:  Shirline Frees, MD  CC: Right knee pain and back pain follow-up  [id number]  08/13/2022 Patient given injection and tolerated the procedure well.  Patient has some improvement in range of motion almost immediately bilaterally in the sacroiliac joints.  Only did the injection on the right side.  Discussed icing regimen and home exercises, which activities to do and which ones to avoid increase activity slowly otherwise.  Follow-up with me again in 6 to 8 weeks.  Updated 09/10/2022 Paul [name] is a 71 y.o. male coming in with complaint of SI joint pain. Patient states that injection helped the SI.   Has not tested quad yet with running. Patient has been taking it easy and working on swimming but overall not doing as much physical activity as he had been since he is not racing until Sept 2024.   Would like to continue once weekly Vit D if advised.       Past Medical History:  Diagnosis Date   Cataract    Depression    RESOLVED   Hyperlipidemia    Spinal stenosis    WAS HAVING NECK PAIN -PT STATES NERVE ABLATION PROCEDURE 2012 AND PAIN HAS RESOLVED   Spondylitis ([place])    Past Surgical History:  Procedure Laterality Date   APPENDECTOMY  1962   HERNIA REPAIR     INSERTION OF MESH  10/06/2012   Procedure: INSERTION OF MESH;  Surgeon: Imogene Burn. Georgette Dover, MD;  Location: [facility];  Service: General;  Laterality: N/A;   [place]   left   TONSILLECTOMY  [id number]   UMBILICAL HERNIA REPAIR  10/06/2012   Procedure: HERNIA REPAIR UMBILICAL ADULT;  Surgeon: Imogene Burn. Georgette Dover, MD;  Location: [facility];  Service: General;  Laterality: N/A;   Social History   Socioeconomic History   Marital status: Married    Spouse name: Not on file   Number of  children: Not on file   Years of education: Not on file   Highest education level: Not on file  Occupational History   Not on file  Tobacco Use   Smoking status: Never   Smokeless tobacco: Never  Vaping Use   Vaping Use: Never used  Substance and Sexual Activity   Alcohol use: Yes    Comment: OCCAS ALCOHOL   Drug use: [name]   Sexual activity: Not on file  Other Topics Concern   Not on file  Social History Narrative   ** Merged History Encounter **       Social Determinants of Health   Financial Resource Strain: Not on file  Food Insecurity: Not on file  Transportation Needs: Not on file  Physical Activity: Not on file  Stress: Not on file  Social Connections: Not on file   Allergies  Allergen Reactions   Crestor [Rosuvastatin]     MUSCLE CRAMPS   Lipitor [Atorvastatin]     Liver toxicity   Family History  Problem Relation Age of Onset   Stroke Mother    Heart disease Father    Cancer Sister        bone    Current Outpatient Medications (Endocrine & Metabolic):    predniSONE (DELTASONE) 20 MG tablet, Take 2 tablets (40 mg total) by mouth  daily with breakfast.  Current Outpatient Medications (Cardiovascular):    rosuvastatin (CRESTOR) 20 MG tablet, Take 1 tablet (20 mg total) by mouth daily.   nitroGLYCERIN (NITRO-DUR) 0.1 mg/hr patch, 1/4 patch daily to affected area  Current Outpatient Medications (Respiratory):    albuterol (VENTOLIN HFA) 108 (90 Base) MCG/ACT inhaler, Inhale 2 puffs into the lungs every 4 (four) hours as needed for wheezing or shortness of breath.  Current Outpatient Medications (Analgesics):    meloxicam (MOBIC) 15 MG tablet, TAKE 1 TABLET (15 MG TOTAL) BY MOUTH DAILY.   aspirin 81 MG EC tablet, Take by mouth.  Current Outpatient Medications (Hematological):    ferrous sulfate 325 (65 FE) MG EC tablet, Take 65 mg by mouth daily.   folic acid (FOLVITE) 1 MG tablet, Take 800 mcg by mouth daily.   vitamin B-12 (CYANOCOBALAMIN) 1000 MCG  tablet, Take 1,000 mcg by mouth daily.   cyanocobalamin (,VITAMIN B-12,) 1000 MCG/ML injection, Take by mouth.  Current Outpatient Medications (Other):    calcium-vitamin D (OSCAL WITH D) 500-200 MG-UNIT tablet, Take 1 tablet by mouth daily with breakfast.   cholecalciferol (VITAMIN D3) 10 MCG (400 UNIT) TABS tablet, Take 5,000 Units by mouth daily.   co-enzyme Q-10 30 MG capsule, Take 200 mg by mouth daily.   DULoxetine (CYMBALTA) 20 MG capsule, TAKE 2 CAPSULES BY MOUTH EVERY DAY   fish oil-omega-3 fatty acids 1000 MG capsule, Take 2 g by mouth daily.    L-ARGININE-500 PO, Take 400 mg by mouth. Daily   methocarbamol (ROBAXIN-750) 750 MG tablet, Take 1 tablet (750 mg total) by mouth every 8 (eight) hours as needed for muscle spasms.   Multiple Vitamins-Minerals (CULTURELLE PROBIOTICS + MULTIV PO), Take by mouth daily.   Probiotic, Lactobacillus, CAPS, Take by mouth.   Quercetin 250 MG TABS, Take 500 mg by mouth daily.   Quercetin Dihydrate POWD, Take by mouth.   vitamin A 10000 UNIT capsule, Take 3,000 Units by mouth daily.   vitamin C (ASCORBIC ACID) 500 MG tablet, Take 500 mg by mouth daily.   Vitamin D, Ergocalciferol, (DRISDOL) 1.25 MG (50000 UNIT) CAPS capsule, Take 1 capsule (50,000 Units total) by mouth every 7 (seven) days.   gabapentin (NEURONTIN) 100 MG capsule, Take 2 capsules (200 mg total) by mouth at bedtime.   gabapentin (NEURONTIN) 100 MG capsule, Take 2 capsules (200 mg total) by mouth 3 (three) times daily.   Reviewed prior external information including notes and imaging from  primary care provider As well as notes that were available from care everywhere and other healthcare systems.  Past medical history, social, surgical and family history all reviewed in electronic medical record.  [name] pertanent information unless stated regarding to the chief complaint.   Review of Systems:  [name] headache, visual changes, nausea, vomiting, diarrhea, constipation, dizziness, abdominal  pain, skin rash, fevers, chills, night sweats, weight loss, swollen lymph nodes, body aches, joint swelling, chest pain, shortness of breath, mood changes. POSITIVE muscle aches  Objective  Blood pressure 124/84, pulse 83, height '5\' 10"'$  (1.778 m), weight 148 lb (67.1 kg), SpO2 98 %.   General: [name] apparent distress alert and oriented x3 mood and affect normal, dressed appropriately.  HEENT: Pupils equal, extraocular movements intact  Respiratory: Patient's speak in full sentences and does not appear short of breath  Cardiovascular: [name] lower extremity edema, non tender, [name] erythema  Right knee exam Differential.  Patient is minorly tender to the palpation over the quadricep.  Good strength though noted.  Low back exam does have some loss of lordosis.  Some tenderness to palpation in the paraspinal musculature.  Limited muscular skeletal ultrasound was performed and interpreted by Hulan Saas, M  Limited ultrasound of patient's right knee shows that the area in the quadricep where the tear was appreciated previously does have good scar tissue formation noted.  Patient does have increasing neovascularization in the area.  The knee itself has [name] significant swelling. Impression: Interval improvement  Osteopathic findings T3 extended rotated and side bent right inhaled third rib T9 extended rotated and side bent left L2 flexed rotated and side bent right Sacrum right on right     Impression and Recommendations:      The above documentation has been reviewed and is accurate and complete [name], DO

## 2022-09-10 ENCOUNTER — Ambulatory Visit: Payer: Self-pay

## 2022-09-10 ENCOUNTER — Ambulatory Visit (INDEPENDENT_AMBULATORY_CARE_PROVIDER_SITE_OTHER): Payer: Medicare Other | Admitting: Family Medicine

## 2022-09-10 VITALS — BP 124/84 | HR 83 | Ht 70.0 in | Wt 148.0 lb

## 2022-09-10 DIAGNOSIS — M9908 Segmental and somatic dysfunction of rib cage: Secondary | ICD-10-CM

## 2022-09-10 DIAGNOSIS — M533 Sacrococcygeal disorders, not elsewhere classified: Secondary | ICD-10-CM | POA: Diagnosis not present

## 2022-09-10 DIAGNOSIS — M9902 Segmental and somatic dysfunction of thoracic region: Secondary | ICD-10-CM

## 2022-09-10 DIAGNOSIS — M9903 Segmental and somatic dysfunction of lumbar region: Secondary | ICD-10-CM | POA: Diagnosis not present

## 2022-09-10 DIAGNOSIS — G8929 Other chronic pain: Secondary | ICD-10-CM | POA: Diagnosis not present

## 2022-09-10 DIAGNOSIS — M9904 Segmental and somatic dysfunction of sacral region: Secondary | ICD-10-CM | POA: Diagnosis not present

## 2022-09-10 DIAGNOSIS — M9901 Segmental and somatic dysfunction of cervical region: Secondary | ICD-10-CM

## 2022-09-10 DIAGNOSIS — M1711 Unilateral primary osteoarthritis, right knee: Secondary | ICD-10-CM

## 2022-09-10 NOTE — Assessment & Plan Note (Signed)
Chronic problem but improved after the steroid injection.  We discussed with patient icing regimen and home exercises.  Discussed over the differential of a lumbar radiculopathy.  Discussed different medications including gabapentin 200 mg at night and the meloxicam 15 mg daily as needed.  Follow-up weeks.  Responded well to manipulation

## 2022-09-10 NOTE — Assessment & Plan Note (Signed)
Patient is doing much better at this time.  No significant swelling of the knee.  Patient still has some mild scar tissue from the quadricep injury.  Patient will continue to work in range of motion and start increasing activity at this time.  Patient does not have any true other braces coming up until September of next year at this moment.  Follow-up with me again in 2 to 3 months

## 2022-09-10 NOTE — Assessment & Plan Note (Signed)
   Decision today to treat with OMT was based on Physical Exam  After verbal consent patient was treated with HVLA, ME, FPR techniques in , thoracic, , lumbar and sacral areas, all areas are chronic   Patient tolerated the procedure well with improvement in symptoms  Patient given exercises, stretches and lifestyle modifications  See medications in patient instructions if given  Patient will follow up in 4-8 weeks

## 2022-09-10 NOTE — Patient Instructions (Signed)
Great to see you as always Looks great on the quad Ok to run but only 2 days the first week and slow and steady wins the race for now See me again in 8 weeks [place]

## 2022-09-16 ENCOUNTER — Other Ambulatory Visit (INDEPENDENT_AMBULATORY_CARE_PROVIDER_SITE_OTHER): Payer: Self-pay

## 2022-09-23 DIAGNOSIS — R0981 Nasal congestion: Secondary | ICD-10-CM | POA: Diagnosis not present

## 2022-09-23 DIAGNOSIS — J0100 Acute maxillary sinusitis, unspecified: Secondary | ICD-10-CM | POA: Diagnosis not present

## 2022-09-25 ENCOUNTER — Other Ambulatory Visit: Payer: Self-pay | Admitting: Family Medicine

## 2022-10-07 ENCOUNTER — Ambulatory Visit: Payer: Medicare Other

## 2022-10-07 ENCOUNTER — Ambulatory Visit (INDEPENDENT_AMBULATORY_CARE_PROVIDER_SITE_OTHER): Payer: Medicare Other | Admitting: Family Medicine

## 2022-10-07 VITALS — BP 118/80 | HR 57 | Ht 70.0 in | Wt 173.0 lb

## 2022-10-07 DIAGNOSIS — M9904 Segmental and somatic dysfunction of sacral region: Secondary | ICD-10-CM

## 2022-10-07 DIAGNOSIS — M9908 Segmental and somatic dysfunction of rib cage: Secondary | ICD-10-CM

## 2022-10-07 DIAGNOSIS — M9903 Segmental and somatic dysfunction of lumbar region: Secondary | ICD-10-CM | POA: Diagnosis not present

## 2022-10-07 DIAGNOSIS — M25562 Pain in left knee: Secondary | ICD-10-CM

## 2022-10-07 DIAGNOSIS — M9902 Segmental and somatic dysfunction of thoracic region: Secondary | ICD-10-CM

## 2022-10-07 DIAGNOSIS — M1712 Unilateral primary osteoarthritis, left knee: Secondary | ICD-10-CM

## 2022-10-07 DIAGNOSIS — M5416 Radiculopathy, lumbar region: Secondary | ICD-10-CM | POA: Diagnosis not present

## 2022-10-07 DIAGNOSIS — M9901 Segmental and somatic dysfunction of cervical region: Secondary | ICD-10-CM

## 2022-10-07 NOTE — Patient Instructions (Signed)
Medial un-loader and lateral un-loader  6-8 week follow up

## 2022-10-07 NOTE — Progress Notes (Signed)
[place] Phone: [phone] Subjective:    I'm seeing this patient by the request  of:  Shirline Frees, MD  CC: Left knee pain  [id number]  Paul [name] is a 71 y.o. male coming in with complaint of back and neck pain. OMT 09/10/2022. Also f/u for R knee pain and SI joint pain. Patient states that he is doing well with all of is other issues he is now having left knee pain when he does his weights  Medications patient has been prescribed:   Taking:         Reviewed prior external information including notes and imaging from previsou exam, outside providers and external EMR if available.   As well as notes that were available from care everywhere and other healthcare systems.  Past medical history, social, surgical and family history all reviewed in electronic medical record.  No pertanent information unless stated regarding to the chief complaint.   Past Medical History:  Diagnosis Date   Cataract    Depression    RESOLVED   Hyperlipidemia    Spinal stenosis    WAS HAVING NECK PAIN -PT STATES NERVE ABLATION PROCEDURE 2012 AND PAIN HAS RESOLVED   Spondylitis ([place])     Allergies  Allergen Reactions   Crestor [Rosuvastatin]     MUSCLE CRAMPS   Lipitor [Atorvastatin]     Liver toxicity     Review of Systems:  No headache, visual changes, nausea, vomiting, diarrhea, constipation, dizziness, abdominal pain, skin rash, fevers, chills, night sweats, weight loss, swollen lymph nodes, body aches, joint swelling, chest pain, shortness of breath, mood changes. POSITIVE muscle aches  Objective  Blood pressure 118/80, pulse (!) 57, height '5\' 10"'$  (1.778 m), weight 173 lb (78.5 kg), SpO2 99 %.   General: No apparent distress alert and oriented x3 mood and affect normal, dressed appropriately.  HEENT: Pupils equal, extraocular movements intact  Respiratory: Patient's speak in full sentences and  does not appear short of breath  Cardiovascular: No lower extremity edema, non tender, no erythema  Left knee exam does have some tenderness to palpation.  Seems to be on the lateral aspect of the knee.  Does have some instability with valgus and varus force.  Patient does have more of a valgus deformity of the knees bilaterally left greater than right.  Instability noted of the left side with abnormal thigh to calf ratio.  Osteopathic findings  C2 flexed rotated and side bent right C7 flexed rotated and side bent left T3 extended rotated and side bent right inhaled rib T9 extended rotated and side bent left L2 flexed rotated and side bent right Sacrum right on right  After informed written and verbal consent, patient was seated on exam table. Left knee was prepped with alcohol swab and utilizing anterolateral approach, patient's left knee space was injected with 4:1  marcaine 0.5%: Kenalog '40mg'$ /dL. Patient tolerated the procedure well without immediate complications.     Assessment and Plan:  Degenerative arthritis of left knee Worsening pain noted.  More instability noted.  Abnormal thigh to calf ratio.  Patient is ambulatory.  I do believe a custom lateral unloader brace would be beneficial.  Patient will be set up for this.  We discussed also the smaller one to be used with some of his other activity still.  Patient continues to try to be significantly active including triathlons.  Follow-up again in 6 to 8 weeks  Nonallopathic problems  Decision today to treat with OMT was based on Physical Exam  After verbal consent patient was treated with HVLA, ME, FPR techniques in cervical, rib, thoracic, lumbar, and sacral  areas  Patient tolerated the procedure well with improvement in symptoms  Patient given exercises, stretches and lifestyle modifications  See medications in patient instructions if given  Patient will follow up in 4-8 weeks    The above documentation has been  reviewed and is accurate and complete [name], DO          Note: This dictation was prepared with Dragon dictation along with smaller phrase technology. Any transcriptional errors that result from this process are unintentional.

## 2022-10-07 NOTE — Assessment & Plan Note (Signed)
Responded well to osteopathic manipulation.  Discussed with patient icing regimen and home exercises.  We discussed continued core stability and or the strengthening exercises with this being his off season.  Follow-up again in 6 to 8 weeks.

## 2022-10-07 NOTE — Assessment & Plan Note (Signed)
Worsening pain noted.  More instability noted.  Abnormal thigh to calf ratio.  Patient is ambulatory.  I do believe a custom lateral unloader brace would be beneficial.  Patient will be set up for this.  We discussed also the smaller one to be used with some of his other activity still.  Patient continues to try to be significantly active including triathlons.  Follow-up again in 6 to 8 weeks

## 2022-10-28 DIAGNOSIS — B338 Other specified viral diseases: Secondary | ICD-10-CM | POA: Diagnosis not present

## 2022-10-28 DIAGNOSIS — R059 Cough, unspecified: Secondary | ICD-10-CM | POA: Diagnosis not present

## 2022-10-28 DIAGNOSIS — Z20828 Contact with and (suspected) exposure to other viral communicable diseases: Secondary | ICD-10-CM | POA: Diagnosis not present

## 2022-11-04 ENCOUNTER — Ambulatory Visit: Payer: Medicare Other | Admitting: Family Medicine

## 2022-11-26 NOTE — Progress Notes (Signed)
[name] [place] Phone: [phone] Subjective:   Rito Ehrlich, am serving as a scribe for Dr. Hulan Saas.  I'm seeing this patient by the request  of:  Shirline Frees, MD  CC: back and neck pain follow up   PNT:[id number]  JAISHON KRISHER is a 72 y.o. male coming in with complaint of back and neck pain. OMT 10/07/2022. Also L knee pain f/u. Patient states back and SI joint is doing well, but has noticed some pulsing pain in his right shoulder clavicle area at night when laying down. Patient has been doing upper body workouts and noticed the right side is more weak than the left side. The arthritis in the left knee, painful in the back of knee. Patient is back on the Cymbalta.   Medications patient has been prescribed: Robaxin  Taking:         Reviewed prior external information including notes and imaging from previsou exam, outside providers and external EMR if available.   As well as notes that were available from care everywhere and other healthcare systems.  Past medical history, social, surgical and family history all reviewed in electronic medical record.  No pertanent information unless stated regarding to the chief complaint.   Past Medical History:  Diagnosis Date   Cataract    Depression    RESOLVED   Hyperlipidemia    Spinal stenosis    WAS HAVING NECK PAIN -PT STATES NERVE ABLATION PROCEDURE 2012 AND PAIN HAS RESOLVED   Spondylitis ([place])     Allergies  Allergen Reactions   Crestor [Rosuvastatin]     MUSCLE CRAMPS   Lipitor [Atorvastatin]     Liver toxicity     Review of Systems:  No headache, visual changes, nausea, vomiting, diarrhea, constipation, dizziness, abdominal pain, skin rash, fevers, chills, night sweats, weight loss, swollen lymph nodes, body aches, joint swelling, chest pain, shortness of breath, mood changes. POSITIVE muscle aches  Objective  Blood pressure  110/68, pulse 71, height '5\' 10"'$  (1.778 m), weight 177 lb (80.3 kg), SpO2 93 %.   General: No apparent distress alert and oriented x3 mood and affect normal, dressed appropriately.  HEENT: Pupils equal, extraocular movements intact  Respiratory: Patient's speak in full sentences and does not appear short of breath  Cardiovascular: No lower extremity edema, non tender, no erythema  MSK:  Neck exam still has limited sidebending bilaterally.  Tightness noted in [place] as well of the lower back.  Lacks last 15 degrees of extension.  Tenderness to palpation over the [facility] joint on the right side.  Worsening pain with resisted adduction  Skin does show multiple SKs on patient's back and arms. Osteopathic findings  C2 flexed rotated and side bent right C7 flexed rotated and side bent left T3 extended rotated and side bent right inhaled rib T8 extended rotated and side bent left L3 flexed rotated and side bent left Sacrum right on right     Assessment and Plan:  Pain of right sternoclavicular joint I believe the patient did have some postural aspects.  Discussed with patient about icing regimen and home exercises, which activities to do and which ones to avoid, increase activity slowly, discussed icing regimen.  Follow-up again in 6 to 8 weeks.    Nonallopathic problems  Decision today to treat with OMT was based on Physical Exam  After verbal consent patient was treated with HVLA, ME, FPR techniques in cervical, rib, thoracic, lumbar,  and sacral  areas  Patient tolerated the procedure well with improvement in symptoms  Patient given exercises, stretches and lifestyle modifications  See medications in patient instructions if given  Patient will follow up in 4-8 weeks    The above documentation has been reviewed and is accurate and complete [name], DO          Note: This dictation was prepared with Dragon dictation along with smaller phrase technology. Any transcriptional  errors that result from this process are unintentional.

## 2022-12-02 ENCOUNTER — Ambulatory Visit (INDEPENDENT_AMBULATORY_CARE_PROVIDER_SITE_OTHER): Payer: Medicare Other | Admitting: Family Medicine

## 2022-12-02 VITALS — BP 110/68 | HR 71 | Ht 70.0 in | Wt 177.0 lb

## 2022-12-02 DIAGNOSIS — M9901 Segmental and somatic dysfunction of cervical region: Secondary | ICD-10-CM | POA: Diagnosis not present

## 2022-12-02 DIAGNOSIS — M5030 Other cervical disc degeneration, unspecified cervical region: Secondary | ICD-10-CM | POA: Diagnosis not present

## 2022-12-02 DIAGNOSIS — M9904 Segmental and somatic dysfunction of sacral region: Secondary | ICD-10-CM | POA: Diagnosis not present

## 2022-12-02 DIAGNOSIS — M533 Sacrococcygeal disorders, not elsewhere classified: Secondary | ICD-10-CM

## 2022-12-02 DIAGNOSIS — M9903 Segmental and somatic dysfunction of lumbar region: Secondary | ICD-10-CM | POA: Diagnosis not present

## 2022-12-02 DIAGNOSIS — M9902 Segmental and somatic dysfunction of thoracic region: Secondary | ICD-10-CM

## 2022-12-02 DIAGNOSIS — D229 Melanocytic nevi, unspecified: Secondary | ICD-10-CM | POA: Diagnosis not present

## 2022-12-02 DIAGNOSIS — M25511 Pain in right shoulder: Secondary | ICD-10-CM

## 2022-12-02 DIAGNOSIS — M9908 Segmental and somatic dysfunction of rib cage: Secondary | ICD-10-CM

## 2022-12-02 NOTE — Assessment & Plan Note (Signed)
Referred to dermatology

## 2022-12-02 NOTE — Patient Instructions (Signed)
Great to see you  We will refer you to dermatology to discuss Stay active but no more heman lifting and keep hands within peripheral vison.  See me again in 7-8 weeks

## 2022-12-02 NOTE — Assessment & Plan Note (Signed)
I believe the patient did have some postural aspects.  Discussed with patient about icing regimen and home exercises, which activities to do and which ones to avoid, increase activity slowly, discussed icing regimen.  Follow-up again in 6 to 8 weeks.

## 2022-12-02 NOTE — Assessment & Plan Note (Signed)
Chronic problem with mild exacerbation.  Discussed icing regimen and home exercises, discussed which activities to do and which ones to avoid.  Increase activity slowly over the course of next several weeks.  Patient is going to attempt to lose some of the winter weight he has been normal.  Follow-up again in 6 to 8 weeks

## 2022-12-02 NOTE — Assessment & Plan Note (Signed)
Discussed icing regimen, discussed which activities to do and which ones to avoid.  Did respond extremely well to osteopathic manipulation.  We discussed different medications including gabapentin and the Cymbalta.  Do think that they will be helpful. 6 to 8 weeks.

## 2022-12-10 NOTE — Progress Notes (Signed)
[facility] Note  12/24/2022     CHIEF COMPLAINT Patient presents for Retina Follow Up    HISTORY OF PRESENT ILLNESS: Paul [name] is a 72 y.o. male who presents to the clinic today for:   HPI     Retina Follow Up   Patient presents with  Other.  In left eye.  Severity is moderate.  Duration of 12 months.  Since onset it is stable.  I, the attending physician,  performed the HPI with the patient and updated documentation appropriately.        Comments   Pt here for 12 mo ret f/u for ERM OS. Pt states VA the same, no changes. Pt did see Dr. [place] the other day but has not filled new rx. Wanted to wait until he saw Dr. Coralyn Pear.       Last edited by Bernarda Caffey, MD on 12/24/2022 12:46 PM.    Pt states he is not having any problems with his vision. Follows with Dr. [place] for primary eye care.  Referring physician: Vevelyn Royals, MD  HISTORICAL INFORMATION:   Selected notes from the MEDICAL RECORD NUMBER Referred by Dr. Lucita Ferrara for retina eval and cataract clearance LEE:  Ocular Hx- PMH-    CURRENT MEDICATIONS: No current outpatient medications on file. (Ophthalmic Drugs)   No current facility-administered medications for this visit. (Ophthalmic Drugs)   Current Outpatient Medications (Other)  Medication Sig   albuterol (VENTOLIN HFA) 108 (90 Base) MCG/ACT inhaler Inhale 2 puffs into the lungs every 4 (four) hours as needed for wheezing or shortness of breath.   cholecalciferol (VITAMIN D3) 10 MCG (400 UNIT) TABS tablet Take 5,000 Units by mouth daily.   co-enzyme Q-10 30 MG capsule Take 200 mg by mouth daily.   DULoxetine (CYMBALTA) 20 MG capsule TAKE 2 CAPSULES BY MOUTH EVERY DAY   ferrous sulfate 325 (65 FE) MG EC tablet Take 65 mg by mouth daily.   fish oil-omega-3 fatty acids 1000 MG capsule Take 2 g by mouth daily.    folic acid (FOLVITE) 1 MG tablet Take 800 mcg by mouth daily.   L-ARGININE-500 PO Take 400 mg by mouth.  Daily   Multiple Vitamins-Minerals (CULTURELLE PROBIOTICS + MULTIV PO) Take by mouth daily.   Probiotic, Lactobacillus, CAPS Take by mouth.   Quercetin 250 MG TABS Take 500 mg by mouth daily.   Quercetin Dihydrate POWD Take by mouth.   rosuvastatin (CRESTOR) 20 MG tablet Take 1 tablet (20 mg total) by mouth daily.   vitamin A 10000 UNIT capsule Take 3,000 Units by mouth daily.   vitamin B-12 (CYANOCOBALAMIN) 1000 MCG tablet Take 1,000 mcg by mouth daily.   vitamin C (ASCORBIC ACID) 500 MG tablet Take 500 mg by mouth daily.   Vitamin D, Ergocalciferol, (DRISDOL) 1.25 MG (50000 UNIT) CAPS capsule TAKE 1 CAPSULE (50,000 UNITS TOTAL) BY MOUTH EVERY 7 (SEVEN) DAYS   No current facility-administered medications for this visit. (Other)   REVIEW OF SYSTEMS: ROS   Positive for: Eyes Negative for: Constitutional, Gastrointestinal, Neurological, Skin, Genitourinary, Musculoskeletal, HENT, Endocrine, Cardiovascular, Respiratory, Psychiatric, Allergic/Imm, Heme/Lymph Last edited by Kingsley Spittle, COT on 12/24/2022  8:55 AM.     ALLERGIES Allergies  Allergen Reactions   Crestor [Rosuvastatin]     MUSCLE CRAMPS   Lipitor [Atorvastatin]     Liver toxicity   PAST MEDICAL HISTORY Past Medical History:  Diagnosis Date   Cataract    Depression  RESOLVED   Hyperlipidemia    Spinal stenosis    WAS HAVING NECK PAIN -PT STATES NERVE ABLATION PROCEDURE 2012 AND PAIN HAS RESOLVED   Spondylitis ([place])    Past Surgical History:  Procedure Laterality Date   APPENDECTOMY  1962   HERNIA REPAIR     INSERTION OF MESH  10/06/2012   Procedure: INSERTION OF MESH;  Surgeon: Imogene Burn. Georgette Dover, MD;  Location: [facility];  Service: General;  Laterality: N/A;   [place]   left   TONSILLECTOMY  0626   UMBILICAL HERNIA REPAIR  10/06/2012   Procedure: HERNIA REPAIR UMBILICAL ADULT;  Surgeon: Imogene Burn. Tsuei, MD;  Location: [facility];  Service: General;  Laterality: N/A;   FAMILY HISTORY Family  History  Problem Relation Age of Onset   Stroke Mother    Heart disease Father    Cancer Sister        bone   SOCIAL HISTORY Social History   Tobacco Use   Smoking status: Never   Smokeless tobacco: Never  Vaping Use   Vaping Use: Never used  Substance Use Topics   Alcohol use: Yes    Comment: OCCAS ALCOHOL   Drug use: No       OPHTHALMIC EXAM:  Base Eye Exam     Visual Acuity (Snellen - Linear)       Right Left   Dist [place] 20/20 20/25   Dist ph [place]  20/20         Tonometry (Tonopen, 8:58 AM)       Right Left   Pressure 12 15         Pupils       Pupils Dark Light Shape React APD   Right PERRL 5 4 Round Brisk None   Left PERRL 5 4 Round Brisk None         Visual Fields (Counting fingers)       Left Right    Full Full         Extraocular Movement       Right Left    Full, Ortho Full, Ortho         Neuro/Psych     Oriented x3: Yes   Mood/Affect: Normal         Dilation     Both eyes: 1.0% Mydriacyl, 2.5% Phenylephrine @ 8:59 AM           Slit Lamp and Fundus Exam     Slit Lamp Exam       Right Left   Lids/Lashes Dermatochalasis - upper lid Dermatochalasis - upper lid   Conjunctiva/Sclera White and quiet White and quiet   Cornea mild arcus, well healed cataract wound, tear film debris mild arcus, well healed cataract wound   Anterior Chamber deep, clear, narrow temporal angle deep, clear, narrow temporal angle   Iris Round and dilated Round and dilated   Lens Vivity PC IOL in good position Vivity PC IOL in good position   Anterior Vitreous Vitreous syneresis Vitreous syneresis         Fundus Exam       Right Left   Disc Pink and Sharp, Compact, focal PPP Pink and Sharp, mild tilt   C/D Ratio 0.4 0.3   Macula Flat, Good foveal reflex, mild RPE mottling, No heme or edema Flat, blunted foveal reflex, mild ERM with early striae, No heme or edema   Vessels mild attenuation, mild tortuosity mild attenuation, mild  tortuosity  Periphery Attached, No heme, No RT/RD Attached, mild pigmented cystoid degeneration, No heme, No RT/RD, flat CR nevus SN to disc (1.25DD), mild peripheral drusen           IMAGING AND PROCEDURES  Imaging and Procedures for 12/24/2022  OCT, Retina - OU - Both Eyes       Right Eye Quality was good. Central Foveal Thickness: 278. Progression has been stable. Findings include normal foveal contour, no IRF, no SRF (Trace ERM).   Left Eye Quality was good. Central Foveal Thickness: 350. Progression has been stable. Findings include no IRF, no SRF, abnormal foveal contour, epiretinal membrane (Mild ERM with blunted foveal contour -- stable from prior).   Notes *Images captured and stored on drive  Diagnosis / Impression:  OD: NFP, no IRF/SRF OS: Mild ERM with blunted foveal contour - stable from prior; no IRF/SRF  Clinical management:  See below  Abbreviations: NFP - Normal foveal profile. CME - cystoid macular edema. PED - pigment epithelial detachment. IRF - intraretinal fluid. SRF - subretinal fluid. EZ - ellipsoid zone. ERM - epiretinal membrane. ORA - outer retinal atrophy. ORT - outer retinal tubulation. SRHM - subretinal hyper-reflective material. IRHM - intraretinal hyper-reflective material            ASSESSMENT/PLAN:    ICD-10-CM   1. Epiretinal membrane (ERM) of left eye  H35.372 OCT, Retina - OU - Both Eyes    2. 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 Combined forms of age-related cataract of both eyes  H25.813      1. Epiretinal membrane, left eye - mild ERM -- stable - BCVA 20/20 OU - asymptomatic, no metamorphopsia - OCT with no significant change in ERM  - no indication for surgery at this time - monitor - f/u 1 yr -- DFE/OCT  2. Choroidal Nevus, OS  - 1.25 DD pigmented lesion SN to disc  - no visual symptoms, SRF or orange pigment  - no drusen  - flat, thickness < 75m  - discussed findings, prognosis  - monitor  3.  Pseudophakia OU  - s/p CE/IOL OU (Vivity lenses OU, Dr. SLucita Ferrara Nov. '22)  - IOLs in perfect position, doing well  - monitor  Ophthalmic Meds Ordered this visit:  No orders of the defined types were placed in this encounter.    Return in about 1 year (around 12/25/2023) for f/u ERM OS, DFE, OCT.  There are no Patient Instructions on file for this visit.   Explained the diagnoses, plan, and follow up with the patient and they expressed understanding.  Patient expressed understanding of the importance of proper follow up care.   This document serves as a record of services personally performed by BGardiner Sleeper MD, PhD. It was created on their behalf by CRenaldo Reel CGrove Cityan ophthalmic technician. The creation of this record is the provider's dictation and/or activities during the visit.    Electronically signed by:  CRenaldo Reel COT  01.23.24 12:46 PM  This document serves as a record of services personally performed by BGardiner Sleeper MD, PhD. It was created on their behalf by ASan Jetty BOwens Shark OA an ophthalmic technician. The creation of this record is the provider's dictation and/or activities during the visit.    Electronically signed by: ASan Jetty BOwens Shark O[place]02.06.2024 12:46 PM  BGardiner Sleeper M.D., Ph.D. Diseases & Surgery of the Retina and Vitreous [facility] I have reviewed  the above documentation for accuracy and completeness, and I agree with the above. Gardiner Sleeper, M.D., Ph.D. 12/24/22 12:47 PM   Abbreviations: M myopia (nearsighted); A astigmatism; H hyperopia (farsighted); P presbyopia; Mrx spectacle prescription;  CTL contact lenses; OD right eye; OS left eye; OU both eyes  XT exotropia; ET esotropia; PEK punctate epithelial keratitis; PEE punctate epithelial erosions; DES dry eye syndrome; MGD meibomian gland dysfunction; ATs artificial tears; PFAT's preservative free artificial tears; [place] nuclear sclerotic cataract; PSC  posterior subcapsular cataract; ERM epi-retinal membrane; PVD posterior vitreous detachment; RD retinal detachment; DM diabetes mellitus; DR diabetic retinopathy; NPDR non-proliferative diabetic retinopathy; PDR proliferative diabetic retinopathy; CSME clinically significant macular edema; DME diabetic macular edema; dbh dot blot hemorrhages; CWS cotton wool spot; POAG primary open angle glaucoma; C/D cup-to-disc ratio; HVF humphrey visual field; GVF goldmann visual field; OCT optical coherence tomography; IOP intraocular pressure; BRVO Branch retinal vein occlusion; CRVO central retinal vein occlusion; CRAO central retinal artery occlusion; BRAO branch retinal artery occlusion; RT retinal tear; SB scleral buckle; PPV pars plana vitrectomy; VH Vitreous hemorrhage; PRP panretinal laser photocoagulation; IVK intravitreal kenalog; VMT vitreomacular traction; MH Macular hole;  NVD neovascularization of the disc; NVE neovascularization elsewhere; AREDS age related eye disease study; ARMD age related macular degeneration; POAG primary open angle glaucoma; EBMD epithelial/anterior basement membrane dystrophy; ACIOL anterior chamber intraocular lens; IOL intraocular lens; PCIOL posterior chamber intraocular lens; Phaco/IOL phacoemulsification with intraocular lens placement; [place]p [place] photorefractive keratectomy; LASIK laser assisted in situ keratomileusis; HTN hypertension; DM diabetes mellitus; COPD chronic obstructive pulmonary disease

## 2022-12-20 DIAGNOSIS — D3132 Benign neoplasm of left choroid: Secondary | ICD-10-CM | POA: Diagnosis not present

## 2022-12-24 ENCOUNTER — Ambulatory Visit (INDEPENDENT_AMBULATORY_CARE_PROVIDER_SITE_OTHER): Payer: Medicare Other | Admitting: Ophthalmology

## 2022-12-24 ENCOUNTER — Encounter (INDEPENDENT_AMBULATORY_CARE_PROVIDER_SITE_OTHER): Payer: Self-pay | Admitting: Ophthalmology

## 2022-12-24 DIAGNOSIS — D3132 Benign neoplasm of left choroid: Secondary | ICD-10-CM | POA: Diagnosis not present

## 2022-12-24 DIAGNOSIS — H35372 Puckering of macula, left eye: Secondary | ICD-10-CM | POA: Diagnosis not present

## 2022-12-24 DIAGNOSIS — H25813 Combined forms of age-related cataract, bilateral: Secondary | ICD-10-CM

## 2022-12-30 ENCOUNTER — Other Ambulatory Visit: Payer: Self-pay | Admitting: Family Medicine

## 2022-12-30 MED ORDER — VITAMIN D (ERGOCALCIFEROL) 1.25 MG (50000 UNIT) PO CAPS: 50000.0000 [IU] | ORAL_CAPSULE | ORAL | 0 refills | Status: DC

## 2023-01-07 NOTE — Progress Notes (Unsigned)
[name] [place] Phone: [phone] Subjective:   [name], am serving as a scribe for Dr. Hulan Saas.  I'm seeing this patient by the request  of:  Shirline Frees, MD  CC: Right shoulder pain  [id number]  Paul [name] is a 72 y.o. male coming in with complaint of R shoulder pain. Patient states that he has been doing a lot of upper body strength work. One week ago developed pain in anterior aspect of shoulder. Tried dry needling on Monday which gave him some relief. PT believes he may have supraspinatus tendon tear. Pain is in middle deltoid. Unable to swim yesterday due to pain.  Has had some pain in the same area that usually goes away quickly.   Patient has not done as much running because he feels like his L knee will not stand up to intense training.       Past Medical History:  Diagnosis Date   Cataract    Depression    RESOLVED   Hyperlipidemia    Spinal stenosis    WAS HAVING NECK PAIN -PT STATES NERVE ABLATION PROCEDURE 2012 AND PAIN HAS RESOLVED   Spondylitis ([place])    Past Surgical History:  Procedure Laterality Date   APPENDECTOMY  1962   HERNIA REPAIR     INSERTION OF MESH  10/06/2012   Procedure: INSERTION OF MESH;  Surgeon: Imogene Burn. Georgette Dover, MD;  Location: [facility];  Service: General;  Laterality: N/A;   [place]   left   TONSILLECTOMY  [id number]   UMBILICAL HERNIA REPAIR  10/06/2012   Procedure: HERNIA REPAIR UMBILICAL ADULT;  Surgeon: Imogene Burn. Georgette Dover, MD;  Location: [facility];  Service: General;  Laterality: N/A;   Social History   Socioeconomic History   Marital status: Married    Spouse name: Not on file   Number of children: Not on file   Years of education: Not on file   Highest education level: Not on file  Occupational History   Not on file  Tobacco Use   Smoking status: Never   Smokeless tobacco: Never  Vaping Use   Vaping Use: Never used   Substance and Sexual Activity   Alcohol use: Yes    Comment: OCCAS ALCOHOL   Drug use: No   Sexual activity: Not on file  Other Topics Concern   Not on file  Social History Narrative   ** Merged History Encounter **       Social Determinants of Health   Financial Resource Strain: Not on file  Food Insecurity: Not on file  Transportation Needs: Not on file  Physical Activity: Not on file  Stress: Not on file  Social Connections: Not on file   Allergies  Allergen Reactions   Crestor [Rosuvastatin]     MUSCLE CRAMPS   Lipitor [Atorvastatin]     Liver toxicity   Family History  Problem Relation Age of Onset   Stroke Mother    Heart disease Father    Cancer Sister        bone     Current Outpatient Medications (Cardiovascular):    rosuvastatin (CRESTOR) 20 MG tablet, Take 1 tablet (20 mg total) by mouth daily.  Current Outpatient Medications (Respiratory):    albuterol (VENTOLIN HFA) 108 (90 Base) MCG/ACT inhaler, Inhale 2 puffs into the lungs every 4 (four) hours as needed for wheezing or shortness of breath.   Current Outpatient  Medications (Hematological):    ferrous sulfate 325 (65 FE) MG EC tablet, Take 65 mg by mouth daily.   folic acid (FOLVITE) 1 MG tablet, Take 800 mcg by mouth daily.   vitamin B-12 (CYANOCOBALAMIN) 1000 MCG tablet, Take 1,000 mcg by mouth daily.  Current Outpatient Medications (Other):    cholecalciferol (VITAMIN D3) 10 MCG (400 UNIT) TABS tablet, Take 5,000 Units by mouth daily.   co-enzyme Q-10 30 MG capsule, Take 200 mg by mouth daily.   DULoxetine (CYMBALTA) 20 MG capsule, TAKE 2 CAPSULES BY MOUTH EVERY DAY   fish oil-omega-3 fatty acids 1000 MG capsule, Take 2 g by mouth daily.    L-ARGININE-500 PO, Take 400 mg by mouth. Daily   Multiple Vitamins-Minerals (CULTURELLE PROBIOTICS + MULTIV PO), Take by mouth daily.   Probiotic, Lactobacillus, CAPS, Take by mouth.   Quercetin 250 MG TABS, Take 500 mg by mouth daily.   Quercetin  Dihydrate POWD, Take by mouth.   vitamin A 10000 UNIT capsule, Take 3,000 Units by mouth daily.   vitamin C (ASCORBIC ACID) 500 MG tablet, Take 500 mg by mouth daily.   Vitamin D, Ergocalciferol, (DRISDOL) 1.25 MG (50000 UNIT) CAPS capsule, Take 1 capsule (50,000 Units total) by mouth every 7 (seven) days.   Reviewed prior external information including notes and imaging from  primary care provider As well as notes that were available from care everywhere and other healthcare systems.  Past medical history, social, surgical and family history all reviewed in electronic medical record.  No pertanent information unless stated regarding to the chief complaint.   Review of Systems:  No headache, visual changes, nausea, vomiting, diarrhea, constipation, dizziness, abdominal pain, skin rash, fevers, chills, night sweats, weight loss, swollen lymph nodes, body aches, joint swelling, chest pain, shortness of breath, mood changes. POSITIVE muscle aches  Objective  Blood pressure 116/82, pulse (!) 58, height 5' 10"$  (1.778 m), weight 176 lb (79.8 kg), SpO2 98 %.   General: No apparent distress alert and oriented x3 mood and affect normal, dressed appropriately.  HEENT: Pupils equal, extraocular movements intact  Respiratory: Patient's speak in full sentences and does not appear short of breath  Cardiovascular: No lower extremity edema, non tender, no erythema  Right shoulder exam shows that patient does have a positive crossover noted.  Rotator cuff does appear to be intact.  Patient does have some voluntary guarding noted though.  Negative O'Brien's which is good news.  Does have some swelling over the acromioclavicular joint.   Limited muscular skeletal ultrasound was performed and interpreted by Hulan Saas, M  Limited ultrasound shows the patient's rotator cuff appears to be intact.  Does have some mild hypoechoic changes consistent with more of a subacromial area.  Patient does have  significant swelling of the acromioclavicular joint noted.  Some arthritic changes with moderate narrowing of the joint space. Impression: Acromioclavicular effusion  Procedure: Real-time Ultrasound Guided Injection of right acromioclavicular joint Device: GE Logiq Q7 Ultrasound guided injection is preferred based studies that show increased duration, increased effect, greater accuracy, decreased procedural pain, increased response rate, and decreased cost with ultrasound guided versus blind injection.  Verbal informed consent obtained.  Time-out conducted.  Noted no overlying erythema, induration, or other signs of local infection.  Skin prepped in a sterile fashion.  Local anesthesia: Topical Ethyl chloride.  With sterile technique and under real time ultrasound guidance: With a 25-gauge half inch needle injected with 0.5 cc of 0.5% Marcaine and 0.5 cc of Kenalog  40 mg/mL. Completed without difficulty  Pain immediately resolved suggesting accurate placement of the medication.  Advised to call if fevers/chills, erythema, induration, drainage, or persistent bleeding.  Impression: Technically successful ultrasound guided injection.   Impression and Recommendations:     The above documentation has been reviewed and is accurate and complete [name], DO

## 2023-01-08 ENCOUNTER — Ambulatory Visit (INDEPENDENT_AMBULATORY_CARE_PROVIDER_SITE_OTHER): Payer: Medicare Other | Admitting: Family Medicine

## 2023-01-08 ENCOUNTER — Ambulatory Visit: Payer: Self-pay

## 2023-01-08 VITALS — BP 116/82 | HR 58 | Ht 70.0 in | Wt 176.0 lb

## 2023-01-08 DIAGNOSIS — M25511 Pain in right shoulder: Secondary | ICD-10-CM

## 2023-01-08 NOTE — Patient Instructions (Signed)
Ice 20 min  Work on [employer] joint at [employer] See me in 6-8 weeks

## 2023-01-08 NOTE — Assessment & Plan Note (Signed)
Chronic problem with worsening symptoms.  Right-sided did have some effusion noted today.  Last injection was 5 years ago.  Likely some of the weight lifting has caused some exacerbation.  Discussed icing regimen and home exercises, discussed which activities to do and which ones to avoid, increase activity slowly patient will monitor range of motion.  Follow-up again as scheduled.

## 2023-01-24 NOTE — Progress Notes (Unsigned)
[place] Phone: [phone] Subjective:   [name], am serving as a scribe for Dr. Hulan Saas.  I'm seeing this patient by the request  of:  Shirline Frees, MD  CC: Right shoulder pain, low back pain  [id number]  01/08/2023 Chronic problem with worsening symptoms.  Right-sided did have some effusion noted today.  Last injection was 5 years ago.  Likely some of the weight lifting has caused some exacerbation.  Discussed icing regimen and home exercises, discussed which activities to do and which ones to avoid, increase activity slowly patient will monitor range of motion.  Follow-up again as scheduled.      Update 01/27/2023 Paul [name] is a 72 y.o. male coming in with complaint of R shoulder pain. Patient states that he was able to grab his seatbelt without pain. Has not been training for past 3 days. Stretching the bicep has been helpful.   Every 10 minutes pain in posterior lateral L knee occurs. No pain at rest. Unsure if he feels instability but does not running        Past Medical History:  Diagnosis Date   Cataract    Depression    RESOLVED   Hyperlipidemia    Spinal stenosis    WAS HAVING NECK PAIN -PT STATES NERVE ABLATION PROCEDURE 2012 AND PAIN HAS RESOLVED   Spondylitis ([place])    Past Surgical History:  Procedure Laterality Date   APPENDECTOMY  1962   HERNIA REPAIR     INSERTION OF MESH  10/06/2012   Procedure: INSERTION OF MESH;  Surgeon: Imogene Burn. Georgette Dover, MD;  Location: [facility];  Service: General;  Laterality: N/A;   [place]   left   TONSILLECTOMY  [id number]   UMBILICAL HERNIA REPAIR  10/06/2012   Procedure: HERNIA REPAIR UMBILICAL ADULT;  Surgeon: Imogene Burn. Georgette Dover, MD;  Location: [facility];  Service: General;  Laterality: N/A;   Social History   Socioeconomic History   Marital status: Married    Spouse name: Not on file   Number of children: Not on file    Years of education: Not on file   Highest education level: Not on file  Occupational History   Not on file  Tobacco Use   Smoking status: Never   Smokeless tobacco: Never  Vaping Use   Vaping Use: Never used  Substance and Sexual Activity   Alcohol use: Yes    Comment: OCCAS ALCOHOL   Drug use: No   Sexual activity: Not on file  Other Topics Concern   Not on file  Social History Narrative   ** Merged History Encounter **       Social Determinants of Health   Financial Resource Strain: Not on file  Food Insecurity: Not on file  Transportation Needs: Not on file  Physical Activity: Not on file  Stress: Not on file  Social Connections: Not on file   Allergies  Allergen Reactions   Crestor [Rosuvastatin]     MUSCLE CRAMPS   Lipitor [Atorvastatin]     Liver toxicity   Family History  Problem Relation Age of Onset   Stroke Mother    Heart disease Father    Cancer Sister        bone     Current Outpatient Medications (Cardiovascular):    rosuvastatin (CRESTOR) 20 MG tablet, Take 1 tablet (20 mg total) by mouth daily.  Current Outpatient Medications (  Respiratory):    albuterol (VENTOLIN HFA) 108 (90 Base) MCG/ACT inhaler, Inhale 2 puffs into the lungs every 4 (four) hours as needed for wheezing or shortness of breath.   Current Outpatient Medications (Hematological):    ferrous sulfate 325 (65 FE) MG EC tablet, Take 65 mg by mouth daily.   folic acid (FOLVITE) 1 MG tablet, Take 800 mcg by mouth daily.   vitamin B-12 (CYANOCOBALAMIN) 1000 MCG tablet, Take 1,000 mcg by mouth daily.  Current Outpatient Medications (Other):    cholecalciferol (VITAMIN D3) 10 MCG (400 UNIT) TABS tablet, Take 5,000 Units by mouth daily.   co-enzyme Q-10 30 MG capsule, Take 200 mg by mouth daily.   DULoxetine (CYMBALTA) 20 MG capsule, TAKE 2 CAPSULES BY MOUTH EVERY DAY   fish oil-omega-3 fatty acids 1000 MG capsule, Take 2 g by mouth daily.    L-ARGININE-500 PO, Take 400 mg by mouth.  Daily   Multiple Vitamins-Minerals (CULTURELLE PROBIOTICS + MULTIV PO), Take by mouth daily.   Probiotic, Lactobacillus, CAPS, Take by mouth.   Quercetin 250 MG TABS, Take 500 mg by mouth daily.   Quercetin Dihydrate POWD, Take by mouth.   vitamin A 10000 UNIT capsule, Take 3,000 Units by mouth daily.   vitamin C (ASCORBIC ACID) 500 MG tablet, Take 500 mg by mouth daily.   Vitamin D, Ergocalciferol, (DRISDOL) 1.25 MG (50000 UNIT) CAPS capsule, Take 1 capsule (50,000 Units total) by mouth every 7 (seven) days.   Reviewed prior external information including notes and imaging from  primary care provider As well as notes that were available from care everywhere and other healthcare systems.  Past medical history, social, surgical and family history all reviewed in electronic medical record.  No pertanent information unless stated regarding to the chief complaint.   Review of Systems:  No headache, visual changes, nausea, vomiting, diarrhea, constipation, dizziness, abdominal pain, skin rash, fevers, chills, night sweats, weight loss, swollen lymph nodes, body aches, joint swelling, chest pain, shortness of breath, mood changes. POSITIVE muscle aches  Objective  Blood pressure 112/62, pulse 72, height '5\' 10"'$  (1.778 m), weight 175 lb (79.4 kg), SpO2 97 %.   General: No apparent distress alert and oriented x3 mood and affect normal, dressed appropriately.  HEENT: Pupils equal, extraocular movements intact  Respiratory: Patient's speak in full sentences and does not appear short of breath  Cardiovascular: No lower extremity edema, non tender, no erythema  Neck exam does have some loss of lordosis.  Some tenderness to palpation noted.  Very mild some limited sidebending at all.  More tightness on the right greater than left. Right shoulder does have improvement in range of motion but still has a positive crossover.  Left knee continues to have instability with valgus and varus force but no  significant swelling at the moment.  Osteopathic findings C2 flexed rotated and side bent right C4 flexed rotated and side bent left C6 flexed rotated and side bent left T3 extended rotated and side bent right inhaled third rib L2 flexed rotated and side bent left Sacrum right on right     Impression and Recommendations:     The above documentation has been reviewed and is accurate and complete [name], DO

## 2023-01-27 ENCOUNTER — Ambulatory Visit: Payer: Self-pay

## 2023-01-27 ENCOUNTER — Ambulatory Visit (INDEPENDENT_AMBULATORY_CARE_PROVIDER_SITE_OTHER): Payer: Medicare Other | Admitting: Family Medicine

## 2023-01-27 ENCOUNTER — Encounter: Payer: Self-pay | Admitting: Family Medicine

## 2023-01-27 VITALS — BP 112/62 | HR 72 | Ht 70.0 in | Wt 175.0 lb

## 2023-01-27 DIAGNOSIS — M9901 Segmental and somatic dysfunction of cervical region: Secondary | ICD-10-CM

## 2023-01-27 DIAGNOSIS — M9902 Segmental and somatic dysfunction of thoracic region: Secondary | ICD-10-CM

## 2023-01-27 DIAGNOSIS — M9908 Segmental and somatic dysfunction of rib cage: Secondary | ICD-10-CM

## 2023-01-27 DIAGNOSIS — M25511 Pain in right shoulder: Secondary | ICD-10-CM

## 2023-01-27 DIAGNOSIS — M9903 Segmental and somatic dysfunction of lumbar region: Secondary | ICD-10-CM | POA: Diagnosis not present

## 2023-01-27 DIAGNOSIS — M9904 Segmental and somatic dysfunction of sacral region: Secondary | ICD-10-CM | POA: Diagnosis not present

## 2023-01-27 DIAGNOSIS — M5030 Other cervical disc degeneration, unspecified cervical region: Secondary | ICD-10-CM

## 2023-01-27 DIAGNOSIS — M1712 Unilateral primary osteoarthritis, left knee: Secondary | ICD-10-CM | POA: Diagnosis not present

## 2023-01-27 NOTE — Patient Instructions (Signed)
Good to see you Start increasing training See me in 6-8 weeks

## 2023-01-27 NOTE — Assessment & Plan Note (Signed)
Significant improvement in range of motion at this time.  Discussed with patient regimen and home exercises, patient does do triathlons and we discussed proper exercises at home exercises that I think will be beneficial.  Increase activity slowly overall.  Follow-up with me again in 6 to 8 weeks.

## 2023-01-27 NOTE — Assessment & Plan Note (Signed)
Degenerative disc disease that is fairly severe at this moment.  Discussed home exercises and icing regimen.  Discussed proper posture and ergonomics.  Follow-up again in 6 to 8 weeks

## 2023-01-27 NOTE — Assessment & Plan Note (Signed)
Instability noted, encouraged him to go back to the brace that was given for more stability at the moment.  Follow-up again in 6 to 8 weeks.

## 2023-01-27 NOTE — Assessment & Plan Note (Signed)
   Decision today to treat with OMT was based on Physical Exam  After verbal consent patient was treated with HVLA, ME, FPR techniques in cervical, thoracic, rib, lumbar and sacral areas, all areas are chronic, due to patient's neck severity of arthritis only muscle energy was done  Patient tolerated the procedure well with improvement in symptoms  Patient given exercises, stretches and lifestyle modifications  See medications in patient instructions if given  Patient will follow up in 6-8 weeks

## 2023-02-16 ENCOUNTER — Encounter: Payer: Self-pay | Admitting: Family Medicine

## 2023-03-21 DIAGNOSIS — I739 Peripheral vascular disease, unspecified: Secondary | ICD-10-CM | POA: Diagnosis not present

## 2023-03-21 DIAGNOSIS — E559 Vitamin D deficiency, unspecified: Secondary | ICD-10-CM | POA: Diagnosis not present

## 2023-03-21 DIAGNOSIS — N528 Other male erectile dysfunction: Secondary | ICD-10-CM | POA: Diagnosis not present

## 2023-03-21 DIAGNOSIS — Z125 Encounter for screening for malignant neoplasm of prostate: Secondary | ICD-10-CM | POA: Diagnosis not present

## 2023-03-21 DIAGNOSIS — Z0000 Encounter for general adult medical examination without abnormal findings: Secondary | ICD-10-CM | POA: Diagnosis not present

## 2023-03-21 DIAGNOSIS — D518 Other vitamin B12 deficiency anemias: Secondary | ICD-10-CM | POA: Diagnosis not present

## 2023-03-21 DIAGNOSIS — E7800 Pure hypercholesterolemia, unspecified: Secondary | ICD-10-CM | POA: Diagnosis not present

## 2023-03-21 NOTE — Progress Notes (Unsigned)
[facility] [address] [place] [address] Phone: [phone] Subjective:   Bruce Donath, am serving as a scribe for Dr. Antoine Primas.  I'm seeing this patient by the request  of:  Johny Blamer, MD  CC: Back and neck pain follow-up  [id number]  Paul [name] is a 72 y.o. male coming in with complaint of back and neck pain. OMT 01/27/2023. Also f/u for L knee and R shoulder pain. Patient states that his shoulder pain is improving but he still has pain with flexion. Has not been able to swim for past 3 weeks due to pain. Ionto recommended by PT.   Pain in R side lumbar spine. Has been moving and feels that this exacerbated his back pain. Denies any radiating symptoms.   Custom knee brace seems to provide him with some relief. Has manageable soreness.       Medications patient has been prescribed: None  Taking:         Reviewed prior external information including notes and imaging from previsou exam, outside providers and external EMR if available.   As well as notes that were available from care everywhere and other healthcare systems.  Past medical history, social, surgical and family history all reviewed in electronic medical record.  No pertanent information unless stated regarding to the chief complaint.   Past Medical History:  Diagnosis Date   Cataract    Depression    RESOLVED   Hyperlipidemia    Spinal stenosis    WAS HAVING NECK PAIN -PT STATES NERVE ABLATION PROCEDURE 2012 AND PAIN HAS RESOLVED   Spondylitis ([facility])     Allergies  Allergen Reactions   Crestor [Rosuvastatin]     MUSCLE CRAMPS   Lipitor [Atorvastatin]     Liver toxicity     Review of Systems:  No headache, visual changes, nausea, vomiting, diarrhea, constipation, dizziness, abdominal pain, skin rash, fevers, chills, night sweats, weight loss, swollen lymph nodes, body aches, joint swelling, chest pain, shortness of breath, mood changes.  POSITIVE muscle aches  Objective  Blood pressure 112/78, pulse 61, height 5\' 10"  (1.778 m), weight 174 lb (78.9 kg), SpO2 97 %.   General: No apparent distress alert and oriented x3 mood and affect normal, dressed appropriately.  HEENT: Pupils equal, extraocular movements intact  Respiratory: Patient's speak in full sentences and does not appear short of breath  Cardiovascular: No lower extremity edema, non tender, no erythema  Right shoulder exam does have some loss of lordosis noted.  Patient does have positive impingement noted.  Positive crossover noted.  Tender to palpation noted over the acromioclavicular joint  Low back does have tightness noted.  Seems to be mostly around the right paraspinal musculature of the lumbar spine and the right sacroiliac joint.  Positive FABER test noted.  Procedure: Real-time Ultrasound Guided Injection of right acromioclavicular joint Device: GE Logiq Q7 Ultrasound guided injection is preferred based studies that show increased duration, increased effect, greater accuracy, decreased procedural pain, increased response rate, and decreased cost with ultrasound guided versus blind injection.  Verbal informed consent obtained.  Time-out conducted.  Noted no overlying erythema, induration, or other signs of local infection.  Skin prepped in a sterile fashion.  Local anesthesia: Topical Ethyl chloride.  With sterile technique and under real time ultrasound guidance: With a 25-gauge half inch needle injected with 0.5 cc of 0.5% Marcaine and 0.5 cc of Kenalog 40 mg/mL Completed without difficulty  Pain immediately resolved suggesting  accurate placement of the medication.  Advised to call if fevers/chills, erythema, induration, drainage, or persistent bleeding.  Impression: Technically successful ultrasound guided injection.  Osteopathic findings  C2 flexed rotated and side bent right C6 flexed rotated and side bent left T3 extended rotated and side bent  right inhaled rib T9 extended rotated and side bent left L2 flexed rotated and side bent right Sacrum right on right       Assessment and Plan:  AC joint pain Discussed icing regimen and home exercises, discussed which activities to do and which ones to avoid.  Hopefully this will make significant improvement.  Would like to consider the possibility of repeating a shoulder injection if this continues as well.    Nonallopathic problems  Decision today to treat with OMT was based on Physical Exam  After verbal consent patient was treated with HVLA, ME, FPR techniques in cervical, rib, thoracic, lumbar, and sacral  areas  Patient tolerated the procedure well with improvement in symptoms  Patient given exercises, stretches and lifestyle modifications  See medications in patient instructions if given  Patient will follow up in 4-8 weeks     The above documentation has been reviewed and is accurate and complete Judi Saa, DO         Note: This dictation was prepared with Dragon dictation along with smaller phrase technology. Any transcriptional errors that result from this process are unintentional.

## 2023-03-24 ENCOUNTER — Ambulatory Visit (INDEPENDENT_AMBULATORY_CARE_PROVIDER_SITE_OTHER): Payer: Medicare Other

## 2023-03-24 ENCOUNTER — Other Ambulatory Visit: Payer: Self-pay

## 2023-03-24 ENCOUNTER — Encounter: Payer: Self-pay | Admitting: Family Medicine

## 2023-03-24 ENCOUNTER — Ambulatory Visit (INDEPENDENT_AMBULATORY_CARE_PROVIDER_SITE_OTHER): Payer: Medicare Other | Admitting: Family Medicine

## 2023-03-24 VITALS — BP 112/78 | HR 61 | Ht 70.0 in | Wt 174.0 lb

## 2023-03-24 DIAGNOSIS — M9903 Segmental and somatic dysfunction of lumbar region: Secondary | ICD-10-CM | POA: Diagnosis not present

## 2023-03-24 DIAGNOSIS — M9901 Segmental and somatic dysfunction of cervical region: Secondary | ICD-10-CM

## 2023-03-24 DIAGNOSIS — M9908 Segmental and somatic dysfunction of rib cage: Secondary | ICD-10-CM

## 2023-03-24 DIAGNOSIS — M25511 Pain in right shoulder: Secondary | ICD-10-CM | POA: Diagnosis not present

## 2023-03-24 DIAGNOSIS — M9904 Segmental and somatic dysfunction of sacral region: Secondary | ICD-10-CM

## 2023-03-24 DIAGNOSIS — M19011 Primary osteoarthritis, right shoulder: Secondary | ICD-10-CM | POA: Diagnosis not present

## 2023-03-24 DIAGNOSIS — M9902 Segmental and somatic dysfunction of thoracic region: Secondary | ICD-10-CM | POA: Diagnosis not present

## 2023-03-24 NOTE — Patient Instructions (Addendum)
Xray today Injected shoulder  Make Lynden Ang do all the lifting on the move See me again in 6 weeks

## 2023-03-24 NOTE — Assessment & Plan Note (Signed)
Discussed icing regimen and home exercises, discussed which activities to do and which ones to avoid.  Hopefully this will make significant improvement.  Would like to consider the possibility of repeating a shoulder injection if this continues as well.

## 2023-03-27 ENCOUNTER — Encounter: Payer: Self-pay | Admitting: Family Medicine

## 2023-03-27 ENCOUNTER — Other Ambulatory Visit: Payer: Self-pay

## 2023-03-27 DIAGNOSIS — M5416 Radiculopathy, lumbar region: Secondary | ICD-10-CM

## 2023-03-28 ENCOUNTER — Other Ambulatory Visit: Payer: Self-pay | Admitting: Family Medicine

## 2023-04-08 ENCOUNTER — Other Ambulatory Visit: Payer: Medicare Other

## 2023-04-09 ENCOUNTER — Other Ambulatory Visit: Payer: Self-pay | Admitting: Family Medicine

## 2023-04-22 DIAGNOSIS — Z23 Encounter for immunization: Secondary | ICD-10-CM | POA: Diagnosis not present

## 2023-05-02 NOTE — Progress Notes (Unsigned)
[facility] [address] [place] [address] Phone: [phone] Subjective:   Paul [name], am serving as a scribe for Dr. Antoine Primas.  I'm seeing this patient by the request  of:  Noberto Retort, MD  CC: Neck pain, elbow pain, low back pain  [id number]  Paul [name] is a 72 y.o. male coming in with complaint of back and neck pain. OMT 03/24/2023. Patient states that he is having R sided neck and shoulder pain. Constant pain. Has not swam for one month due to soreness in tricep. Pain increases in neck with extension and rotation to the right.   Also notes pain in lateral epicondyle of elbow. Unable to grip   Pain in back has resolved post adjustment last visit.   Medications patient has been prescribed: Vit D, Cymbalta  Taking:         Reviewed prior external information including notes and imaging from previsou exam, outside providers and external EMR if available.   As well as notes that were available from care everywhere and other healthcare systems.  Past medical history, social, surgical and family history all reviewed in electronic medical record.  No pertanent information unless stated regarding to the chief complaint.   Past Medical History:  Diagnosis Date   Cataract    Depression    RESOLVED   Hyperlipidemia    Spinal stenosis    WAS HAVING NECK PAIN -PT STATES NERVE ABLATION PROCEDURE 2012 AND PAIN HAS RESOLVED   Spondylitis ([facility])     Allergies  Allergen Reactions   Crestor [Rosuvastatin]     MUSCLE CRAMPS   Lipitor [Atorvastatin]     Liver toxicity     Review of Systems:  No headache, visual changes, nausea, vomiting, diarrhea, constipation, dizziness, abdominal pain, skin rash, fevers, chills, night sweats, weight loss, swollen lymph nodes, body aches, joint swelling, chest pain, shortness of breath, mood changes. POSITIVE muscle aches  Objective  Blood pressure 118/78, pulse (!) 58, height 5'  10" (1.778 m), weight 171 lb (77.6 kg), SpO2 98 %.   General: No apparent distress alert and oriented x3 mood and affect normal, dressed appropriately.  HEENT: Pupils equal, extraocular movements intact  Respiratory: Patient's speak in full sentences and does not appear short of breath  Cardiovascular: No lower extremity edema, non tender, no erythema  Right elbow severe tenderness to palpation with swelling noted over the lateral epicondylar area.  Worsening pain with extension noted.  Osteopathic findings  C2 flexed rotated and side bent right C3 flexed rotated and side bent left C4 flexed rotated and side bent right C6 flexed rotated and side bent left T3 extended rotated and side bent right inhaled rib T9 extended rotated and side bent left L2 flexed rotated and side bent right Sacrum right on right       Assessment and Plan:  Degenerative disc disease, cervical Severe and worsening symptoms.  Discussed with patient about icing regimen and home exercises, discussed with patient avoid certain positioning when doing different activities.  Has responded well to an epidural nearly 1 year ago and I do feel that repeating would be helpful at this time.  Discussed with patient about icing regimen and home exercises.  Discussed which activities to do and which ones to avoid.  Follow-up with me again in 6 to 8 weeks  Right lateral epicondylitis Elbow anatomy was reviewed, and tendinopathy was explained.  Pt. given a home rehab program. Start with isometrics  and ROM, then a series of concentric and eccentric exercises should be done starting with no weight, work up to 1 lb, hammer, etc.  Use counterforce strap if working or using hands.  Formal PT would be beneficial. Emphasized stretching an cross-friction massage Emphasized proper palms up lifting biomechanics to unload ECRB Wrist brace given today.  Worsening pain consider injection at follow-up    Nonallopathic  problems  Decision today to treat with OMT was based on Physical Exam  After verbal consent patient was treated with HVLA, ME, FPR techniques in cervical, rib, thoracic, lumbar, and sacral  areas  Patient tolerated the procedure well with improvement in symptoms  Patient given exercises, stretches and lifestyle modifications  See medications in patient instructions if given  Patient will follow up in 4-8 weeks    The above documentation has been reviewed and is accurate and complete Paul Saa, DO          Note: This dictation was prepared with Dragon dictation along with smaller phrase technology. Any transcriptional errors that result from this process are unintentional.

## 2023-05-05 ENCOUNTER — Ambulatory Visit (INDEPENDENT_AMBULATORY_CARE_PROVIDER_SITE_OTHER): Payer: Medicare Other | Admitting: Family Medicine

## 2023-05-05 ENCOUNTER — Encounter: Payer: Self-pay | Admitting: Family Medicine

## 2023-05-05 VITALS — BP 118/78 | HR 58 | Ht 70.0 in | Wt 171.0 lb

## 2023-05-05 DIAGNOSIS — M9902 Segmental and somatic dysfunction of thoracic region: Secondary | ICD-10-CM | POA: Diagnosis not present

## 2023-05-05 DIAGNOSIS — M9904 Segmental and somatic dysfunction of sacral region: Secondary | ICD-10-CM

## 2023-05-05 DIAGNOSIS — M5412 Radiculopathy, cervical region: Secondary | ICD-10-CM

## 2023-05-05 DIAGNOSIS — M9901 Segmental and somatic dysfunction of cervical region: Secondary | ICD-10-CM

## 2023-05-05 DIAGNOSIS — M9903 Segmental and somatic dysfunction of lumbar region: Secondary | ICD-10-CM | POA: Diagnosis not present

## 2023-05-05 DIAGNOSIS — M7711 Lateral epicondylitis, right elbow: Secondary | ICD-10-CM | POA: Diagnosis not present

## 2023-05-05 DIAGNOSIS — M9908 Segmental and somatic dysfunction of rib cage: Secondary | ICD-10-CM | POA: Diagnosis not present

## 2023-05-05 DIAGNOSIS — M5030 Other cervical disc degeneration, unspecified cervical region: Secondary | ICD-10-CM | POA: Diagnosis not present

## 2023-05-05 NOTE — Assessment & Plan Note (Signed)
Elbow anatomy was reviewed, and tendinopathy was explained.  Pt. given a home rehab program. Start with isometrics and ROM, then a series of concentric and eccentric exercises should be done starting with no weight, work up to 1 lb, hammer, etc.  Use counterforce strap if working or using hands.  Formal PT would be beneficial. Emphasized stretching an cross-friction massage Emphasized proper palms up lifting biomechanics to unload ECRB Wrist brace given today.  Worsening pain consider injection at follow-up

## 2023-05-05 NOTE — Assessment & Plan Note (Signed)
Severe and worsening symptoms.  Discussed with patient about icing regimen and home exercises, discussed with patient avoid certain positioning when doing different activities.  Has responded well to an epidural nearly 1 year ago and I do feel that repeating would be helpful at this time.  Discussed with patient about icing regimen and home exercises.  Discussed which activities to do and which ones to avoid.  Follow-up with me again in 6 to 8 weeks

## 2023-05-05 NOTE — Patient Instructions (Signed)
Wrist brace Exercises Avoid overhead lifting Epidural [phone] See me in 7-8 weeks

## 2023-05-09 NOTE — Discharge Instructions (Addendum)
Post Procedure Spinal Discharge Instruction Sheet  You may resume a regular diet and any medications that you routinely take (including pain medications) unless otherwise noted by MD.  No driving day of procedure.  Light activity throughout the rest of the day.  Do not do any strenuous work, exercise, bending or lifting.  The day following the procedure, you can resume normal physical activity but you should refrain from exercising or physical therapy for at least three days thereafter.  You may apply ice to the injection site, 20 minutes on, 20 minutes off, as needed. Do not apply ice directly to skin.    Common Side Effects:  Headaches- take your usual medications as directed by your physician.  Increase your fluid intake.  Caffeinated beverages may be helpful.  Lie flat in bed until your headache resolves.  Restlessness or inability to sleep- you may have trouble sleeping for the next few days.  Ask your referring physician if you need any medication for sleep.  Facial flushing or redness- should subside within a few days.  Increased pain- a temporary increase in pain a day or two following your procedure is not unusual.  Take your pain medication as prescribed by your referring physician.  Leg cramps  Please contact our office at [phone] for the following symptoms: Fever greater than 100 degrees. Headaches unresolved with medication after 2-3 days. Increased swelling, pain, or redness at injection site.   Thank you for visiting [facility] today.   You may resume Aspirin after procedure

## 2023-05-10 ENCOUNTER — Other Ambulatory Visit: Payer: Self-pay

## 2023-05-10 ENCOUNTER — Inpatient Hospital Stay (HOSPITAL_COMMUNITY)
Admission: EM | Admit: 2023-05-10 | Discharge: 2023-05-12 | DRG: 322 | Disposition: A | Discharge status: Home / Self Care | Payer: Medicare Other | Attending: Cardiovascular Disease | Admitting: Cardiovascular Disease

## 2023-05-10 ENCOUNTER — Emergency Department (HOSPITAL_COMMUNITY): Payer: Medicare Other

## 2023-05-10 ENCOUNTER — Encounter (HOSPITAL_COMMUNITY): Payer: Self-pay | Admitting: *Deleted

## 2023-05-10 DIAGNOSIS — R079 Chest pain, unspecified: Secondary | ICD-10-CM | POA: Diagnosis not present

## 2023-05-10 DIAGNOSIS — I119 Hypertensive heart disease without heart failure: Secondary | ICD-10-CM | POA: Diagnosis present

## 2023-05-10 DIAGNOSIS — R519 Headache, unspecified: Secondary | ICD-10-CM | POA: Diagnosis present

## 2023-05-10 DIAGNOSIS — Z91148 Patient's other noncompliance with medication regimen for other reason: Secondary | ICD-10-CM

## 2023-05-10 DIAGNOSIS — Z7902 Long term (current) use of antithrombotics/antiplatelets: Secondary | ICD-10-CM

## 2023-05-10 DIAGNOSIS — I2510 Atherosclerotic heart disease of native coronary artery without angina pectoris: Secondary | ICD-10-CM | POA: Diagnosis present

## 2023-05-10 DIAGNOSIS — E782 Mixed hyperlipidemia: Secondary | ICD-10-CM | POA: Diagnosis present

## 2023-05-10 DIAGNOSIS — R0789 Other chest pain: Secondary | ICD-10-CM | POA: Diagnosis not present

## 2023-05-10 DIAGNOSIS — I214 Non-ST elevation (NSTEMI) myocardial infarction: Secondary | ICD-10-CM | POA: Diagnosis present

## 2023-05-10 DIAGNOSIS — Z955 Presence of coronary angioplasty implant and graft: Secondary | ICD-10-CM

## 2023-05-10 DIAGNOSIS — Z823 Family history of stroke: Secondary | ICD-10-CM | POA: Diagnosis not present

## 2023-05-10 DIAGNOSIS — Z888 Allergy status to other drugs, medicaments and biological substances status: Secondary | ICD-10-CM

## 2023-05-10 DIAGNOSIS — Z7982 Long term (current) use of aspirin: Secondary | ICD-10-CM

## 2023-05-10 DIAGNOSIS — I358 Other nonrheumatic aortic valve disorders: Secondary | ICD-10-CM | POA: Diagnosis present

## 2023-05-10 DIAGNOSIS — T50996A Underdosing of other drugs, medicaments and biological substances, initial encounter: Secondary | ICD-10-CM | POA: Diagnosis not present

## 2023-05-10 DIAGNOSIS — I10 Essential (primary) hypertension: Secondary | ICD-10-CM | POA: Diagnosis not present

## 2023-05-10 DIAGNOSIS — Y92009 Unspecified place in unspecified non-institutional (private) residence as the place of occurrence of the external cause: Secondary | ICD-10-CM | POA: Diagnosis not present

## 2023-05-10 DIAGNOSIS — N529 Male erectile dysfunction, unspecified: Secondary | ICD-10-CM | POA: Diagnosis present

## 2023-05-10 DIAGNOSIS — Z8249 Family history of ischemic heart disease and other diseases of the circulatory system: Secondary | ICD-10-CM | POA: Diagnosis not present

## 2023-05-10 DIAGNOSIS — R001 Bradycardia, unspecified: Secondary | ICD-10-CM | POA: Diagnosis present

## 2023-05-10 DIAGNOSIS — M791 Myalgia, unspecified site: Secondary | ICD-10-CM | POA: Diagnosis present

## 2023-05-10 DIAGNOSIS — Z79899 Other long term (current) drug therapy: Secondary | ICD-10-CM | POA: Diagnosis not present

## 2023-05-10 DIAGNOSIS — R030 Elevated blood-pressure reading, without diagnosis of hypertension: Secondary | ICD-10-CM | POA: Diagnosis present

## 2023-05-10 DIAGNOSIS — I2582 Chronic total occlusion of coronary artery: Secondary | ICD-10-CM | POA: Diagnosis present

## 2023-05-10 DIAGNOSIS — T466X6A Underdosing of antihyperlipidemic and antiarteriosclerotic drugs, initial encounter: Secondary | ICD-10-CM | POA: Diagnosis present

## 2023-05-10 DIAGNOSIS — I25110 Atherosclerotic heart disease of native coronary artery with unstable angina pectoris: Secondary | ICD-10-CM | POA: Diagnosis not present

## 2023-05-10 LAB — CBC
HCT: 43.8 % (ref 39.0–52.0)
Hemoglobin: 15.2 g/dL (ref 13.0–17.0)
MCH: 32.5 pg (ref 26.0–34.0)
MCHC: 34.7 g/dL (ref 30.0–36.0)
MCV: 93.6 fL (ref 80.0–100.0)
Platelets: 261 10*3/uL (ref 150–400)
RBC: 4.68 MIL/uL (ref 4.22–5.81)
RDW: 12.9 % (ref 11.5–15.5)
WBC: 8.4 10*3/uL (ref 4.0–10.5)
nRBC: 0 % (ref 0.0–0.2)

## 2023-05-10 LAB — BASIC METABOLIC PANEL
Anion gap: 15 (ref 5–15)
BUN: 22 mg/dL (ref 8–23)
CO2: 23 mmol/L (ref 22–32)
Calcium: 10.1 mg/dL (ref 8.9–10.3)
Chloride: 102 mmol/L (ref 98–111)
Creatinine, Ser: 1.1 mg/dL (ref 0.61–1.24)
GFR, Estimated: >60 mL/min (ref >60)
Glucose, Bld: 187 mg/dL — ABNORMAL HIGH (ref 70–99)
Potassium: 4.4 mmol/L (ref 3.5–5.1)
Sodium: 140 mmol/L (ref 135–145)

## 2023-05-10 LAB — TROPONIN I (HIGH SENSITIVITY)
Troponin I (High Sensitivity): 13 ng/L (ref <18)
Troponin I (High Sensitivity): 53 ng/L — ABNORMAL HIGH (ref <18)

## 2023-05-10 MED ORDER — ROSUVASTATIN CALCIUM 20 MG PO TABS
20.0000 mg | ORAL_TABLET | Freq: Every day | ORAL | Status: DC
  Administered 2023-05-10 – 2023-05-11 (×2): 20 mg via ORAL
  Filled 2023-05-10 (×2): qty 1

## 2023-05-10 MED ORDER — NITROGLYCERIN 0.4 MG SL SUBL: 0.4000 mg | SUBLINGUAL_TABLET | SUBLINGUAL | Status: DC | PRN

## 2023-05-10 MED ORDER — ASPIRIN 81 MG PO TBEC
81.0000 mg | DELAYED_RELEASE_TABLET | Freq: Every day | ORAL | Status: DC
  Administered 2023-05-11: 81 mg via ORAL
  Filled 2023-05-10: qty 1

## 2023-05-10 MED ORDER — NITROGLYCERIN 0.4 MG SL SUBL
0.4000 mg | SUBLINGUAL_TABLET | SUBLINGUAL | Status: DC | PRN
  Administered 2023-05-10 (×2): 0.4 mg via SUBLINGUAL
  Filled 2023-05-10: qty 1

## 2023-05-10 MED ORDER — ASPIRIN 81 MG PO CHEW
324.0000 mg | CHEWABLE_TABLET | Freq: Once | ORAL | Status: AC
  Administered 2023-05-10: 324 mg via ORAL
  Filled 2023-05-10: qty 4

## 2023-05-10 MED ORDER — ACETAMINOPHEN 325 MG PO TABS
650.0000 mg | ORAL_TABLET | ORAL | Status: DC | PRN
  Administered 2023-05-10 – 2023-05-12 (×4): 650 mg via ORAL
  Filled 2023-05-10 (×4): qty 2

## 2023-05-10 MED ORDER — HEPARIN (PORCINE) 25000 UT/250ML-% IV SOLN
1350.0000 [IU]/h | INTRAVENOUS | Status: DC
  Administered 2023-05-10: 1000 [IU]/h via INTRAVENOUS
  Administered 2023-05-11: 1200 [IU]/h via INTRAVENOUS
  Filled 2023-05-10 (×3): qty 250

## 2023-05-10 MED ORDER — SODIUM CHLORIDE 0.9 % IV SOLN: Freq: Once | INTRAVENOUS | Status: AC

## 2023-05-10 MED ORDER — ONDANSETRON HCL 4 MG/2ML IJ SOLN
4.0000 mg | Freq: Once | INTRAMUSCULAR | Status: AC
  Administered 2023-05-10: 4 mg via INTRAVENOUS
  Filled 2023-05-10: qty 2

## 2023-05-10 MED ORDER — HEPARIN BOLUS VIA INFUSION
4000.0000 [IU] | Freq: Once | INTRAVENOUS | Status: AC
  Administered 2023-05-10: 4000 [IU] via INTRAVENOUS
  Filled 2023-05-10: qty 4000

## 2023-05-10 MED ORDER — ONDANSETRON HCL 4 MG/2ML IJ SOLN: 4.0000 mg | Freq: Four times a day (QID) | INTRAMUSCULAR | Status: DC | PRN

## 2023-05-10 NOTE — Progress Notes (Signed)
ANTICOAGULATION CONSULT NOTE - Initial Consult  Pharmacy Consult for Heparin Indication: chest pain/ACS  Allergies  Allergen Reactions   Crestor [Rosuvastatin]     MUSCLE CRAMPS   Lipitor [Atorvastatin]     Liver toxicity    Patient Measurements: Height: 5' 10.5" (179.1 cm) Weight: 74.8 kg (165 lb) IBW/kg (Calculated) : 74.15 Heparin Dosing Weight: 74.8 kg  Vital Signs: Temp: 97.8 F (36.6 C) (06/22 1635) Temp Source: Oral (06/22 1635) BP: 148/84 (06/22 1600) Pulse Rate: 48 (06/22 1600)  Labs: Recent Labs    05/10/23 1219 05/10/23 1541  HGB 15.2  --   HCT 43.8  --   PLT 261  --   CREATININE 1.10  --   TROPONINIHS 13 53*    Estimated Creatinine Clearance: 62.8 mL/min (by C-G formula based on SCr of 1.1 mg/dL).   Medical History: Past Medical History:  Diagnosis Date   Cataract    Depression    RESOLVED   Hyperlipidemia    Spinal stenosis    WAS HAVING NECK PAIN -PT STATES NERVE ABLATION PROCEDURE 2012 AND PAIN HAS RESOLVED   Spondylitis ([facility])     Medications:  (Not in a hospital admission)  Scheduled:  Infusions:  PRN: nitroGLYCERIN  Assessment: 71 yom with a history of HLD, CAD. Patient is presenting with chest pain. Heparin per pharmacy consult placed for chest pain/ACS.  Patient is not on anticoagulation prior to arrival.  Hgb 15.2; plt 261  Goal of Therapy:  Heparin level 0.3-0.7 units/ml Monitor platelets by anticoagulation protocol: Yes   Plan:  Give IV heparin 4000 units bolus x 1 Start heparin infusion at 1000 units/hr Check anti-Xa level in 6 hours and daily while on heparin Continue to monitor H&H and platelets  Delmar Landau, PharmD, BCPS 05/10/2023 5:40 PM ED Clinical Pharmacist -  [phone]

## 2023-05-10 NOTE — ED Notes (Signed)
Patient transported to X-ray 
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
## 2023-05-10 NOTE — ED Notes (Signed)
ED TO INPATIENT HANDOFF REPORT  ED Nurse Name and Phone #:   S Name/Age/Gender Paul [name] 72 y.o. male Room/Bed: 024C/024C  Code Status   Code Status: Full Code  Home/SNF/Other Home Patient oriented to: self, place, time, and situation Is this baseline? Yes   Triage Complete: Triage complete  Chief Complaint NSTEMI (non-ST elevated myocardial infarction) (HCC) [I21.4]  Triage Note States he was riding his bicycle this am had been riding for 2 hours when he experienced chest pain and weakness, got off his bike rested felt better got back on the bike rode another mile and started feeling bad again stopped and called someone to come get him. C/o pressure across chest .    Allergies Allergies  Allergen Reactions   Crestor [Rosuvastatin]     MUSCLE CRAMPS   Lipitor [Atorvastatin]     Liver toxicity    Level of Care/Admitting Diagnosis ED Disposition     ED Disposition  Admit   Condition  --   Comment  Hospital Area: [facility] [[id number]]  Level of Care: Telemetry Cardiac [[id number]]  May admit patient to [facility] or [facility] if equivalent level of care is available:: Yes  Covid Evaluation: Asymptomatic - no recent exposure (last 10 days) testing not required  Diagnosis: NSTEMI (non-ST elevated myocardial infarction) [facility]) [[id number]]  Admitting Physician: Regan Rakers [[id number]]  Attending Physician: Regan Rakers [[id number]]  Certification:: I certify this patient will need inpatient services for at least 2 midnights  Estimated Length of Stay: 2          B Medical/Surgery History Past Medical History:  Diagnosis Date   Cataract    Depression    RESOLVED   Hyperlipidemia    Spinal stenosis    WAS HAVING NECK PAIN -PT STATES NERVE ABLATION PROCEDURE 2012 AND PAIN HAS RESOLVED   Spondylitis (HCC)    Past Surgical History:  Procedure Laterality Date   APPENDECTOMY  1962   HERNIA REPAIR     INSERTION OF MESH   10/06/2012   Procedure: INSERTION OF MESH;  Surgeon: [name]. Corliss Skains, MD;  Location: [facility];  Service: General;  Laterality: N/A;   KNEE SURGERY  1980 & 1982   left   TONSILLECTOMY  1968   UMBILICAL HERNIA REPAIR  10/06/2012   Procedure: HERNIA REPAIR UMBILICAL ADULT;  Surgeon: [name]. Corliss Skains, MD;  Location: [facility];  Service: General;  Laterality: N/A;     A IV Location/Drains/Wounds Patient Lines/Drains/Airways Status     Active Line/Drains/Airways     Name Placement date Placement time Site Days   Peripheral IV 05/10/23 20 G Right Antecubital 05/10/23  1243  Antecubital  less than 1   Peripheral IV 05/10/23 20 G 1" Left Antecubital 05/10/23  1804  Antecubital  less than 1   Incision 10/06/12 Abdomen Other (Comment) 10/06/12  0809  -- 3868            Intake/Output Last 24 hours  Intake/Output Summary (Last 24 hours) at 05/10/2023 1822 Last data filed at 05/10/2023 1805 Gross per 24 hour  Intake 557.35 ml  Output --  Net 557.35 ml    Labs/Imaging Results for orders placed or performed during the hospital encounter of 05/10/23 (from the past 48 hour(s))  Basic metabolic panel     Status: Abnormal   Collection Time: 05/10/23 12:19 PM  Result Value Ref Range   Sodium 140 135 - 145 mmol/L   Potassium 4.4 3.5 -  5.1 mmol/L   Chloride 102 98 - 111 mmol/L   CO2 23 22 - 32 mmol/L   Glucose, Bld 187 (H) 70 - 99 mg/dL    Comment: Glucose reference range applies only to samples taken after fasting for at least 8 hours.   BUN 22 8 - 23 mg/dL   Creatinine, Ser 1.61 0.61 - 1.24 mg/dL   Calcium 09.6 8.9 - 04.5 mg/dL   GFR, Estimated >40 >98 mL/min    Comment: (NOTE) Calculated using the CKD-EPI Creatinine Equation (2021)    Anion gap 15 5 - 15    Comment: Performed at [facility], [address] [place], [place] [address]  CBC     Status: None   Collection Time: 05/10/23 12:19 PM  Result Value Ref Range   WBC 8.4 4.0 - 10.5 K/uL   RBC 4.68 4.22 - 5.81 MIL/uL    Hemoglobin 15.2 13.0 - 17.0 g/dL   HCT 78.2 95.6 - 21.3 %   MCV 93.6 80.0 - 100.0 fL   MCH 32.5 26.0 - 34.0 pg   MCHC 34.7 30.0 - 36.0 g/dL   RDW 08.6 57.8 - 46.9 %   Platelets 261 150 - 400 K/uL   nRBC 0.0 0.0 - 0.2 %    Comment: Performed at [facility], [address] [place], [place] [address]  Troponin I (High Sensitivity)     Status: None   Collection Time: 05/10/23 12:19 PM  Result Value Ref Range   Troponin I (High Sensitivity) 13 <18 ng/L    Comment: (NOTE) Elevated high sensitivity troponin I (hsTnI) values and significant  changes across serial measurements may suggest ACS but many other  chronic and acute conditions are known to elevate hsTnI results.  Refer to the "Links" section for chest pain algorithms and additional  guidance. Performed at [facility], [address], [place], [place] [address]   Troponin I (High Sensitivity)     Status: Abnormal   Collection Time: 05/10/23  3:41 PM  Result Value Ref Range   Troponin I (High Sensitivity) 53 (H) <18 ng/L    Comment: RESULT CALLED TO, READ BACK BY AND VERIFIED WITH B Brehanna Deveny RN AT [phone] BY D LONG (NOTE) Elevated high sensitivity troponin I (hsTnI) values and significant  changes across serial measurements may suggest ACS but many other  chronic and acute conditions are known to elevate hsTnI results.  Refer to the "Links" section for chest pain algorithms and additional  guidance. Performed at [facility], [address], [place], [place] [address]    DG Chest 2 View  Result Date: 05/10/2023 CLINICAL DATA:  Chest pain while riding bicycle EXAM: CHEST - 2 VIEW COMPARISON:  Chest radiograph dated 05/08/2022 FINDINGS: Normal lung volumes. No focal consolidations. No pleural effusion or pneumothorax. Enlarged cardiomediastinal silhouette is likely projectional. No acute osseous abnormality. IMPRESSION: 1. No active cardiopulmonary disease. 2. Enlarged cardiomediastinal silhouette is likely  projectional. Electronically Signed   By: Agustin Cree M.D.   On: 05/10/2023 13:19    Pending Labs Unresulted Labs (From admission, onward)     Start     Ordered   05/11/23 0500  Heparin level (unfractionated)  Daily,   R      05/10/23 1742   05/11/23 0500  Lipoprotein A (LPA)  Tomorrow morning,   R        05/10/23 1808   05/11/23 0500  Hemoglobin A1c  Tomorrow morning,  R        05/10/23 1808   05/11/23 0500  TSH  Tomorrow morning,   R        05/10/23 1808   05/11/23 0500  T4, free  Tomorrow morning,   R        05/10/23 1808   05/11/23 0500  Basic metabolic panel  Tomorrow morning,   R        05/10/23 1808   05/11/23 0000  Heparin level (unfractionated)  Once-Timed,   URGENT        05/10/23 1742            Vitals/Pain Today's Vitals   05/10/23 1730 05/10/23 1738 05/10/23 1800 05/10/23 1801  BP: (!) 154/82  132/80   Pulse: (!) 55  (!) 52   Resp: 14  12   Temp:      TempSrc:      SpO2: 100%  99%   Weight:      Height:      PainSc:  1   1     Isolation Precautions No active isolations  Medications Medications  heparin ADULT infusion 100 units/mL (25000 units/223mL) (1,000 Units/hr Intravenous New Bag/Given 05/10/23 1803)  aspirin EC tablet 81 mg (has no administration in time range)  nitroGLYCERIN (NITROSTAT) SL tablet 0.4 mg (has no administration in time range)  acetaminophen (TYLENOL) tablet 650 mg (has no administration in time range)  ondansetron (ZOFRAN) injection 4 mg (has no administration in time range)  rosuvastatin (CRESTOR) tablet 20 mg (has no administration in time range)  0.9 %  sodium chloride infusion (0 mLs Intravenous Stopped 05/10/23 1805)  ondansetron (ZOFRAN) injection 4 mg (4 mg Intravenous Given 05/10/23 1322)  aspirin chewable tablet 324 mg (324 mg Oral Given 05/10/23 1737)  heparin bolus via infusion 4,000 Units (4,000 Units Intravenous Bolus from Bag 05/10/23 1803)    Mobility walks     Focused Assessments    R Recommendations: See  Admitting Provider Note  Report given to:   Additional Notes:

## 2023-05-10 NOTE — H&P (Addendum)
Cardiology Admission History and Physical   Patient ID: Paul [name] MRN: [id number]; DOB: 12-10-1950   Admission date: 05/10/2023  PCP:  Noberto Retort, MD   [facility] Providers Cardiologist:  Donato Schultz, MD        Chief Complaint:  chest pain  Patient Profile:   Paul [name] is a 72 y.o. male with HLD and FHx of CAD (father died with MI at the age of 78) who is being seen 05/10/2023 for the evaluation of chest pain.  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 Anne Fu in 2020 due to asymptomatic LAD and Lcx calcifications noted on a CT chest. He underwent a nuclear stress test afterwards which was noted to be negative for ischemia or infarction and has been doing well since then.  He is a very active individual that bikes >100 miles per week. Today while he was biking he started noticing sudden onset of pain across the chest, pressure like sensation, associated with nausea, which prompted him to stop. This had never happened to him before. The pain lasted for approx 5 min and went away. He started biking again and noticed sudden onset of symptoms after 3 miles. He decided to stop and called his wife. Pain resolved within 5 minutes again and since he was close to home he tried to keep going but with minimal exertion he developed symptoms again and decided to come in. On his way to the ER he reports being significantly short of breath, which again, is something unusual.  On arrival, resting HR between 50 and 60 bpm, BP 150/70, RR 15. Labs relevant for glucose 187, Cr 1.1, Hb 15, troponin 13 -> 53. CXR with enlarged cardiomediastinal silhouette. He was given 324 of ASA in the ED.   Past Medical History:  Diagnosis Date   Cataract    Depression    RESOLVED   Hyperlipidemia    Spinal stenosis    WAS HAVING NECK PAIN -PT STATES NERVE ABLATION PROCEDURE 2012 AND PAIN HAS RESOLVED   Spondylitis (HCC)      Past Surgical History:  Procedure Laterality Date   APPENDECTOMY  1962   HERNIA REPAIR     INSERTION OF MESH  10/06/2012   Procedure: INSERTION OF MESH;  Surgeon: [name]. Corliss Skains, MD;  Location: [facility];  Service: General;  Laterality: N/A;   KNEE SURGERY  1980 & 1982   left   TONSILLECTOMY  1968   UMBILICAL HERNIA REPAIR  10/06/2012   Procedure: HERNIA REPAIR UMBILICAL ADULT;  Surgeon: [name]. Corliss Skains, MD;  Location: [facility];  Service: General;  Laterality: N/A;     Medications Prior to Admission: Prior to Admission medications   Medication Sig Start Date End Date Taking? Authorizing Provider  albuterol (VENTOLIN HFA) 108 (90 Base) MCG/ACT inhaler Inhale 2 puffs into the lungs every 4 (four) hours as needed for wheezing or shortness of breath. 07/01/22   Judi Saa, DO  cholecalciferol (VITAMIN D3) 10 MCG (400 UNIT) TABS tablet Take 5,000 Units by mouth daily.    [provider]  co-enzyme Q-10 30 MG capsule Take 200 mg by mouth daily.    [provider]  DULoxetine (CYMBALTA) 20 MG capsule TAKE 2 CAPSULES BY MOUTH DAILY 04/10/23   Judi Saa, DO  ferrous sulfate 325 (65 FE) MG EC tablet Take 65 mg by mouth daily.    [provider]  fish oil-omega-3 fatty acids 1000 MG capsule Take 2 g by mouth daily.     [provider]  folic acid (FOLVITE) 1 MG tablet Take 800 mcg by mouth daily.    [provider]  L-ARGININE-500 PO Take 400 mg by mouth. Daily    [provider]  Multiple Vitamins-Minerals (CULTURELLE PROBIOTICS + MULTIV PO) Take by mouth daily.    [provider]  Probiotic, Lactobacillus, CAPS Take by mouth.    [provider]  Quercetin 250 MG TABS Take 500 mg by mouth daily.    [provider]  Quercetin Dihydrate POWD Take by mouth.    [provider]  rosuvastatin (CRESTOR) 20 MG tablet Take 1 tablet (20 mg total) by mouth daily. 03/24/19   Jake Bathe, MD  vitamin A  10000 UNIT capsule Take 3,000 Units by mouth daily.    [provider]  vitamin B-12 (CYANOCOBALAMIN) 1000 MCG tablet Take 1,000 mcg by mouth daily.    [provider]  vitamin C (ASCORBIC ACID) 500 MG tablet Take 500 mg by mouth daily.    [provider]  Vitamin D, Ergocalciferol, (DRISDOL) 1.25 MG (50000 UNIT) CAPS capsule TAKE 1 CAPSULE (50,000 UNITS TOTAL) BY MOUTH EVERY 7 (SEVEN) DAYS 03/31/23   Judi Saa, DO     Allergies:    Allergies  Allergen Reactions   Crestor [Rosuvastatin]     MUSCLE CRAMPS   Lipitor [Atorvastatin]     Liver toxicity    Social History:   Social History   Socioeconomic History   Marital status: Married    Spouse name: Not on file   Number of children: Not on file   Years of education: Not on file   Highest education level: Not on file  Occupational History   Not on file  Tobacco Use   Smoking status: Never   Smokeless tobacco: Never  Vaping Use   Vaping Use: Never used  Substance and Sexual Activity   Alcohol use: Yes    Comment: OCCAS ALCOHOL   Drug use: No   Sexual activity: Not on file  Other Topics Concern   Not on file  Social History Narrative   ** Merged History Encounter **       Social Determinants of Health   Financial Resource Strain: Not on file  Food Insecurity: Not on file  Transportation Needs: Not on file  Physical Activity: Not on file  Stress: Not on file  Social Connections: Not on file  Intimate Partner Violence: Not on file    Family History:   The patient's family history includes Cancer in his sister; Heart disease in his father; Stroke in his mother.    ROS:  Please see the history of present illness.  All other ROS reviewed and negative.     Physical Exam/Data:   Vitals:   05/10/23 1500 05/10/23 1530 05/10/23 1600 05/10/23 1635  BP: (!) 160/91 (!) 145/84 (!) 148/84   Pulse: 77 61 (!) 48   Resp: (!) 22 16 14    Temp:    97.8 F (36.6 C)  TempSrc:    Oral  SpO2:  98% 96% 100%   Weight:      Height:       No intake or output data in the 24 hours ending 05/10/23 1715    05/10/2023   12:22 PM 05/05/2023    8:56 AM 03/24/2023    8:45 AM  Last 3 Weights  Weight (lbs) 165 lb 171 lb 174 lb  Weight (kg) 74.844 kg 77.565 kg 78.926 kg     Body mass index is 23.34 kg/m.  General:  Well nourished, well developed, in no acute distress HEENT: normal Neck: no JVD Vascular: No carotid bruits; Distal pulses 2+ bilaterally   Cardiac:  normal S1, S2;. Apical systolic murmur  Lungs:  clear to auscultation bilaterally, no wheezing, rhonchi or rales  Abd: soft, nontender, no hepatomegaly  Ext: no edema Musculoskeletal:  No deformities, BUE and BLE strength normal and equal Skin: warm and dry  Neuro:  CNs 2-12 intact, no focal abnormalities noted Psych:  Normal affect    EKG:  The ECG that was done was personally reviewed and demonstrates sinus bradycardia  Relevant CV Studies:  Laboratory Data:  High Sensitivity Troponin:   Recent Labs  Lab 05/10/23 1219 05/10/23 1541  TROPONINIHS 13 53*      Chemistry Recent Labs  Lab 05/10/23 1219  NA 140  K 4.4  CL 102  CO2 23  GLUCOSE 187*  BUN 22  CREATININE 1.10  CALCIUM 10.1  GFRNONAA >60  ANIONGAP 15    No results for input(s): "PROT", "ALBUMIN", "AST", "ALT", "ALKPHOS", "BILITOT" in the last 168 hours. Lipids No results for input(s): "CHOL", "TRIG", "HDL", "LABVLDL", "LDLCALC", "CHOLHDL" in the last 168 hours. Hematology Recent Labs  Lab 05/10/23 1219  WBC 8.4  RBC 4.68  HGB 15.2  HCT 43.8  MCV 93.6  MCH 32.5  MCHC 34.7  RDW 12.9  PLT 261   Thyroid No results for input(s): "TSH", "FREET4" in the last 168 hours. BNPNo results for input(s): "BNP", "PROBNP" in the last 168 hours.  DDimer No results for input(s): "DDIMER" in the last 168 hours.   Radiology/Studies:  DG Chest 2 View  Result Date: 05/10/2023 CLINICAL DATA:  Chest pain while riding bicycle EXAM: CHEST - 2 VIEW  COMPARISON:  Chest radiograph dated 05/08/2022 FINDINGS: Normal lung volumes. No focal consolidations. No pleural effusion or pneumothorax. Enlarged cardiomediastinal silhouette is likely projectional. No acute osseous abnormality. IMPRESSION: 1. No active cardiopulmonary disease. 2. Enlarged cardiomediastinal silhouette is likely projectional. Electronically Signed   By: Agustin Cree M.D.   On: 05/10/2023 13:19     Assessment and Plan:   72 y/o male with HTN and know coronary artery calcifications in 2020, who was admitted with NSTEMI   # NSTEMI # Hypertension # Family history of CAD - Typical anginal symptoms, now chest pain free - ECG with sinus bradycardia. Cardiomegaly on CXR - Labs with uptrending troponins - Loaded with ASA in the ER  - TIMI score: 3, GRACE 107 PLAN: - Continue ASA 81 mg daily - Start IV heparin per ACS protocol - Will hold BB given resting bradycardia - Was off crestor for some time but stopped due to muscle discomfort. Two weeks ago he resumed it again and has been tolerating well. - Echo in the morning given CM on CXR and apical systolic murmur on exam - Lp(a), lipid panel, HbA1c - Plan for LHC/PCI on Monday unless pain recurs    Risk Assessment/Risk Scores:    TIMI Risk Score for Unstable Angina or Non-ST Elevation MI:   The patient's TIMI risk score is 3 which indicates a 13% risk of all cause mortality, new or recurrent myocardial infarction or need for urgent revascularization in the next 14 days.    Severity of Illness: The appropriate patient status for this patient is INPATIENT. Inpatient status is judged  to be reasonable and necessary in order to provide the required intensity of service to ensure the patient's safety. The patient's presenting symptoms, physical exam findings, and initial radiographic and laboratory data in the context of their chronic comorbidities is felt to place them at high risk for further clinical deterioration. Furthermore, it  is not anticipated that the patient will be medically stable for discharge from the hospital within 2 midnights of admission.   * I certify that at the point of admission it is my clinical judgment that the patient will require inpatient hospital care spanning beyond 2 midnights from the point of admission due to high intensity of service, high risk for further deterioration and high frequency of surveillance required.*   For questions or updates, please contact [facility] Please consult [email or web address] for contact info under     Signed, Regan Rakers, MD  05/10/2023 5:15 PM

## 2023-05-10 NOTE — ED Provider Notes (Signed)
[place] [facility]ENC[facility] DEPARTMENT AT [facility] Provider Note   CSN: [id number] Arrival date & time: 05/10/23  1206     History  Chief Complaint  Patient presents with   Chest Pain    Paul [name] is a 72 y.o. male.  72 year old male with prior medical history as detailed below presents for evaluation.  Patient reports that he has no known cardiac history.  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  He bikes approximately 100+ miles per week.  Today he was on his routine weekend bike ride when he developed nausea, diffuse chest discomfort and had to stop riding.  He rested in the shade for a couple minutes and then tried to bike again.  Symptoms recurred.  He stopped at that time and came to the ED for evaluation.  He reports feeling significantly improved upon evaluation here in the ED.  The history is provided by the patient and medical records.       Home Medications Prior to Admission medications   Medication Sig Start Date End Date Taking? Authorizing Provider  albuterol (VENTOLIN HFA) 108 (90 Base) MCG/ACT inhaler Inhale 2 puffs into the lungs every 4 (four) hours as needed for wheezing or shortness of breath. 07/01/22   Judi Saa, DO  cholecalciferol (VITAMIN D3) 10 MCG (400 UNIT) TABS tablet Take 5,000 Units by mouth daily.    [provider]  co-enzyme Q-10 30 MG capsule Take 200 mg by mouth daily.    [provider]  DULoxetine (CYMBALTA) 20 MG capsule TAKE 2 CAPSULES BY MOUTH DAILY 04/10/23   Judi Saa, DO  ferrous sulfate 325 (65 FE) MG EC tablet Take 65 mg by mouth daily.    [provider]  fish oil-omega-3 fatty acids 1000 MG capsule Take 2 g by mouth daily.     [provider]  folic acid (FOLVITE) 1 MG tablet Take 800 mcg by mouth daily.    [provider]  L-ARGININE-500 PO Take 400 mg by mouth. Daily    [provider]  Multiple Vitamins-Minerals  (CULTURELLE PROBIOTICS + MULTIV PO) Take by mouth daily.    [provider]  Probiotic, Lactobacillus, CAPS Take by mouth.    [provider]  Quercetin 250 MG TABS Take 500 mg by mouth daily.    [provider]  Quercetin Dihydrate POWD Take by mouth.    [provider]  rosuvastatin (CRESTOR) 20 MG tablet Take 1 tablet (20 mg total) by mouth daily. 03/24/19   Jake Bathe, MD  vitamin A 40981 UNIT capsule Take 3,000 Units by mouth daily.    [provider]  vitamin B-12 (CYANOCOBALAMIN) 1000 MCG tablet Take 1,000 mcg by mouth daily.    [provider]  vitamin C (ASCORBIC ACID) 500 MG tablet Take 500 mg by mouth daily.    [provider]  Vitamin D, Ergocalciferol, (DRISDOL) 1.25 MG (50000 UNIT) CAPS capsule TAKE 1 CAPSULE (50,000 UNITS TOTAL) BY MOUTH EVERY 7 (SEVEN) DAYS 03/31/23   Judi Saa, DO      Allergies    Crestor [rosuvastatin] and Lipitor [atorvastatin]    Review of Systems   Review of Systems  All other systems reviewed and are negative.   Physical Exam Updated Vital Signs BP (!) 152/85   Pulse (!) 53   Temp (!) 97.5 F (36.4 C) (Oral)   Resp 16   Ht 5' 10.5" (1.791 m)  Wt 74.8 kg   SpO2 100%   BMI 23.34 kg/m  Physical Exam Vitals and nursing note reviewed.  Constitutional:      General: He is not in acute distress.    Appearance: Normal appearance. He is well-developed.  HENT:     Head: Normocephalic and atraumatic.  Eyes:     Conjunctiva/sclera: Conjunctivae normal.     Pupils: Pupils are equal, round, and reactive to light.  Cardiovascular:     Rate and Rhythm: Normal rate and regular rhythm.     Heart sounds: Normal heart sounds.  Pulmonary:     Effort: Pulmonary effort is normal. No respiratory distress.     Breath sounds: Normal breath sounds.  Abdominal:     General: There is no distension.     Palpations: Abdomen is soft.     Tenderness: There is no abdominal tenderness.   Musculoskeletal:        General: No deformity. Normal range of motion.     Cervical back: Normal range of motion and neck supple.  Skin:    General: Skin is warm and dry.  Neurological:     General: No focal deficit present.     Mental Status: He is alert and oriented to person, place, and time.     ED Results / Procedures / Treatments   Labs (all labs ordered are listed, but only abnormal results are displayed) Labs Reviewed  BASIC METABOLIC PANEL - Abnormal; Notable for the following components:      Result Value   Glucose, Bld 187 (*)    All other components within normal limits  CBC  TROPONIN I (HIGH SENSITIVITY)    EKG EKG Interpretation  Date/Time:  Saturday May 10 2023 12:37:31 EDT Ventricular Rate:  48 PR Interval:  180 QRS Duration: 106 QT Interval:  473 QTC Calculation: 423 R Axis:   71 Text Interpretation: Sinus bradycardia Atrial premature complex Abnormal R-wave progression, early transition Left ventricular hypertrophy Minimal ST elevation, anterolateral leads Confirmed by Kristine Royal [phone]) on 05/10/2023 12:51:27 PM  Radiology DG Chest 2 View  Result Date: 05/10/2023 CLINICAL DATA:  Chest pain while riding bicycle EXAM: CHEST - 2 VIEW COMPARISON:  Chest radiograph dated 05/08/2022 FINDINGS: Normal lung volumes. No focal consolidations. No pleural effusion or pneumothorax. Enlarged cardiomediastinal silhouette is likely projectional. No acute osseous abnormality. IMPRESSION: 1. No active cardiopulmonary disease. 2. Enlarged cardiomediastinal silhouette is likely projectional. Electronically Signed   By: Agustin Cree M.D.   On: 05/10/2023 13:19    Procedures Procedures    Medications Ordered in ED Medications  0.9 %  sodium chloride infusion ( Intravenous New Bag/Given 05/10/23 1324)  ondansetron (ZOFRAN) injection 4 mg (4 mg Intravenous Given 05/10/23 1322)    ED Course/ Medical Decision Making/ A&P                             Medical Decision  Making Amount and/or Complexity of Data Reviewed Labs: ordered. Radiology: ordered.  Risk Prescription drug management.    Medical Screen Complete  This patient presented to the ED with complaint of chest pain.  This complaint involves an extensive number of treatment options. The initial differential diagnosis includes, but is not limited to, ACS, unstable angina, angina, metabolic abnormality, atypical chest pain, etc.  This presentation is: Acute, Self-Limited, Previously Undiagnosed, Uncertain Prognosis, Complicated, Systemic Symptoms, and Threat to Life/Bodily Function  Patient is active triathlete without documented prior cardiac pathology.  Patient reports onset of chest discomfort while exerting himself on his weekend bike ride.  Admittedly, patient was riding his bike in temperatures above 90 degrees.  Patient experienced chest discomfort associated with exertion which led to his coming to the ED for evaluation.  Symptoms were significantly improved upon arrival to the ED.  Initial EKG obtained is without evidence of acute ischemia.  Initial troponin is reassuring at 13.  Other obtained screening labs are without significant abnormality.  Second troponin is pending at time of transfer of care to oncoming EDP -Dr. Lynelle Doctor.   Additional history obtained:  External records from outside sources obtained and reviewed including prior ED visits and prior Inpatient records.    Lab Tests:  I ordered and personally interpreted labs.    Imaging Studies ordered:  I ordered imaging studies including chest x-ray I independently visualized and interpreted obtained imaging which showed NAD I agree with the radiologist interpretation.   Cardiac Monitoring:  The patient was maintained on a cardiac monitor.  I personally viewed and interpreted the cardiac monitor which showed an underlying rhythm of: NSR   Medicines ordered:  I ordered medication including Zofran, IV fluids  for suspected dehydration Reevaluation of the patient after these medicines showed that the patient: improved   Problem List / ED Course:  Chest pain   Reevaluation:  After the interventions noted above, I reevaluated the patient and found that they have: improved   Disposition:  After consideration of the diagnostic results and the patients response to treatment, I feel that the patent would benefit from completion of ED evaluation.          Final Clinical Impression(s) / ED Diagnoses Final diagnoses:  Chest pain, unspecified type    Rx / DC Orders ED Discharge Orders     None         Wynetta Fines, MD 05/10/23 [phone]

## 2023-05-10 NOTE — ED Provider Notes (Signed)
Patient was initially seen by Dr. Rodena Medin.  Please see his note.  Mr. Paul [name] presented with exercise-induced chest pain.  Symptoms concerning for the possibility of new onset angina.  Patient remains pain-free however his second troponin has increased from 18-53.  I reviewed the case with Dr. Danella Penton, cardiology  He will come see the patient in the ED   Linwood Dibbles, MD 05/10/23 [phone]

## 2023-05-11 ENCOUNTER — Inpatient Hospital Stay (HOSPITAL_COMMUNITY): Payer: Medicare Other

## 2023-05-11 DIAGNOSIS — I214 Non-ST elevation (NSTEMI) myocardial infarction: Secondary | ICD-10-CM

## 2023-05-11 LAB — ECHOCARDIOGRAM COMPLETE
AR max vel: 2.07 cm2
AV Area VTI: 1.92 cm2
AV Area mean vel: 1.99 cm2
AV Mean grad: 8 mmHg
AV Peak grad: 14.9 mmHg
AV Vena cont: 0.6 cm
Ao pk vel: 1.93 m/s
Area-P 1/2: 2.42 cm2
Calc EF: 53.4 %
Height: 70 in
S' Lateral: 2.8 cm
Single Plane A2C EF: 56.1 %
Single Plane A4C EF: 49 %
Weight: 2684.32 oz

## 2023-05-11 LAB — CBC
HCT: 42.3 % (ref 39.0–52.0)
Hemoglobin: 14.3 g/dL (ref 13.0–17.0)
MCH: 32.6 pg (ref 26.0–34.0)
MCHC: 33.8 g/dL (ref 30.0–36.0)
MCV: 96.4 fL (ref 80.0–100.0)
Platelets: 217 10*3/uL (ref 150–400)
RBC: 4.39 MIL/uL (ref 4.22–5.81)
RDW: 13 % (ref 11.5–15.5)
WBC: 9.4 10*3/uL (ref 4.0–10.5)
nRBC: 0 % (ref 0.0–0.2)

## 2023-05-11 LAB — BASIC METABOLIC PANEL
Anion gap: 13 (ref 5–15)
BUN: 14 mg/dL (ref 8–23)
CO2: 25 mmol/L (ref 22–32)
Calcium: 9.1 mg/dL (ref 8.9–10.3)
Chloride: 103 mmol/L (ref 98–111)
Creatinine, Ser: 0.8 mg/dL (ref 0.61–1.24)
GFR, Estimated: >60 mL/min (ref >60)
Glucose, Bld: 128 mg/dL — ABNORMAL HIGH (ref 70–99)
Potassium: 3.8 mmol/L (ref 3.5–5.1)
Sodium: 141 mmol/L (ref 135–145)

## 2023-05-11 LAB — HEPARIN LEVEL (UNFRACTIONATED)
Heparin Unfractionated: 0.19 IU/mL — ABNORMAL LOW (ref 0.30–0.70)
Heparin Unfractionated: 0.29 IU/mL — ABNORMAL LOW (ref 0.30–0.70)
Heparin Unfractionated: 0.41 IU/mL (ref 0.30–0.70)

## 2023-05-11 LAB — HEMOGLOBIN A1C
Hgb A1c MFr Bld: 5.1 % (ref 4.8–5.6)
Mean Plasma Glucose: 99.67 mg/dL

## 2023-05-11 LAB — TSH: TSH: 0.877 u[IU]/mL (ref 0.350–4.500)

## 2023-05-11 LAB — T4, FREE: Free T4: 0.83 ng/dL (ref 0.61–1.12)

## 2023-05-11 MED ORDER — ISOSORBIDE MONONITRATE ER 30 MG PO TB24
30.0000 mg | ORAL_TABLET | Freq: Every day | ORAL | Status: DC
  Administered 2023-05-11: 30 mg via ORAL
  Filled 2023-05-11: qty 1

## 2023-05-11 MED ORDER — SODIUM CHLORIDE 0.9% FLUSH: 3.0000 mL | Freq: Two times a day (BID) | INTRAVENOUS | Status: DC

## 2023-05-11 MED ORDER — ASPIRIN 81 MG PO CHEW
81.0000 mg | CHEWABLE_TABLET | ORAL | Status: AC
  Administered 2023-05-12: 81 mg via ORAL
  Filled 2023-05-11: qty 1

## 2023-05-11 MED ORDER — SODIUM CHLORIDE 0.9 % WEIGHT BASED INFUSION
3.0000 mL/kg/h | INTRAVENOUS | Status: AC
  Administered 2023-05-12: 3 mL/kg/h via INTRAVENOUS

## 2023-05-11 MED ORDER — SODIUM CHLORIDE 0.9% FLUSH: 3.0000 mL | INTRAVENOUS | Status: DC | PRN

## 2023-05-11 MED ORDER — SODIUM CHLORIDE 0.9 % WEIGHT BASED INFUSION: 1.0000 mL/kg/h | INTRAVENOUS | Status: DC

## 2023-05-11 MED ORDER — SODIUM CHLORIDE 0.9 % IV SOLN: 250.0000 mL | INTRAVENOUS | Status: DC | PRN

## 2023-05-11 NOTE — Progress Notes (Signed)
ANTICOAGULATION CONSULT NOTE - Initial Consult  Pharmacy Consult for Heparin Indication: chest pain/ACS  Allergies  Allergen Reactions   Crestor [Rosuvastatin]     MUSCLE CRAMPS   Lipitor [Atorvastatin]     Liver toxicity    Patient Measurements: Height: 5\' 10"  (177.8 cm) Weight: 76.1 kg (167 lb 12.3 oz) IBW/kg (Calculated) : 73 Heparin Dosing Weight: 74.8 kg  Vital Signs: Temp: 97.8 F (36.6 C) (06/22 2341) Temp Source: Axillary (06/22 2341) BP: 140/92 (06/23 0042) Pulse Rate: 59 (06/23 0042)  Labs: Recent Labs    05/10/23 1219 05/10/23 1541 05/11/23 0053  HGB 15.2  --   --   HCT 43.8  --   --   PLT 261  --   --   HEPARINUNFRC  --   --  0.19*  CREATININE 1.10  --   --   TROPONINIHS 13 53*  --      Estimated Creatinine Clearance: 61.8 mL/min (by C-G formula based on SCr of 1.1 mg/dL).   Medical History: Past Medical History:  Diagnosis Date   Cataract    Depression    RESOLVED   Hyperlipidemia    Spinal stenosis    WAS HAVING NECK PAIN -PT STATES NERVE ABLATION PROCEDURE 2012 AND PAIN HAS RESOLVED   Spondylitis (HCC)     Medications:  Medications Prior to Admission  Medication Sig Dispense Refill Last Dose   albuterol (VENTOLIN HFA) 108 (90 Base) MCG/ACT inhaler Inhale 2 puffs into the lungs every 4 (four) hours as needed for wheezing or shortness of breath. 18 g 3    cholecalciferol (VITAMIN D3) 10 MCG (400 UNIT) TABS tablet Take 5,000 Units by mouth daily.      co-enzyme Q-10 30 MG capsule Take 200 mg by mouth daily.      DULoxetine (CYMBALTA) 20 MG capsule TAKE 2 CAPSULES BY MOUTH DAILY 60 capsule 3    ferrous sulfate 325 (65 FE) MG EC tablet Take 65 mg by mouth daily.      fish oil-omega-3 fatty acids 1000 MG capsule Take 2 g by mouth daily.       folic acid (FOLVITE) 1 MG tablet Take 800 mcg by mouth daily.      L-ARGININE-500 PO Take 400 mg by mouth. Daily      Multiple Vitamins-Minerals (CULTURELLE PROBIOTICS + MULTIV PO) Take by mouth daily.       Probiotic, Lactobacillus, CAPS Take by mouth.      Quercetin 250 MG TABS Take 500 mg by mouth daily.      Quercetin Dihydrate POWD Take by mouth.      rosuvastatin (CRESTOR) 20 MG tablet Take 1 tablet (20 mg total) by mouth daily. 90 tablet 3    vitamin A 09811 UNIT capsule Take 3,000 Units by mouth daily.      vitamin B-12 (CYANOCOBALAMIN) 1000 MCG tablet Take 1,000 mcg by mouth daily.      vitamin C (ASCORBIC ACID) 500 MG tablet Take 500 mg by mouth daily.      Vitamin D, Ergocalciferol, (DRISDOL) 1.25 MG (50000 UNIT) CAPS capsule TAKE 1 CAPSULE (50,000 UNITS TOTAL) BY MOUTH EVERY 7 (SEVEN) DAYS 12 capsule 0     Scheduled:   aspirin EC  81 mg Oral Daily   rosuvastatin  20 mg Oral Daily   Infusions:   heparin 1,000 Units/hr (05/10/23 1803)   PRN: acetaminophen, nitroGLYCERIN, ondansetron (ZOFRAN) IV  Assessment: 71 yom with a history of HLD, CAD. Patient is presenting with chest pain.  Heparin per pharmacy consult placed for chest pain/ACS. Patient is not on anticoagulation prior to arrival.  Initial heparin level below goal: 0.19 on 1000 units/hr, no issues with infusion or overt s/sx of bleeding. CBC stable WNL  Goal of Therapy:  Heparin level 0.3-0.7 units/ml Monitor platelets by anticoagulation protocol: Yes   Plan:  Increase heparin infusion to 1200 units/hr Check anti-Xa level in 8 hours and daily while on heparin Continue to monitor H&H and platelets  Ruben Im, PharmD Clinical Pharmacist 05/11/2023 1:47 AM Please check AMION for all [facility] Pharmacy numbers

## 2023-05-11 NOTE — Progress Notes (Signed)
ANTICOAGULATION CONSULT NOTE  Pharmacy Consult for Heparin Indication: chest pain/ACS  Allergies  Allergen Reactions   Crestor [Rosuvastatin] Other (See Comments)    Myalgias  Pt currently taking 20mg  QD   Lipitor [Atorvastatin] Other (See Comments)    Liver toxicity    Patient Measurements: Height: 5\' 10"  (177.8 cm) Weight: 76.1 kg (167 lb 12.3 oz) IBW/kg (Calculated) : 73 Heparin Dosing Weight: 74.8 kg  Vital Signs: Temp: 98.1 F (36.7 C) (06/23 1628) Temp Source: Oral (06/23 1628) BP: 126/83 (06/23 1628) Pulse Rate: 73 (06/23 1215)  Labs: Recent Labs    05/10/23 1219 05/10/23 1541 05/11/23 0053 05/11/23 0317 05/11/23 1103 05/11/23 1806  HGB 15.2  --   --   --   --  14.3  HCT 43.8  --   --   --   --  42.3  PLT 261  --   --   --   --  217  HEPARINUNFRC  --   --  0.19*  --  0.29* 0.41  CREATININE 1.10  --   --  0.80  --   --   TROPONINIHS 13 53*  --   --   --   --      Estimated Creatinine Clearance: 84.9 mL/min (by C-G formula based on SCr of 0.8 mg/dL).   Medical History: Past Medical History:  Diagnosis Date   Cataract    Depression    RESOLVED   Hyperlipidemia    Spinal stenosis    WAS HAVING NECK PAIN -PT STATES NERVE ABLATION PROCEDURE 2012 AND PAIN HAS RESOLVED   Spondylitis (HCC)     Medications:  Medications Prior to Admission  Medication Sig Dispense Refill Last Dose   DULoxetine (CYMBALTA) 20 MG capsule TAKE 2 CAPSULES BY MOUTH DAILY 60 capsule 3 Past Week   rosuvastatin (CRESTOR) 20 MG tablet Take 1 tablet (20 mg total) by mouth daily. 90 tablet 3 Past Week   sildenafil (VIAGRA) 100 MG tablet Take 100 mg by mouth daily as needed for erectile dysfunction.   Past Month   Vitamin D, Ergocalciferol, (DRISDOL) 1.25 MG (50000 UNIT) CAPS capsule TAKE 1 CAPSULE (50,000 UNITS TOTAL) BY MOUTH EVERY 7 (SEVEN) DAYS (Patient taking differently: Take 50,000 Units by mouth every Wednesday.) 12 capsule 0 Past Week    Scheduled:   aspirin EC  81 mg  Oral Daily   isosorbide mononitrate  30 mg Oral Daily   rosuvastatin  20 mg Oral Daily   Infusions:   heparin 1,350 Units/hr (05/11/23 1212)   PRN: acetaminophen, nitroGLYCERIN, ondansetron (ZOFRAN) IV  Assessment: 71 yom with a history of HLD, CAD. Patient is presenting with chest pain. Heparin per pharmacy consult placed for chest pain/ACS. Patient is not on anticoagulation prior to arrival.  PM UPDATE: Heparin level therapeutic at 0.41 after rate increase this AM to heparin 1350 units/hr (drawn ~2 hours early). CBC remains stable. No issues with heparin infusing or bleeding noted per d/w RN.  Goal of Therapy:  Heparin level 0.3-0.7 units/ml Monitor platelets by anticoagulation protocol: Yes   Plan:  Continue heparin infusion at 1350 units/hr Daily heparin level and CBC F/u [facility] plans post cath  Rexford Maus, PharmD, BCPS 05/11/2023 6:41 PM

## 2023-05-11 NOTE — Progress Notes (Signed)
*  PRELIMINARY RESULTS* Echocardiogram 2D Echocardiogram has been performed.  Paul [name] 05/11/2023, 3:25 PM

## 2023-05-11 NOTE — Progress Notes (Addendum)
Progress Note  Patient Name: Paul [name] Date of Encounter: 05/11/2023  Primary Cardiologist: Donato Schultz, MD     Patient Profile     72 y.o. male admitted with chest pain which occurred while riding his bicycle relieved with rest and then recurred with resuming.  known LAD LAD and circumflex calcifications, negative Myoview 2020, Tn 13->53 Echo pending Subjective   No chest pain   Inpatient Medications    Scheduled Meds:  aspirin EC  81 mg Oral Daily   rosuvastatin  20 mg Oral Daily   Continuous Infusions:  heparin 1,200 Units/hr (05/11/23 0458)   PRN Meds: acetaminophen, nitroGLYCERIN, ondansetron (ZOFRAN) IV   Vital Signs    Vitals:   05/11/23 0441 05/11/23 0442 05/11/23 0444 05/11/23 0712  BP:  (!) 149/87  (!) 144/88  Pulse: 60 63 65 67  Resp: 13 14 14 18   Temp:  98.1 F (36.7 C)  98 F (36.7 C)  TempSrc:  Oral  Oral  SpO2: 97% 97% 93% 98%  Weight:      Height:        Intake/Output Summary (Last 24 hours) at 05/11/2023 0742 Last data filed at 05/11/2023 0458 Gross per 24 hour  Intake 711.47 ml  Output 600 ml  Net 111.47 ml   Filed Weights   05/10/23 1222 05/10/23 1859 05/10/23 1900  Weight: 74.8 kg 76.1 kg 76.1 kg    Telemetry    VT NS - Personally Reviewed  ECG    Sinus bradycardia PACs no acute changes- Personally Reviewed  Physical Exam    GEN: No acute distress.   Neck: No JVD; carotids brisk Cardiac: RRR, 2/6 midsystolic, rubs, or gallops.  Respiratory: Clear to auscultation bilaterally. GI: Soft, nontender, non-distended  MS: No edema; No deformity. Neuro:  Nonfocal  Psych: Normal affect   Labs    Chemistry Recent Labs  Lab 05/10/23 1219 05/11/23 0317  NA 140 141  K 4.4 3.8  CL 102 103  CO2 23 25  GLUCOSE 187* 128*  BUN 22 14  CREATININE 1.10 0.80  CALCIUM 10.1 9.1  GFRNONAA >60 >60  ANIONGAP 15 13     Hematology Recent Labs  Lab 05/10/23 1219  WBC 8.4  RBC 4.68  HGB 15.2  HCT 43.8  MCV 93.6  MCH  32.5  MCHC 34.7  RDW 12.9  PLT 261    Cardiac EnzymesNo results for input(s): "TROPONINI" in the last 168 hours. No results for input(s): "TROPIPOC" in the last 168 hours.   BNPNo results for input(s): "BNP", "PROBNP" in the last 168 hours.   DDimer No results for input(s): "DDIMER" in the last 168 hours.   Radiology    DG Chest 2 View  Result Date: 05/10/2023 CLINICAL DATA:  Chest pain while riding bicycle EXAM: CHEST - 2 VIEW COMPARISON:  Chest radiograph dated 05/08/2022 FINDINGS: Normal lung volumes. No focal consolidations. No pleural effusion or pneumothorax. Enlarged cardiomediastinal silhouette is likely projectional. No acute osseous abnormality. IMPRESSION: 1. No active cardiopulmonary disease. 2. Enlarged cardiomediastinal silhouette is likely projectional. Electronically Signed   By: Agustin Cree M.D.   On: 05/10/2023 13:19    Cardiac Studies   Echo pending Assessment & Plan    Acute coronary syndrome/non-STEMI  Coronary artery calcification  Elevated blood pressure    Continue heparin.  Catheterization in the a.m.  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  For now we will begin him on low-dose nitrates but he may be better off longer-term on something like amlodipine      For questions or updates, please contact [facility] Please consult [email or web address] for contact info under Cardiology/STEMI.      Signed, Sherryl Manges, MD  05/11/2023, 7:42 AM

## 2023-05-11 NOTE — Progress Notes (Signed)
Patient verbalized that he understood what was discussed with cardiologist this AM. Consent signed and placed in chart.

## 2023-05-11 NOTE — Progress Notes (Signed)
Pt had 9 beats run of VTach, asymptomatic. Dr. Jonah Blue was notified. No new orders given. Will continue to monitor pt.    05/11/23 0042  Vitals  BP (!) 140/92  MAP (mmHg) 108  BP Location Right Arm  BP Method Automatic  Patient Position (if appropriate) Lying  Pulse Rate (!) 59  ECG Heart Rate 61  Resp 14  Level of Consciousness  Level of Consciousness Alert  MEWS COLOR  MEWS Score Color Green  Oxygen Therapy  SpO2 99 %  O2 Device Room Air  Pain Assessment  Pain Scale 0-10  Pain Score 0  MEWS Score  MEWS Temp 0  MEWS Systolic 0  MEWS Pulse 0  MEWS RR 0  MEWS LOC 0  MEWS Score 0

## 2023-05-11 NOTE — Progress Notes (Signed)
ANTICOAGULATION CONSULT NOTE  Pharmacy Consult for Heparin Indication: chest pain/ACS  Allergies  Allergen Reactions   Crestor [Rosuvastatin] Other (See Comments)    MUSCLE CRAMPS   Lipitor [Atorvastatin] Other (See Comments)    Liver toxicity    Patient Measurements: Height: 5\' 10"  (177.8 cm) Weight: 76.1 kg (167 lb 12.3 oz) IBW/kg (Calculated) : 73 Heparin Dosing Weight: 74.8 kg  Vital Signs: Temp: 98 F (36.7 C) (06/23 0712) Temp Source: Oral (06/23 0712) BP: 144/88 (06/23 0712) Pulse Rate: 67 (06/23 0712)  Labs: Recent Labs    05/10/23 1219 05/10/23 1541 05/11/23 0053 05/11/23 0317 05/11/23 1103  HGB 15.2  --   --   --   --   HCT 43.8  --   --   --   --   PLT 261  --   --   --   --   HEPARINUNFRC  --   --  0.19*  --  0.29*  CREATININE 1.10  --   --  0.80  --   TROPONINIHS 13 53*  --   --   --      Estimated Creatinine Clearance: 84.9 mL/min (by C-G formula based on SCr of 0.8 mg/dL).   Medical History: Past Medical History:  Diagnosis Date   Cataract    Depression    RESOLVED   Hyperlipidemia    Spinal stenosis    WAS HAVING NECK PAIN -PT STATES NERVE ABLATION PROCEDURE 2012 AND PAIN HAS RESOLVED   Spondylitis (HCC)     Medications:  Medications Prior to Admission  Medication Sig Dispense Refill Last Dose   albuterol (VENTOLIN HFA) 108 (90 Base) MCG/ACT inhaler Inhale 2 puffs into the lungs every 4 (four) hours as needed for wheezing or shortness of breath. 18 g 3    cholecalciferol (VITAMIN D3) 10 MCG (400 UNIT) TABS tablet Take 5,000 Units by mouth daily.      co-enzyme Q-10 30 MG capsule Take 200 mg by mouth daily.      DULoxetine (CYMBALTA) 20 MG capsule TAKE 2 CAPSULES BY MOUTH DAILY 60 capsule 3    ferrous sulfate 325 (65 FE) MG EC tablet Take 65 mg by mouth daily.      fish oil-omega-3 fatty acids 1000 MG capsule Take 2 g by mouth daily.       folic acid (FOLVITE) 1 MG tablet Take 800 mcg by mouth daily.      L-ARGININE-500 PO Take 400 mg  by mouth. Daily      Multiple Vitamins-Minerals (CULTURELLE PROBIOTICS + MULTIV PO) Take by mouth daily.      Probiotic, Lactobacillus, CAPS Take by mouth.      Quercetin 250 MG TABS Take 500 mg by mouth daily.      Quercetin Dihydrate POWD Take by mouth.      rosuvastatin (CRESTOR) 20 MG tablet Take 1 tablet (20 mg total) by mouth daily. 90 tablet 3    vitamin A 16109 UNIT capsule Take 3,000 Units by mouth daily.      vitamin B-12 (CYANOCOBALAMIN) 1000 MCG tablet Take 1,000 mcg by mouth daily.      vitamin C (ASCORBIC ACID) 500 MG tablet Take 500 mg by mouth daily.      Vitamin D, Ergocalciferol, (DRISDOL) 1.25 MG (50000 UNIT) CAPS capsule TAKE 1 CAPSULE (50,000 UNITS TOTAL) BY MOUTH EVERY 7 (SEVEN) DAYS 12 capsule 0     Scheduled:   aspirin EC  81 mg Oral Daily   isosorbide mononitrate  30 mg Oral Daily   rosuvastatin  20 mg Oral Daily   Infusions:   heparin 1,200 Units/hr (05/11/23 1122)   PRN: acetaminophen, nitroGLYCERIN, ondansetron (ZOFRAN) IV  Assessment: 71 yom with a history of HLD, CAD. Patient is presenting with chest pain. Heparin per pharmacy consult placed for chest pain/ACS. Patient is not on anticoagulation prior to arrival.  Heparin level slightly below goal: 0.29 on 1200 units/hr, no issues with infusion or overt s/sx of bleeding. CBC stable WNL  Goal of Therapy:  Heparin level 0.3-0.7 units/ml Monitor platelets by anticoagulation protocol: Yes   Plan:  Increase heparin infusion to 1350 units/hr Check anti-Xa level in 6 hours and daily while on heparin Continue to monitor H&H and platelets  Andreas Ohm, PharmD Pharmacy Resident  05/11/2023 11:41 AM

## 2023-05-12 ENCOUNTER — Other Ambulatory Visit (HOSPITAL_COMMUNITY): Payer: Self-pay

## 2023-05-12 ENCOUNTER — Telehealth: Payer: Self-pay | Admitting: Cardiology

## 2023-05-12 ENCOUNTER — Encounter (HOSPITAL_COMMUNITY)
Admission: EM | Disposition: A | Discharge status: Home / Self Care | Payer: Self-pay | Source: Home / Self Care | Attending: Internal Medicine

## 2023-05-12 ENCOUNTER — Inpatient Hospital Stay
Admission: RE | Admit: 2023-05-12 | Discharge: 2023-05-12 | Disposition: A | Discharge status: Home / Self Care | Payer: Medicare Other | Source: Ambulatory Visit | Attending: Family Medicine | Admitting: Family Medicine

## 2023-05-12 DIAGNOSIS — I25110 Atherosclerotic heart disease of native coronary artery with unstable angina pectoris: Secondary | ICD-10-CM | POA: Diagnosis not present

## 2023-05-12 DIAGNOSIS — I214 Non-ST elevation (NSTEMI) myocardial infarction: Secondary | ICD-10-CM | POA: Diagnosis not present

## 2023-05-12 HISTORY — PX: LEFT HEART CATH AND CORONARY ANGIOGRAPHY: CATH118249

## 2023-05-12 HISTORY — PX: CORONARY STENT INTERVENTION: CATH118234

## 2023-05-12 HISTORY — PX: CORONARY PRESSURE/FFR STUDY: CATH118243

## 2023-05-12 LAB — CBC
HCT: 40.1 % (ref 39.0–52.0)
Hemoglobin: 13.6 g/dL (ref 13.0–17.0)
MCH: 32.5 pg (ref 26.0–34.0)
MCHC: 33.9 g/dL (ref 30.0–36.0)
MCV: 95.7 fL (ref 80.0–100.0)
Platelets: 211 10*3/uL (ref 150–400)
RBC: 4.19 MIL/uL — ABNORMAL LOW (ref 4.22–5.81)
RDW: 13.2 % (ref 11.5–15.5)
WBC: 9.4 10*3/uL (ref 4.0–10.5)
nRBC: 0 % (ref 0.0–0.2)

## 2023-05-12 LAB — POCT ACTIVATED CLOTTING TIME
Activated Clotting Time: 250 seconds
Activated Clotting Time: 311 seconds
Activated Clotting Time: 238 seconds
Activated Clotting Time: 281 seconds

## 2023-05-12 LAB — HEPARIN LEVEL (UNFRACTIONATED): Heparin Unfractionated: 0.41 IU/mL (ref 0.30–0.70)

## 2023-05-12 SURGERY — LEFT HEART CATH AND CORONARY ANGIOGRAPHY
Anesthesia: LOCAL

## 2023-05-12 MED ORDER — FENTANYL CITRATE (PF) 100 MCG/2ML IJ SOLN
INTRAMUSCULAR | Status: AC
  Filled 2023-05-12: qty 2

## 2023-05-12 MED ORDER — TICAGRELOR 90 MG PO TABS
90.0000 mg | ORAL_TABLET | Freq: Two times a day (BID) | ORAL | 2 refills | Status: DC
  Filled 2023-05-12: qty 60, 30d supply, fill #0

## 2023-05-12 MED ORDER — MORPHINE SULFATE (PF) 2 MG/ML IV SOLN: 2.0000 mg | INTRAVENOUS | Status: DC | PRN

## 2023-05-12 MED ORDER — LABETALOL HCL 5 MG/ML IV SOLN
INTRAVENOUS | Status: AC
  Filled 2023-05-12: qty 4

## 2023-05-12 MED ORDER — HEPARIN SODIUM (PORCINE) 1000 UNIT/ML IJ SOLN
INTRAMUSCULAR | Status: DC | PRN
  Administered 2023-05-12: 4000 [IU] via INTRAVENOUS
  Administered 2023-05-12: 2000 [IU] via INTRAVENOUS
  Administered 2023-05-12: 4000 [IU] via INTRAVENOUS
  Administered 2023-05-12: 2000 [IU] via INTRAVENOUS

## 2023-05-12 MED ORDER — SODIUM CHLORIDE 0.9 % IV SOLN: INTRAVENOUS | Status: AC

## 2023-05-12 MED ORDER — HYDRALAZINE HCL 20 MG/ML IJ SOLN: 10.0000 mg | INTRAMUSCULAR | Status: DC | PRN

## 2023-05-12 MED ORDER — ADENOSINE 12 MG/4ML IV SOLN
INTRAVENOUS | Status: AC
  Filled 2023-05-12: qty 4

## 2023-05-12 MED ORDER — FENTANYL CITRATE (PF) 100 MCG/2ML IJ SOLN
INTRAMUSCULAR | Status: DC | PRN
  Administered 2023-05-12 (×2): 25 ug via INTRAVENOUS

## 2023-05-12 MED ORDER — LIDOCAINE HCL (PF) 1 % IJ SOLN
INTRAMUSCULAR | Status: DC | PRN
  Administered 2023-05-12: 2 mL

## 2023-05-12 MED ORDER — SODIUM CHLORIDE 0.9 % IV SOLN: 250.0000 mL | INTRAVENOUS | Status: DC | PRN

## 2023-05-12 MED ORDER — TICAGRELOR 90 MG PO TABS: 90.0000 mg | ORAL_TABLET | Freq: Two times a day (BID) | ORAL | Status: DC

## 2023-05-12 MED ORDER — MIDAZOLAM HCL 2 MG/2ML IJ SOLN
INTRAMUSCULAR | Status: AC
  Filled 2023-05-12: qty 2

## 2023-05-12 MED ORDER — HEPARIN (PORCINE) IN NACL 1000-0.9 UT/500ML-% IV SOLN
INTRAVENOUS | Status: DC | PRN
  Administered 2023-05-12 (×2): 500 mL

## 2023-05-12 MED ORDER — OXYCODONE-ACETAMINOPHEN 5-325 MG PO TABS
1.0000 | ORAL_TABLET | Freq: Once | ORAL | Status: AC
  Administered 2023-05-12: 1 via ORAL
  Filled 2023-05-12: qty 1

## 2023-05-12 MED ORDER — ADENOSINE (DIAGNOSTIC) 140MCG/KG/MIN
INTRAVENOUS | Status: DC | PRN
  Administered 2023-05-12: 140 ug/kg/min via INTRAVENOUS

## 2023-05-12 MED ORDER — MIDAZOLAM HCL 2 MG/2ML IJ SOLN
INTRAMUSCULAR | Status: DC | PRN
  Administered 2023-05-12 (×2): 1 mg via INTRAVENOUS

## 2023-05-12 MED ORDER — TICAGRELOR 90 MG PO TABS
ORAL_TABLET | ORAL | Status: DC | PRN
  Administered 2023-05-12: 180 mg via ORAL

## 2023-05-12 MED ORDER — NITROGLYCERIN 1 MG/10 ML FOR IR/CATH LAB
INTRA_ARTERIAL | Status: AC
  Filled 2023-05-12: qty 10

## 2023-05-12 MED ORDER — HEPARIN SODIUM (PORCINE) 1000 UNIT/ML IJ SOLN
INTRAMUSCULAR | Status: AC
  Filled 2023-05-12: qty 10

## 2023-05-12 MED ORDER — SODIUM CHLORIDE 0.9% FLUSH: 3.0000 mL | Freq: Two times a day (BID) | INTRAVENOUS | Status: DC

## 2023-05-12 MED ORDER — SODIUM CHLORIDE 0.9% FLUSH: 3.0000 mL | INTRAVENOUS | Status: DC | PRN

## 2023-05-12 MED ORDER — ADENOSINE 12 MG/4ML IV SOLN
INTRAVENOUS | Status: AC
  Filled 2023-05-12: qty 12

## 2023-05-12 MED ORDER — LABETALOL HCL 5 MG/ML IV SOLN: 10.0000 mg | INTRAVENOUS | Status: DC | PRN

## 2023-05-12 MED ORDER — LIDOCAINE HCL (PF) 1 % IJ SOLN
INTRAMUSCULAR | Status: AC
  Filled 2023-05-12: qty 30

## 2023-05-12 MED ORDER — NITROGLYCERIN 1 MG/10 ML FOR IR/CATH LAB
INTRA_ARTERIAL | Status: DC | PRN
  Administered 2023-05-12: 200 ug via INTRACORONARY

## 2023-05-12 MED ORDER — VERAPAMIL HCL 2.5 MG/ML IV SOLN
INTRAVENOUS | Status: AC
  Filled 2023-05-12: qty 2

## 2023-05-12 MED ORDER — LABETALOL HCL 5 MG/ML IV SOLN
INTRAVENOUS | Status: DC | PRN
  Administered 2023-05-12: 10 mg via INTRAVENOUS

## 2023-05-12 MED ORDER — IOHEXOL 350 MG/ML SOLN
INTRAVENOUS | Status: DC | PRN
  Administered 2023-05-12: 200 mL

## 2023-05-12 MED ORDER — NITROGLYCERIN 0.4 MG SL SUBL
0.4000 mg | SUBLINGUAL_TABLET | SUBLINGUAL | 2 refills | Status: DC | PRN
  Filled 2023-05-12: qty 25, 30d supply, fill #0

## 2023-05-12 MED ORDER — ASPIRIN 81 MG PO TBEC
81.0000 mg | DELAYED_RELEASE_TABLET | Freq: Every day | ORAL | 2 refills | Status: DC
  Filled 2023-05-12: qty 120, 120d supply, fill #0

## 2023-05-12 SURGICAL SUPPLY — 23 items
BALL SAPPHIRE NC24 3.5X8 (BALLOONS) ×1
BALLN EMERGE MR 2.5X12 (BALLOONS) ×1
BALLOON EMERGE MR 2.5X12 (BALLOONS) IMPLANT
BALLOON SAPPHIRE NC24 3.5X8 (BALLOONS) IMPLANT
CATH INFINITI AMBI 5FR TG (CATHETERS) IMPLANT
CATH VISTA GUIDE 6FR XB3.5 (CATHETERS) IMPLANT
DEVICE RAD COMP TR BAND LRG (VASCULAR PRODUCTS) IMPLANT
GLIDESHEATH SLEND SS 6F .021 (SHEATH) IMPLANT
GUIDEWIRE INQWIRE 1.5J.035X260 (WIRE) IMPLANT
GUIDEWIRE PRESSURE X 175 (WIRE) IMPLANT
INQWIRE 1.5J .035X260CM (WIRE) ×1
KIT ESSENTIALS PG (KITS) IMPLANT
KIT HEART LEFT (KITS) ×1 IMPLANT
PACK CARDIAC CATHETERIZATION (CUSTOM PROCEDURE TRAY) ×1 IMPLANT
SHEATH PROBE COVER 6X72 (BAG) IMPLANT
STENT SYNERGY XD 2.50X16 (Permanent Stent) IMPLANT
STENT SYNERGY XD 3.0X12 (Permanent Stent) IMPLANT
SYNERGY XD 2.50X16 (Permanent Stent) ×1 IMPLANT
SYNERGY XD 3.0X12 (Permanent Stent) ×1 IMPLANT
SYR MEDRAD MARK 7 150ML (SYRINGE) ×1 IMPLANT
TRANSDUCER W/STOPCOCK (MISCELLANEOUS) ×1 IMPLANT
TUBING CIL FLEX 10 FLL-RA (TUBING) ×1 IMPLANT
WIRE ASAHI PROWATER 180CM (WIRE) IMPLANT

## 2023-05-12 NOTE — Interval H&P Note (Signed)
History and Physical Interval Note:  05/12/2023 12:13 PM  Paul [name]  has presented today for surgery, with the diagnosis of unstable angina.  The various methods of treatment have been discussed with the patient and family. After consideration of risks, benefits and other options for treatment, the patient has consented to  Procedure(s): LEFT HEART CATH AND CORONARY ANGIOGRAPHY (N/A)  PERCUTANEOUS CORONARY INTERVENTION  as a surgical intervention.  The patient's history has been reviewed, patient examined, no change in status, stable for surgery.  I have reviewed the patient's chart and labs.  Questions were answered to the patient's satisfaction.    Cath Lab Visit (complete for each Cath Lab visit)  Clinical Evaluation Leading to the Procedure:   ACS: Yes.    Non-ACS:    Anginal Classification: CCS IV  Anti-ischemic medical therapy: Minimal Therapy (1 class of medications)  Non-Invasive Test Results: No non-invasive testing performed  Prior CABG: No previous CABG   Bryan Lemma

## 2023-05-12 NOTE — H&P (View-Only) (Signed)
Rounding Note    Patient Name: Paul [name] Date of Encounter: 05/12/2023  [facility] Cardiologist: Donato Schultz, MD   Subjective   Headache, otherwise no complaints, awaiting cath  Inpatient Medications    Scheduled Meds:  aspirin EC  81 mg Oral Daily   isosorbide mononitrate  30 mg Oral Daily   rosuvastatin  20 mg Oral Daily   sodium chloride flush  3 mL Intravenous Q12H   Continuous Infusions:  sodium chloride     sodium chloride 1 mL/kg/hr (05/12/23 0508)   heparin 1,350 Units/hr (05/12/23 0508)   PRN Meds: sodium chloride, acetaminophen, nitroGLYCERIN, ondansetron (ZOFRAN) IV, sodium chloride flush   Vital Signs    Vitals:   05/11/23 1628 05/11/23 2037 05/12/23 0322 05/12/23 0800  BP: 126/83 133/88 (!) 143/85 (!) 144/85  Pulse:  62  (!) 53  Resp: 18 20 15 13   Temp: 98.1 F (36.7 C) 98 F (36.7 C) 98.5 F (36.9 C) 97.8 F (36.6 C)  TempSrc: Oral Oral Oral Oral  SpO2:    100%  Weight:      Height:        Intake/Output Summary (Last 24 hours) at 05/12/2023 0833 Last data filed at 05/12/2023 0801 Gross per 24 hour  Intake 830.75 ml  Output 1300 ml  Net -469.25 ml      05/10/2023    7:00 PM 05/10/2023    6:59 PM 05/10/2023   12:22 PM  Last 3 Weights  Weight (lbs) 167 lb 12.3 oz 167 lb 12.3 oz 165 lb  Weight (kg) 76.1 kg 76.1 kg 74.844 kg      Telemetry    NSR 60-70s  - Personally Reviewed  ECG    SR HR 73, new iRRB - Personally Reviewed  Physical Exam   GEN: No acute distress.   Cardiac: RRR, no rubs or gallops. 3/6 Systolic murmur.  Respiratory: Clear to auscultation bilaterally. MS: No edema; No deformity. Neuro:  Nonfocal  Psych: Normal affect   Labs    High Sensitivity Troponin:   Recent Labs  Lab 05/10/23 1219 05/10/23 1541  TROPONINIHS 13 53*     Chemistry Recent Labs  Lab 05/10/23 1219 05/11/23 0317  NA 140 141  K 4.4 3.8  CL 102 103  CO2 23 25  GLUCOSE 187* 128*  BUN 22 14  CREATININE 1.10 0.80   CALCIUM 10.1 9.1  GFRNONAA >60 >60  ANIONGAP 15 13     Hematology Recent Labs  Lab 05/10/23 1219 05/11/23 1806 05/12/23 0240  WBC 8.4 9.4 9.4  RBC 4.68 4.39 4.19*  HGB 15.2 14.3 13.6  HCT 43.8 42.3 40.1  MCV 93.6 96.4 95.7  MCH 32.5 32.6 32.5  MCHC 34.7 33.8 33.9  RDW 12.9 13.0 13.2  PLT 261 217 211   Thyroid  Recent Labs  Lab 05/11/23 0317  TSH 0.877  FREET4 0.83     Radiology    ECHOCARDIOGRAM COMPLETE  Result Date: 05/11/2023    ECHOCARDIOGRAM REPORT   Patient Name:   Paul [name] Date of Exam: 05/11/2023 Medical Rec #:  [id number]        Height:       70.0 in Accession #:    [id number]       Weight:       167.8 lb Date of Birth:  02-15-1951       BSA:          1.937 m Patient Age:    72 years  BP:           120/79 mmHg Patient Gender: M                HR:           67 bpm. Exam Location:  Inpatient Procedure: 2D Echo, Cardiac Doppler and Color Doppler Indications:    NSTEMI I21.4  History:        Patient has no prior history of Echocardiogram examinations.                 Risk Factors:Non-Smoker and Dyslipidemia.  Sonographer:    Dondra Prader RVT RCS Referring Phys: [phone] EZEQUIEL D MUNOZ GONZALEZ IMPRESSIONS  1. Left ventricular ejection fraction, by estimation, is 50 to 55%. Left ventricular ejection fraction by 2D MOD biplane is 53.4 %. The left ventricle has low normal function. The left ventricle has no regional wall motion abnormalities. There is moderate left ventricular hypertrophy. Left ventricular diastolic parameters are consistent with Grade I diastolic dysfunction (impaired relaxation).  2. Right ventricular systolic function is mildly reduced. The right ventricular size is normal. Tricuspid regurgitation signal is inadequate for assessing PA pressure.  3. Right atrial size was mildly dilated.  4. The mitral valve is abnormal. Trivial mitral valve regurgitation.  5. The aortic valve is tricuspid. Aortic valve regurgitation is trivial. Aortic valve  sclerosis/calcification is present, without any evidence of aortic stenosis. Aortic valve mean gradient measures 8.0 mmHg.  6. Aortic dilatation noted. There is borderline dilatation of the aortic root, measuring 39 mm.  7. The inferior vena cava is normal in size with greater than 50% respiratory variability, suggesting right atrial pressure of 3 mmHg. Comparison(s): No prior Echocardiogram. FINDINGS  Left Ventricle: Left ventricular ejection fraction, by estimation, is 50 to 55%. Left ventricular ejection fraction by 2D MOD biplane is 53.4 %. The left ventricle has low normal function. The left ventricle has no regional wall motion abnormalities. The left ventricular internal cavity size was normal in size. There is moderate left ventricular hypertrophy. Left ventricular diastolic parameters are consistent with Grade I diastolic dysfunction (impaired relaxation). Normal left ventricular filling pressure. Right Ventricle: The right ventricular size is normal. No increase in right ventricular wall thickness. Right ventricular systolic function is mildly reduced. Tricuspid regurgitation signal is inadequate for assessing PA pressure. Left Atrium: Left atrial size was normal in size. Right Atrium: Right atrial size was mildly dilated. Prominent Eustachian valve. Pericardium: There is no evidence of pericardial effusion. Mitral Valve: The mitral valve is abnormal. There is mild calcification of the anterior mitral valve leaflet(s). Trivial mitral valve regurgitation. Tricuspid Valve: The tricuspid valve is grossly normal. Tricuspid valve regurgitation is trivial. Aortic Valve: The aortic valve is tricuspid. Aortic valve regurgitation is trivial. Aortic valve sclerosis/calcification is present, without any evidence of aortic stenosis. Aortic valve mean gradient measures 8.0 mmHg. Aortic valve peak gradient measures 14.9 mmHg. Aortic valve area, by VTI measures 1.92 cm. Pulmonic Valve: The pulmonic valve was grossly  normal. Pulmonic valve regurgitation is trivial. Aorta: Aortic dilatation noted. There is borderline dilatation of the aortic root, measuring 39 mm. Venous: The inferior vena cava is normal in size with greater than 50% respiratory variability, suggesting right atrial pressure of 3 mmHg. IAS/Shunts: The interatrial septum is aneurysmal. No atrial level shunt detected by color flow Doppler.  LEFT VENTRICLE PLAX 2D                        Biplane EF (  MOD) LVIDd:         4.40 cm         LV Biplane EF:   Left LVIDs:         2.80 cm                          ventricular LV PW:         1.30 cm                          ejection LV IVS:        1.30 cm                          fraction by LVOT diam:     2.20 cm                          2D MOD LV SV:         73                               biplane is LV SV Index:   38                               53.4 %. LVOT Area:     3.80 cm                                Diastology                                LV e' medial:    7.51 cm/s LV Volumes (MOD)               LV E/e' medial:  5.5 LV vol d, MOD    122.6 ml      LV e' lateral:   11.20 cm/s A2C:                           LV E/e' lateral: 3.7 LV vol d, MOD    127.2 ml A4C: LV vol s, MOD    53.8 ml A2C: LV vol s, MOD    64.9 ml A4C: LV SV MOD A2C:   68.7 ml LV SV MOD A4C:   127.2 ml LV SV MOD BP:    69.2 ml RIGHT VENTRICLE            IVC RV Basal diam:  4.20 cm    IVC diam: 1.80 cm RV Mid diam:    3.50 cm RV S prime:     9.75 cm/s TAPSE (M-mode): 2.5 cm LEFT ATRIUM             Index        RIGHT ATRIUM           Index LA diam:        3.70 cm 1.91 cm/m   RA Area:     21.80 cm LA Vol (A2C):   42.8 ml 22.09 ml/m  RA Volume:   64.20 ml  33.14 ml/m LA Vol (A4C):   37.0 ml 19.10 ml/m LA Biplane Vol:  42.2 ml 21.78 ml/m  AORTIC VALVE                     PULMONIC VALVE AV Area (Vmax):    2.07 cm      PV Vmax:          1.09 m/s AV Area (Vmean):   1.99 cm      PV Peak grad:     4.7 mmHg AV Area (VTI):     1.92 cm      PR End Diast Vel:  2.05 msec AV Vmax:           193.00 cm/s AV Vmean:          128.500 cm/s AV VTI:            0.382 m AV Peak Grad:      14.9 mmHg AV Mean Grad:      8.0 mmHg LVOT Vmax:         105.00 cm/s LVOT Vmean:        67.300 cm/s LVOT VTI:          0.193 m LVOT/AV VTI ratio: 0.51 AR Vena Contracta: 0.60 cm  AORTA Ao Root diam: 3.90 cm Ao Asc diam:  3.20 cm MITRAL VALVE MV Area (PHT): 2.42 cm    SHUNTS MV Decel Time: 313 msec    Systemic VTI:  0.19 m MV E velocity: 41.40 cm/s  Systemic Diam: 2.20 cm MV A velocity: 47.60 cm/s MV E/A ratio:  0.87 Zoila Shutter MD Electronically signed by Zoila Shutter MD Signature Date/Time: 05/11/2023/3:37:34 PM    Final    DG Chest 2 View  Result Date: 05/10/2023 CLINICAL DATA:  Chest pain while riding bicycle EXAM: CHEST - 2 VIEW COMPARISON:  Chest radiograph dated 05/08/2022 FINDINGS: Normal lung volumes. No focal consolidations. No pleural effusion or pneumothorax. Enlarged cardiomediastinal silhouette is likely projectional. No acute osseous abnormality. IMPRESSION: 1. No active cardiopulmonary disease. 2. Enlarged cardiomediastinal silhouette is likely projectional. Electronically Signed   By: Agustin Cree M.D.   On: 05/10/2023 13:19    Cardiac Studies   Echo 05/11/23 IMPRESSIONS    1. Left ventricular ejection fraction, by estimation, is 50 to 55%. Left  ventricular ejection fraction by 2D MOD biplane is 53.4 %. The left  ventricle has low normal function. The left ventricle has no regional wall  motion abnormalities. There is  moderate left ventricular hypertrophy. Left ventricular diastolic  parameters are consistent with Grade I diastolic dysfunction (impaired  relaxation).   2. Right ventricular systolic function is mildly reduced. The right  ventricular size is normal. Tricuspid regurgitation signal is inadequate  for assessing PA pressure.   3. Right atrial size was mildly dilated.   4. The mitral valve is abnormal. Trivial mitral valve regurgitation.   5. The  aortic valve is tricuspid. Aortic valve regurgitation is trivial.  Aortic valve sclerosis/calcification is present, without any evidence of  aortic stenosis. Aortic valve mean gradient measures 8.0 mmHg.   6. Aortic dilatation noted. There is borderline dilatation of the aortic  root, measuring 39 mm.   7. The inferior vena cava is normal in size with greater than 50%  respiratory variability, suggesting right atrial pressure of 3 mmHg.   Patient Profile     72 y.o. male with HLD and FHx of CAD (father died with MI at the age of 4) who is being seen 05/10/2023 for the evaluation of chest pain.   Assessment & Plan  Chest pain NSTEMI -- came in with multiple episodes of chest pain while cycling that resolved with rest.  -- echo showed LVEF 50/55% with no regional wall abnormalities -- Continue crestor 20mg  daily, aspirin 81mg  daily. Baseline HR 40s, will limit beta blocker therapy -- stop imdur due to headache and lack of chest pain -- LHC today   HLD -- has had myalgias in the past, was off statin for the last 6mos, restarted 3 weeks ago -- continue crestor 20mg  daily  -- consider lipid clinic if myalgias persist  HTN  -- consider adding amlodipine if BP continues to be elevated   For questions or updates, please contact [facility] Please consult [email or web address] for contact info under   Signed, Osborne Oman, RN Student Nurse Practitioner  05/12/2023, 8:33 AM     Patient seen, examined. Available data reviewed. Agree with findings, assessment, and plan as outlined by Deniece Ree, RN, NP student.  The patient is independently interviewed and examined.  He has no chest pain free.  He had significant upper chest discomfort associated with physical exertion, prompting him to seek evaluation.  On exam, he is alert, oriented, no distress.  Lungs are clear, JVP is normal, carotid upstrokes normal without bruits, heart is regular rate and rhythm with 2/6 ejection murmur at the  right upper sternal border, abdomen.  Echo shows normal wall motion with preserved LVEF of 50 to 55%.  There is no significant valvular disease.  The patient had classic new onset angina with physical exertion and a mildly elevated high-sensitivity troponin of 53.  He has ACS-non-STEMI and cardiac catheterization possible PCI is indicated.  I discussed this at length with the patient and his wife who is present at the bedside.  He will remain on IV heparin. I have reviewed the risks, indications, and alternatives to cardiac catheterization, possible angioplasty, and stenting with the patient. Risks include but are not limited to bleeding, infection, vascular injury, stroke, myocardial infection, arrhythmia, kidney injury, radiation-related injury in the case of prolonged fluoroscopy use, emergency cardiac surgery, and death. The patient understands the risks of serious complication is 1-2 in 1000 with diagnostic cardiac cath and 1-2% or less with angioplasty/stenting.    Tonny Bollman, M.D. 05/12/2023 11:22 AM

## 2023-05-12 NOTE — TOC CM/SW Note (Signed)
Transition of Care [facility]) - Inpatient Brief Assessment   Patient Details  Name: DWIGHT ADAMCZAK MRN: [id number] Date of Birth: 1951-07-12  Transition of Care [facility]) CM/SW Contact:    Gala Lewandowsky, RN Phone Number: 05/12/2023, 12:01 PM   Clinical Narrative: Patient presented for chest pain-plan for LHC today. PTA patient was independent from home with spouse. No home needs identified at this time. Case Manager will continue to follow for additional needs.   Transition of Care Asessment: Insurance and Status: Insurance coverage has been reviewed Patient has primary care physician: Yes Home environment has been reviewed: reviewed Prior level of function:: independent Prior/Current Home Services: No current home services Social Determinants of Health Reivew: SDOH reviewed no interventions necessary Readmission risk has been reviewed: Yes Transition of care needs: no transition of care needs at this time

## 2023-05-12 NOTE — Progress Notes (Signed)
Per report, pt is being discharge and will not return to inpatient.

## 2023-05-12 NOTE — Discharge Summary (Addendum)
Discharge Summary    Patient ID: Paul [name] MRN: [id number]; DOB: 01-24-1951  Admit date: 05/10/2023 Discharge date: 05/12/2023  PCP:  Paul Retort, MD   [facility] Providers Cardiologist:  Paul Schultz, MD    Discharge Diagnoses    Principal Problem:   NSTEMI (non-ST elevated myocardial infarction) [facility]) Active Problems:   Mixed hyperlipidemia  Diagnostic Studies/Procedures    Echo: 05/11/2023  IMPRESSIONS     1. Left ventricular ejection fraction, by estimation, is 50 to 55%. Left  ventricular ejection fraction by 2D MOD biplane is 53.4 %. The left  ventricle has low normal function. The left ventricle has no regional wall  motion abnormalities. There is  moderate left ventricular hypertrophy. Left ventricular diastolic  parameters are consistent with Grade I diastolic dysfunction (impaired  relaxation).   2. Right ventricular systolic function is mildly reduced. The right  ventricular size is normal. Tricuspid regurgitation signal is inadequate  for assessing PA pressure.   3. Right atrial size was mildly dilated.   4. The mitral valve is abnormal. Trivial mitral valve regurgitation.   5. The aortic valve is tricuspid. Aortic valve regurgitation is trivial.  Aortic valve sclerosis/calcification is present, without any evidence of  aortic stenosis. Aortic valve mean gradient measures 8.0 mmHg.   6. Aortic dilatation noted. There is borderline dilatation of the aortic  root, measuring 39 mm.   7. The inferior vena cava is normal in size with greater than 50%  respiratory variability, suggesting right atrial pressure of 3 mmHg.   Comparison(s): No prior Echocardiogram.   FINDINGS   Left Ventricle: Left ventricular ejection fraction, by estimation, is 50  to 55%. Left ventricular ejection fraction by 2D MOD biplane is 53.4 %.  The left ventricle has low normal function. The left ventricle has no  regional wall motion abnormalities.  The left  ventricular internal cavity size was normal in size. There is  moderate left ventricular hypertrophy. Left ventricular diastolic  parameters are consistent with Grade I diastolic dysfunction (impaired  relaxation). Normal left ventricular filling  pressure.   Right Ventricle: The right ventricular size is normal. No increase in  right ventricular wall thickness. Right ventricular systolic function is  mildly reduced. Tricuspid regurgitation signal is inadequate for assessing  PA pressure.   Left Atrium: Left atrial size was normal in size.   Right Atrium: Right atrial size was mildly dilated. Prominent Eustachian  valve.   Pericardium: There is no evidence of pericardial effusion.   Mitral Valve: The mitral valve is abnormal. There is mild calcification of  the anterior mitral valve leaflet(s). Trivial mitral valve regurgitation.   Tricuspid Valve: The tricuspid valve is grossly normal. Tricuspid valve  regurgitation is trivial.   Aortic Valve: The aortic valve is tricuspid. Aortic valve regurgitation is  trivial. Aortic valve sclerosis/calcification is present, without any  evidence of aortic stenosis. Aortic valve mean gradient measures 8.0 mmHg.  Aortic valve peak gradient  measures 14.9 mmHg. Aortic valve area, by VTI measures 1.92 cm.   Pulmonic Valve: The pulmonic valve was grossly normal. Pulmonic valve  regurgitation is trivial.   Aorta: Aortic dilatation noted. There is borderline dilatation of the  aortic root, measuring 39 mm.   Venous: The inferior vena cava is normal in size with greater than 50%  respiratory variability, suggesting right atrial pressure of 3 mmHg.   IAS/Shunts: The interatrial septum is aneurysmal. No atrial level shunt  detected by color flow Doppler.   Cath:  05/12/2023    Lesion #1 prox LAD lesion is 70% stenosed with 60% stenosed side branch in 1st Diag. 1st Sept lesion is 60% stenosed.   A drug-eluting stent was successfully placed using  a SYNERGY XD 3.0X16 -postdilated to 3.6-3.3 mm.  Post intervention, there is a 0% residual stenosis.  TIMI-3 flow pre and post; post sidebranches were stable at 60% stenosis.   Lesion #2 Dist LAD lesion is 80% stenosed.   A drug-eluting stent was successfully placed using a SYNERGY XD 2.50X16 deployed to 2.7 mm.. Post intervention, there is a 0% residual stenosis.  TIMI-3 flow pre and post   -------------------------   Ramus lesion is 60% stenosed.   Mid Cx lesion is 40% stenosed with 50% stenosed side branch in 1st Mrg.   Prox RCA to Dist RCA lesion is 100% stenosed. -RPDA fills via left to right collaterals   -------------------------   The left ventricular systolic function is normal by Echo.  LV end diastolic pressure is normal.   There is no aortic valve stenosis.   -------------------------   Stable low risk ACS patient.  In the absence of any other complications or medical issues, we expect the patient to be ready for discharge from an interventional cardiology perspective on 05/12/2023.   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 TIMI 3 flow pre & post difficult distal LAD DES PCI reducing 80% to 0% (Synergy XD 2.5 x 16 - deployed to 2.7 mm). TIMI 3 flow pre & post with brisk L-RPDA collaterals (difficult due to distal lesion in tortous vesel - required buddy wire & multiple balloon inflations.  Normal LVEDP & normal EF by ECho    RECOMMENDATIONS Aggressive titrate medical therapy for existing CAD including the RCA CTO DAPT for at least 1 year Chest pain stable for same-day discharge today    Paul Lemma, MD  Diagnostic Dominance:  Co-dominant  Intervention   _____________   History of Present Illness     Paul [name] is a 72 y.o. male with HLD and FHx of CAD (father died with MI at the age of 35) who is being seen 05/10/2023 for the evaluation of chest pain.  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 Anne Fu in 2020 due to asymptomatic LAD and Lcx calcifications noted on a CT chest. He underwent a nuclear stress test afterwards which was noted to be negative for ischemia or infarction and has been doing well since then.  He is a very active individual that bikes >100 miles per week. The days of admission while he was biking he started noticing sudden onset of pain across the chest, pressure like sensation, associated with nausea, which prompted him to stop. This had never happened to him before. The pain lasted for approx 5 min and went away. He started biking again and noticed sudden onset of symptoms after 3 miles. He decided to stop and called his wife. Pain resolved within 5 minutes again and since he was close to home he tried to keep going but with minimal exertion he developed symptoms  again and decided to come in. On his way to the ER he reported being significantly short of breath, which again, was something unusual.   On arrival, resting HR between 50 and 60 bpm, BP 150/70, RR 15. Labs relevant for glucose 187, Cr 1.1, Hb 15, troponin 13 -> 53. CXR with enlarged cardiomediastinal silhouette. He was given 324 of ASA in the ED.   He was admitted with plans for cardiac cath  Hospital Course     NSTEMI -- High-sensitivity troponin 13>> 53.  With concerning symptoms patient underwent cardiac catheterization noted above with two-vessel CAD of LAD and RCA.  Proximal RCA CTO with left-to-right collaterals, proximal/mid LAD treated with FFR guided PCI/DES x 2.  Echocardiogram showed LVEF of 50 to 55%, moderate LVH, grade 1 diastolic dysfunction, mildly reduced RV function, trivial MR.  Recommendations for DAPT with aspirin/Brilinta for at least 1 year. Seen by cardiac rehab -- Continue aspirin, Brilinta, Crestor 20 mg daily  HLD -- Reported he had not been compliant with his statin prior to admission, started 3 weeks ago taking again -- recently resumed Crestor 20mg  daily, reports some issues with myalgias in the past -- may need lipid clinic referral as an outpatient if unable to tolerate statin -- will need FLP/LFTs in 8 weeks   Elevated BP readings -- blood pressures slightly elevated on admission, improved prior to discharge -- will have him monitor as an outpatient   Did the patient have an acute coronary syndrome (MI, NSTEMI, STEMI, etc) this admission?:  Yes                               AHA/ACC Clinical Performance & Quality Measures: Aspirin prescribed? - Yes ADP Receptor Inhibitor (Plavix/Clopidogrel, Brilinta/Ticagrelor or Effient/Prasugrel) prescribed (includes medically managed patients)? - Yes Beta Blocker prescribed? - No - normal LVEF, stable blood pressure High Intensity Statin (Lipitor 40-80mg  or Crestor 20-40mg ) prescribed? - Yes EF assessed during THIS hospitalization? - Yes For EF <40%, was ACEI/ARB prescribed? - Not Applicable (EF >/= 40%) For EF <40%, Aldosterone Antagonist (Spironolactone or Eplerenone) prescribed? - Not Applicable (EF >/= 40%) Cardiac Rehab Phase II ordered (including medically managed patients)? - Yes   The patient will be scheduled for a TOC follow up appointment in 10-14 days.  A message has been sent to the [facility] and Scheduling Pool at the office where the patient should be seen for follow up.  _____________  Discharge Vitals Blood pressure 132/72, pulse 76, temperature 97.8 F (36.6 C), temperature source Oral, resp. rate (!) 24, height 5\' 10"  (1.778 m), weight 76.1 kg, SpO2 97 %.  Filed Weights   05/10/23 1222 05/10/23 1859 05/10/23 1900  Weight: 74.8 kg 76.1 kg 76.1 kg    Labs & Radiologic Studies     CBC Recent Labs    05/11/23 1806 05/12/23 0240  WBC 9.4 9.4  HGB 14.3 13.6  HCT 42.3 40.1  MCV 96.4 95.7  PLT 217 211   Basic Metabolic Panel Recent Labs    09/81/19 1219 05/11/23 0317  NA 140 141  K 4.4 3.8  CL 102 103  CO2 23 25  GLUCOSE 187* 128*  BUN 22 14  CREATININE 1.10 0.80  CALCIUM 10.1 9.1   Liver Function Tests No results for input(s): "AST", "ALT", "ALKPHOS", "BILITOT", "PROT", "ALBUMIN" in the last 72 hours. No results for input(s): "LIPASE", "AMYLASE" in the last 72 hours. High Sensitivity Troponin:  Recent Labs  Lab 05/10/23 1219 05/10/23 1541  TROPONINIHS 13 53*    BNP Invalid input(s): "POCBNP" D-Dimer No results for input(s): "DDIMER" in the last 72 hours. Hemoglobin A1C Recent Labs    05/11/23 0317  HGBA1C 5.1   Fasting Lipid Panel No results for input(s): "CHOL", "HDL", "LDLCALC", "TRIG", "CHOLHDL", "LDLDIRECT" in the last 72 hours. Thyroid Function Tests Recent Labs    05/11/23 0317  TSH 0.877   _____________  CARDIAC CATHETERIZATION  Result Date: 05/12/2023   Lesion #1 prox LAD lesion is 70% stenosed with 60% stenosed side branch in 1st Diag. 1st Sept lesion is 60% stenosed.   A drug-eluting stent was successfully placed using a SYNERGY XD 3.0X16 -postdilated to 3.6-3.3 mm.  Post intervention, there is a 0% residual stenosis.  TIMI-3 flow pre and post; post sidebranches were stable at 60% stenosis.   Lesion #2 Dist LAD lesion is 80% stenosed.   A drug-eluting stent was successfully placed using a SYNERGY XD 2.50X16 deployed to 2.7 mm.. Post intervention, there is a 0% residual stenosis.  TIMI-3 flow pre and post   -------------------------   Ramus lesion is 60% stenosed.   Mid Cx lesion is 40% stenosed with 50% stenosed side branch in 1st Mrg.   Prox RCA to Dist RCA lesion is 100% stenosed. -RPDA fills via left to right collaterals   -------------------------   The left ventricular systolic function is normal by Echo.  LV end diastolic  pressure is normal.   There is no aortic valve stenosis.   -------------------------   Stable low risk ACS patient.  In the absence of any other complications or medical issues, we expect the patient to be ready for discharge from an interventional cardiology perspective on 05/12/2023. 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 TIMI 3 flow pre & post difficult distal LAD DES PCI reducing 80% to 0% (Synergy XD 2.5 x 16 - deployed to 2.7 mm). TIMI 3 flow pre & post with brisk L-RPDA collaterals (difficult due to distal lesion in tortous vesel - required buddy wire & multiple balloon inflations. Normal LVEDP & normal EF by ECho RECOMMENDATIONS Aggressive titrate medical therapy for existing CAD including the RCA CTO DAPT for at least 1 year Chest pain stable for same-day discharge today Paul Lemma, MD  ECHOCARDIOGRAM COMPLETE  Result Date: 05/11/2023    ECHOCARDIOGRAM REPORT   Patient Name:   Paul [name] Date of Exam: 05/11/2023 Medical Rec #:  [id number]        Height:       70.0 in Accession #:    [id number]       Weight:       167.8 lb Date of Birth:  22-Dec-1950       BSA:          1.937 m Patient Age:    71 years         BP:           120/79 mmHg Patient Gender: M                HR:  67 bpm. Exam Location:  Inpatient Procedure: 2D Echo, Cardiac Doppler and Color Doppler Indications:    NSTEMI I21.4  History:        Patient has no prior history of Echocardiogram examinations.                 Risk Factors:Non-Smoker and Dyslipidemia.  Sonographer:    Dondra Prader RVT RCS Referring Phys: [phone] EZEQUIEL D MUNOZ GONZALEZ IMPRESSIONS  1. Left ventricular ejection  fraction, by estimation, is 50 to 55%. Left ventricular ejection fraction by 2D MOD biplane is 53.4 %. The left ventricle has low normal function. The left ventricle has no regional wall motion abnormalities. There is moderate left ventricular hypertrophy. Left ventricular diastolic parameters are consistent with Grade I diastolic dysfunction (impaired relaxation).  2. Right ventricular systolic function is mildly reduced. The right ventricular size is normal. Tricuspid regurgitation signal is inadequate for assessing PA pressure.  3. Right atrial size was mildly dilated.  4. The mitral valve is abnormal. Trivial mitral valve regurgitation.  5. The aortic valve is tricuspid. Aortic valve regurgitation is trivial. Aortic valve sclerosis/calcification is present, without any evidence of aortic stenosis. Aortic valve mean gradient measures 8.0 mmHg.  6. Aortic dilatation noted. There is borderline dilatation of the aortic root, measuring 39 mm.  7. The inferior vena cava is normal in size with greater than 50% respiratory variability, suggesting right atrial pressure of 3 mmHg. Comparison(s): No prior Echocardiogram. FINDINGS  Left Ventricle: Left ventricular ejection fraction, by estimation, is 50 to 55%. Left ventricular ejection fraction by 2D MOD biplane is 53.4 %. The left ventricle has low normal function. The left ventricle has no regional wall motion abnormalities. The left ventricular internal cavity size was normal in size. There is moderate left ventricular hypertrophy. Left ventricular diastolic parameters are consistent with Grade I diastolic dysfunction (impaired relaxation). Normal left ventricular filling pressure. Right Ventricle: The right ventricular size is normal. No increase in right ventricular wall thickness. Right ventricular systolic function is mildly reduced. Tricuspid regurgitation signal is inadequate for assessing PA pressure. Left Atrium: Left atrial size was normal in size. Right  Atrium: Right atrial size was mildly dilated. Prominent Eustachian valve. Pericardium: There is no evidence of pericardial effusion. Mitral Valve: The mitral valve is abnormal. There is mild calcification of the anterior mitral valve leaflet(s). Trivial mitral valve regurgitation. Tricuspid Valve: The tricuspid valve is grossly normal. Tricuspid valve regurgitation is trivial. Aortic Valve: The aortic valve is tricuspid. Aortic valve regurgitation is trivial. Aortic valve sclerosis/calcification is present, without any evidence of aortic stenosis. Aortic valve mean gradient measures 8.0 mmHg. Aortic valve peak gradient measures 14.9 mmHg. Aortic valve area, by VTI measures 1.92 cm. Pulmonic Valve: The pulmonic valve was grossly normal. Pulmonic valve regurgitation is trivial. Aorta: Aortic dilatation noted. There is borderline dilatation of the aortic root, measuring 39 mm. Venous: The inferior vena cava is normal in size with greater than 50% respiratory variability, suggesting right atrial pressure of 3 mmHg. IAS/Shunts: The interatrial septum is aneurysmal. No atrial level shunt detected by color flow Doppler.  LEFT VENTRICLE PLAX 2D                        Biplane EF (MOD) LVIDd:         4.40 cm         LV Biplane EF:   Left LVIDs:         2.80 cm  ventricular LV PW:         1.30 cm                          ejection LV IVS:        1.30 cm                          fraction by LVOT diam:     2.20 cm                          2D MOD LV SV:         73                               biplane is LV SV Index:   38                               53.4 %. LVOT Area:     3.80 cm                                Diastology                                LV e' medial:    7.51 cm/s LV Volumes (MOD)               LV E/e' medial:  5.5 LV vol d, MOD    122.6 ml      LV e' lateral:   11.20 cm/s A2C:                           LV E/e' lateral: 3.7 LV vol d, MOD    127.2 ml A4C: LV vol s, MOD    53.8 ml A2C: LV  vol s, MOD    64.9 ml A4C: LV SV MOD A2C:   68.7 ml LV SV MOD A4C:   127.2 ml LV SV MOD BP:    69.2 ml RIGHT VENTRICLE            IVC RV Basal diam:  4.20 cm    IVC diam: 1.80 cm RV Mid diam:    3.50 cm RV S prime:     9.75 cm/s TAPSE (M-mode): 2.5 cm LEFT ATRIUM             Index        RIGHT ATRIUM           Index LA diam:        3.70 cm 1.91 cm/m   RA Area:     21.80 cm LA Vol (A2C):   42.8 ml 22.09 ml/m  RA Volume:   64.20 ml  33.14 ml/m LA Vol (A4C):   37.0 ml 19.10 ml/m LA Biplane Vol: 42.2 ml 21.78 ml/m  AORTIC VALVE                     PULMONIC VALVE AV Area (Vmax):    2.07 cm      PV Vmax:          1.09 m/s AV Area (Vmean):   1.99  cm      PV Peak grad:     4.7 mmHg AV Area (VTI):     1.92 cm      PR End Diast Vel: 2.05 msec AV Vmax:           193.00 cm/s AV Vmean:          128.500 cm/s AV VTI:            0.382 m AV Peak Grad:      14.9 mmHg AV Mean Grad:      8.0 mmHg LVOT Vmax:         105.00 cm/s LVOT Vmean:        67.300 cm/s LVOT VTI:          0.193 m LVOT/AV VTI ratio: 0.51 AR Vena Contracta: 0.60 cm  AORTA Ao Root diam: 3.90 cm Ao Asc diam:  3.20 cm MITRAL VALVE MV Area (PHT): 2.42 cm    SHUNTS MV Decel Time: 313 msec    Systemic VTI:  0.19 m MV E velocity: 41.40 cm/s  Systemic Diam: 2.20 cm MV A velocity: 47.60 cm/s MV E/A ratio:  0.87 Zoila Shutter MD Electronically signed by Zoila Shutter MD Signature Date/Time: 05/11/2023/3:37:34 PM    Final    DG Chest 2 View  Result Date: 05/10/2023 CLINICAL DATA:  Chest pain while riding bicycle EXAM: CHEST - 2 VIEW COMPARISON:  Chest radiograph dated 05/08/2022 FINDINGS: Normal lung volumes. No focal consolidations. No pleural effusion or pneumothorax. Enlarged cardiomediastinal silhouette is likely projectional. No acute osseous abnormality. IMPRESSION: 1. No active cardiopulmonary disease. 2. Enlarged cardiomediastinal silhouette is likely projectional. Electronically Signed   By: Agustin Cree M.D.   On: 05/10/2023 13:19   Disposition   Pt is  being discharged home today in good condition.  Follow-up Plans & Appointments     Follow-up Information     Sharlene Dory, PA-C Follow up on 05/23/2023.   Specialty: Cardiology Why: at 2:45pm for your cardiology follow up appt Contact information: [name] [place] [address] [phone]                Discharge Instructions     Amb Referral to Cardiac Rehabilitation   Complete by: As directed    Diagnosis:  NSTEMI Coronary Stents     After initial evaluation and assessments completed: Virtual Based Care may be provided alone or in conjunction with Phase 2 Cardiac Rehab based on patient barriers.: Yes   Intensive Cardiac Rehabilitation (ICR) [facility] location only OR Traditional Cardiac Rehabilitation (TCR) *If criteria for ICR are not met will enroll in TCR [facility] only): Yes        Discharge Medications   Allergies as of 05/12/2023       Reactions   Crestor [rosuvastatin] Other (See Comments)   Myalgias  Pt currently taking 20mg  QD   Lipitor [atorvastatin] Other (See Comments)   Liver toxicity        Medication List     TAKE these medications    aspirin EC 81 MG tablet Take 1 tablet (81 mg total) by mouth daily. Swallow whole. Start taking on: May 13, 2023   Brilinta 90 MG Tabs tablet Generic drug: ticagrelor Take 1 tablet (90 mg total) by mouth 2 (two) times daily.   DULoxetine 20 MG capsule Commonly known as: CYMBALTA TAKE 2 CAPSULES BY MOUTH DAILY   nitroGLYCERIN 0.4 MG SL tablet Commonly known as: NITROSTAT Place 1 tablet (0.4 mg  total) under the tongue every 5 (five) minutes x 3 doses as needed for chest pain. Notes to patient: Do not use within 36 hrs of viagra as this may cause an unsafe drop in blood pressures   rosuvastatin 20 MG tablet Commonly known as: CRESTOR Take 1 tablet (20 mg total) by mouth daily.   sildenafil 100 MG tablet Commonly known as: VIAGRA Take 100 mg by mouth daily as needed for erectile  dysfunction.   Vitamin D (Ergocalciferol) 1.25 MG (50000 UNIT) Caps capsule Commonly known as: DRISDOL TAKE 1 CAPSULE (50,000 UNITS TOTAL) BY MOUTH EVERY 7 (SEVEN) DAYS What changed: when to take this         Outstanding Labs/Studies   FLP/LFTs in 8 weeks   Duration of Discharge Encounter   Greater than 30 minutes including physician time.  Signed, [name], NP 05/12/2023, 4:07 PM

## 2023-05-12 NOTE — Progress Notes (Signed)
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 Pt is scheduled for cath this AM, Wife is at bedside expressed deep concerns for Paul [name]'s headache not easing off with tylenol admin. Cardiology team paged and returned call with orders. Reassurance provided. VSS. Will continue.

## 2023-05-12 NOTE — Telephone Encounter (Signed)
   Transition of Care Follow-up Phone Call Request    Patient Name: Paul [name] Date of Birth: [birth year] Date of Encounter: 05/12/2023  Primary Care Provider:  Noberto Retort, MD Primary Cardiologist:  Donato Schultz, MD  [name] has been scheduled for a transition of care follow up appointment with a [facility] provider:  Jari Favre 7/5  Please reach out to [name] within 48 hours to confirm appointment and review transition of care protocol questionnaire.  [name], NP  05/12/2023, 2:42 PM

## 2023-05-12 NOTE — Progress Notes (Signed)
Patient to cath lab at this time

## 2023-05-12 NOTE — Discharge Instructions (Signed)

## 2023-05-12 NOTE — Progress Notes (Signed)
ANTICOAGULATION CONSULT NOTE  Pharmacy Consult for Heparin Indication: chest pain/ACS  Allergies  Allergen Reactions   Crestor [Rosuvastatin] Other (See Comments)    Myalgias  Pt currently taking 20mg  QD   Lipitor [Atorvastatin] Other (See Comments)    Liver toxicity    Patient Measurements: Height: 5\' 10"  (177.8 cm) Weight: 76.1 kg (167 lb 12.3 oz) IBW/kg (Calculated) : 73 Heparin Dosing Weight: 74.8 kg  Vital Signs: Temp: 97.8 F (36.6 C) (06/24 0800) Temp Source: Oral (06/24 0800) BP: 144/85 (06/24 0800) Pulse Rate: 53 (06/24 0800)  Labs: Recent Labs    05/10/23 1219 05/10/23 1541 05/11/23 0053 05/11/23 0317 05/11/23 1103 05/11/23 1806 05/12/23 0240  HGB 15.2  --   --   --   --  14.3 13.6  HCT 43.8  --   --   --   --  42.3 40.1  PLT 261  --   --   --   --  217 211  HEPARINUNFRC  --   --    < >  --  0.29* 0.41 0.41  CREATININE 1.10  --   --  0.80  --   --   --   TROPONINIHS 13 53*  --   --   --   --   --    < > = values in this interval not displayed.     Estimated Creatinine Clearance: 84.9 mL/min (by C-G formula based on SCr of 0.8 mg/dL).   Medical History: Past Medical History:  Diagnosis Date   Cataract    Depression    RESOLVED   Hyperlipidemia    Spinal stenosis    WAS HAVING NECK PAIN -PT STATES NERVE ABLATION PROCEDURE 2012 AND PAIN HAS RESOLVED   Spondylitis (HCC)     Medications:  Medications Prior to Admission  Medication Sig Dispense Refill Last Dose   DULoxetine (CYMBALTA) 20 MG capsule TAKE 2 CAPSULES BY MOUTH DAILY 60 capsule 3 Past Week   rosuvastatin (CRESTOR) 20 MG tablet Take 1 tablet (20 mg total) by mouth daily. 90 tablet 3 Past Week   sildenafil (VIAGRA) 100 MG tablet Take 100 mg by mouth daily as needed for erectile dysfunction.   Past Month   Vitamin D, Ergocalciferol, (DRISDOL) 1.25 MG (50000 UNIT) CAPS capsule TAKE 1 CAPSULE (50,000 UNITS TOTAL) BY MOUTH EVERY 7 (SEVEN) DAYS (Patient taking differently: Take 50,000 Units  by mouth every Wednesday.) 12 capsule 0 Past Week    Scheduled:   aspirin EC  81 mg Oral Daily   isosorbide mononitrate  30 mg Oral Daily   rosuvastatin  20 mg Oral Daily   sodium chloride flush  3 mL Intravenous Q12H   Infusions:   sodium chloride     sodium chloride 1 mL/kg/hr (05/12/23 0508)   heparin 1,350 Units/hr (05/12/23 0508)   PRN: sodium chloride, acetaminophen, nitroGLYCERIN, ondansetron (ZOFRAN) IV, sodium chloride flush  Assessment: 71 yom with a history of HLD, CAD. Patient is presenting with chest pain. Heparin per pharmacy consult placed for chest pain/ACS. Patient is not on anticoagulation prior to arrival.  Heparin level therapeutic, CBC stable, cath later.  Goal of Therapy:  Heparin level 0.3-0.7 units/ml Monitor platelets by anticoagulation protocol: Yes   Plan:  Heparin 1350 units/h F/U anticoag plans postcath  Fredonia Highland, PharmD, BCPS, [facility] Clinical Pharmacist [phone] Please check AMION for all [facility] Pharmacy numbers 05/12/2023

## 2023-05-12 NOTE — Progress Notes (Signed)
 Rounding Note    Patient Name: Paul [name] Date of Encounter: 05/12/2023  [facility] Cardiologist: Mark Skains, MD   Subjective   Headache, otherwise no complaints, awaiting cath  Inpatient Medications    Scheduled Meds:  aspirin EC  81 mg Oral Daily   isosorbide mononitrate  30 mg Oral Daily   rosuvastatin  20 mg Oral Daily   sodium chloride flush  3 mL Intravenous Q12H   Continuous Infusions:  sodium chloride     sodium chloride 1 mL/kg/hr (05/12/23 0508)   heparin 1,350 Units/hr (05/12/23 0508)   PRN Meds: sodium chloride, acetaminophen, nitroGLYCERIN, ondansetron (ZOFRAN) IV, sodium chloride flush   Vital Signs    Vitals:   05/11/23 1628 05/11/23 2037 05/12/23 0322 05/12/23 0800  BP: 126/83 133/88 (!) 143/85 (!) 144/85  Pulse:  62  (!) 53  Resp: 18 20 15 13  Temp: 98.1 F (36.7 C) 98 F (36.7 C) 98.5 F (36.9 C) 97.8 F (36.6 C)  TempSrc: Oral Oral Oral Oral  SpO2:    100%  Weight:      Height:        Intake/Output Summary (Last 24 hours) at 05/12/2023 0833 Last data filed at 05/12/2023 0801 Gross per 24 hour  Intake 830.75 ml  Output 1300 ml  Net -469.25 ml      05/10/2023    7:00 PM 05/10/2023    6:59 PM 05/10/2023   12:22 PM  Last 3 Weights  Weight (lbs) 167 lb 12.3 oz 167 lb 12.3 oz 165 lb  Weight (kg) 76.1 kg 76.1 kg 74.844 kg      Telemetry    NSR 60-70s  - Personally Reviewed  ECG    SR HR 73, new iRRB - Personally Reviewed  Physical Exam   GEN: No acute distress.   Cardiac: RRR, no rubs or gallops. 3/6 Systolic murmur.  Respiratory: Clear to auscultation bilaterally. MS: No edema; No deformity. Neuro:  Nonfocal  Psych: Normal affect   Labs    High Sensitivity Troponin:   Recent Labs  Lab 05/10/23 1219 05/10/23 1541  TROPONINIHS 13 53*     Chemistry Recent Labs  Lab 05/10/23 1219 05/11/23 0317  NA 140 141  K 4.4 3.8  CL 102 103  CO2 23 25  GLUCOSE 187* 128*  BUN 22 14  CREATININE 1.10 0.80   CALCIUM 10.1 9.1  GFRNONAA >60 >60  ANIONGAP 15 13     Hematology Recent Labs  Lab 05/10/23 1219 05/11/23 1806 05/12/23 0240  WBC 8.4 9.4 9.4  RBC 4.68 4.39 4.19*  HGB 15.2 14.3 13.6  HCT 43.8 42.3 40.1  MCV 93.6 96.4 95.7  MCH 32.5 32.6 32.5  MCHC 34.7 33.8 33.9  RDW 12.9 13.0 13.2  PLT 261 217 211   Thyroid  Recent Labs  Lab 05/11/23 0317  TSH 0.877  FREET4 0.83     Radiology    ECHOCARDIOGRAM COMPLETE  Result Date: 05/11/2023    ECHOCARDIOGRAM REPORT   Patient Name:   Paul [name] Date of Exam: 05/11/2023 Medical Rec #:  [id number]        Height:       70.0 in Accession #:    [id number]       Weight:       167.8 lb Date of Birth:  02/03/1951       BSA:          1.937 m Patient Age:    72 years           BP:           120/79 mmHg Patient Gender: M                HR:           67 bpm. Exam Location:  Inpatient Procedure: 2D Echo, Cardiac Doppler and Color Doppler Indications:    NSTEMI I21.4  History:        Patient has no prior history of Echocardiogram examinations.                 Risk Factors:Non-Smoker and Dyslipidemia.  Sonographer:    Gina Gealow RVT RCS Referring Phys: [id number] EZEQUIEL D MUNOZ GONZALEZ IMPRESSIONS  1. Left ventricular ejection fraction, by estimation, is 50 to 55%. Left ventricular ejection fraction by 2D MOD biplane is 53.4 %. The left ventricle has low normal function. The left ventricle has no regional wall motion abnormalities. There is moderate left ventricular hypertrophy. Left ventricular diastolic parameters are consistent with Grade I diastolic dysfunction (impaired relaxation).  2. Right ventricular systolic function is mildly reduced. The right ventricular size is normal. Tricuspid regurgitation signal is inadequate for assessing PA pressure.  3. Right atrial size was mildly dilated.  4. The mitral valve is abnormal. Trivial mitral valve regurgitation.  5. The aortic valve is tricuspid. Aortic valve regurgitation is trivial. Aortic valve  sclerosis/calcification is present, without any evidence of aortic stenosis. Aortic valve mean gradient measures 8.0 mmHg.  6. Aortic dilatation noted. There is borderline dilatation of the aortic root, measuring 39 mm.  7. The inferior vena cava is normal in size with greater than 50% respiratory variability, suggesting right atrial pressure of 3 mmHg. Comparison(s): No prior Echocardiogram. FINDINGS  Left Ventricle: Left ventricular ejection fraction, by estimation, is 50 to 55%. Left ventricular ejection fraction by 2D MOD biplane is 53.4 %. The left ventricle has low normal function. The left ventricle has no regional wall motion abnormalities. The left ventricular internal cavity size was normal in size. There is moderate left ventricular hypertrophy. Left ventricular diastolic parameters are consistent with Grade I diastolic dysfunction (impaired relaxation). Normal left ventricular filling pressure. Right Ventricle: The right ventricular size is normal. No increase in right ventricular wall thickness. Right ventricular systolic function is mildly reduced. Tricuspid regurgitation signal is inadequate for assessing PA pressure. Left Atrium: Left atrial size was normal in size. Right Atrium: Right atrial size was mildly dilated. Prominent Eustachian valve. Pericardium: There is no evidence of pericardial effusion. Mitral Valve: The mitral valve is abnormal. There is mild calcification of the anterior mitral valve leaflet(s). Trivial mitral valve regurgitation. Tricuspid Valve: The tricuspid valve is grossly normal. Tricuspid valve regurgitation is trivial. Aortic Valve: The aortic valve is tricuspid. Aortic valve regurgitation is trivial. Aortic valve sclerosis/calcification is present, without any evidence of aortic stenosis. Aortic valve mean gradient measures 8.0 mmHg. Aortic valve peak gradient measures 14.9 mmHg. Aortic valve area, by VTI measures 1.92 cm. Pulmonic Valve: The pulmonic valve was grossly  normal. Pulmonic valve regurgitation is trivial. Aorta: Aortic dilatation noted. There is borderline dilatation of the aortic root, measuring 39 mm. Venous: The inferior vena cava is normal in size with greater than 50% respiratory variability, suggesting right atrial pressure of 3 mmHg. IAS/Shunts: The interatrial septum is aneurysmal. No atrial level shunt detected by color flow Doppler.  LEFT VENTRICLE PLAX 2D                        Biplane EF (  MOD) LVIDd:         4.40 cm         LV Biplane EF:   Left LVIDs:         2.80 cm                          ventricular LV PW:         1.30 cm                          ejection LV IVS:        1.30 cm                          fraction by LVOT diam:     2.20 cm                          2D MOD LV SV:         73                               biplane is LV SV Index:   38                               53.4 %. LVOT Area:     3.80 cm                                Diastology                                LV e' medial:    7.51 cm/s LV Volumes (MOD)               LV E/e' medial:  5.5 LV vol d, MOD    122.6 ml      LV e' lateral:   11.20 cm/s A2C:                           LV E/e' lateral: 3.7 LV vol d, MOD    127.2 ml A4C: LV vol s, MOD    53.8 ml A2C: LV vol s, MOD    64.9 ml A4C: LV SV MOD A2C:   68.7 ml LV SV MOD A4C:   127.2 ml LV SV MOD BP:    69.2 ml RIGHT VENTRICLE            IVC RV Basal diam:  4.20 cm    IVC diam: 1.80 cm RV Mid diam:    3.50 cm RV S prime:     9.75 cm/s TAPSE (M-mode): 2.5 cm LEFT ATRIUM             Index        RIGHT ATRIUM           Index LA diam:        3.70 cm 1.91 cm/m   RA Area:     21.80 cm LA Vol (A2C):   42.8 ml 22.09 ml/m  RA Volume:   64.20 ml  33.14 ml/m LA Vol (A4C):   37.0 ml 19.10 ml/m LA Biplane Vol:   42.2 ml 21.78 ml/m  AORTIC VALVE                     PULMONIC VALVE AV Area (Vmax):    2.07 cm      PV Vmax:          1.09 m/s AV Area (Vmean):   1.99 cm      PV Peak grad:     4.7 mmHg AV Area (VTI):     1.92 cm      PR End Diast Vel:  2.05 msec AV Vmax:           193.00 cm/s AV Vmean:          128.500 cm/s AV VTI:            0.382 m AV Peak Grad:      14.9 mmHg AV Mean Grad:      8.0 mmHg LVOT Vmax:         105.00 cm/s LVOT Vmean:        67.300 cm/s LVOT VTI:          0.193 m LVOT/AV VTI ratio: 0.51 AR Vena Contracta: 0.60 cm  AORTA Ao Root diam: 3.90 cm Ao Asc diam:  3.20 cm MITRAL VALVE MV Area (PHT): 2.42 cm    SHUNTS MV Decel Time: 313 msec    Systemic VTI:  0.19 m MV E velocity: 41.40 cm/s  Systemic Diam: 2.20 cm MV A velocity: 47.60 cm/s MV E/A ratio:  0.87 Kenneth Hilty MD Electronically signed by Kenneth Hilty MD Signature Date/Time: 05/11/2023/3:37:34 PM    Final    DG Chest 2 View  Result Date: 05/10/2023 CLINICAL DATA:  Chest pain while riding bicycle EXAM: CHEST - 2 VIEW COMPARISON:  Chest radiograph dated 05/08/2022 FINDINGS: Normal lung volumes. No focal consolidations. No pleural effusion or pneumothorax. Enlarged cardiomediastinal silhouette is likely projectional. No acute osseous abnormality. IMPRESSION: 1. No active cardiopulmonary disease. 2. Enlarged cardiomediastinal silhouette is likely projectional. Electronically Signed   By: Limin  Xu M.D.   On: 05/10/2023 13:19    Cardiac Studies   Echo 05/11/23 IMPRESSIONS    1. Left ventricular ejection fraction, by estimation, is 50 to 55%. Left  ventricular ejection fraction by 2D MOD biplane is 53.4 %. The left  ventricle has low normal function. The left ventricle has no regional wall  motion abnormalities. There is  moderate left ventricular hypertrophy. Left ventricular diastolic  parameters are consistent with Grade I diastolic dysfunction (impaired  relaxation).   2. Right ventricular systolic function is mildly reduced. The right  ventricular size is normal. Tricuspid regurgitation signal is inadequate  for assessing PA pressure.   3. Right atrial size was mildly dilated.   4. The mitral valve is abnormal. Trivial mitral valve regurgitation.   5. The  aortic valve is tricuspid. Aortic valve regurgitation is trivial.  Aortic valve sclerosis/calcification is present, without any evidence of  aortic stenosis. Aortic valve mean gradient measures 8.0 mmHg.   6. Aortic dilatation noted. There is borderline dilatation of the aortic  root, measuring 39 mm.   7. The inferior vena cava is normal in size with greater than 50%  respiratory variability, suggesting right atrial pressure of 3 mmHg.   Patient Profile     71 y.o. male with HLD and FHx of CAD (father died with MI at the age of 62) who is being seen 05/10/2023 for the evaluation of chest pain.   Assessment & Plan      Chest pain NSTEMI -- came in with multiple episodes of chest pain while cycling that resolved with rest.  -- echo showed LVEF 50/55% with no regional wall abnormalities -- Continue crestor 20mg daily, aspirin 81mg daily. Baseline HR 40s, will limit beta blocker therapy -- stop imdur due to headache and lack of chest pain -- LHC today   HLD -- has had myalgias in the past, was off statin for the last 6mos, restarted 3 weeks ago -- continue crestor 20mg daily  -- consider lipid clinic if myalgias persist  HTN  -- consider adding amlodipine if BP continues to be elevated   For questions or updates, please contact [facility] Please consult [email or web address] for contact info under   Signed, Mary E Neal, RN Student Nurse Practitioner  05/12/2023, 8:33 AM     Patient seen, examined. Available data reviewed. Agree with findings, assessment, and plan as outlined by Mary Neal, RN, NP student.  The patient is independently interviewed and examined.  He has no chest pain free.  He had significant upper chest discomfort associated with physical exertion, prompting him to seek evaluation.  On exam, he is alert, oriented, no distress.  Lungs are clear, JVP is normal, carotid upstrokes normal without bruits, heart is regular rate and rhythm with 2/6 ejection murmur at the  right upper sternal border, abdomen.  Echo shows normal wall motion with preserved LVEF of 50 to 55%.  There is no significant valvular disease.  The patient had classic new onset angina with physical exertion and a mildly elevated high-sensitivity troponin of 53.  He has ACS-non-STEMI and cardiac catheterization possible PCI is indicated.  I discussed this at length with the patient and his wife who is present at the bedside.  He will remain on IV heparin. I have reviewed the risks, indications, and alternatives to cardiac catheterization, possible angioplasty, and stenting with the patient. Risks include but are not limited to bleeding, infection, vascular injury, stroke, myocardial infection, arrhythmia, kidney injury, radiation-related injury in the case of prolonged fluoroscopy use, emergency cardiac surgery, and death. The patient understands the risks of serious complication is 1-2 in 1000 with diagnostic cardiac cath and 1-2% or less with angioplasty/stenting.    Shailene Demonbreun, M.D. 05/12/2023 11:22 AM  

## 2023-05-12 NOTE — Progress Notes (Signed)
CARDIAC REHAB PHASE I     Post MI/stent education including site care, restrictions, risk factors, exercise guidelines, NTG use, heart healthy diet, antiplatelet therapy importance, MI booklet and CRP2 reviewed. All questions and concerns addressed. Will refer to [facility] for CRP2. Plan for home later today.  [id number] Woodroe Chen, RN BSN 05/12/2023 3:21 PM

## 2023-05-13 ENCOUNTER — Encounter (HOSPITAL_COMMUNITY): Payer: Self-pay | Admitting: Cardiology

## 2023-05-13 ENCOUNTER — Other Ambulatory Visit (HOSPITAL_COMMUNITY): Payer: Self-pay

## 2023-05-13 ENCOUNTER — Encounter: Payer: Self-pay | Admitting: Family Medicine

## 2023-05-13 LAB — LIPOPROTEIN A (LPA): Lipoprotein (a): 23.9 nmol/L (ref <75.0)

## 2023-05-13 NOTE — Telephone Encounter (Signed)
Patient contacted regarding discharge from [facility] on 05/11/24.  Patient understands to follow up with provider Jari Favre, NP on 05/23/2023 at 1445 at [address] [place], [place]. Patient understands discharge instructions? yes Patient understands medications and regiment? yes Patient understands to bring all medications to this visit? yes  Ask patient:  Are you enrolled in My Chart (yes)

## 2023-05-14 ENCOUNTER — Telehealth: Payer: Self-pay

## 2023-05-14 MED ORDER — TRAMADOL HCL 50 MG PO TABS: 50.0000 mg | ORAL_TABLET | Freq: Three times a day (TID) | ORAL | 0 refills | Status: AC | PRN

## 2023-05-14 NOTE — Telephone Encounter (Signed)
Sent patient MyChart message.

## 2023-05-19 ENCOUNTER — Ambulatory Visit (HOSPITAL_COMMUNITY)
Admission: RE | Admit: 2023-05-19 | Discharge: 2023-05-19 | Disposition: A | Discharge status: Home / Self Care | Payer: Medicare Other | Source: Ambulatory Visit | Attending: Cardiology | Admitting: Cardiology

## 2023-05-19 ENCOUNTER — Telehealth: Payer: Self-pay | Admitting: Physician Assistant

## 2023-05-19 ENCOUNTER — Telehealth (HOSPITAL_COMMUNITY): Payer: Self-pay

## 2023-05-19 DIAGNOSIS — Z9889 Other specified postprocedural states: Secondary | ICD-10-CM

## 2023-05-19 DIAGNOSIS — M25531 Pain in right wrist: Secondary | ICD-10-CM | POA: Diagnosis not present

## 2023-05-19 DIAGNOSIS — M25431 Effusion, right wrist: Secondary | ICD-10-CM

## 2023-05-19 NOTE — Telephone Encounter (Addendum)
Spoke with pt who reports a hard, dime sized lump at cath insertion site, right wrist.  Pt reports area is tender but denies drainage, redness or fever.   Spoke with Avie Arenas, RN who spoke with Dr Anne Fu who recommends Vas arterial US to r/o pseudo aneurysm.  If present Dr Anne Fu recommends compression.   Pt advised of Dr Anne Fu recommendation and verbalizes understanding and agreeable to current plan.

## 2023-05-19 NOTE — Telephone Encounter (Signed)
Pt states he returned to walking the day after hospital discharge and stent placement.  Pt reports chest discomfort and some SOB with walking.  Walking about a 15 -20 minute mile twice daily.  He has not taken any nitroglycerin.  He is taking medications as prescribed.  He denies additional symptoms.   Spoke with Dr Anne Fu who advises pt to refrain from longer distance walks until he is seen for his f/u on 05/23/2023.  Continue medications as prescribed.   Pt verbalizes understanding and agrees with current plan.

## 2023-05-19 NOTE — Telephone Encounter (Signed)
Pt stated he has a lump on his wrist where the stent was inserted. He's requesting a callback. Please advise

## 2023-05-19 NOTE — Telephone Encounter (Signed)
Called patient to see if He was interested in participating in the Cardiac Rehab Program. Patient stated yes.

## 2023-05-23 ENCOUNTER — Ambulatory Visit: Payer: Medicare Other | Attending: Physician Assistant | Admitting: Physician Assistant

## 2023-05-23 ENCOUNTER — Encounter: Payer: Self-pay | Admitting: Physician Assistant

## 2023-05-23 VITALS — BP 124/74 | HR 64 | Ht 70.0 in | Wt 168.4 lb

## 2023-05-23 DIAGNOSIS — I214 Non-ST elevation (NSTEMI) myocardial infarction: Secondary | ICD-10-CM | POA: Insufficient documentation

## 2023-05-23 DIAGNOSIS — R030 Elevated blood-pressure reading, without diagnosis of hypertension: Secondary | ICD-10-CM

## 2023-05-23 DIAGNOSIS — Z955 Presence of coronary angioplasty implant and graft: Secondary | ICD-10-CM | POA: Diagnosis not present

## 2023-05-23 DIAGNOSIS — R079 Chest pain, unspecified: Secondary | ICD-10-CM | POA: Diagnosis not present

## 2023-05-23 DIAGNOSIS — Z9889 Other specified postprocedural states: Secondary | ICD-10-CM

## 2023-05-23 MED ORDER — ISOSORBIDE MONONITRATE ER 30 MG PO TB24: 15.0000 mg | ORAL_TABLET | Freq: Every day | ORAL | 3 refills | Status: DC

## 2023-05-23 NOTE — Patient Instructions (Signed)
Medication Instructions:  Your physician has recommended you make the following change in your medication:  Start isosorbide mononitrate (Imdur) 15 mg daily, (this will be 1/2 of a 30 mg tablet) *If you need a refill on your cardiac medications before your next appointment, please call your pharmacy*  Lab Work: Fasting lipids and lft's next week If you have labs (blood work) drawn today and your tests are completely normal, you will receive your results only by: MyChart Message (if you have MyChart) OR A paper copy in the mail If you have any lab test that is abnormal or we need to change your treatment, we will call you to review the results.  Follow-Up: At [facility], you and your health needs are our priority.  As part of our continuing mission to provide you with exceptional heart care, we have created designated Provider Care Teams.  These Care Teams include your primary Cardiologist (physician) and Advanced Practice Providers (APPs -  Physician Assistants and Nurse Practitioners) who all work together to provide you with the care you need, when you need it.  Your next appointment:   3 month(s)  Provider:   Donato Schultz, MD  or APP  Other Instructions Check your blood pressure daily, 1 hr after morning medications for 2 weeks, keep a log and send [place] the readings through mychart at the end of the 2 weeks.   Low-Sodium Eating Plan Salt (sodium) helps you keep a healthy balance of fluids in your body. Too much sodium can raise your blood pressure. It can also cause fluid and waste to be held in your body. Your health care provider or dietitian may recommend a low-sodium eating plan if you have high blood pressure (hypertension), kidney disease, liver disease, or heart failure. Eating less sodium can help lower your blood pressure and reduce swelling. It can also protect your heart, liver, and kidneys. What are tips for following this plan? Reading food labels  Check food  labels for the amount of sodium per serving. If you eat more than one serving, you must multiply the listed amount by the number of servings. Choose foods with less than 140 milligrams (mg) of sodium per serving. Avoid foods with 300 mg of sodium or more per serving. Always check how much sodium is in a product, even if the label says "unsalted" or "no salt added." Shopping  Buy products labeled as "low-sodium" or "no salt added." Buy fresh foods. Avoid canned foods and pre-made or frozen meals. Avoid canned, cured, or processed meats. Buy breads that have less than 80 mg of sodium per slice. Cooking  Eat more home-cooked food. Try to eat less restaurant, buffet, and fast food. Try not to add salt when you cook. Use salt-free seasonings or herbs instead of table salt or sea salt. Check with your provider or pharmacist before using salt substitutes. Cook with plant-based oils, such as canola, sunflower, or olive oil. Meal planning When eating at a restaurant, ask if your food can be made with less salt or no salt. Avoid dishes labeled as brined, pickled, cured, or smoked. Avoid dishes made with soy sauce, miso, or teriyaki sauce. Avoid foods that have monosodium glutamate (MSG) in them. MSG may be added to some restaurant food, sauces, soups, bouillon, and canned foods. Make meals that can be grilled, baked, poached, roasted, or steamed. These are often made with less sodium. General information Try to limit your sodium intake to 1,500-2,300 mg each day, or the amount told  by your provider. What foods should I eat? Fruits Fresh, frozen, or canned fruit. Fruit juice. Vegetables Fresh or frozen vegetables. "No salt added" canned vegetables. "No salt added" tomato sauce and paste. Low-sodium or reduced-sodium tomato and vegetable juice. Grains Low-sodium cereals, such as oats, puffed wheat and rice, and shredded wheat. Low-sodium crackers. Unsalted rice. Unsalted pasta. Low-sodium bread.  Whole grain breads and whole grain pasta. Meats and other proteins Fresh or frozen meat, poultry, seafood, and fish. These should have no added salt. Low-sodium canned tuna and salmon. Unsalted nuts. Dried peas, beans, and lentils without added salt. Unsalted canned beans. Eggs. Unsalted nut butters. Dairy Milk. Soy milk. Cheese that is naturally low in sodium, such as ricotta cheese, fresh mozzarella, or Swiss cheese. Low-sodium or reduced-sodium cheese. Cream cheese. Yogurt. Seasonings and condiments Fresh and dried herbs and spices. Salt-free seasonings. Low-sodium mustard and ketchup. Sodium-free salad dressing. Sodium-free light mayonnaise. Fresh or refrigerated horseradish. Lemon juice. Vinegar. Other foods Homemade, reduced-sodium, or low-sodium soups. Unsalted popcorn and pretzels. Low-salt or salt-free chips. The items listed above may not be all the foods and drinks you can have. Talk to a dietitian to learn more. What foods should I avoid? Vegetables Sauerkraut, pickled vegetables, and relishes. Olives. [place] fries. Onion rings. Regular canned vegetables, except low-sodium or reduced-sodium items. Regular canned tomato sauce and paste. Regular tomato and vegetable juice. Frozen vegetables in sauces. Grains Instant hot cereals. Bread stuffing, pancake, and biscuit mixes. Croutons. Seasoned rice or pasta mixes. Noodle soup cups. Boxed or frozen macaroni and cheese. Regular salted crackers. Self-rising flour. Meats and other proteins Meat or fish that is salted, canned, smoked, spiced, or pickled. Precooked or cured meat, such as sausages or meat loaves. Tomasa Blase. Ham. Pepperoni. Hot dogs. Corned beef. Chipped beef. Salt pork. Jerky. Pickled herring, anchovies, and sardines. Regular canned tuna. Salted nuts. Dairy Processed cheese and cheese spreads. Hard cheeses. Cheese curds. Blue cheese. Feta cheese. String cheese. Regular cottage cheese. Buttermilk. Canned milk. Fats and oils Salted  butter. Regular margarine. Ghee. Bacon fat. Seasonings and condiments Onion salt, garlic salt, seasoned salt, table salt, and sea salt. Canned and packaged gravies. Worcestershire sauce. Tartar sauce. Barbecue sauce. Teriyaki sauce. Soy sauce, including reduced-sodium soy sauce. Steak sauce. Fish sauce. Oyster sauce. Cocktail sauce. Horseradish that you find on the shelf. Regular ketchup and mustard. Meat flavorings and tenderizers. Bouillon cubes. Hot sauce. Pre-made or packaged marinades. Pre-made or packaged taco seasonings. Relishes. Regular salad dressings. Salsa. Other foods Salted popcorn and pretzels. Corn chips and puffs. Potato and tortilla chips. Canned or dried soups. Pizza. Frozen entrees and pot pies. The items listed above may not be all the foods and drinks you should avoid. Talk to a dietitian to learn more. This information is not intended to replace advice given to you by your health care provider. Make sure you discuss any questions you have with your health care provider. Document Revised: 11/21/2022 Document Reviewed: 11/21/2022 Elsevier Patient Education  2024 [employer]  Heart-Healthy Eating Plan Many factors influence your heart health, including eating and exercise habits. Heart health is also called coronary health. Coronary risk increases with abnormal blood fat (lipid) levels. A heart-healthy eating plan includes limiting unhealthy fats, increasing healthy fats, limiting salt (sodium) intake, and making other diet and lifestyle changes. What is my plan? Your health care provider may recommend that: You limit your fat intake to _________% or less of your total calories each day. You limit your saturated fat intake to _________% or less  of your total calories each day. You limit the amount of cholesterol in your diet to less than _________ mg per day. You limit the amount of sodium in your diet to less than _________ mg per day. What are tips for following this  plan? Cooking Cook foods using methods other than frying. Baking, boiling, grilling, and broiling are all good options. Other ways to reduce fat include: Removing the skin from poultry. Removing all visible fats from meats. Steaming vegetables in water or broth. Meal planning  At meals, imagine dividing your plate into fourths: Fill one-half of your plate with vegetables and green salads. Fill one-fourth of your plate with whole grains. Fill one-fourth of your plate with lean protein foods. Eat 2-4 cups of vegetables per day. One cup of vegetables equals 1 cup (91 g) broccoli or cauliflower florets, 2 medium carrots, 1 large bell pepper, 1 large sweet potato, 1 large tomato, 1 medium white potato, 2 cups (150 g) raw leafy greens. Eat 1-2 cups of fruit per day. One cup of fruit equals 1 small apple, 1 large banana, 1 cup (237 g) mixed fruit, 1 large orange,  cup (82 g) dried fruit, 1 cup (240 mL) 100% fruit juice. Eat more foods that contain soluble fiber. Examples include apples, broccoli, carrots, beans, peas, and barley. Aim to get 25-30 g of fiber per day. Increase your consumption of legumes, nuts, and seeds to 4-5 servings per week. One serving of dried beans or legumes equals  cup (90 g) cooked, 1 serving of nuts is  oz (12 almonds, 24 pistachios, or 7 walnut halves), and 1 serving of seeds equals  oz (8 g). Fats Choose healthy fats more often. Choose monounsaturated and polyunsaturated fats, such as olive and canola oils, avocado oil, flaxseeds, walnuts, almonds, and seeds. Eat more omega-3 fats. Choose salmon, mackerel, sardines, tuna, flaxseed oil, and ground flaxseeds. Aim to eat fish at least 2 times each week. Check food labels carefully to identify foods with trans fats or high amounts of saturated fat. Limit saturated fats. These are found in animal products, such as meats, butter, and cream. Plant sources of saturated fats include palm oil, palm kernel oil, and coconut  oil. Avoid foods with partially hydrogenated oils in them. These contain trans fats. Examples are stick margarine, some tub margarines, cookies, crackers, and other baked goods. Avoid fried foods. General information Eat more home-cooked food and less restaurant, buffet, and fast food. Limit or avoid alcohol. Limit foods that are high in added sugar and simple starches such as foods made using white refined flour (white breads, pastries, sweets). Lose weight if you are overweight. Losing just 5-10% of your body weight can help your overall health and prevent diseases such as diabetes and heart disease. Monitor your sodium intake, especially if you have high blood pressure. Talk with your health care provider about your sodium intake. Try to incorporate more vegetarian meals weekly. What foods should I eat? Fruits All fresh, canned (in natural juice), or frozen fruits. Vegetables Fresh or frozen vegetables (raw, steamed, roasted, or grilled). Green salads. Grains Most grains. Choose whole wheat and whole grains most of the time. Rice and pasta, including brown rice and pastas made with whole wheat. Meats and other proteins Lean, well-trimmed beef, veal, pork, and lamb. Chicken and [place] without skin. All fish and shellfish. Wild duck, rabbit, pheasant, and venison. Egg whites or low-cholesterol egg substitutes. Dried beans, peas, lentils, and tofu. Seeds and most nuts. Dairy Low-fat or nonfat  cheeses, including ricotta and mozzarella. Skim or 1% milk (liquid, powdered, or evaporated). Buttermilk made with low-fat milk. Nonfat or low-fat yogurt. Fats and oils Non-hydrogenated (trans-free) margarines. Vegetable oils, including soybean, sesame, sunflower, olive, avocado, peanut, safflower, corn, canola, and cottonseed. Salad dressings or mayonnaise made with a vegetable oil. Beverages Water (mineral or sparkling). Coffee and tea. Unsweetened ice tea. Diet beverages. Sweets and  desserts Sherbet, gelatin, and fruit ice. Small amounts of dark chocolate. Limit all sweets and desserts. Seasonings and condiments All seasonings and condiments. The items listed above may not be a complete list of foods and beverages you can eat. Contact a dietitian for more options. What foods should I avoid? Fruits Canned fruit in heavy syrup. Fruit in cream or butter sauce. Fried fruit. Limit coconut. Vegetables Vegetables cooked in cheese, cream, or butter sauce. Fried vegetables. Grains Breads made with saturated or trans fats, oils, or whole milk. Croissants. Sweet rolls. Donuts. High-fat crackers, such as cheese crackers and chips. Meats and other proteins Fatty meats, such as hot dogs, ribs, sausage, bacon, rib-eye roast or steak. High-fat deli meats, such as salami and bologna. Caviar. Domestic duck and goose. Organ meats, such as liver. Dairy Cream, sour cream, cream cheese, and creamed cottage cheese. Whole-milk cheeses. Whole or 2% milk (liquid, evaporated, or condensed). Whole buttermilk. Cream sauce or high-fat cheese sauce. Whole-milk yogurt. Fats and oils Meat fat, or shortening. Cocoa butter, hydrogenated oils, palm oil, coconut oil, palm kernel oil. Solid fats and shortenings, including bacon fat, salt pork, lard, and butter. Nondairy cream substitutes. Salad dressings with cheese or sour cream. Beverages Regular sodas and any drinks with added sugar. Sweets and desserts Frosting. Pudding. Cookies. Cakes. Pies. Milk chocolate or white chocolate. Buttered syrups. Full-fat ice cream or ice cream drinks. The items listed above may not be a complete list of foods and beverages to avoid. Contact a dietitian for more information. Summary Heart-healthy meal planning includes limiting unhealthy fats, increasing healthy fats, limiting salt (sodium) intake and making other diet and lifestyle changes. Lose weight if you are overweight. Losing just 5-10% of your body weight can help  your overall health and prevent diseases such as diabetes and heart disease. Focus on eating a balance of foods, including fruits and vegetables, low-fat or nonfat dairy, lean protein, nuts and legumes, whole grains, and heart-healthy oils and fats. This information is not intended to replace advice given to you by your health care provider. Make sure you discuss any questions you have with your health care provider. Document Revised: 12/10/2021 Document Reviewed: 12/10/2021 Elsevier Patient Education  2024 [employer].

## 2023-05-23 NOTE — Progress Notes (Signed)
Cardiology Office Note:  .   Date:  05/23/2023  ID:  Paul [name], DOB 23-May-1951, MRN [id number] PCP: Noberto Retort, MD  [facility] Providers Cardiologist:  Donato Schultz, MD {  History of Present Illness: Paul [name] Kitchen   Paul [name] is a 72 y.o. male with a past medical history of HLD and family history of CAD (father died with MI at age 78) who is being seen for follow-up appointment.  Patient is a triathlon athlete who was previously seen by Dr. Anne Fu in 2020 due to symptomatic LAD and LCx calcification noted on CT of the chest.  Underwent a nuclear stress test afterward for which she was noted to be negative for ischemia or infarction.  Had been doing well since then.  Very active bikes greater than 100 miles per week.  Days of admission while he was biking he started noticing sudden onset of pain across his chest, pressure-like sensation associated with nausea which prompted him to stop.  Had never happened before.  The pain lasted approximately 5 minutes and went away.  Started biking again and noticed sudden onset of symptoms after 3 miles.  Decided to stop and call his wife.  Pain resolved at 5 minutes again and since he was so close to him he tried to keep going but only minimal exertion he developed symptoms again and decided to come into the ER.  On his way to the ER had significant shortness of breath which again worsened than usual.  On arrival, resting heart rate between 50 and 60 bpm, BP 150/70, RR 15.  Labs relevant for glucose 187, creatinine 1.1, hemoglobin 15, troponin 13>> 53.  CXR with enlarged cardiomediastinal silhouette.  Given 324 of ASA.  Admitted for plans for cardiac cath.  Found to have NSTEMI.  Cardiac cath noted two-vessel CAD LAD and RCA.  Proximal RCA CTO with left to right collaterals blood flow, proximal to mid LAD treated with FFR guided PCI/DES x 2.  Echo showed LVEF 50 to 55%, moderate LVH, grade 1 DD, mildly reduced RV function, trivial MR.   Recommendations for DAPT with aspirin/Brilinta for at least 1 year.  Seen by cardiac rehab.  Recommended to continue aspirin, Brilinta, and Crestor daily.  He admitted to not being compliant with his statin prior to admission.  Today, he tells me taht when he is walking in his heart gets to 88 he feels some chest tightness as well as pressure in his neck.  When his heart rate gets to 90 to 92 bpm his pain gets a little heavy.  Unfortunately, [place] so he ends up going uphill while being.  We reviewed his most recent labs and his LDL at 211.  Unfortunately, back in the fall he stopped his Crestor because he was tired of taking medication.  We will recheck his lipid panel and LFTs to get more accurate numbers and then titrate his medications as needed.  We discussed starting on Imdur daily for an antianginal medication.  Also, he is anxious to start cardiac rehab ASAP.  He tells me he needs an epidural placed in C7 and T1 however, would require stopping anticoagulation for 48 hours.  He will need to wait 6 months before doing so.  Encouraged to try more conservative measures for pain control in the time being.  He does occasionally take tramadol when the pain gets severe.  No edema, orthopnea, PND. Reports no palpitations.   ROS: Pertinent ROS  in HPI  Studies Reviewed: .        Echo: 05-13-2023   IMPRESSIONS     1. Left ventricular ejection fraction, by estimation, is 50 to 55%. Left  ventricular ejection fraction by 2D MOD biplane is 53.4 %. The left  ventricle has low normal function. The left ventricle has no regional wall  motion abnormalities. There is  moderate left ventricular hypertrophy. Left ventricular diastolic  parameters are consistent with Grade I diastolic dysfunction (impaired  relaxation).   2. Right ventricular systolic function is mildly reduced. The right  ventricular size is normal. Tricuspid regurgitation signal is inadequate  for assessing PA  pressure.   3. Right atrial size was mildly dilated.   4. The mitral valve is abnormal. Trivial mitral valve regurgitation.   5. The aortic valve is tricuspid. Aortic valve regurgitation is trivial.  Aortic valve sclerosis/calcification is present, without any evidence of  aortic stenosis. Aortic valve mean gradient measures 8.0 mmHg.   6. Aortic dilatation noted. There is borderline dilatation of the aortic  root, measuring 39 mm.   7. The inferior vena cava is normal in size with greater than 50%  respiratory variability, suggesting right atrial pressure of 3 mmHg.   Comparison(s): No prior Echocardiogram.   FINDINGS   Left Ventricle: Left ventricular ejection fraction, by estimation, is 50  to 55%. Left ventricular ejection fraction by 2D MOD biplane is 53.4 %.  The left ventricle has low normal function. The left ventricle has no  regional wall motion abnormalities.  The left ventricular internal cavity size was normal in size. There is  moderate left ventricular hypertrophy. Left ventricular diastolic  parameters are consistent with Grade I diastolic dysfunction (impaired  relaxation). Normal left ventricular filling  pressure.   Right Ventricle: The right ventricular size is normal. No increase in  right ventricular wall thickness. Right ventricular systolic function is  mildly reduced. Tricuspid regurgitation signal is inadequate for assessing  PA pressure.   Left Atrium: Left atrial size was normal in size.   Right Atrium: Right atrial size was mildly dilated. Prominent Eustachian  valve.   Pericardium: There is no evidence of pericardial effusion.   Mitral Valve: The mitral valve is abnormal. There is mild calcification of  the anterior mitral valve leaflet(s). Trivial mitral valve regurgitation.   Tricuspid Valve: The tricuspid valve is grossly normal. Tricuspid valve  regurgitation is trivial.   Aortic Valve: The aortic valve is tricuspid. Aortic valve  regurgitation is  trivial. Aortic valve sclerosis/calcification is present, without any  evidence of aortic stenosis. Aortic valve mean gradient measures 8.0 mmHg.  Aortic valve peak gradient  measures 14.9 mmHg. Aortic valve area, by VTI measures 1.92 cm.   Pulmonic Valve: The pulmonic valve was grossly normal. Pulmonic valve  regurgitation is trivial.   Aorta: Aortic dilatation noted. There is borderline dilatation of the  aortic root, measuring 39 mm.   Venous: The inferior vena cava is normal in size with greater than 50%  respiratory variability, suggesting right atrial pressure of 3 mmHg.   IAS/Shunts: The interatrial septum is aneurysmal. No atrial level shunt  detected by color flow Doppler.    Cath: 05/12/2023     Lesion #1 prox LAD lesion is 70% stenosed with 60% stenosed side branch in 1st Diag. 1st Sept lesion is 60% stenosed.   A drug-eluting stent was successfully placed using a SYNERGY XD 3.0X16 -postdilated to 3.6-3.3 mm.  Post intervention, there is a 0% residual stenosis.  TIMI-3 flow pre and post; post sidebranches were stable at 60% stenosis.   Lesion #2 Dist LAD lesion is 80% stenosed.   A drug-eluting stent was successfully placed using a SYNERGY XD 2.50X16 deployed to 2.7 mm.. Post intervention, there is a 0% residual stenosis.  TIMI-3 flow pre and post   -------------------------   Ramus lesion is 60% stenosed.   Mid Cx lesion is 40% stenosed with 50% stenosed side branch in 1st Mrg.   Prox RCA to Dist RCA lesion is 100% stenosed. -RPDA fills via left to right collaterals   -------------------------   The left ventricular systolic function is normal by Echo.  LV end diastolic pressure is normal.   There is no aortic valve stenosis.   -------------------------   Stable low risk ACS patient.  In the absence of any other complications or medical issues, we expect the patient to be ready for discharge from an interventional cardiology perspective on 05/12/2023.    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 TIMI 3 flow pre & post difficult distal LAD DES PCI reducing 80% to 0% (Synergy XD 2.5 x 16 - deployed to 2.7 mm). TIMI 3 flow pre & post with brisk L-RPDA collaterals (difficult due to distal lesion in tortous vesel - required buddy wire & multiple balloon inflations.  Normal LVEDP & normal EF by ECho    RECOMMENDATIONS Aggressive titrate medical therapy for existing CAD including the RCA CTO DAPT for at least 1 year Chest pain stable for same-day discharge today    Bryan Lemma, MD   Diagnostic Dominance: Co-dominant  Intervention    _____________      Physical Exam:   VS:  BP 124/74   Pulse 64   Ht 5\' 10"  (1.778 m)   Wt 168 lb 6.4 oz (76.4 kg)   SpO2 95%   BMI 24.16 kg/m    Wt Readings from Last 3 Encounters:  05/23/23 168 lb 6.4 oz (76.4 kg)  05/10/23 167 lb 12.3 oz (76.1 kg)  05/05/23 171 lb (77.6 kg)    GEN: Well nourished, well developed in no acute distress NECK: No JVD; No carotid bruits CARDIAC: RRR, no murmurs, rubs, gallops RESPIRATORY:  Clear to auscultation without rales, wheezing or rhonchi  ABDOMEN: Soft, non-tender, non-distended EXTREMITIES:  No edema; No deformity   ASSESSMENT AND PLAN: .   1.  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  HLD -Recent LDL to goal (off  statin -Will get updated lipid panel and LFTs and plan to titrate Crestor plus possibly adding Zetia to his regimen for LDL goal less than 55 -Continue lipid-lowering diet  3.  Elevated blood pressure reading -Blood pressure better controlled today 124/74 -Not currently requiring any additional blood pressure pills -Would continue current medication regimen    Cardiac Rehabilitation  Eligibility Assessment  The patient is ready to start cardiac rehabilitation from a cardiac standpoint.      Dispo: He can follow-up in 3 months with Dr. Anne Fu or APP  Signed, Sharlene Dory, PA-C

## 2023-05-26 ENCOUNTER — Ambulatory Visit: Payer: Medicare Other | Attending: Physician Assistant

## 2023-05-26 DIAGNOSIS — R030 Elevated blood-pressure reading, without diagnosis of hypertension: Secondary | ICD-10-CM

## 2023-05-26 DIAGNOSIS — Z955 Presence of coronary angioplasty implant and graft: Secondary | ICD-10-CM

## 2023-05-26 DIAGNOSIS — R079 Chest pain, unspecified: Secondary | ICD-10-CM

## 2023-05-26 DIAGNOSIS — I214 Non-ST elevation (NSTEMI) myocardial infarction: Secondary | ICD-10-CM

## 2023-05-26 DIAGNOSIS — Z9889 Other specified postprocedural states: Secondary | ICD-10-CM | POA: Diagnosis not present

## 2023-05-26 LAB — LIPID PANEL
Chol/HDL Ratio: 3.1 ratio (ref 0.0–5.0)
Cholesterol, Total: 142 mg/dL (ref 100–199)
HDL: 46 mg/dL (ref >39)
LDL Chol Calc (NIH): 75 mg/dL (ref 0–99)
Triglycerides: 116 mg/dL (ref 0–149)
VLDL Cholesterol Cal: 21 mg/dL (ref 5–40)

## 2023-05-26 LAB — HEPATIC FUNCTION PANEL
ALT: 36 IU/L (ref 0–44)
AST: 21 IU/L (ref 0–40)
Albumin: 4.4 g/dL (ref 3.8–4.8)
Alkaline Phosphatase: 83 IU/L (ref 44–121)
Bilirubin Total: 0.5 mg/dL (ref 0.0–1.2)
Bilirubin, Direct: 0.13 mg/dL (ref 0.00–0.40)
Total Protein: 6.5 g/dL (ref 6.0–8.5)

## 2023-05-27 ENCOUNTER — Other Ambulatory Visit: Payer: Self-pay

## 2023-05-27 DIAGNOSIS — M5412 Radiculopathy, cervical region: Secondary | ICD-10-CM

## 2023-05-28 MED ORDER — ISOSORBIDE MONONITRATE ER 30 MG PO TB24: 30.0000 mg | ORAL_TABLET | Freq: Every day | ORAL | 3 refills | Status: DC

## 2023-05-28 MED ORDER — TICAGRELOR 90 MG PO TABS: 90.0000 mg | ORAL_TABLET | Freq: Two times a day (BID) | ORAL | 3 refills | Status: DC

## 2023-05-28 NOTE — Telephone Encounter (Signed)
  Have had some patients in the past ask, usually defer to MD to see if they're ok with it (I don't think [facility] has a set policy in place regarding meds from [place]). Agree would probably be easier to change him to cheaper antiplatelet medication instead.   [place], [place], PA-C  [place], [place], RN; Supple, Megan E, RPH-CPP; [facility] [address] [phone] minutes ago (7:33 AM)    We can do a 90 day Brilinta 90mg  BID prescription but please let the patient know if cost is an issue there are other antiplatelet options we can switch to that may be more compatible with his insurance and a lower cost.  Aundra Millet, have you heard of patients trying to get a generic from [place]? Burnett Kanaris.  Thanks! Sharlene Dory, PA-C    Pt to come by and pick up RX for Brilinta.. I advised him that we can offer a cheaper option but he declined and says he will let us know but for now would like a written RX. Will leave at the front for him to pick up.

## 2023-05-29 ENCOUNTER — Other Ambulatory Visit (HOSPITAL_COMMUNITY): Payer: Self-pay

## 2023-05-29 DIAGNOSIS — R293 Abnormal posture: Secondary | ICD-10-CM | POA: Diagnosis not present

## 2023-05-29 DIAGNOSIS — M25511 Pain in right shoulder: Secondary | ICD-10-CM | POA: Diagnosis not present

## 2023-05-29 DIAGNOSIS — M7541 Impingement syndrome of right shoulder: Secondary | ICD-10-CM | POA: Diagnosis not present

## 2023-05-29 DIAGNOSIS — M25611 Stiffness of right shoulder, not elsewhere classified: Secondary | ICD-10-CM | POA: Diagnosis not present

## 2023-05-29 DIAGNOSIS — M5412 Radiculopathy, cervical region: Secondary | ICD-10-CM | POA: Diagnosis not present

## 2023-05-29 MED ORDER — PRASUGREL HCL 10 MG PO TABS
ORAL_TABLET | ORAL | 3 refills | Status: DC
  Filled 2023-05-29 – 2023-06-04 (×2): qty 90, 90d supply, fill #0
  Filled 2023-08-17 – 2023-08-24 (×2): qty 90, 90d supply, fill #1
  Filled 2023-11-21: qty 90, 90d supply, fill #2
  Filled 2024-02-13 – 2024-02-14 (×2): qty 90, 90d supply, fill #3

## 2023-05-29 NOTE — Addendum Note (Signed)
Addended by: Malena Peer D on: 05/29/2023 02:39 PM   Modules accepted: Orders

## 2023-05-30 ENCOUNTER — Encounter (HOSPITAL_COMMUNITY): Payer: Self-pay

## 2023-05-30 ENCOUNTER — Other Ambulatory Visit (HOSPITAL_COMMUNITY): Payer: Self-pay

## 2023-05-30 NOTE — Progress Notes (Signed)
[facility] [address] [place] [address] Phone: [phone] Subjective:   Paul [name], am serving as a scribe for Dr. Antoine Primas.  I'm seeing this patient by the request  of:  Paul Retort, MD  CC: Neck pain follow-up  [id number]  [name] is a 72 y.o. male coming in with complaint of back and neck pain. OMT on 04/30/2023. Patient states that he is having the issue with the right shoulder where pain radiates down the arm to the tennis elbow that is getting better. Patient is wanting to talk about his pain management options since he cannot be off of his blood thinner for three months to get his epidural.           Reviewed prior external information including notes and imaging from previsou exam, outside providers and external EMR if available.   As well as notes that were available from care everywhere and other healthcare systems.  Past medical history, social, surgical and family history all reviewed in electronic medical record.  No pertanent information unless stated regarding to the chief complaint.   Past Medical History:  Diagnosis Date   Cataract    Depression    RESOLVED   Hyperlipidemia    Spinal stenosis    WAS HAVING NECK PAIN -PT STATES NERVE ABLATION PROCEDURE 2012 AND PAIN HAS RESOLVED   Spondylitis (HCC)     Allergies  Allergen Reactions   Crestor [Rosuvastatin] Other (See Comments)    Myalgias  Pt currently taking 20mg  QD   Lipitor [Atorvastatin] Other (See Comments)    Liver toxicity     Review of Systems:  No headache, visual changes, nausea, vomiting, diarrhea, constipation, dizziness, abdominal pain, skin rash, fevers, chills, night sweats, weight loss, swollen lymph nodes, body aches, joint swelling, chest pain, shortness of breath, mood changes. POSITIVE muscle aches  Objective  Blood pressure 110/70, pulse 70, height 5\' 11"  (1.803 m), weight 168 lb (76.2 kg), SpO2 95%.    General: No apparent distress alert and oriented x3 mood and affect normal, dressed appropriately.  HEENT: Pupils equal, extraocular movements intact  Respiratory: Patient's speak in full sentences and does not appear short of breath  Cardiovascular: No lower extremity edema, non tender, no erythema  Neck exam does show some loss of lordosis noted.  Some tenderness to palpation noted.  Osteopathic findings  C2 flexed rotated and side bent right C7 flexed rotated and side bent left T3 extended rotated and side bent right inhaled rib T7 extended rotated and side bent left L2 flexed rotated and side bent right Sacrum right on right    Assessment and Plan:  Degenerative disc disease, cervical Significant neck arthritis noted.  Need to talk about pain management while patient waits to be able to do the epidural and come off of the blood thinner.  Discussed with patient about gabapentin again 200 mg.  Will restart this that has helped him in the past.  Once again warned of potential side effects.  Given a refill of tramadol to use for breakthrough pain as well.  Discussed taking it with the Tylenol.  Patient is in agreement with the plan.  Follow-up again 6 to 8 weeks.    Nonallopathic problems  Decision today to treat with OMT was based on Physical Exam  After verbal consent patient was treated with HVLA, ME, FPR techniques in cervical, rib, thoracic, lumbar, and sacral  areas  Patient tolerated the procedure well with  improvement in symptoms  Patient given exercises, stretches and lifestyle modifications  See medications in patient instructions if given  Patient will follow up in 4-8 weeks      The above documentation has been reviewed and is accurate and complete Judi Saa, DO        Note: This dictation was prepared with Dragon dictation along with smaller phrase technology. Any transcriptional errors that result from this process are unintentional.

## 2023-06-02 ENCOUNTER — Telehealth (HOSPITAL_COMMUNITY): Payer: Self-pay

## 2023-06-03 ENCOUNTER — Other Ambulatory Visit (HOSPITAL_COMMUNITY): Payer: Self-pay

## 2023-06-03 ENCOUNTER — Ambulatory Visit: Payer: Medicare Other | Admitting: Family Medicine

## 2023-06-03 ENCOUNTER — Encounter: Payer: Self-pay | Admitting: Family Medicine

## 2023-06-03 ENCOUNTER — Encounter (HOSPITAL_COMMUNITY)
Admission: RE | Admit: 2023-06-03 | Discharge: 2023-06-03 | Disposition: A | Discharge status: Home / Self Care | Payer: Medicare Other | Source: Ambulatory Visit | Attending: Cardiology | Admitting: Cardiology

## 2023-06-03 VITALS — BP 110/70 | HR 70 | Ht 71.0 in | Wt 168.0 lb

## 2023-06-03 VITALS — BP 114/72 | HR 76 | Ht 71.0 in | Wt 170.9 lb

## 2023-06-03 DIAGNOSIS — M9908 Segmental and somatic dysfunction of rib cage: Secondary | ICD-10-CM | POA: Diagnosis not present

## 2023-06-03 DIAGNOSIS — Z955 Presence of coronary angioplasty implant and graft: Secondary | ICD-10-CM | POA: Diagnosis not present

## 2023-06-03 DIAGNOSIS — M9903 Segmental and somatic dysfunction of lumbar region: Secondary | ICD-10-CM

## 2023-06-03 DIAGNOSIS — R293 Abnormal posture: Secondary | ICD-10-CM | POA: Diagnosis not present

## 2023-06-03 DIAGNOSIS — I214 Non-ST elevation (NSTEMI) myocardial infarction: Secondary | ICD-10-CM | POA: Insufficient documentation

## 2023-06-03 DIAGNOSIS — M9901 Segmental and somatic dysfunction of cervical region: Secondary | ICD-10-CM

## 2023-06-03 DIAGNOSIS — M9902 Segmental and somatic dysfunction of thoracic region: Secondary | ICD-10-CM | POA: Diagnosis not present

## 2023-06-03 DIAGNOSIS — M25611 Stiffness of right shoulder, not elsewhere classified: Secondary | ICD-10-CM | POA: Diagnosis not present

## 2023-06-03 DIAGNOSIS — M7541 Impingement syndrome of right shoulder: Secondary | ICD-10-CM | POA: Diagnosis not present

## 2023-06-03 DIAGNOSIS — M25511 Pain in right shoulder: Secondary | ICD-10-CM | POA: Diagnosis not present

## 2023-06-03 DIAGNOSIS — M5412 Radiculopathy, cervical region: Secondary | ICD-10-CM | POA: Diagnosis not present

## 2023-06-03 DIAGNOSIS — M9904 Segmental and somatic dysfunction of sacral region: Secondary | ICD-10-CM

## 2023-06-03 DIAGNOSIS — M5030 Other cervical disc degeneration, unspecified cervical region: Secondary | ICD-10-CM

## 2023-06-03 MED ORDER — TRAMADOL HCL 50 MG PO TABS
50.0000 mg | ORAL_TABLET | Freq: Three times a day (TID) | ORAL | 0 refills | Status: AC | PRN
Start: 2023-06-03 — End: 2023-06-08
  Filled 2023-06-03: qty 15, 5d supply, fill #0

## 2023-06-03 MED ORDER — GABAPENTIN 100 MG PO CAPS
200.0000 mg | ORAL_CAPSULE | Freq: Every day | ORAL | 3 refills | Status: DC
  Filled 2023-06-03: qty 60, 30d supply, fill #0
  Filled 2023-06-19 – 2023-06-20 (×2): qty 60, 30d supply, fill #1

## 2023-06-03 NOTE — Patient Instructions (Signed)
Good to see you  So glad you are doing better Sending in tramadol and gabapentin Tramadol only if you really need it Gabapentin 200mg  nightly See me again in 5-8 weeks

## 2023-06-03 NOTE — Progress Notes (Addendum)
Cardiac Rehab Medication Review   Does the patient  feel that his/her medications are working for him/her?  yes  Has the patient been experiencing any side effects to the medications prescribed?  no  Does the patient measure his/her own blood pressure or blood glucose at home?  yes   Does the patient have any problems obtaining medications due to transportation or finances?   no  Understanding of regimen: excellent Understanding of indications: excellent Potential of compliance: excellent    Comments: Pt checks blood pressure daily. Pt has no questions about his medications.   [name] 06/03/2023 9:23 AM

## 2023-06-03 NOTE — Assessment & Plan Note (Signed)
Significant neck arthritis noted.  Need to talk about pain management while patient waits to be able to do the epidural and come off of the blood thinner.  Discussed with patient about gabapentin again 200 mg.  Will restart this that has helped him in the past.  Once again warned of potential side effects.  Given a refill of tramadol to use for breakthrough pain as well.  Discussed taking it with the Tylenol.  Patient is in agreement with the plan.  Follow-up again 6 to 8 weeks.

## 2023-06-03 NOTE — Progress Notes (Signed)
Cardiac Individual Treatment Plan  Patient Details  Name: Paul [name] MRN: [id number] Date of Birth: 1951/01/28 Referring Provider:   Flowsheet Row INTENSIVE CARDIAC REHAB ORIENT from 06/03/2023 in [facility]  Referring Provider Donato Schultz, MD       Initial Encounter Date:  Flowsheet Row INTENSIVE CARDIAC REHAB ORIENT from 06/03/2023 in [facility]  Date 06/03/23       Visit Diagnosis: 05/10/23 NSTEMI (non-ST elevated myocardial infarction) (HCC)  05/12/23 Status post coronary artery stent placement  Patient's Home Medications on Admission:  Current Outpatient Medications:    aspirin EC 81 MG tablet, Take 1 tablet (81 mg total) by mouth daily. Swallow whole., Disp: 120 tablet, Rfl: 2   DULoxetine (CYMBALTA) 20 MG capsule, TAKE 2 CAPSULES BY MOUTH DAILY, Disp: 60 capsule, Rfl: 3   isosorbide mononitrate (IMDUR) 30 MG 24 hr tablet, Take 1 tablet (30 mg total) by mouth daily., Disp: 90 tablet, Rfl: 3   nitroGLYCERIN (NITROSTAT) 0.4 MG SL tablet, Place 1 tablet (0.4 mg total) under the tongue every 5 (five) minutes x 3 doses as needed for chest pain., Disp: 25 tablet, Rfl: 2   rosuvastatin (CRESTOR) 20 MG tablet, Take 1 tablet (20 mg total) by mouth daily., Disp: 90 tablet, Rfl: 3   ticagrelor (BRILINTA) 90 MG TABS tablet, Take by mouth 2 (two) times daily., Disp: , Rfl:    traMADol (ULTRAM) 50 MG tablet, Take 50 mg by mouth as needed., Disp: , Rfl:    Vitamin D, Ergocalciferol, (DRISDOL) 1.25 MG (50000 UNIT) CAPS capsule, TAKE 1 CAPSULE (50,000 UNITS TOTAL) BY MOUTH EVERY 7 (SEVEN) DAYS (Patient taking differently: Take 50,000 Units by mouth every Wednesday.), Disp: 12 capsule, Rfl: 0   prasugrel (EFFIENT) 10 MG TABS tablet, Take 6 tablets by mouth 24 hours after last Brilinta dose. Then 24 hours later take 1 tablet (10mg ) by mouth daily. (Patient not taking:  Reported on 06/03/2023), Disp: 90 tablet, Rfl: 3   sildenafil (VIAGRA) 100 MG tablet, Take 100 mg by mouth daily as needed for erectile dysfunction. (Patient not taking: Reported on 06/03/2023), Disp: , Rfl:   Past Medical History: Past Medical History:  Diagnosis Date   Cataract    Depression    RESOLVED   Hyperlipidemia    Spinal stenosis    WAS HAVING NECK PAIN -PT STATES NERVE ABLATION PROCEDURE 2012 AND PAIN HAS RESOLVED   Spondylitis (HCC)     Tobacco Use: Social History   Tobacco Use  Smoking Status Never  Smokeless Tobacco Never    Labs: Review Flowsheet       Latest Ref Rng & Units 04/18/2018 05/26/2019 05/11/2023 05/26/2023  Labs for ITP Cardiac and Pulmonary Rehab  Cholestrol 100 - 199 mg/dL - 782  - 956   LDL (calc) 0 - 99 mg/dL - 58  - 75   HDL-C >21 mg/dL - 80  - 46   Trlycerides 0 - 149 mg/dL - 96  - 308   Hemoglobin A1c 4.8 - 5.6 % - - 5.1  -  TCO2 22 - 32 mmol/L 29  - - -    Details            Capillary Blood Glucose: No results found for: "GLUCAP"   Exercise Target Goals: Exercise Program Goal: Individual exercise prescription set using results from initial 6 min walk test and THRR while considering  patient's activity  barriers and safety.   Exercise Prescription Goal: Initial exercise prescription builds to 30-45 minutes a day of aerobic activity, 2-3 days per week.  Home exercise guidelines will be given to patient during program as part of exercise prescription that the participant will acknowledge.  Activity Barriers & Risk Stratification:  Activity Barriers & Cardiac Risk Stratification - 06/03/23 1109       Activity Barriers & Cardiac Risk Stratification   Activity Barriers Arthritis;Joint Problems;Neck/Spine Problems;Chest Pain/Angina    Cardiac Risk Stratification Moderate             6 Minute Walk:  6 Minute Walk     Row Name 06/03/23 1108         6 Minute Walk   Phase Initial     Distance 1981 feet     Walk Time 6  minutes     # of Rest Breaks 0     MPH 3.8     METS 4.14     RPE 11     Perceived Dyspnea  0     VO2 Peak 14.5     Symptoms No     Resting HR 76 bpm     Resting BP 114/72     Resting Oxygen Saturation  96 %     Exercise Oxygen Saturation  during 6 min walk 96 %     Max Ex. HR 99 bpm     Max Ex. BP 120/70     2 Minute Post BP 112/68              Oxygen Initial Assessment:   Oxygen Re-Evaluation:   Oxygen Discharge (Final Oxygen Re-Evaluation):   Initial Exercise Prescription:  Initial Exercise Prescription - 06/03/23 1100       Date of Initial Exercise RX and Referring Provider   Date 06/03/23    Referring Provider Donato Schultz, MD    Expected Discharge Date 08/20/23      Treadmill   MPH 3.4    Grade 1    Minutes 15    METs 4.07      Bike   Level 3    Watts 50    Minutes 15    METs 4.1      Prescription Details   Frequency (times per week) 3    Duration Progress to 30 minutes of continuous aerobic without signs/symptoms of physical distress      Intensity   THRR 40-80% of Max Heartrate 59-118    Ratings of Perceived Exertion 11-13    Perceived Dyspnea 0-4      Progression   Progression Continue progressive overload as per policy without signs/symptoms or physical distress.      Resistance Training   Training Prescription Yes    Weight 3lbs    Reps 10-15             Perform Capillary Blood Glucose checks as needed.  Exercise Prescription Changes:   Exercise Comments:   Exercise Goals and Review:   Exercise Goals     Row Name 06/03/23 1109             Exercise Goals   Increase Physical Activity Yes       Intervention Provide advice, education, support and counseling about physical activity/exercise needs.;Develop an individualized exercise prescription for aerobic and resistive training based on initial evaluation findings, risk stratification, comorbidities and participant's personal goals.       Expected Outcomes Short  Term: Attend rehab on a regular  basis to increase amount of physical activity.;Long Term: Add in home exercise to make exercise part of routine and to increase amount of physical activity.;Long Term: Exercising regularly at least 3-5 days a week.       Increase Strength and Stamina Yes       Intervention Provide advice, education, support and counseling about physical activity/exercise needs.;Develop an individualized exercise prescription for aerobic and resistive training based on initial evaluation findings, risk stratification, comorbidities and participant's personal goals.       Expected Outcomes Short Term: Increase workloads from initial exercise prescription for resistance, speed, and METs.;Short Term: Perform resistance training exercises routinely during rehab and add in resistance training at home;Long Term: Improve cardiorespiratory fitness, muscular endurance and strength as measured by increased METs and functional capacity ( )       Able to understand and use rate of perceived exertion (RPE) scale Yes       Intervention Provide education and explanation on how to use RPE scale       Expected Outcomes Short Term: Able to use RPE daily in rehab to express subjective intensity level;Long Term:  Able to use RPE to guide intensity level when exercising independently       Knowledge and understanding of Target Heart Rate Range (THRR) Yes       Intervention Provide education and explanation of THRR including how the numbers were predicted and where they are located for reference       Expected Outcomes Short Term: Able to state/look up THRR;Long Term: Able to use THRR to govern intensity when exercising independently;Short Term: Able to use daily as guideline for intensity in rehab       Understanding of Exercise Prescription Yes       Intervention Provide education, explanation, and written materials on patient's individual exercise prescription       Expected Outcomes Short Term: Able to  explain program exercise prescription;Long Term: Able to explain home exercise prescription to exercise independently                Exercise Goals Re-Evaluation :   Discharge Exercise Prescription (Final Exercise Prescription Changes):   Nutrition:  Target Goals: Understanding of nutrition guidelines, daily intake of sodium 1500mg , cholesterol 200mg , calories 30% from fat and 7% or less from saturated fats, daily to have 5 or more servings of fruits and vegetables.  Biometrics:  Pre Biometrics - 06/03/23 1110       Pre Biometrics   Waist Circumference 37.5 inches    Hip Circumference 37.75 inches    Waist to Hip Ratio 0.99 %    Triceps Skinfold 7 mm    % Body Fat 21.9 %    Grip Strength 36 kg    Flexibility 14.25 in    Single Leg Stand 30 seconds              Nutrition Therapy Plan and Nutrition Goals:   Nutrition Assessments:  MEDIFICTS Score Key: ?70 Need to make dietary changes  40-70 Heart Healthy Diet ? 40 Therapeutic Level Cholesterol Diet    Picture Your Plate Scores: <40 Unhealthy dietary pattern with much room for improvement. 41-50 Dietary pattern unlikely to meet recommendations for good health and room for improvement. 51-60 More healthful dietary pattern, with some room for improvement.  >60 Healthy dietary pattern, although there may be some specific behaviors that could be improved.    Nutrition Goals Re-Evaluation:   Nutrition Goals Re-Evaluation:   Nutrition Goals Discharge (Final Nutrition  Goals Re-Evaluation):   Psychosocial: Target Goals: Acknowledge presence or absence of significant depression and/or stress, maximize coping skills, provide positive support system. Participant is able to verbalize types and ability to use techniques and skills needed for reducing stress and depression.  Initial Review & Psychosocial Screening:  Initial Psych Review & Screening - 06/03/23 1040       Initial Review   Current issues with  Current Stress Concerns    Comments Pt has just recently moved into a new home      Family Dynamics   Good Support System? Yes   Has spouse, daughter and dogs     Barriers   Psychosocial barriers to participate in program The patient should benefit from training in stress management and relaxation.      Screening Interventions   Interventions Encouraged to exercise             Quality of Life Scores:  Quality of Life - 06/03/23 1105       Quality of Life   Select Quality of Life      Quality of Life Scores   Health/Function Pre 24.83 %    Socioeconomic Pre 25.6 %    Psych/Spiritual Pre 25.75 %    Family Pre 26.4 %    GLOBAL Pre 25.39 %            Scores of 19 and below usually indicate a poorer quality of life in these areas.  A difference of  2-3 points is a clinically meaningful difference.  A difference of 2-3 points in the total score of the Quality of Life Index has been associated with significant improvement in overall quality of life, self-image, physical symptoms, and general health in studies assessing change in quality of life.  PHQ-9: Review Flowsheet  More data may exist      06/03/2023 10/16/2015 09/12/2015 07/18/2015 07/07/2015  Depression screen PHQ 2/9  Decreased Interest 1 0 0 0 0 0  Down, Depressed, Hopeless 1 0 0 0 0 0  PHQ - 2 Score 2 0 0 0 0 0  Altered sleeping 0 - - - -  Tired, decreased energy 1 - - - -  Change in appetite 1 - - - -  Feeling bad or failure about yourself  0 - - - -  Trouble concentrating 0 - - - -  Moving slowly or fidgety/restless 0 - - - -  Suicidal thoughts 0 - - - -  PHQ-9 Score 4 - - - -  Difficult doing work/chores Not difficult at all - - - -    Details       Multiple values from one day are sorted in reverse-chronological order        Interpretation of Total Score  Total Score Depression Severity:  1-4 = Minimal depression, 5-9 = Mild depression, 10-14 = Moderate depression, 15-19 = Moderately severe  depression, 20-27 = Severe depression   Psychosocial Evaluation and Intervention:   Psychosocial Re-Evaluation:   Psychosocial Discharge (Final Psychosocial Re-Evaluation):   Vocational Rehabilitation: Provide vocational rehab assistance to qualifying candidates.   Vocational Rehab Evaluation & Intervention:  Vocational Rehab - 06/03/23 1041       Initial Vocational Rehab Evaluation & Intervention   Assessment shows need for Vocational Rehabilitation No   Pt is retitred, no needs            Education: Education Goals: Education classes will be provided on a weekly basis, covering required topics. Participant will state  understanding/return demonstration of topics presented.     Core Videos: Exercise    Move It!  Clinical staff conducted group or individual video education with verbal and written material and guidebook.  Patient learns the recommended Pritikin exercise program. Exercise with the goal of living a long, healthy life. Some of the health benefits of exercise include controlled diabetes, healthier blood pressure levels, improved cholesterol levels, improved heart and lung capacity, improved sleep, and better body composition. Everyone should speak with their doctor before starting or changing an exercise routine.  Biomechanical Limitations Clinical staff conducted group or individual video education with verbal and written material and guidebook.  Patient learns how biomechanical limitations can impact exercise and how we can mitigate and possibly overcome limitations to have an impactful and balanced exercise routine.  Body Composition Clinical staff conducted group or individual video education with verbal and written material and guidebook.  Patient learns that body composition (ratio of muscle mass to fat mass) is a key component to assessing overall fitness, rather than body weight alone. Increased fat mass, especially visceral belly fat, can put [place] at  increased risk for metabolic syndrome, type 2 diabetes, heart disease, and even death. It is recommended to combine diet and exercise (cardiovascular and resistance training) to improve your body composition. Seek guidance from your physician and exercise physiologist before implementing an exercise routine.  Exercise Action Plan Clinical staff conducted group or individual video education with verbal and written material and guidebook.  Patient learns the recommended strategies to achieve and enjoy long-term exercise adherence, including variety, self-motivation, self-efficacy, and positive decision making. Benefits of exercise include fitness, good health, weight management, more energy, better sleep, less stress, and overall well-being.  Medical   Heart Disease Risk Reduction Clinical staff conducted group or individual video education with verbal and written material and guidebook.  Patient learns our heart is our most vital organ as it circulates oxygen, nutrients, white blood cells, and hormones throughout the entire body, and carries waste away. Data supports a plant-based eating plan like the Pritikin Program for its effectiveness in slowing progression of and reversing heart disease. The video provides a number of recommendations to address heart disease.   Metabolic Syndrome and Belly Fat  Clinical staff conducted group or individual video education with verbal and written material and guidebook.  Patient learns what metabolic syndrome is, how it leads to heart disease, and how one can reverse it and keep it from coming back. You have metabolic syndrome if you have 3 of the following 5 criteria: abdominal obesity, high blood pressure, high triglycerides, low HDL cholesterol, and high blood sugar.  Hypertension and Heart Disease Clinical staff conducted group or individual video education with verbal and written material and guidebook.  Patient learns that high blood pressure, or  hypertension, is very common in the [place]. Hypertension is largely due to excessive salt intake, but other important risk factors include being overweight, physical inactivity, drinking too much alcohol, smoking, and not eating enough potassium from fruits and vegetables. High blood pressure is a leading risk factor for heart attack, stroke, congestive heart failure, dementia, kidney failure, and premature death. Long-term effects of excessive salt intake include stiffening of the arteries and thickening of heart muscle and organ damage. Recommendations include ways to reduce hypertension and the risk of heart disease.  Diseases of Our Time - Focusing on Diabetes Clinical staff conducted group or individual video education with verbal and written material and guidebook.  Patient learns why  the best way to stop diseases of our time is prevention, through food and other lifestyle changes. Medicine (such as prescription pills and surgeries) is often only a Band-Aid on the problem, not a long-term solution. Most common diseases of our time include obesity, type 2 diabetes, hypertension, heart disease, and cancer. The Pritikin Program is recommended and has been proven to help reduce, reverse, and/or prevent the damaging effects of metabolic syndrome.  Nutrition   Overview of the Pritikin Eating Plan  Clinical staff conducted group or individual video education with verbal and written material and guidebook.  Patient learns about the Pritikin Eating Plan for disease risk reduction. The Pritikin Eating Plan emphasizes a wide variety of unrefined, minimally-processed carbohydrates, like fruits, vegetables, whole grains, and legumes. Go, Caution, and Stop food choices are explained. Plant-based and lean animal proteins are emphasized. Rationale provided for low sodium intake for blood pressure control, low added sugars for blood sugar stabilization, and low added fats and oils for coronary artery disease  risk reduction and weight management.  Calorie Density  Clinical staff conducted group or individual video education with verbal and written material and guidebook.  Patient learns about calorie density and how it impacts the Pritikin Eating Plan. Knowing the characteristics of the food you choose will help you decide whether those foods will lead to weight gain or weight loss, and whether you want to consume more or less of them. Weight loss is usually a side effect of the Pritikin Eating Plan because of its focus on low calorie-dense foods.  Label Reading  Clinical staff conducted group or individual video education with verbal and written material and guidebook.  Patient learns about the Pritikin recommended label reading guidelines and corresponding recommendations regarding calorie density, added sugars, sodium content, and whole grains.  Dining Out - Part 1  Clinical staff conducted group or individual video education with verbal and written material and guidebook.  Patient learns that restaurant meals can be sabotaging because they can be so high in calories, fat, sodium, and/or sugar. Patient learns recommended strategies on how to positively address this and avoid unhealthy pitfalls.  Facts on Fats  Clinical staff conducted group or individual video education with verbal and written material and guidebook.  Patient learns that lifestyle modifications can be just as effective, if not more so, as many medications for lowering your risk of heart disease. A Pritikin lifestyle can help to reduce your risk of inflammation and atherosclerosis (cholesterol build-up, or plaque, in the artery walls). Lifestyle interventions such as dietary choices and physical activity address the cause of atherosclerosis. A review of the types of fats and their impact on blood cholesterol levels, along with dietary recommendations to reduce fat intake is also included.  Nutrition Action Plan  Clinical staff  conducted group or individual video education with verbal and written material and guidebook.  Patient learns how to incorporate Pritikin recommendations into their lifestyle. Recommendations include planning and keeping personal health goals in mind as an important part of their success.  Healthy Mind-Set    Healthy Minds, Bodies, Hearts  Clinical staff conducted group or individual video education with verbal and written material and guidebook.  Patient learns how to identify when they are stressed. Video will discuss the impact of that stress, as well as the many benefits of stress management. Patient will also be introduced to stress management techniques. The way we think, act, and feel has an impact on our hearts.  How Our Thoughts Can Heal Our  Hearts  Clinical staff conducted group or individual video education with verbal and written material and guidebook.  Patient learns that negative thoughts can cause depression and anxiety. This can result in negative lifestyle behavior and serious health problems. Cognitive behavioral therapy is an effective method to help control our thoughts in order to change and improve our emotional outlook.  Additional Videos:  Exercise    Improving Performance  Clinical staff conducted group or individual video education with verbal and written material and guidebook.  Patient learns to use a non-linear approach by alternating intensity levels and lengths of time spent exercising to help burn more calories and lose more body fat. Cardiovascular exercise helps improve heart health, metabolism, hormonal balance, blood sugar control, and recovery from fatigue. Resistance training improves strength, endurance, balance, coordination, reaction time, metabolism, and muscle mass. Flexibility exercise improves circulation, posture, and balance. Seek guidance from your physician and exercise physiologist before implementing an exercise routine and learn your capabilities  and proper form for all exercise.  Introduction to Yoga  Clinical staff conducted group or individual video education with verbal and written material and guidebook.  Patient learns about yoga, a discipline of the coming together of mind, breath, and body. The benefits of yoga include improved flexibility, improved range of motion, better posture and core strength, increased lung function, weight loss, and positive self-image. Yoga's heart health benefits include lowered blood pressure, healthier heart rate, decreased cholesterol and triglyceride levels, improved immune function, and reduced stress. Seek guidance from your physician and exercise physiologist before implementing an exercise routine and learn your capabilities and proper form for all exercise.  Medical   Aging: Enhancing Your Quality of Life  Clinical staff conducted group or individual video education with verbal and written material and guidebook.  Patient learns key strategies and recommendations to stay in good physical health and enhance quality of life, such as prevention strategies, having an advocate, securing a Health Care Proxy and Power of Attorney, and keeping a list of medications and system for tracking them. It also discusses how to avoid risk for bone loss.  Biology of Weight Control  Clinical staff conducted group or individual video education with verbal and written material and guidebook.  Patient learns that weight gain occurs because we consume more calories than we burn (eating more, moving less). Even if your body weight is normal, you may have higher ratios of fat compared to muscle mass. Too much body fat puts you at increased risk for cardiovascular disease, heart attack, stroke, type 2 diabetes, and obesity-related cancers. In addition to exercise, following the Pritikin Eating Plan can help reduce your risk.  Decoding Lab Results  Clinical staff conducted group or individual video education with verbal and  written material and guidebook.  Patient learns that lab test reflects one measurement whose values change over time and are influenced by many factors, including medication, stress, sleep, exercise, food, hydration, pre-existing medical conditions, and more. It is recommended to use the knowledge from this video to become more involved with your lab results and evaluate your numbers to speak with your doctor.   Diseases of Our Time - Overview  Clinical staff conducted group or individual video education with verbal and written material and guidebook.  Patient learns that according to the CDC, 50% to 70% of chronic diseases (such as obesity, type 2 diabetes, elevated lipids, hypertension, and heart disease) are avoidable through lifestyle improvements including healthier food choices, listening to satiety cues, and increased physical activity.  Sleep Disorders Clinical staff conducted group or individual video education with verbal and written material and guidebook.  Patient learns how good quality and duration of sleep are important to overall health and well-being. Patient also learns about sleep disorders and how they impact health along with recommendations to address them, including discussing with a physician.  Nutrition  Dining Out - Part 2 Clinical staff conducted group or individual video education with verbal and written material and guidebook.  Patient learns how to plan ahead and communicate in order to maximize their dining experience in a healthy and nutritious manner. Included are recommended food choices based on the type of restaurant the patient is visiting.   Fueling a Banker conducted group or individual video education with verbal and written material and guidebook.  There is a strong connection between our food choices and our health. Diseases like obesity and type 2 diabetes are very prevalent and are in large-part due to lifestyle choices. The  Pritikin Eating Plan provides plenty of food and hunger-curbing satisfaction. It is easy to follow, affordable, and helps reduce health risks.  Menu Workshop  Clinical staff conducted group or individual video education with verbal and written material and guidebook.  Patient learns that restaurant meals can sabotage health goals because they are often packed with calories, fat, sodium, and sugar. Recommendations include strategies to plan ahead and to communicate with the manager, chef, or server to help order a healthier meal.  Planning Your Eating Strategy  Clinical staff conducted group or individual video education with verbal and written material and guidebook.  Patient learns about the Pritikin Eating Plan and its benefit of reducing the risk of disease. The Pritikin Eating Plan does not focus on calories. Instead, it emphasizes high-quality, nutrient-rich foods. By knowing the characteristics of the foods, we choose, we can determine their calorie density and make informed decisions.  Targeting Your Nutrition Priorities  Clinical staff conducted group or individual video education with verbal and written material and guidebook.  Patient learns that lifestyle habits have a tremendous impact on disease risk and progression. This video provides eating and physical activity recommendations based on your personal health goals, such as reducing LDL cholesterol, losing weight, preventing or controlling type 2 diabetes, and reducing high blood pressure.  Vitamins and Minerals  Clinical staff conducted group or individual video education with verbal and written material and guidebook.  Patient learns different ways to obtain key vitamins and minerals, including through a recommended healthy diet. It is important to discuss all supplements you take with your doctor.   Healthy Mind-Set    Smoking Cessation  Clinical staff conducted group or individual video education with verbal and written  material and guidebook.  Patient learns that cigarette smoking and tobacco addiction pose a serious health risk which affects millions of people. Stopping smoking will significantly reduce the risk of heart disease, lung disease, and many forms of cancer. Recommended strategies for quitting are covered, including working with your doctor to develop a successful plan.  Culinary   Becoming a Set designer conducted group or individual video education with verbal and written material and guidebook.  Patient learns that cooking at home can be healthy, cost-effective, quick, and puts them in control. Keys to cooking healthy recipes will include looking at your recipe, assessing your equipment needs, planning ahead, making it simple, choosing cost-effective seasonal ingredients, and limiting the use of added fats, salts, and sugars.  Cooking - Breakfast and  Snacks  Clinical staff conducted group or individual video education with verbal and written material and guidebook.  Patient learns how important breakfast is to satiety and nutrition through the entire day. Recommendations include key foods to eat during breakfast to help stabilize blood sugar levels and to prevent overeating at meals later in the day. Planning ahead is also a key component.  Cooking - Educational psychologist conducted group or individual video education with verbal and written material and guidebook.  Patient learns eating strategies to improve overall health, including an approach to cook more at home. Recommendations include thinking of animal protein as a side on your plate rather than center stage and focusing instead on lower calorie dense options like vegetables, fruits, whole grains, and plant-based proteins, such as beans. Making sauces in large quantities to freeze for later and leaving the skin on your vegetables are also recommended to maximize your experience.  Cooking - Healthy Salads and  Dressing Clinical staff conducted group or individual video education with verbal and written material and guidebook.  Patient learns that vegetables, fruits, whole grains, and legumes are the foundations of the Pritikin Eating Plan. Recommendations include how to incorporate each of these in flavorful and healthy salads, and how to create homemade salad dressings. Proper handling of ingredients is also covered. Cooking - Soups and [employer] - Soups and Desserts Clinical staff conducted group or individual video education with verbal and written material and guidebook.  Patient learns that Pritikin soups and desserts make for easy, nutritious, and delicious snacks and meal components that are low in sodium, fat, sugar, and calorie density, while high in vitamins, minerals, and filling fiber. Recommendations include simple and healthy ideas for soups and desserts.   Overview     The Pritikin Solution Program Overview Clinical staff conducted group or individual video education with verbal and written material and guidebook.  Patient learns that the results of the Pritikin Program have been documented in more than 100 articles published in peer-reviewed journals, and the benefits include reducing risk factors for (and, in some cases, even reversing) high cholesterol, high blood pressure, type 2 diabetes, obesity, and more! An overview of the three key pillars of the Pritikin Program will be covered: eating well, doing regular exercise, and having a healthy mind-set.  WORKSHOPS  Exercise: Exercise Basics: Building Your Action Plan Clinical staff led group instruction and group discussion with PowerPoint presentation and patient guidebook. To enhance the learning environment the use of posters, models and videos may be added. At the conclusion of this workshop, patients will comprehend the difference between physical activity and exercise, as well as the benefits of incorporating both, into  their routine. Patients will understand the FITT (Frequency, Intensity, Time, and Type) principle and how to use it to build an exercise action plan. In addition, safety concerns and other considerations for exercise and cardiac rehab will be addressed by the presenter. The purpose of this lesson is to promote a comprehensive and effective weekly exercise routine in order to improve patients' overall level of fitness.   Managing Heart Disease: Your Path to a Healthier Heart Clinical staff led group instruction and group discussion with PowerPoint presentation and patient guidebook. To enhance the learning environment the use of posters, models and videos may be added.At the conclusion of this workshop, patients will understand the anatomy and physiology of the heart. Additionally, they will understand how Pritikin's three pillars impact the risk factors, the progression, and the  management of heart disease.  The purpose of this lesson is to provide a high-level overview of the heart, heart disease, and how the Pritikin lifestyle positively impacts risk factors.  Exercise Biomechanics Clinical staff led group instruction and group discussion with PowerPoint presentation and patient guidebook. To enhance the learning environment the use of posters, models and videos may be added. Patients will learn how the structural parts of their bodies function and how these functions impact their daily activities, movement, and exercise. Patients will learn how to promote a neutral spine, learn how to manage pain, and identify ways to improve their physical movement in order to promote healthy living. The purpose of this lesson is to expose patients to common physical limitations that impact physical activity. Participants will learn practical ways to adapt and manage aches and pains, and to minimize their effect on regular exercise. Patients will learn how to maintain good posture while sitting, walking, and  lifting.  Balance Training and Fall Prevention  Clinical staff led group instruction and group discussion with PowerPoint presentation and patient guidebook. To enhance the learning environment the use of posters, models and videos may be added. At the conclusion of this workshop, patients will understand the importance of their sensorimotor skills (vision, proprioception, and the vestibular system) in maintaining their ability to balance as they age. Patients will apply a variety of balancing exercises that are appropriate for their current level of function. Patients will understand the common causes for poor balance, possible solutions to these problems, and ways to modify their physical environment in order to minimize their fall risk. The purpose of this lesson is to teach patients about the importance of maintaining balance as they age and ways to minimize their risk of falling.  WORKSHOPS   Nutrition:  Fueling a Ship broker led group instruction and group discussion with PowerPoint presentation and patient guidebook. To enhance the learning environment the use of posters, models and videos may be added. Patients will review the foundational principles of the Pritikin Eating Plan and understand what constitutes a serving size in each of the food groups. Patients will also learn Pritikin-friendly foods that are better choices when away from home and review make-ahead meal and snack options. Calorie density will be reviewed and applied to three nutrition priorities: weight maintenance, weight loss, and weight gain. The purpose of this lesson is to reinforce (in a group setting) the key concepts around what patients are recommended to eat and how to apply these guidelines when away from home by planning and selecting Pritikin-friendly options. Patients will understand how calorie density may be adjusted for different weight management goals.  Mindful Eating  Clinical staff led  group instruction and group discussion with PowerPoint presentation and patient guidebook. To enhance the learning environment the use of posters, models and videos may be added. Patients will briefly review the concepts of the Pritikin Eating Plan and the importance of low-calorie dense foods. The concept of mindful eating will be introduced as well as the importance of paying attention to internal hunger signals. Triggers for non-hunger eating and techniques for dealing with triggers will be explored. The purpose of this lesson is to provide patients with the opportunity to review the basic principles of the Pritikin Eating Plan, discuss the value of eating mindfully and how to measure internal cues of hunger and fullness using the Hunger Scale. Patients will also discuss reasons for non-hunger eating and learn strategies to use for controlling emotional eating.  Targeting  Your Nutrition Priorities Clinical staff led group instruction and group discussion with PowerPoint presentation and patient guidebook. To enhance the learning environment the use of posters, models and videos may be added. Patients will learn how to determine their genetic susceptibility to disease by reviewing their family history. Patients will gain insight into the importance of diet as part of an overall healthy lifestyle in mitigating the impact of genetics and other environmental insults. The purpose of this lesson is to provide patients with the opportunity to assess their personal nutrition priorities by looking at their family history, their own health history and current risk factors. Patients will also be able to discuss ways of prioritizing and modifying the Pritikin Eating Plan for their highest risk areas  Menu  Clinical staff led group instruction and group discussion with PowerPoint presentation and patient guidebook. To enhance the learning environment the use of posters, models and videos may be added. Using menus  brought in from [employer], or printed from [employer], patients will apply the Pritikin dining out guidelines that were presented in the [employer] video. Patients will also be able to practice these guidelines in a variety of provided scenarios. The purpose of this lesson is to provide patients with the opportunity to practice hands-on learning of the Pritikin Dining Out guidelines with actual menus and practice scenarios.  Label Reading Clinical staff led group instruction and group discussion with PowerPoint presentation and patient guidebook. To enhance the learning environment the use of posters, models and videos may be added. Patients will review and discuss the Pritikin label reading guidelines presented in Pritikin's Label Reading Educational series video. Using fool labels brought in from local grocery stores and markets, patients will apply the label reading guidelines and determine if the packaged food meet the Pritikin guidelines. The purpose of this lesson is to provide patients with the opportunity to review, discuss, and practice hands-on learning of the Pritikin Label Reading guidelines with actual packaged food labels. Cooking School  Pritikin's [employer] are designed to teach patients ways to prepare quick, simple, and affordable recipes at home. The importance of nutrition's role in chronic disease risk reduction is reflected in its emphasis in the overall Pritikin program. By learning how to prepare essential core Pritikin Eating Plan recipes, patients will increase control over what they eat; be able to customize the flavor of foods without the use of added salt, sugar, or fat; and improve the quality of the food they consume. By learning a set of core recipes which are easily assembled, quickly prepared, and affordable, patients are more likely to prepare more healthy foods at home. These workshops focus on convenient breakfasts, simple  entres, side dishes, and desserts which can be prepared with minimal effort and are consistent with nutrition recommendations for cardiovascular risk reduction. Cooking [employer] are taught by a Armed forces logistics/support/administrative officer (RD) who has been trained by the [employer]. The chef or RD has a clear understanding of the importance of minimizing - if not completely eliminating - added fat, sugar, and sodium in recipes. Throughout the series of Cooking School Workshop sessions, patients will learn about healthy ingredients and efficient methods of cooking to build confidence in their capability to prepare    Cooking School weekly topics:  Adding Flavor- Sodium-Free  Fast and Healthy Breakfasts  Powerhouse Plant-Based Proteins  Satisfying Salads and Dressings  Simple Sides and Sauces  International Cuisine-Spotlight on the [employer]  Savory Soups  Efficiency Cooking - Meals in a Hydrologist and Snacks  Comforting Weekend Breakfasts  One-Pot Wonders   Fast [employer] Your Pritikin Plate  WORKSHOPS   Healthy Mindset (Psychosocial):  Focused Goals, Sustainable Changes Clinical staff led group instruction and group discussion with PowerPoint presentation and patient guidebook. To enhance the learning environment the use of posters, models and videos may be added. Patients will be able to apply effective goal setting strategies to establish at least one personal goal, and then take consistent, meaningful action toward that goal. They will learn to identify common barriers to achieving personal goals and develop strategies to overcome them. Patients will also gain an understanding of how our mind-set can impact our ability to achieve goals and the importance of cultivating a positive and growth-oriented mind-set. The purpose of this lesson is to provide patients with a deeper understanding of how to set and  achieve personal goals, as well as the tools and strategies needed to overcome common obstacles which may arise along the way.  From Head to Heart: The Power of a Healthy Outlook  Clinical staff led group instruction and group discussion with PowerPoint presentation and patient guidebook. To enhance the learning environment the use of posters, models and videos may be added. Patients will be able to recognize and describe the impact of emotions and mood on physical health. They will discover the importance of self-care and explore self-care practices which may work for them. Patients will also learn how to utilize the 4 C's to cultivate a healthier outlook and better manage stress and challenges. The purpose of this lesson is to demonstrate to patients how a healthy outlook is an essential part of maintaining good health, especially as they continue their cardiac rehab journey.  Healthy Sleep for a Healthy Heart Clinical staff led group instruction and group discussion with PowerPoint presentation and patient guidebook. To enhance the learning environment the use of posters, models and videos may be added. At the conclusion of this workshop, patients will be able to demonstrate knowledge of the importance of sleep to overall health, well-being, and quality of life. They will understand the symptoms of, and treatments for, common sleep disorders. Patients will also be able to identify daytime and nighttime behaviors which impact sleep, and they will be able to apply these tools to help manage sleep-related challenges. The purpose of this lesson is to provide patients with a general overview of sleep and outline the importance of quality sleep. Patients will learn about a few of the most common sleep disorders. Patients will also be introduced to the concept of "sleep hygiene," and discover ways to self-manage certain sleeping problems through simple daily behavior changes. Finally, the workshop will motivate  patients by clarifying the links between quality sleep and their goals of heart-healthy living.   Recognizing and Reducing Stress Clinical staff led group instruction and group discussion with PowerPoint presentation and patient guidebook. To enhance the learning environment the use of posters, models and videos may be added. At the conclusion of this workshop, patients will be able to understand the types of stress reactions, differentiate between acute and chronic stress, and recognize the impact that chronic stress has on their health. They will also be able to apply different coping mechanisms, such as reframing negative self-talk. Patients will have the opportunity to practice a variety of stress management techniques, such as deep abdominal breathing, progressive muscle relaxation, and/or guided imagery.  The purpose of this lesson is to educate patients on the role of stress in their lives and to provide healthy techniques for coping with it.  Learning Barriers/Preferences:  Learning Barriers/Preferences - 06/03/23 1106       Learning Barriers/Preferences   Learning Barriers Sight;Hearing    Learning Preferences Computer/Internet;Group Instruction;Individual Instruction;Pictoral;Skilled Demonstration;Video             Education Topics:  Knowledge Questionnaire Score:  Knowledge Questionnaire Score - 06/03/23 1107       Knowledge Questionnaire Score   Pre Score 19/24             Core Components/Risk Factors/Patient Goals at Admission:  Personal Goals and Risk Factors at Admission - 06/03/23 1041       Core Components/Risk Factors/Patient Goals on Admission   Lipids Yes    Intervention Provide education and support for participant on nutrition & aerobic/resistive exercise along with prescribed medications to achieve LDL 70mg , HDL >40mg .    Expected Outcomes Short Term: Participant states understanding of desired cholesterol values and is compliant with medications  prescribed. Participant is following exercise prescription and nutrition guidelines.;Long Term: Cholesterol controlled with medications as prescribed, with individualized exercise RX and with personalized nutrition plan. Value goals: LDL < 70mg , HDL > 40 mg.    Stress Yes    Intervention Offer individual and/or small group education and counseling on adjustment to heart disease, stress management and health-related lifestyle change. Teach and support self-help strategies.;Refer participants experiencing significant psychosocial distress to appropriate mental health specialists for further evaluation and treatment. When possible, include family members and significant others in education/counseling sessions.    Expected Outcomes Long Term: Emotional wellbeing is indicated by absence of clinically significant psychosocial distress or social isolation.;Short Term: Participant demonstrates changes in health-related behavior, relaxation and other stress management skills, ability to obtain effective social support, and compliance with psychotropic medications if prescribed.             Core Components/Risk Factors/Patient Goals Review:    Core Components/Risk Factors/Patient Goals at Discharge (Final Review):    ITP Comments:  ITP Comments     Row Name 06/03/23 0926           ITP Comments Armanda Magic, MD: Medical Director.  Introduction to the Pritikin Education Program/Intensive Cardiac Rehab.  Initial orientation packet reviewed with the patient.                Comments: Participant attended orientation for the cardiac rehabilitation program on  06/03/2023  to perform initial intake and exercise walk test. Patient introduced to the Pritikin Program education and orientation packet was reviewed. Completed 6-minute walk test, measurements, initial ITP, and exercise prescription. Vital signs stable. Telemetry-normal sinus rhythm, asymptomatic.   Service time was from 9:10 to 10:55.

## 2023-06-04 ENCOUNTER — Other Ambulatory Visit (HOSPITAL_BASED_OUTPATIENT_CLINIC_OR_DEPARTMENT_OTHER): Payer: Self-pay

## 2023-06-05 ENCOUNTER — Other Ambulatory Visit (HOSPITAL_BASED_OUTPATIENT_CLINIC_OR_DEPARTMENT_OTHER): Payer: Self-pay

## 2023-06-05 DIAGNOSIS — M7541 Impingement syndrome of right shoulder: Secondary | ICD-10-CM | POA: Diagnosis not present

## 2023-06-05 DIAGNOSIS — R293 Abnormal posture: Secondary | ICD-10-CM | POA: Diagnosis not present

## 2023-06-05 DIAGNOSIS — M25511 Pain in right shoulder: Secondary | ICD-10-CM | POA: Diagnosis not present

## 2023-06-05 DIAGNOSIS — M25611 Stiffness of right shoulder, not elsewhere classified: Secondary | ICD-10-CM | POA: Diagnosis not present

## 2023-06-05 DIAGNOSIS — M5412 Radiculopathy, cervical region: Secondary | ICD-10-CM | POA: Diagnosis not present

## 2023-06-09 ENCOUNTER — Encounter (HOSPITAL_COMMUNITY)
Admission: RE | Admit: 2023-06-09 | Discharge: 2023-06-09 | Disposition: A | Discharge status: Home / Self Care | Payer: Medicare Other | Source: Ambulatory Visit | Attending: Cardiology | Admitting: Cardiology

## 2023-06-09 DIAGNOSIS — I214 Non-ST elevation (NSTEMI) myocardial infarction: Secondary | ICD-10-CM

## 2023-06-09 DIAGNOSIS — Z955 Presence of coronary angioplasty implant and graft: Secondary | ICD-10-CM

## 2023-06-09 NOTE — Progress Notes (Signed)
Daily Session Note  Patient Details  Name: Paul [name] MRN: [id number] Date of Birth: Mar 09, 1951 Referring Provider:   Flowsheet Row INTENSIVE CARDIAC REHAB ORIENT from 06/03/2023 in [facility]  Referring Provider Donato Schultz, MD       Encounter Date: 06/09/2023  Check In:  Session Check In - 06/09/23 4098       Check-In   Supervising physician immediately available to respond to emergencies [facility] MD immediately available    Physician(s) Bernadene Person, NP    Location [facility]    Staff Present [name], MS, ACSM-CEP, CCRP, Exercise Physiologist;Olinty Peggye Pitt, MS, ACSM-CEP, Exercise Physiologist;Jetta Dan Humphreys BS, ACSM-CEP, Exercise Physiologist;Johnny Hale Bogus, MS, Exercise Physiologist;Stephine Langbehn, RN, BSN    Virtual Visit No    Medication changes reported     No    Fall or balance concerns reported    No    Tobacco Cessation No Change    Warm-up and Cool-down Performed as group-led instruction   CRP2 oreintation today   Resistance Training Performed Yes    VAD Patient? No    PAD/SET Patient? No      Pain Assessment   Currently in Pain? No/denies    Pain Score 0-No pain    Multiple Pain Sites No             Capillary Blood Glucose: No results found for this or any previous visit (from the past 24 hour(s)).   Exercise Prescription Changes - 06/09/23 1600       Response to Exercise   Blood Pressure (Admit) 112/68    Blood Pressure (Exercise) 150/80    Blood Pressure (Exit) 112/70    Heart Rate (Admit) 68 bpm    Heart Rate (Exercise) 125 bpm    Heart Rate (Exit) 77 bpm    Rating of Perceived Exertion (Exercise) 9    Symptoms None    Comments Pt's firs day in the CRP2 porgram    Duration Continue with 30 min of aerobic exercise without signs/symptoms of physical distress.    Intensity THRR unchanged      Progression   Progression Continue to progress workloads to maintain  intensity without signs/symptoms of physical distress.    Average METs 4.45      Resistance Training   Training Prescription Yes    Weight 3lbs    Reps 10-15    Time 10 Minutes      Interval Training   Interval Training No      Treadmill   MPH 3.4    Grade 1    Minutes 15    METs 4.07      Bike   Level 3    Watts 74    Minutes 15    METs 4.9             Social History   Tobacco Use  Smoking Status Never  Smokeless Tobacco Never    Goals Met:  Exercise tolerated well No report of concerns or symptoms today Strength training completed today  Goals Unmet:  Not Applicable  Comments: Pt started cardiac rehab today.  Pt tolerated light exercise without difficulty. VSS, telemetry-Sinus Rhythm, asymptomatic.  Medication list reconciled. Pt denies barriers to medicaiton compliance.  PSYCHOSOCIAL ASSESSMENT:  PHQ-5. Paul [name] denies being depressed today. Pt exhibits positive coping skills, hopeful outlook with supportive family. No psychosocial needs identified at this time, no psychosocial interventions necessary.    Pt enjoys triathlons  and working with puzzles.   Pt oriented to exercise equipment and routine.    Understanding verbalized. Thayer Headings RN BSN    Dr. Armanda Magic is Medical Director for Cardiac Rehab at [facility].

## 2023-06-10 ENCOUNTER — Encounter: Payer: Self-pay | Admitting: Family Medicine

## 2023-06-10 DIAGNOSIS — M25511 Pain in right shoulder: Secondary | ICD-10-CM | POA: Diagnosis not present

## 2023-06-10 DIAGNOSIS — M7541 Impingement syndrome of right shoulder: Secondary | ICD-10-CM | POA: Diagnosis not present

## 2023-06-10 DIAGNOSIS — R293 Abnormal posture: Secondary | ICD-10-CM | POA: Diagnosis not present

## 2023-06-10 DIAGNOSIS — M5412 Radiculopathy, cervical region: Secondary | ICD-10-CM | POA: Diagnosis not present

## 2023-06-10 DIAGNOSIS — M25611 Stiffness of right shoulder, not elsewhere classified: Secondary | ICD-10-CM | POA: Diagnosis not present

## 2023-06-11 ENCOUNTER — Encounter (HOSPITAL_COMMUNITY)
Admission: RE | Admit: 2023-06-11 | Discharge: 2023-06-11 | Disposition: A | Discharge status: Home / Self Care | Payer: Medicare Other | Source: Ambulatory Visit | Attending: Cardiology | Admitting: Cardiology

## 2023-06-11 DIAGNOSIS — Z955 Presence of coronary angioplasty implant and graft: Secondary | ICD-10-CM | POA: Diagnosis not present

## 2023-06-11 DIAGNOSIS — I214 Non-ST elevation (NSTEMI) myocardial infarction: Secondary | ICD-10-CM | POA: Diagnosis not present

## 2023-06-12 DIAGNOSIS — M25611 Stiffness of right shoulder, not elsewhere classified: Secondary | ICD-10-CM | POA: Diagnosis not present

## 2023-06-12 DIAGNOSIS — R293 Abnormal posture: Secondary | ICD-10-CM | POA: Diagnosis not present

## 2023-06-12 DIAGNOSIS — M25511 Pain in right shoulder: Secondary | ICD-10-CM | POA: Diagnosis not present

## 2023-06-12 DIAGNOSIS — M5412 Radiculopathy, cervical region: Secondary | ICD-10-CM | POA: Diagnosis not present

## 2023-06-12 DIAGNOSIS — M7541 Impingement syndrome of right shoulder: Secondary | ICD-10-CM | POA: Diagnosis not present

## 2023-06-13 ENCOUNTER — Encounter (HOSPITAL_COMMUNITY)
Admission: RE | Admit: 2023-06-13 | Discharge: 2023-06-13 | Disposition: A | Discharge status: Home / Self Care | Payer: Medicare Other | Source: Ambulatory Visit | Attending: Cardiology | Admitting: Cardiology

## 2023-06-13 ENCOUNTER — Encounter: Payer: Self-pay | Admitting: Cardiology

## 2023-06-13 DIAGNOSIS — Z955 Presence of coronary angioplasty implant and graft: Secondary | ICD-10-CM | POA: Diagnosis not present

## 2023-06-13 DIAGNOSIS — I214 Non-ST elevation (NSTEMI) myocardial infarction: Secondary | ICD-10-CM | POA: Diagnosis not present

## 2023-06-16 ENCOUNTER — Encounter (HOSPITAL_COMMUNITY): Admission: RE | Admit: 2023-06-16 | Payer: Medicare Other | Source: Ambulatory Visit

## 2023-06-16 ENCOUNTER — Ambulatory Visit: Payer: Medicare Other | Admitting: Family Medicine

## 2023-06-16 DIAGNOSIS — Z955 Presence of coronary angioplasty implant and graft: Secondary | ICD-10-CM

## 2023-06-16 DIAGNOSIS — I214 Non-ST elevation (NSTEMI) myocardial infarction: Secondary | ICD-10-CM | POA: Diagnosis not present

## 2023-06-17 DIAGNOSIS — M7541 Impingement syndrome of right shoulder: Secondary | ICD-10-CM | POA: Diagnosis not present

## 2023-06-17 DIAGNOSIS — M25511 Pain in right shoulder: Secondary | ICD-10-CM | POA: Diagnosis not present

## 2023-06-17 DIAGNOSIS — R293 Abnormal posture: Secondary | ICD-10-CM | POA: Diagnosis not present

## 2023-06-17 DIAGNOSIS — M5412 Radiculopathy, cervical region: Secondary | ICD-10-CM | POA: Diagnosis not present

## 2023-06-17 DIAGNOSIS — M25611 Stiffness of right shoulder, not elsewhere classified: Secondary | ICD-10-CM | POA: Diagnosis not present

## 2023-06-18 ENCOUNTER — Encounter (HOSPITAL_COMMUNITY)
Admission: RE | Admit: 2023-06-18 | Discharge: 2023-06-18 | Disposition: A | Discharge status: Home / Self Care | Payer: Medicare Other | Source: Ambulatory Visit | Attending: Cardiology | Admitting: Cardiology

## 2023-06-18 DIAGNOSIS — Z955 Presence of coronary angioplasty implant and graft: Secondary | ICD-10-CM | POA: Diagnosis not present

## 2023-06-18 DIAGNOSIS — I214 Non-ST elevation (NSTEMI) myocardial infarction: Secondary | ICD-10-CM

## 2023-06-18 NOTE — Progress Notes (Signed)
Cardiac Individual Treatment Plan  Patient Details  Name: Paul [name] MRN: [id number] Date of Birth: July 17, 1951 Referring Provider:   Flowsheet Row INTENSIVE CARDIAC REHAB ORIENT from 06/03/2023 in [facility] for Heart, Vascular, & Lung Health  Referring Provider Donato Schultz, MD       Initial Encounter Date:  Flowsheet Row INTENSIVE CARDIAC REHAB ORIENT from 06/03/2023 in [facility] for Heart, Vascular, & Lung Health  Date 06/03/23       Visit Diagnosis: 05/10/23 NSTEMI (non-ST elevated myocardial infarction) (HCC)  05/12/23 Status post coronary artery stent placement  Patient's Home Medications on Admission:  Current Outpatient Medications:    aspirin EC 81 MG tablet, Take 1 tablet (81 mg total) by mouth daily. Swallow whole., Disp: 120 tablet, Rfl: 2   DULoxetine (CYMBALTA) 20 MG capsule, TAKE 2 CAPSULES BY MOUTH DAILY, Disp: 60 capsule, Rfl: 3   gabapentin (NEURONTIN) 100 MG capsule, Take 2 capsules (200 mg total) by mouth at bedtime., Disp: 60 capsule, Rfl: 3   isosorbide mononitrate (IMDUR) 30 MG 24 hr tablet, Take 1 tablet (30 mg total) by mouth daily., Disp: 90 tablet, Rfl: 3   nitroGLYCERIN (NITROSTAT) 0.4 MG SL tablet, Place 1 tablet (0.4 mg total) under the tongue every 5 (five) minutes x 3 doses as needed for chest pain., Disp: 25 tablet, Rfl: 2   prasugrel (EFFIENT) 10 MG TABS tablet, Take 6 tablets by mouth 24 hours after last Brilinta dose. Then 24 hours later take 1 tablet (10mg ) by mouth daily., Disp: 90 tablet, Rfl: 3   rosuvastatin (CRESTOR) 20 MG tablet, Take 1 tablet (20 mg total) by mouth daily., Disp: 90 tablet, Rfl: 3   sildenafil (VIAGRA) 100 MG tablet, Take 100 mg by mouth daily as needed for erectile dysfunction., Disp: , Rfl:    ticagrelor (BRILINTA) 90 MG TABS tablet, Take by mouth 2 (two) times daily., Disp: , Rfl:    Vitamin D, Ergocalciferol, (DRISDOL) 1.25 MG (50000 UNIT) CAPS capsule, TAKE 1 CAPSULE  (50,000 UNITS TOTAL) BY MOUTH EVERY 7 (SEVEN) DAYS (Patient taking differently: Take 50,000 Units by mouth every Wednesday.), Disp: 12 capsule, Rfl: 0  Past Medical History: Past Medical History:  Diagnosis Date   Cataract    Depression    RESOLVED   Hyperlipidemia    Spinal stenosis    WAS HAVING NECK PAIN -PT STATES NERVE ABLATION PROCEDURE 2012 AND PAIN HAS RESOLVED   Spondylitis (HCC)     Tobacco Use: Social History   Tobacco Use  Smoking Status Never  Smokeless Tobacco Never    Labs: Review Flowsheet       Latest Ref Rng & Units 04/18/2018 05/26/2019 05/11/2023 05/26/2023  Labs for ITP Cardiac and Pulmonary Rehab  Cholestrol 100 - 199 mg/dL - 562  - 130   LDL (calc) 0 - 99 mg/dL - 58  - 75   HDL-C >86 mg/dL - 80  - 46   Trlycerides 0 - 149 mg/dL - 96  - 578   Hemoglobin A1c 4.8 - 5.6 % - - 5.1  -  TCO2 22 - 32 mmol/L 29  - - -    Details            Capillary Blood Glucose: No results found for: "GLUCAP"   Exercise Target Goals: Exercise Program Goal: Individual exercise prescription set using results from initial 6 min walk test and THRR while considering  patient's activity barriers and safety.   Exercise Prescription Goal:  Initial exercise prescription builds to 30-45 minutes a day of aerobic activity, 2-3 days per week.  Home exercise guidelines will be given to patient during program as part of exercise prescription that the participant will acknowledge.  Activity Barriers & Risk Stratification:  Activity Barriers & Cardiac Risk Stratification - 06/03/23 1109       Activity Barriers & Cardiac Risk Stratification   Activity Barriers Arthritis;Joint Problems;Neck/Spine Problems;Chest Pain/Angina    Cardiac Risk Stratification Moderate             6 Minute Walk:  6 Minute Walk     Row Name 06/03/23 1108         6 Minute Walk   Phase Initial     Distance 1981 feet     Walk Time 6 minutes     # of Rest Breaks 0     MPH 3.8     METS 4.14      RPE 11     Perceived Dyspnea  0     VO2 Peak 14.5     Symptoms No     Resting HR 76 bpm     Resting BP 114/72     Resting Oxygen Saturation  96 %     Exercise Oxygen Saturation  during 6 min walk 96 %     Max Ex. HR 99 bpm     Max Ex. BP 120/70     2 Minute Post BP 112/68              Oxygen Initial Assessment:   Oxygen Re-Evaluation:   Oxygen Discharge (Final Oxygen Re-Evaluation):   Initial Exercise Prescription:  Initial Exercise Prescription - 06/03/23 1100       Date of Initial Exercise RX and Referring Provider   Date 06/03/23    Referring Provider Donato Schultz, MD    Expected Discharge Date 08/20/23      Treadmill   MPH 3.4    Grade 1    Minutes 15    METs 4.07      Bike   Level 3    Watts 50    Minutes 15    METs 4.1      Prescription Details   Frequency (times per week) 3    Duration Progress to 30 minutes of continuous aerobic without signs/symptoms of physical distress      Intensity   THRR 40-80% of Max Heartrate 59-118    Ratings of Perceived Exertion 11-13    Perceived Dyspnea 0-4      Progression   Progression Continue progressive overload as per policy without signs/symptoms or physical distress.      Resistance Training   Training Prescription Yes    Weight 3lbs    Reps 10-15             Perform Capillary Blood Glucose checks as needed.  Exercise Prescription Changes:   Exercise Prescription Changes     Row Name 06/09/23 1600             Response to Exercise   Blood Pressure (Admit) 112/68       Blood Pressure (Exercise) 150/80       Blood Pressure (Exit) 112/70       Heart Rate (Admit) 68 bpm       Heart Rate (Exercise) 125 bpm       Heart Rate (Exit) 77 bpm       Rating of Perceived Exertion (Exercise) 9  Symptoms None       Comments Pt's firs day in the CRP2 porgram       Duration Continue with 30 min of aerobic exercise without signs/symptoms of physical distress.       Intensity THRR unchanged          Progression   Progression Continue to progress workloads to maintain intensity without signs/symptoms of physical distress.       Average METs 4.45         Resistance Training   Training Prescription Yes       Weight 3lbs       Reps 10-15       Time 10 Minutes         Interval Training   Interval Training No         Treadmill   MPH 3.4       Grade 1       Minutes 15       METs 4.07         Bike   Level 3       Watts 74       Minutes 15       METs 4.9                Exercise Comments:   Exercise Comments     Row Name 06/09/23 1642           Exercise Comments Pt's first day in the CRP2 program. Pt exercised without comlaints and is off to a good start.                Exercise Goals and Review:   Exercise Goals     Row Name 06/03/23 1109             Exercise Goals   Increase Physical Activity Yes       Intervention Provide advice, education, support and counseling about physical activity/exercise needs.;Develop an individualized exercise prescription for aerobic and resistive training based on initial evaluation findings, risk stratification, comorbidities and participant's personal goals.       Expected Outcomes Short Term: Attend rehab on a regular basis to increase amount of physical activity.;Long Term: Add in home exercise to make exercise part of routine and to increase amount of physical activity.;Long Term: Exercising regularly at least 3-5 days a week.       Increase Strength and Stamina Yes       Intervention Provide advice, education, support and counseling about physical activity/exercise needs.;Develop an individualized exercise prescription for aerobic and resistive training based on initial evaluation findings, risk stratification, comorbidities and participant's personal goals.       Expected Outcomes Short Term: Increase workloads from initial exercise prescription for resistance, speed, and METs.;Short Term: Perform resistance  training exercises routinely during rehab and add in resistance training at home;Long Term: Improve cardiorespiratory fitness, muscular endurance and strength as measured by increased METs and functional capacity ( )       Able to understand and use rate of perceived exertion (RPE) scale Yes       Intervention Provide education and explanation on how to use RPE scale       Expected Outcomes Short Term: Able to use RPE daily in rehab to express subjective intensity level;Long Term:  Able to use RPE to guide intensity level when exercising independently       Knowledge and understanding of Target Heart Rate Range (THRR) Yes       Intervention  Provide education and explanation of THRR including how the numbers were predicted and where they are located for reference       Expected Outcomes Short Term: Able to state/look up THRR;Long Term: Able to use THRR to govern intensity when exercising independently;Short Term: Able to use daily as guideline for intensity in rehab       Understanding of Exercise Prescription Yes       Intervention Provide education, explanation, and written materials on patient's individual exercise prescription       Expected Outcomes Short Term: Able to explain program exercise prescription;Long Term: Able to explain home exercise prescription to exercise independently                Exercise Goals Re-Evaluation :  Exercise Goals Re-Evaluation     Row Name 06/09/23 1641             Exercise Goal Re-Evaluation   Exercise Goals Review Increase Physical Activity;Able to understand and use Dyspnea scale;Increase Strength and Stamina;Knowledge and understanding of Target Heart Rate Range (THRR);Able to understand and use rate of perceived exertion (RPE) scale       Comments Pt's first day in the CRP2 program. Pt understnads the exercise Rx, THRR and RPE scale.       Expected Outcomes Will continue to monitor and progress exercise workloads as toelrated.                 Discharge Exercise Prescription (Final Exercise Prescription Changes):  Exercise Prescription Changes - 06/09/23 1600       Response to Exercise   Blood Pressure (Admit) 112/68    Blood Pressure (Exercise) 150/80    Blood Pressure (Exit) 112/70    Heart Rate (Admit) 68 bpm    Heart Rate (Exercise) 125 bpm    Heart Rate (Exit) 77 bpm    Rating of Perceived Exertion (Exercise) 9    Symptoms None    Comments Pt's firs day in the CRP2 porgram    Duration Continue with 30 min of aerobic exercise without signs/symptoms of physical distress.    Intensity THRR unchanged      Progression   Progression Continue to progress workloads to maintain intensity without signs/symptoms of physical distress.    Average METs 4.45      Resistance Training   Training Prescription Yes    Weight 3lbs    Reps 10-15    Time 10 Minutes      Interval Training   Interval Training No      Treadmill   MPH 3.4    Grade 1    Minutes 15    METs 4.07      Bike   Level 3    Watts 74    Minutes 15    METs 4.9             Nutrition:  Target Goals: Understanding of nutrition guidelines, daily intake of sodium 1500mg , cholesterol 200mg , calories 30% from fat and 7% or less from saturated fats, daily to have 5 or more servings of fruits and vegetables.  Biometrics:  Pre Biometrics - 06/03/23 1110       Pre Biometrics   Waist Circumference 37.5 inches    Hip Circumference 37.75 inches    Waist to Hip Ratio 0.99 %    Triceps Skinfold 7 mm    % Body Fat 21.9 %    Grip Strength 36 kg    Flexibility 14.25 in    Single Leg  Stand 30 seconds              Nutrition Therapy Plan and Nutrition Goals:  Nutrition Therapy & Goals - 06/09/23 0929       Nutrition Therapy   Diet Heart Healthy Diet    Drug/Food Interactions Statins/Certain Fruits      Personal Nutrition Goals   Nutrition Goal Patient to identify strategies for reducing cardiovascular risk by attending the Pritikin  education and nutrition series weekly.    Personal Goal #2 Patient to improve diet quality by using the plate method as a guide for meal planning to include lean protein/plant protein, fruits, vegetables, whole grains, nonfat dairy as part of a well-balanced diet.    Comments Paul [name] is motivated to get back to training for half iron man races. He reports that he has previously followed the Pritikin eating plan in the !990s and is motivated to get back to the Pritikin eating plan. He does report that his race day weight is ~152#. Patient will benefit from participation in intensive cardiac rehab for nutrition, exercise, and lifestyle modification.      Intervention Plan   Intervention Prescribe, educate and counsel regarding individualized specific dietary modifications aiming towards targeted core components such as weight, hypertension, lipid management, diabetes, heart failure and other comorbidities.;Nutrition handout(s) given to patient.    Expected Outcomes Short Term Goal: Understand basic principles of dietary content, such as calories, fat, sodium, cholesterol and nutrients.;Long Term Goal: Adherence to prescribed nutrition plan.             Nutrition Assessments:  Nutrition Assessments - 06/10/23 0927       Rate Your Plate Scores   Pre Score 63            MEDIFICTS Score Key: ?70 Need to make dietary changes  40-70 Heart Healthy Diet ? 40 Therapeutic Level Cholesterol Diet   Flowsheet Row INTENSIVE CARDIAC REHAB from 06/09/2023 in [facility] for Heart, Vascular, & Lung Health  Picture Your Plate Total Score on Admission 63      Picture Your Plate Scores: <81 Unhealthy dietary pattern with much room for improvement. 41-50 Dietary pattern unlikely to meet recommendations for good health and room for improvement. 51-60 More healthful dietary pattern, with some room for improvement.  >60 Healthy dietary pattern, although there may be some specific  behaviors that could be improved.    Nutrition Goals Re-Evaluation:  Nutrition Goals Re-Evaluation     Row Name 06/09/23 0929             Goals   Current Weight 170 lb 13.7 oz (77.5 kg)       Comment lipoproteinA WNL, LDL 75, A1c WNL       Expected Outcome Paul [name] is motivated to get back to training for half iron man races. He reports that he has previously followed the Pritikin eating plan in the !990s and is motivated to get back to the Pritikin eating plan. He does report that his race day weight is ~152#. Patient will benefit from participation in intensive cardiac rehab for nutrition, exercise, and lifestyle modification.                Nutrition Goals Re-Evaluation:  Nutrition Goals Re-Evaluation     Row Name 06/09/23 0929             Goals   Current Weight 170 lb 13.7 oz (77.5 kg)       Comment lipoproteinA WNL, LDL 75,  A1c WNL       Expected Outcome Paul [name] is motivated to get back to training for half iron man races. He reports that he has previously followed the Pritikin eating plan in the !990s and is motivated to get back to the Pritikin eating plan. He does report that his race day weight is ~152#. Patient will benefit from participation in intensive cardiac rehab for nutrition, exercise, and lifestyle modification.                Nutrition Goals Discharge (Final Nutrition Goals Re-Evaluation):  Nutrition Goals Re-Evaluation - 06/09/23 0929       Goals   Current Weight 170 lb 13.7 oz (77.5 kg)    Comment lipoproteinA WNL, LDL 75, A1c WNL    Expected Outcome Paul [name] is motivated to get back to training for half iron man races. He reports that he has previously followed the Pritikin eating plan in the !990s and is motivated to get back to the Pritikin eating plan. He does report that his race day weight is ~152#. Patient will benefit from participation in intensive cardiac rehab for nutrition, exercise, and lifestyle modification.              Psychosocial: Target Goals: Acknowledge presence or absence of significant depression and/or stress, maximize coping skills, provide positive support system. Participant is able to verbalize types and ability to use techniques and skills needed for reducing stress and depression.  Initial Review & Psychosocial Screening:  Initial Psych Review & Screening - 06/03/23 1040       Initial Review   Current issues with Current Stress Concerns    Comments Pt has just recently moved into a new home      Family Dynamics   Good Support System? Yes   Has spouse, daughter and dogs     Barriers   Psychosocial barriers to participate in program The patient should benefit from training in stress management and relaxation.      Screening Interventions   Interventions Encouraged to exercise             Quality of Life Scores:  Quality of Life - 06/03/23 1105       Quality of Life   Select Quality of Life      Quality of Life Scores   Health/Function Pre 24.83 %    Socioeconomic Pre 25.6 %    Psych/Spiritual Pre 25.75 %    Family Pre 26.4 %    GLOBAL Pre 25.39 %            Scores of 19 and below usually indicate a poorer quality of life in these areas.  A difference of  2-3 points is a clinically meaningful difference.  A difference of 2-3 points in the total score of the Quality of Life Index has been associated with significant improvement in overall quality of life, self-image, physical symptoms, and general health in studies assessing change in quality of life.  PHQ-9: Review Flowsheet  More data may exist      06/03/2023 10/16/2015 09/12/2015 07/18/2015 07/07/2015  Depression screen PHQ 2/9  Decreased Interest 1 0 0 0 0 0  Down, Depressed, Hopeless 1 0 0 0 0 0  PHQ - 2 Score 2 0 0 0 0 0  Altered sleeping 0 - - - -  Tired, decreased energy 1 - - - -  Change in appetite 1 - - - -  Feeling bad or failure about yourself  0 - - - -  Trouble concentrating 0 - - - -  Moving  slowly or fidgety/restless 0 - - - -  Suicidal thoughts 0 - - - -  PHQ-9 Score 4 - - - -  Difficult doing work/chores Not difficult at all - - - -    Details       Multiple values from one day are sorted in reverse-chronological order        Interpretation of Total Score  Total Score Depression Severity:  1-4 = Minimal depression, 5-9 = Mild depression, 10-14 = Moderate depression, 15-19 = Moderately severe depression, 20-27 = Severe depression   Psychosocial Evaluation and Intervention:   Psychosocial Re-Evaluation:  Psychosocial Re-Evaluation     Row Name 06/09/23 0917 06/18/23 0831           Psychosocial Re-Evaluation   Current issues with None Identified None Identified      Comments Paul [name] denies stress or concerns on his first day of exercise. --      Expected Outcomes Paul [name] will have decreased concerns or stressors on his first day of exercise --      Interventions Encouraged to attend Cardiac Rehabilitation for the exercise;Stress management education Encouraged to attend Cardiac Rehabilitation for the exercise      Continue Psychosocial Services  No Follow up required No Follow up required               Psychosocial Discharge (Final Psychosocial Re-Evaluation):  Psychosocial Re-Evaluation - 06/18/23 0831       Psychosocial Re-Evaluation   Current issues with None Identified    Interventions Encouraged to attend Cardiac Rehabilitation for the exercise    Continue Psychosocial Services  No Follow up required             Vocational Rehabilitation: Provide vocational rehab assistance to qualifying candidates.   Vocational Rehab Evaluation & Intervention:  Vocational Rehab - 06/03/23 1041       Initial Vocational Rehab Evaluation & Intervention   Assessment shows need for Vocational Rehabilitation No   Pt is retitred, no needs            Education: Education Goals: Education classes will be provided on a weekly basis, covering required  topics. Participant will state understanding/return demonstration of topics presented.    Education     Row Name 06/09/23 0900     Education   Cardiac Education Topics Pritikin   Select Core Videos     Core Videos   Educator Exercise Physiologist   Select Nutrition   Nutrition Facts on Fat   Instruction Review Code 1- Verbalizes Understanding   Class Start Time [phone]   Class Stop Time 0848   Class Time Calculation (min) 32 min    Row Name 06/11/23 1000     Education   Cardiac Education Topics Pritikin   Secondary school teacher School   Educator Dietitian   Weekly Topic Fast Evening Meals   Instruction Review Code 1- Verbalizes Understanding   Class Start Time [phone]   Class Stop Time 0845   Class Time Calculation (min) 33 min    Row Name 06/13/23 0900     Education   Cardiac Education Topics Pritikin   Select Core Videos     Core Videos   Educator Dietitian   Select Nutrition   Nutrition Vitamins and Minerals   Instruction Review Code 1- Verbalizes Understanding   Class Start Time 0818   Class Stop Time 0857   Class Time  Calculation (min) 39 min    Row Name 06/16/23 1100     Education   Cardiac Education Topics Pritikin   Geographical information systems officer Psychosocial   Psychosocial Workshop Healthy Sleep for a Healthy Heart   Instruction Review Code 1- Verbalizes Understanding   Class Start Time [phone]   Class Stop Time 0900   Class Time Calculation (min) 50 min            Core Videos: Exercise    Move It!  Clinical staff conducted group or individual video education with verbal and written material and guidebook.  Patient learns the recommended Pritikin exercise program. Exercise with the goal of living a long, healthy life. Some of the health benefits of exercise include controlled diabetes, healthier blood pressure levels, improved cholesterol levels, improved heart and lung capacity, improved  sleep, and better body composition. Everyone should speak with their doctor before starting or changing an exercise routine.  Biomechanical Limitations Clinical staff conducted group or individual video education with verbal and written material and guidebook.  Patient learns how biomechanical limitations can impact exercise and how we can mitigate and possibly overcome limitations to have an impactful and balanced exercise routine.  Body Composition Clinical staff conducted group or individual video education with verbal and written material and guidebook.  Patient learns that body composition (ratio of muscle mass to fat mass) is a key component to assessing overall fitness, rather than body weight alone. Increased fat mass, especially visceral belly fat, can put [place] at increased risk for metabolic syndrome, type 2 diabetes, heart disease, and even death. It is recommended to combine diet and exercise (cardiovascular and resistance training) to improve your body composition. Seek guidance from your physician and exercise physiologist before implementing an exercise routine.  Exercise Action Plan Clinical staff conducted group or individual video education with verbal and written material and guidebook.  Patient learns the recommended strategies to achieve and enjoy long-term exercise adherence, including variety, self-motivation, self-efficacy, and positive decision making. Benefits of exercise include fitness, good health, weight management, more energy, better sleep, less stress, and overall well-being.  Medical   Heart Disease Risk Reduction Clinical staff conducted group or individual video education with verbal and written material and guidebook.  Patient learns our heart is our most vital organ as it circulates oxygen, nutrients, white blood cells, and hormones throughout the entire body, and carries waste away. Data supports a plant-based eating plan like the Pritikin Program for its  effectiveness in slowing progression of and reversing heart disease. The video provides a number of recommendations to address heart disease.   Metabolic Syndrome and Belly Fat  Clinical staff conducted group or individual video education with verbal and written material and guidebook.  Patient learns what metabolic syndrome is, how it leads to heart disease, and how one can reverse it and keep it from coming back. You have metabolic syndrome if you have 3 of the following 5 criteria: abdominal obesity, high blood pressure, high triglycerides, low HDL cholesterol, and high blood sugar.  Hypertension and Heart Disease Clinical staff conducted group or individual video education with verbal and written material and guidebook.  Patient learns that high blood pressure, or hypertension, is very common in the [place]. Hypertension is largely due to excessive salt intake, but other important risk factors include being overweight, physical inactivity, drinking too much alcohol, smoking, and not eating enough potassium from fruits and vegetables.  High blood pressure is a leading risk factor for heart attack, stroke, congestive heart failure, dementia, kidney failure, and premature death. Long-term effects of excessive salt intake include stiffening of the arteries and thickening of heart muscle and organ damage. Recommendations include ways to reduce hypertension and the risk of heart disease.  Diseases of Our Time - Focusing on Diabetes Clinical staff conducted group or individual video education with verbal and written material and guidebook.  Patient learns why the best way to stop diseases of our time is prevention, through food and other lifestyle changes. Medicine (such as prescription pills and surgeries) is often only a Band-Aid on the problem, not a long-term solution. Most common diseases of our time include obesity, type 2 diabetes, hypertension, heart disease, and cancer. The Pritikin Program is  recommended and has been proven to help reduce, reverse, and/or prevent the damaging effects of metabolic syndrome.  Nutrition   Overview of the Pritikin Eating Plan  Clinical staff conducted group or individual video education with verbal and written material and guidebook.  Patient learns about the Pritikin Eating Plan for disease risk reduction. The Pritikin Eating Plan emphasizes a wide variety of unrefined, minimally-processed carbohydrates, like fruits, vegetables, whole grains, and legumes. Go, Caution, and Stop food choices are explained. Plant-based and lean animal proteins are emphasized. Rationale provided for low sodium intake for blood pressure control, low added sugars for blood sugar stabilization, and low added fats and oils for coronary artery disease risk reduction and weight management.  Calorie Density  Clinical staff conducted group or individual video education with verbal and written material and guidebook.  Patient learns about calorie density and how it impacts the Pritikin Eating Plan. Knowing the characteristics of the food you choose will help you decide whether those foods will lead to weight gain or weight loss, and whether you want to consume more or less of them. Weight loss is usually a side effect of the Pritikin Eating Plan because of its focus on low calorie-dense foods.  Label Reading  Clinical staff conducted group or individual video education with verbal and written material and guidebook.  Patient learns about the Pritikin recommended label reading guidelines and corresponding recommendations regarding calorie density, added sugars, sodium content, and whole grains.  Dining Out - Part 1  Clinical staff conducted group or individual video education with verbal and written material and guidebook.  Patient learns that restaurant meals can be sabotaging because they can be so high in calories, fat, sodium, and/or sugar. Patient learns recommended strategies on  how to positively address this and avoid unhealthy pitfalls.  Facts on Fats  Clinical staff conducted group or individual video education with verbal and written material and guidebook.  Patient learns that lifestyle modifications can be just as effective, if not more so, as many medications for lowering your risk of heart disease. A Pritikin lifestyle can help to reduce your risk of inflammation and atherosclerosis (cholesterol build-up, or plaque, in the artery walls). Lifestyle interventions such as dietary choices and physical activity address the cause of atherosclerosis. A review of the types of fats and their impact on blood cholesterol levels, along with dietary recommendations to reduce fat intake is also included.  Nutrition Action Plan  Clinical staff conducted group or individual video education with verbal and written material and guidebook.  Patient learns how to incorporate Pritikin recommendations into their lifestyle. Recommendations include planning and keeping personal health goals in mind as an important part of their success.  Healthy  Mind-Set    Healthy Minds, Bodies, Hearts  Clinical staff conducted group or individual video education with verbal and written material and guidebook.  Patient learns how to identify when they are stressed. Video will discuss the impact of that stress, as well as the many benefits of stress management. Patient will also be introduced to stress management techniques. The way we think, act, and feel has an impact on our hearts.  How Our Thoughts Can Heal Our Hearts  Clinical staff conducted group or individual video education with verbal and written material and guidebook.  Patient learns that negative thoughts can cause depression and anxiety. This can result in negative lifestyle behavior and serious health problems. Cognitive behavioral therapy is an effective method to help control our thoughts in order to change and improve our emotional  outlook.  Additional Videos:  Exercise    Improving Performance  Clinical staff conducted group or individual video education with verbal and written material and guidebook.  Patient learns to use a non-linear approach by alternating intensity levels and lengths of time spent exercising to help burn more calories and lose more body fat. Cardiovascular exercise helps improve heart health, metabolism, hormonal balance, blood sugar control, and recovery from fatigue. Resistance training improves strength, endurance, balance, coordination, reaction time, metabolism, and muscle mass. Flexibility exercise improves circulation, posture, and balance. Seek guidance from your physician and exercise physiologist before implementing an exercise routine and learn your capabilities and proper form for all exercise.  Introduction to Yoga  Clinical staff conducted group or individual video education with verbal and written material and guidebook.  Patient learns about yoga, a discipline of the coming together of mind, breath, and body. The benefits of yoga include improved flexibility, improved range of motion, better posture and core strength, increased lung function, weight loss, and positive self-image. Yoga's heart health benefits include lowered blood pressure, healthier heart rate, decreased cholesterol and triglyceride levels, improved immune function, and reduced stress. Seek guidance from your physician and exercise physiologist before implementing an exercise routine and learn your capabilities and proper form for all exercise.  Medical   Aging: Enhancing Your Quality of Life  Clinical staff conducted group or individual video education with verbal and written material and guidebook.  Patient learns key strategies and recommendations to stay in good physical health and enhance quality of life, such as prevention strategies, having an advocate, securing a Health Care Proxy and Power of Attorney, and keeping  a list of medications and system for tracking them. It also discusses how to avoid risk for bone loss.  Biology of Weight Control  Clinical staff conducted group or individual video education with verbal and written material and guidebook.  Patient learns that weight gain occurs because we consume more calories than we burn (eating more, moving less). Even if your body weight is normal, you may have higher ratios of fat compared to muscle mass. Too much body fat puts you at increased risk for cardiovascular disease, heart attack, stroke, type 2 diabetes, and obesity-related cancers. In addition to exercise, following the Pritikin Eating Plan can help reduce your risk.  Decoding Lab Results  Clinical staff conducted group or individual video education with verbal and written material and guidebook.  Patient learns that lab test reflects one measurement whose values change over time and are influenced by many factors, including medication, stress, sleep, exercise, food, hydration, pre-existing medical conditions, and more. It is recommended to use the knowledge from this video to become more involved  with your lab results and evaluate your numbers to speak with your doctor.   Diseases of Our Time - Overview  Clinical staff conducted group or individual video education with verbal and written material and guidebook.  Patient learns that according to the CDC, 50% to 70% of chronic diseases (such as obesity, type 2 diabetes, elevated lipids, hypertension, and heart disease) are avoidable through lifestyle improvements including healthier food choices, listening to satiety cues, and increased physical activity.  Sleep Disorders Clinical staff conducted group or individual video education with verbal and written material and guidebook.  Patient learns how good quality and duration of sleep are important to overall health and well-being. Patient also learns about sleep disorders and how they impact health  along with recommendations to address them, including discussing with a physician.  Nutrition  Dining Out - Part 2 Clinical staff conducted group or individual video education with verbal and written material and guidebook.  Patient learns how to plan ahead and communicate in order to maximize their dining experience in a healthy and nutritious manner. Included are recommended food choices based on the type of restaurant the patient is visiting.   Fueling a Banker conducted group or individual video education with verbal and written material and guidebook.  There is a strong connection between our food choices and our health. Diseases like obesity and type 2 diabetes are very prevalent and are in large-part due to lifestyle choices. The Pritikin Eating Plan provides plenty of food and hunger-curbing satisfaction. It is easy to follow, affordable, and helps reduce health risks.  Menu Workshop  Clinical staff conducted group or individual video education with verbal and written material and guidebook.  Patient learns that restaurant meals can sabotage health goals because they are often packed with calories, fat, sodium, and sugar. Recommendations include strategies to plan ahead and to communicate with the manager, chef, or server to help order a healthier meal.  Planning Your Eating Strategy  Clinical staff conducted group or individual video education with verbal and written material and guidebook.  Patient learns about the Pritikin Eating Plan and its benefit of reducing the risk of disease. The Pritikin Eating Plan does not focus on calories. Instead, it emphasizes high-quality, nutrient-rich foods. By knowing the characteristics of the foods, we choose, we can determine their calorie density and make informed decisions.  Targeting Your Nutrition Priorities  Clinical staff conducted group or individual video education with verbal and written material and guidebook.   Patient learns that lifestyle habits have a tremendous impact on disease risk and progression. This video provides eating and physical activity recommendations based on your personal health goals, such as reducing LDL cholesterol, losing weight, preventing or controlling type 2 diabetes, and reducing high blood pressure.  Vitamins and Minerals  Clinical staff conducted group or individual video education with verbal and written material and guidebook.  Patient learns different ways to obtain key vitamins and minerals, including through a recommended healthy diet. It is important to discuss all supplements you take with your doctor.   Healthy Mind-Set    Smoking Cessation  Clinical staff conducted group or individual video education with verbal and written material and guidebook.  Patient learns that cigarette smoking and tobacco addiction pose a serious health risk which affects millions of people. Stopping smoking will significantly reduce the risk of heart disease, lung disease, and many forms of cancer. Recommended strategies for quitting are covered, including working with your doctor to develop a successful  plan.  Culinary   Becoming a Pritikin Chef  Clinical staff conducted group or individual video education with verbal and written material and guidebook.  Patient learns that cooking at home can be healthy, cost-effective, quick, and puts them in control. Keys to cooking healthy recipes will include looking at your recipe, assessing your equipment needs, planning ahead, making it simple, choosing cost-effective seasonal ingredients, and limiting the use of added fats, salts, and sugars.  Cooking - Breakfast and Snacks  Clinical staff conducted group or individual video education with verbal and written material and guidebook.  Patient learns how important breakfast is to satiety and nutrition through the entire day. Recommendations include key foods to eat during breakfast to help  stabilize blood sugar levels and to prevent overeating at meals later in the day. Planning ahead is also a key component.  Cooking - Educational psychologist conducted group or individual video education with verbal and written material and guidebook.  Patient learns eating strategies to improve overall health, including an approach to cook more at home. Recommendations include thinking of animal protein as a side on your plate rather than center stage and focusing instead on lower calorie dense options like vegetables, fruits, whole grains, and plant-based proteins, such as beans. Making sauces in large quantities to freeze for later and leaving the skin on your vegetables are also recommended to maximize your experience.  Cooking - Healthy Salads and Dressing Clinical staff conducted group or individual video education with verbal and written material and guidebook.  Patient learns that vegetables, fruits, whole grains, and legumes are the foundations of the Pritikin Eating Plan. Recommendations include how to incorporate each of these in flavorful and healthy salads, and how to create homemade salad dressings. Proper handling of ingredients is also covered. Cooking - Soups and [employer] - Soups and Desserts Clinical staff conducted group or individual video education with verbal and written material and guidebook.  Patient learns that Pritikin soups and desserts make for easy, nutritious, and delicious snacks and meal components that are low in sodium, fat, sugar, and calorie density, while high in vitamins, minerals, and filling fiber. Recommendations include simple and healthy ideas for soups and desserts.   Overview     The Pritikin Solution Program Overview Clinical staff conducted group or individual video education with verbal and written material and guidebook.  Patient learns that the results of the Pritikin Program have been documented in more than 100 articles published  in peer-reviewed journals, and the benefits include reducing risk factors for (and, in some cases, even reversing) high cholesterol, high blood pressure, type 2 diabetes, obesity, and more! An overview of the three key pillars of the Pritikin Program will be covered: eating well, doing regular exercise, and having a healthy mind-set.  WORKSHOPS  Exercise: Exercise Basics: Building Your Action Plan Clinical staff led group instruction and group discussion with PowerPoint presentation and patient guidebook. To enhance the learning environment the use of posters, models and videos may be added. At the conclusion of this workshop, patients will comprehend the difference between physical activity and exercise, as well as the benefits of incorporating both, into their routine. Patients will understand the FITT (Frequency, Intensity, Time, and Type) principle and how to use it to build an exercise action plan. In addition, safety concerns and other considerations for exercise and cardiac rehab will be addressed by the presenter. The purpose of this lesson is to promote a comprehensive and effective weekly exercise  routine in order to improve patients' overall level of fitness.   Managing Heart Disease: Your Path to a Healthier Heart Clinical staff led group instruction and group discussion with PowerPoint presentation and patient guidebook. To enhance the learning environment the use of posters, models and videos may be added.At the conclusion of this workshop, patients will understand the anatomy and physiology of the heart. Additionally, they will understand how Pritikin's three pillars impact the risk factors, the progression, and the management of heart disease.  The purpose of this lesson is to provide a high-level overview of the heart, heart disease, and how the Pritikin lifestyle positively impacts risk factors.  Exercise Biomechanics Clinical staff led group instruction and group discussion  with PowerPoint presentation and patient guidebook. To enhance the learning environment the use of posters, models and videos may be added. Patients will learn how the structural parts of their bodies function and how these functions impact their daily activities, movement, and exercise. Patients will learn how to promote a neutral spine, learn how to manage pain, and identify ways to improve their physical movement in order to promote healthy living. The purpose of this lesson is to expose patients to common physical limitations that impact physical activity. Participants will learn practical ways to adapt and manage aches and pains, and to minimize their effect on regular exercise. Patients will learn how to maintain good posture while sitting, walking, and lifting.  Balance Training and Fall Prevention  Clinical staff led group instruction and group discussion with PowerPoint presentation and patient guidebook. To enhance the learning environment the use of posters, models and videos may be added. At the conclusion of this workshop, patients will understand the importance of their sensorimotor skills (vision, proprioception, and the vestibular system) in maintaining their ability to balance as they age. Patients will apply a variety of balancing exercises that are appropriate for their current level of function. Patients will understand the common causes for poor balance, possible solutions to these problems, and ways to modify their physical environment in order to minimize their fall risk. The purpose of this lesson is to teach patients about the importance of maintaining balance as they age and ways to minimize their risk of falling.  WORKSHOPS   Nutrition:  Fueling a Ship broker led group instruction and group discussion with PowerPoint presentation and patient guidebook. To enhance the learning environment the use of posters, models and videos may be added. Patients will  review the foundational principles of the Pritikin Eating Plan and understand what constitutes a serving size in each of the food groups. Patients will also learn Pritikin-friendly foods that are better choices when away from home and review make-ahead meal and snack options. Calorie density will be reviewed and applied to three nutrition priorities: weight maintenance, weight loss, and weight gain. The purpose of this lesson is to reinforce (in a group setting) the key concepts around what patients are recommended to eat and how to apply these guidelines when away from home by planning and selecting Pritikin-friendly options. Patients will understand how calorie density may be adjusted for different weight management goals.  Mindful Eating  Clinical staff led group instruction and group discussion with PowerPoint presentation and patient guidebook. To enhance the learning environment the use of posters, models and videos may be added. Patients will briefly review the concepts of the Pritikin Eating Plan and the importance of low-calorie dense foods. The concept of mindful eating will be introduced as well as the importance  of paying attention to internal hunger signals. Triggers for non-hunger eating and techniques for dealing with triggers will be explored. The purpose of this lesson is to provide patients with the opportunity to review the basic principles of the Pritikin Eating Plan, discuss the value of eating mindfully and how to measure internal cues of hunger and fullness using the Hunger Scale. Patients will also discuss reasons for non-hunger eating and learn strategies to use for controlling emotional eating.  Targeting Your Nutrition Priorities Clinical staff led group instruction and group discussion with PowerPoint presentation and patient guidebook. To enhance the learning environment the use of posters, models and videos may be added. Patients will learn how to determine their genetic  susceptibility to disease by reviewing their family history. Patients will gain insight into the importance of diet as part of an overall healthy lifestyle in mitigating the impact of genetics and other environmental insults. The purpose of this lesson is to provide patients with the opportunity to assess their personal nutrition priorities by looking at their family history, their own health history and current risk factors. Patients will also be able to discuss ways of prioritizing and modifying the Pritikin Eating Plan for their highest risk areas  Menu  Clinical staff led group instruction and group discussion with PowerPoint presentation and patient guidebook. To enhance the learning environment the use of posters, models and videos may be added. Using menus brought in from [employer], or printed from [employer], patients will apply the Pritikin dining out guidelines that were presented in the [employer] video. Patients will also be able to practice these guidelines in a variety of provided scenarios. The purpose of this lesson is to provide patients with the opportunity to practice hands-on learning of the Pritikin Dining Out guidelines with actual menus and practice scenarios.  Label Reading Clinical staff led group instruction and group discussion with PowerPoint presentation and patient guidebook. To enhance the learning environment the use of posters, models and videos may be added. Patients will review and discuss the Pritikin label reading guidelines presented in Pritikin's Label Reading Educational series video. Using fool labels brought in from local grocery stores and markets, patients will apply the label reading guidelines and determine if the packaged food meet the Pritikin guidelines. The purpose of this lesson is to provide patients with the opportunity to review, discuss, and practice hands-on learning of the Pritikin Label Reading guidelines with actual  packaged food labels. Cooking School  Pritikin's [employer] are designed to teach patients ways to prepare quick, simple, and affordable recipes at home. The importance of nutrition's role in chronic disease risk reduction is reflected in its emphasis in the overall Pritikin program. By learning how to prepare essential core Pritikin Eating Plan recipes, patients will increase control over what they eat; be able to customize the flavor of foods without the use of added salt, sugar, or fat; and improve the quality of the food they consume. By learning a set of core recipes which are easily assembled, quickly prepared, and affordable, patients are more likely to prepare more healthy foods at home. These workshops focus on convenient breakfasts, simple entres, side dishes, and desserts which can be prepared with minimal effort and are consistent with nutrition recommendations for cardiovascular risk reduction. Cooking [employer] are taught by a Armed forces logistics/support/administrative officer (RD) who has been trained by the [employer]. The chef or RD has a clear understanding of the importance of minimizing -  if not completely eliminating - added fat, sugar, and sodium in recipes. Throughout the series of Cooking School Workshop sessions, patients will learn about healthy ingredients and efficient methods of cooking to build confidence in their capability to prepare    Cooking School weekly topics:  Adding Flavor- Sodium-Free  Fast and Healthy Breakfasts  Powerhouse Plant-Based Proteins  Satisfying Salads and Dressings  Simple Sides and Sauces  International Cuisine-Spotlight on the [employer] Zones  Delicious Desserts  Savory Soups  [employer] - Meals in a Astronomer Appetizers and Snacks  Comforting Weekend Breakfasts  One-Pot Wonders   Fast Evening Meals  Landscape architect Your Pritikin Plate  WORKSHOPS   Healthy Mindset (Psychosocial):  Focused Goals,  Sustainable Changes Clinical staff led group instruction and group discussion with PowerPoint presentation and patient guidebook. To enhance the learning environment the use of posters, models and videos may be added. Patients will be able to apply effective goal setting strategies to establish at least one personal goal, and then take consistent, meaningful action toward that goal. They will learn to identify common barriers to achieving personal goals and develop strategies to overcome them. Patients will also gain an understanding of how our mind-set can impact our ability to achieve goals and the importance of cultivating a positive and growth-oriented mind-set. The purpose of this lesson is to provide patients with a deeper understanding of how to set and achieve personal goals, as well as the tools and strategies needed to overcome common obstacles which may arise along the way.  From Head to Heart: The Power of a Healthy Outlook  Clinical staff led group instruction and group discussion with PowerPoint presentation and patient guidebook. To enhance the learning environment the use of posters, models and videos may be added. Patients will be able to recognize and describe the impact of emotions and mood on physical health. They will discover the importance of self-care and explore self-care practices which may work for them. Patients will also learn how to utilize the 4 C's to cultivate a healthier outlook and better manage stress and challenges. The purpose of this lesson is to demonstrate to patients how a healthy outlook is an essential part of maintaining good health, especially as they continue their cardiac rehab journey.  Healthy Sleep for a Healthy Heart Clinical staff led group instruction and group discussion with PowerPoint presentation and patient guidebook. To enhance the learning environment the use of posters, models and videos may be added. At the conclusion of this workshop, patients  will be able to demonstrate knowledge of the importance of sleep to overall health, well-being, and quality of life. They will understand the symptoms of, and treatments for, common sleep disorders. Patients will also be able to identify daytime and nighttime behaviors which impact sleep, and they will be able to apply these tools to help manage sleep-related challenges. The purpose of this lesson is to provide patients with a general overview of sleep and outline the importance of quality sleep. Patients will learn about a few of the most common sleep disorders. Patients will also be introduced to the concept of "sleep hygiene," and discover ways to self-manage certain sleeping problems through simple daily behavior changes. Finally, the workshop will motivate patients by clarifying the links between quality sleep and their goals of heart-healthy living.   Recognizing and Reducing Stress Clinical staff led group instruction and group discussion with PowerPoint presentation and patient guidebook. To enhance the learning environment the use of posters,  models and videos may be added. At the conclusion of this workshop, patients will be able to understand the types of stress reactions, differentiate between acute and chronic stress, and recognize the impact that chronic stress has on their health. They will also be able to apply different coping mechanisms, such as reframing negative self-talk. Patients will have the opportunity to practice a variety of stress management techniques, such as deep abdominal breathing, progressive muscle relaxation, and/or guided imagery.  The purpose of this lesson is to educate patients on the role of stress in their lives and to provide healthy techniques for coping with it.  Learning Barriers/Preferences:  Learning Barriers/Preferences - 06/03/23 1106       Learning Barriers/Preferences   Learning Barriers Sight;Hearing    Learning Preferences Computer/Internet;Group  Instruction;Individual Instruction;Pictoral;Skilled Demonstration;Video             Education Topics:  Knowledge Questionnaire Score:  Knowledge Questionnaire Score - 06/03/23 1107       Knowledge Questionnaire Score   Pre Score 19/24             Core Components/Risk Factors/Patient Goals at Admission:  Personal Goals and Risk Factors at Admission - 06/03/23 1041       Core Components/Risk Factors/Patient Goals on Admission   Lipids Yes    Intervention Provide education and support for participant on nutrition & aerobic/resistive exercise along with prescribed medications to achieve LDL 70mg , HDL >40mg .    Expected Outcomes Short Term: Participant states understanding of desired cholesterol values and is compliant with medications prescribed. Participant is following exercise prescription and nutrition guidelines.;Long Term: Cholesterol controlled with medications as prescribed, with individualized exercise RX and with personalized nutrition plan. Value goals: LDL < 70mg , HDL > 40 mg.    Stress Yes    Intervention Offer individual and/or small group education and counseling on adjustment to heart disease, stress management and health-related lifestyle change. Teach and support self-help strategies.;Refer participants experiencing significant psychosocial distress to appropriate mental health specialists for further evaluation and treatment. When possible, include family members and significant others in education/counseling sessions.    Expected Outcomes Long Term: Emotional wellbeing is indicated by absence of clinically significant psychosocial distress or social isolation.;Short Term: Participant demonstrates changes in health-related behavior, relaxation and other stress management skills, ability to obtain effective social support, and compliance with psychotropic medications if prescribed.             Core Components/Risk Factors/Patient Goals Review:   Goals and Risk  Factor Review     Row Name 06/09/23 0919 06/18/23 0833           Core Components/Risk Factors/Patient Goals Review   Personal Goals Review Lipids;Stress Lipids;Stress      Review Paul [name] started cardiac rehab on 06/09/23 and did well with exercise. Vital signs were stable Paul [name] started cardiac rehab on 06/09/23 and is off to a good start to exercise.  Vital signs have been  stable      Expected Outcomes Paul [name] will continue to participate in intensive cardiac rehab for exercise, nutrition and lifestyle modifications Paul [name] will continue to participate in intensive cardiac rehab for exercise, nutrition and lifestyle modifications               Core Components/Risk Factors/Patient Goals at Discharge (Final Review):   Goals and Risk Factor Review - 06/18/23 0833       Core Components/Risk Factors/Patient Goals Review   Personal Goals Review Lipids;Stress    Review Paul [name] started cardiac rehab  on 06/09/23 and is off to a good start to exercise.  Vital signs have been  stable    Expected Outcomes Paul [name] will continue to participate in intensive cardiac rehab for exercise, nutrition and lifestyle modifications             ITP Comments:  ITP Comments     Row Name 06/03/23 0926 06/09/23 0916 06/18/23 0831       ITP Comments Armanda Magic, MD: Medical Director.  Introduction to the [facility]/Intensive Cardiac Rehab.  Initial orientation packet reviewed with the patient. 30 day ITP Review. Lowe started intensive cardiac rehab on 06/09/23 and did well with exercise. 30 day ITP Review. Gavinn started intensive cardiac rehab on 06/09/23 and is off to a good start to exercise              Comments: See ITP comments.Thayer Headings RN BSN

## 2023-06-19 ENCOUNTER — Other Ambulatory Visit: Payer: Self-pay | Admitting: Family Medicine

## 2023-06-19 ENCOUNTER — Other Ambulatory Visit (HOSPITAL_COMMUNITY): Payer: Self-pay

## 2023-06-19 ENCOUNTER — Telehealth (HOSPITAL_COMMUNITY): Payer: Self-pay

## 2023-06-19 DIAGNOSIS — R293 Abnormal posture: Secondary | ICD-10-CM | POA: Diagnosis not present

## 2023-06-19 DIAGNOSIS — M7541 Impingement syndrome of right shoulder: Secondary | ICD-10-CM | POA: Diagnosis not present

## 2023-06-19 DIAGNOSIS — M25511 Pain in right shoulder: Secondary | ICD-10-CM | POA: Diagnosis not present

## 2023-06-19 DIAGNOSIS — M5412 Radiculopathy, cervical region: Secondary | ICD-10-CM | POA: Diagnosis not present

## 2023-06-19 DIAGNOSIS — M25611 Stiffness of right shoulder, not elsewhere classified: Secondary | ICD-10-CM | POA: Diagnosis not present

## 2023-06-19 NOTE — Telephone Encounter (Signed)
-----   Message from Donato Schultz sent at 06/19/2023  6:38 AM EDT ----- Regarding: RE: Permission for Pt to jog Ok to jog ----- Message ----- From: [name] Sent: 06/18/2023  10:59 AM EDT To: Jake Bathe, MD; Cammy Copa, RN Subject: Permission for Pt to jog                       Good morning Dr. Anne Fu,  I am requesting permission for your patient, Paul [name], to begin jogging here in the [facility] program. He has completed 5 sessions with no issues. Heart rates with exercise have ranged from 116-125. Blood pressures have ranged 124-154/70-82. Please let me know if you need any additional information. Thank you for considering this request.  Best Regards,  [name] MS, ACSM-CEP, CCRP

## 2023-06-19 NOTE — Progress Notes (Unsigned)
[facility] [address] [place] [address] Phone: [phone] Subjective:   INadine Counts, am serving as a scribe for Dr. Antoine Primas.  I'm seeing this patient by the request  of:  Noberto Retort, MD  CC: Right neck pain and arm pain follow-up  [id number]  ISSAK GOLEY is a 72 y.o. male coming in with complaint of back and neck pain. OMT on 06/03/2023 unable to get an epidural secondary to patient being on a blood thinner from recent NSTEMI. Patient states same as usual. Wants to talk injuries. No new concerns.  Medications patient has been prescribed: gabapentin vit d  Taking:         Reviewed prior external information including notes and imaging from previsou exam, outside providers and external EMR if available.   As well as notes that were available from care everywhere and other healthcare systems.  Past medical history, social, surgical and family history all reviewed in electronic medical record.  No pertanent information unless stated regarding to the chief complaint.   Past Medical History:  Diagnosis Date   Cataract    Depression    RESOLVED   Hyperlipidemia    Spinal stenosis    WAS HAVING NECK PAIN -PT STATES NERVE ABLATION PROCEDURE 2012 AND PAIN HAS RESOLVED   Spondylitis (HCC)     Allergies  Allergen Reactions   Crestor [Rosuvastatin] Other (See Comments)    Myalgias  Pt currently taking 20mg  QD   Lipitor [Atorvastatin] Other (See Comments)    Liver toxicity     Review of Systems:  No headache, visual changes, nausea, vomiting, diarrhea, constipation, dizziness, abdominal pain, skin rash, fevers, chills, night sweats, weight loss, swollen lymph nodes, body aches, joint swelling, chest pain, shortness of breath, mood changes. POSITIVE muscle aches  Objective  Blood pressure 122/82, pulse 60, height 5\' 11"  (1.803 m), weight 172 lb (78 kg), SpO2 96%.   General: No apparent distress alert and  oriented x3 mood and affect normal, dressed appropriately.  HEENT: Pupils equal, extraocular movements intact  Respiratory: Patient's speak in full sentences and does not appear short of breath  Cardiovascular: No lower extremity edema, non tender, no erythema  Neck exam does have some loss of lordosis noted.  Some tenderness to palpation throughout.  Positive impingement of the shoulder noted.  After informed written and verbal consent, patient was seated on exam table. Right shoulder was prepped with alcohol swab and utilizing posterior approach, patient's right glenohumeral space was injected with 4:1  marcaine 0.5%: Kenalog 40mg /dL. Patient tolerated the procedure well without immediate complications.         Assessment and Plan:  Degenerative disc disease, cervical Significant arthritic changes noted.  Discussed with patient about his shoulder as well.  Given an injection of the shoulder to see if this will make any improvement.  With patient still being on the blood thinner is not a candidate for the epidural.  Will increase Cymbalta to 60 mg and warned of potential side effects.  Follow-up with me again in 2 months otherwise.  Right lateral epicondylitis Will monitor at this point.  Patient does think he is making some improvement.  Right shoulder pain Chronic right shoulder pain.  Given injection today, tolerated the procedure well, discussed icing regimen and home exercises.  Increase activity slowly otherwise.  Will follow-up again in 6 to 8 weeks likely still do not move secondary to cervical radiculopathy.  The above documentation has been reviewed and is accurate and complete Judi Saa, DO         Note: This dictation was prepared with Dragon dictation along with smaller phrase technology. Any transcriptional errors that result from this process are unintentional.

## 2023-06-20 ENCOUNTER — Other Ambulatory Visit (HOSPITAL_COMMUNITY): Payer: Self-pay

## 2023-06-20 ENCOUNTER — Encounter (HOSPITAL_COMMUNITY)
Admission: RE | Admit: 2023-06-20 | Discharge: 2023-06-20 | Disposition: A | Discharge status: Home / Self Care | Payer: Medicare Other | Source: Ambulatory Visit | Attending: Cardiology | Admitting: Cardiology

## 2023-06-20 DIAGNOSIS — I214 Non-ST elevation (NSTEMI) myocardial infarction: Secondary | ICD-10-CM | POA: Insufficient documentation

## 2023-06-20 DIAGNOSIS — Z955 Presence of coronary angioplasty implant and graft: Secondary | ICD-10-CM | POA: Diagnosis not present

## 2023-06-20 DIAGNOSIS — Z48812 Encounter for surgical aftercare following surgery on the circulatory system: Secondary | ICD-10-CM | POA: Insufficient documentation

## 2023-06-21 ENCOUNTER — Other Ambulatory Visit (HOSPITAL_COMMUNITY): Payer: Self-pay

## 2023-06-23 ENCOUNTER — Other Ambulatory Visit: Payer: Self-pay

## 2023-06-23 ENCOUNTER — Telehealth: Payer: Self-pay | Admitting: Family Medicine

## 2023-06-23 ENCOUNTER — Encounter (HOSPITAL_COMMUNITY): Admission: RE | Admit: 2023-06-23 | Payer: Medicare Other | Source: Ambulatory Visit

## 2023-06-23 DIAGNOSIS — I214 Non-ST elevation (NSTEMI) myocardial infarction: Secondary | ICD-10-CM

## 2023-06-23 DIAGNOSIS — Z955 Presence of coronary angioplasty implant and graft: Secondary | ICD-10-CM | POA: Diagnosis not present

## 2023-06-23 DIAGNOSIS — Z48812 Encounter for surgical aftercare following surgery on the circulatory system: Secondary | ICD-10-CM | POA: Diagnosis not present

## 2023-06-23 MED ORDER — GABAPENTIN 100 MG PO CAPS: 200.0000 mg | ORAL_CAPSULE | Freq: Two times a day (BID) | ORAL | 3 refills | Status: DC

## 2023-06-23 MED ORDER — GABAPENTIN 300 MG PO CAPS: 300.0000 mg | ORAL_CAPSULE | Freq: Two times a day (BID) | ORAL | 1 refills | Status: DC

## 2023-06-23 NOTE — Telephone Encounter (Signed)
Patient called after hours and left a message in regards to needing a refill on gabapentin (NEURONTIN) 100 MG capsule. He said that Dr Katrinka Blazing increased the dose at his last visit and he is now out.  Please advise.

## 2023-06-23 NOTE — Telephone Encounter (Signed)
Done through mychart.

## 2023-06-24 ENCOUNTER — Telehealth (HOSPITAL_COMMUNITY): Payer: Self-pay

## 2023-06-24 DIAGNOSIS — M5412 Radiculopathy, cervical region: Secondary | ICD-10-CM | POA: Diagnosis not present

## 2023-06-24 DIAGNOSIS — M25511 Pain in right shoulder: Secondary | ICD-10-CM | POA: Diagnosis not present

## 2023-06-24 DIAGNOSIS — R293 Abnormal posture: Secondary | ICD-10-CM | POA: Diagnosis not present

## 2023-06-24 DIAGNOSIS — M7541 Impingement syndrome of right shoulder: Secondary | ICD-10-CM | POA: Diagnosis not present

## 2023-06-24 DIAGNOSIS — M25611 Stiffness of right shoulder, not elsewhere classified: Secondary | ICD-10-CM | POA: Diagnosis not present

## 2023-06-24 NOTE — Telephone Encounter (Signed)
-----   Message from Donato Schultz sent at 06/24/2023  9:47 AM EDT ----- Regarding: RE: Increase in [facility] OK with me to increase 125 ----- Message ----- From: [name] Sent: 06/24/2023   7:46 AM EDT To: Jake Bathe, MD; Cammy Copa, RN Subject: Increase in THRR                               Good morning Dr. Anne Fu,  I am requesting a THRR increase for Mr. Mcferran. 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 He feels limited by the THR of 118 and rates his exercise at 9 (Very light) on the Borg scale. He voices that since he was started on the Imdur he has not experienced angina with exercise. I would like to request an increase in the THR to 85% of is MPH ( which is 125). This still may limit him but I would like to stage any increases in his THR to evaluate his response before requesting up to 90% of MPH. Thank you for considering this request. Please let me know if you require any additional information.  Best Regards,  [name] MS, ACSM-CEP, CCRP

## 2023-06-25 ENCOUNTER — Encounter (HOSPITAL_COMMUNITY): Admission: RE | Admit: 2023-06-25 | Payer: Medicare Other | Source: Ambulatory Visit

## 2023-06-25 ENCOUNTER — Encounter: Payer: Self-pay | Admitting: Family Medicine

## 2023-06-25 ENCOUNTER — Ambulatory Visit (INDEPENDENT_AMBULATORY_CARE_PROVIDER_SITE_OTHER): Payer: Medicare Other | Admitting: Family Medicine

## 2023-06-25 VITALS — BP 122/82 | HR 60 | Ht 71.0 in | Wt 172.0 lb

## 2023-06-25 DIAGNOSIS — M7711 Lateral epicondylitis, right elbow: Secondary | ICD-10-CM

## 2023-06-25 DIAGNOSIS — I214 Non-ST elevation (NSTEMI) myocardial infarction: Secondary | ICD-10-CM | POA: Diagnosis not present

## 2023-06-25 DIAGNOSIS — M25511 Pain in right shoulder: Secondary | ICD-10-CM | POA: Diagnosis not present

## 2023-06-25 DIAGNOSIS — Z955 Presence of coronary angioplasty implant and graft: Secondary | ICD-10-CM | POA: Diagnosis not present

## 2023-06-25 DIAGNOSIS — M5030 Other cervical disc degeneration, unspecified cervical region: Secondary | ICD-10-CM | POA: Diagnosis not present

## 2023-06-25 DIAGNOSIS — G8929 Other chronic pain: Secondary | ICD-10-CM | POA: Diagnosis not present

## 2023-06-25 DIAGNOSIS — Z48812 Encounter for surgical aftercare following surgery on the circulatory system: Secondary | ICD-10-CM | POA: Diagnosis not present

## 2023-06-25 MED ORDER — DULOXETINE HCL 60 MG PO CPEP: 60.0000 mg | ORAL_CAPSULE | Freq: Every day | ORAL | 3 refills | Status: DC

## 2023-06-25 NOTE — Progress Notes (Signed)
Reviewed home exercise Rx with patient today.  Encouraged warm-up, cool-down, and stretching. Reviewed THRR 59-125 of  and keeping RPE between 11-13. Encouraged to hydrate with activity.  Reviewed weather parameters for temperature and humidity for safe exercise outdoors. Reviewed S/S to terminate exercise and when to call 911 vs MD. Reviewed the use of NTG and pt was encouraged to carry at all times. Pt encouraged to always carry a cell phone for safety when exercising outdoors. Pt verbalized understanding of the home exercise Rx and was provided a copy.  [name] MS, ACSM-CEP, CCRP

## 2023-06-25 NOTE — Assessment & Plan Note (Signed)
Chronic right shoulder pain.  Given injection today, tolerated the procedure well, discussed icing regimen and home exercises.  Increase activity slowly otherwise.  Will follow-up again in 6 to 8 weeks likely still do not move secondary to cervical radiculopathy.

## 2023-06-25 NOTE — Assessment & Plan Note (Signed)
Will monitor at this point.  Patient does think he is making some improvement.

## 2023-06-25 NOTE — Assessment & Plan Note (Signed)
Significant arthritic changes noted.  Discussed with patient about his shoulder as well.  Given an injection of the shoulder to see if this will make any improvement.  With patient still being on the blood thinner is not a candidate for the epidural.  Will increase Cymbalta to 60 mg and warned of potential side effects.  Follow-up with me again in 2 months otherwise.

## 2023-06-25 NOTE — Patient Instructions (Addendum)
Injection in shoulder today Increase Cymbalta to 60mg  See you again at next appointment

## 2023-06-27 ENCOUNTER — Encounter (HOSPITAL_COMMUNITY)
Admission: RE | Admit: 2023-06-27 | Discharge: 2023-06-27 | Disposition: A | Discharge status: Home / Self Care | Payer: Medicare Other | Source: Ambulatory Visit | Attending: Cardiology | Admitting: Cardiology

## 2023-06-27 DIAGNOSIS — I214 Non-ST elevation (NSTEMI) myocardial infarction: Secondary | ICD-10-CM | POA: Diagnosis not present

## 2023-06-27 DIAGNOSIS — Z48812 Encounter for surgical aftercare following surgery on the circulatory system: Secondary | ICD-10-CM | POA: Diagnosis not present

## 2023-06-27 DIAGNOSIS — Z955 Presence of coronary angioplasty implant and graft: Secondary | ICD-10-CM | POA: Diagnosis not present

## 2023-06-30 ENCOUNTER — Encounter (HOSPITAL_COMMUNITY)
Admission: RE | Admit: 2023-06-30 | Discharge: 2023-06-30 | Disposition: A | Discharge status: Home / Self Care | Payer: Medicare Other | Source: Ambulatory Visit | Attending: Cardiology | Admitting: Cardiology

## 2023-06-30 DIAGNOSIS — L989 Disorder of the skin and subcutaneous tissue, unspecified: Secondary | ICD-10-CM | POA: Diagnosis not present

## 2023-06-30 DIAGNOSIS — I214 Non-ST elevation (NSTEMI) myocardial infarction: Secondary | ICD-10-CM | POA: Diagnosis not present

## 2023-06-30 DIAGNOSIS — Z48812 Encounter for surgical aftercare following surgery on the circulatory system: Secondary | ICD-10-CM | POA: Diagnosis not present

## 2023-06-30 DIAGNOSIS — N5201 Erectile dysfunction due to arterial insufficiency: Secondary | ICD-10-CM | POA: Diagnosis not present

## 2023-06-30 DIAGNOSIS — Z955 Presence of coronary angioplasty implant and graft: Secondary | ICD-10-CM

## 2023-07-02 ENCOUNTER — Encounter (HOSPITAL_COMMUNITY)
Admission: RE | Admit: 2023-07-02 | Discharge: 2023-07-02 | Disposition: A | Discharge status: Home / Self Care | Payer: Medicare Other | Source: Ambulatory Visit | Attending: Cardiology | Admitting: Cardiology

## 2023-07-02 DIAGNOSIS — I214 Non-ST elevation (NSTEMI) myocardial infarction: Secondary | ICD-10-CM | POA: Diagnosis not present

## 2023-07-02 DIAGNOSIS — Z955 Presence of coronary angioplasty implant and graft: Secondary | ICD-10-CM

## 2023-07-02 DIAGNOSIS — Z48812 Encounter for surgical aftercare following surgery on the circulatory system: Secondary | ICD-10-CM | POA: Diagnosis not present

## 2023-07-03 ENCOUNTER — Ambulatory Visit: Payer: Medicare Other | Admitting: Family Medicine

## 2023-07-03 ENCOUNTER — Encounter: Payer: Self-pay | Admitting: Family Medicine

## 2023-07-03 DIAGNOSIS — M5412 Radiculopathy, cervical region: Secondary | ICD-10-CM | POA: Diagnosis not present

## 2023-07-03 DIAGNOSIS — R293 Abnormal posture: Secondary | ICD-10-CM | POA: Diagnosis not present

## 2023-07-03 DIAGNOSIS — M25611 Stiffness of right shoulder, not elsewhere classified: Secondary | ICD-10-CM | POA: Diagnosis not present

## 2023-07-03 DIAGNOSIS — M7541 Impingement syndrome of right shoulder: Secondary | ICD-10-CM | POA: Diagnosis not present

## 2023-07-03 DIAGNOSIS — M25511 Pain in right shoulder: Secondary | ICD-10-CM | POA: Diagnosis not present

## 2023-07-04 ENCOUNTER — Encounter (HOSPITAL_COMMUNITY)
Admission: RE | Admit: 2023-07-04 | Discharge: 2023-07-04 | Disposition: A | Discharge status: Home / Self Care | Payer: Medicare Other | Source: Ambulatory Visit | Attending: Cardiology | Admitting: Cardiology

## 2023-07-04 DIAGNOSIS — Z955 Presence of coronary angioplasty implant and graft: Secondary | ICD-10-CM

## 2023-07-04 DIAGNOSIS — I214 Non-ST elevation (NSTEMI) myocardial infarction: Secondary | ICD-10-CM

## 2023-07-04 DIAGNOSIS — Z48812 Encounter for surgical aftercare following surgery on the circulatory system: Secondary | ICD-10-CM | POA: Diagnosis not present

## 2023-07-07 ENCOUNTER — Encounter (HOSPITAL_COMMUNITY): Admission: RE | Admit: 2023-07-07 | Payer: Medicare Other | Source: Ambulatory Visit

## 2023-07-07 DIAGNOSIS — I214 Non-ST elevation (NSTEMI) myocardial infarction: Secondary | ICD-10-CM

## 2023-07-07 DIAGNOSIS — Z955 Presence of coronary angioplasty implant and graft: Secondary | ICD-10-CM | POA: Diagnosis not present

## 2023-07-07 DIAGNOSIS — Z48812 Encounter for surgical aftercare following surgery on the circulatory system: Secondary | ICD-10-CM | POA: Diagnosis not present

## 2023-07-08 DIAGNOSIS — M25511 Pain in right shoulder: Secondary | ICD-10-CM | POA: Diagnosis not present

## 2023-07-08 DIAGNOSIS — R293 Abnormal posture: Secondary | ICD-10-CM | POA: Diagnosis not present

## 2023-07-08 DIAGNOSIS — M5412 Radiculopathy, cervical region: Secondary | ICD-10-CM | POA: Diagnosis not present

## 2023-07-08 DIAGNOSIS — M7541 Impingement syndrome of right shoulder: Secondary | ICD-10-CM | POA: Diagnosis not present

## 2023-07-08 DIAGNOSIS — M25611 Stiffness of right shoulder, not elsewhere classified: Secondary | ICD-10-CM | POA: Diagnosis not present

## 2023-07-09 ENCOUNTER — Encounter (HOSPITAL_COMMUNITY): Admission: RE | Admit: 2023-07-09 | Payer: Medicare Other | Source: Ambulatory Visit

## 2023-07-09 ENCOUNTER — Ambulatory Visit: Payer: Medicare Other | Admitting: Family Medicine

## 2023-07-09 DIAGNOSIS — Z48812 Encounter for surgical aftercare following surgery on the circulatory system: Secondary | ICD-10-CM | POA: Diagnosis not present

## 2023-07-09 DIAGNOSIS — I214 Non-ST elevation (NSTEMI) myocardial infarction: Secondary | ICD-10-CM

## 2023-07-09 DIAGNOSIS — Z955 Presence of coronary angioplasty implant and graft: Secondary | ICD-10-CM

## 2023-07-10 DIAGNOSIS — M25611 Stiffness of right shoulder, not elsewhere classified: Secondary | ICD-10-CM | POA: Diagnosis not present

## 2023-07-10 DIAGNOSIS — R293 Abnormal posture: Secondary | ICD-10-CM | POA: Diagnosis not present

## 2023-07-10 DIAGNOSIS — M25511 Pain in right shoulder: Secondary | ICD-10-CM | POA: Diagnosis not present

## 2023-07-10 DIAGNOSIS — M7541 Impingement syndrome of right shoulder: Secondary | ICD-10-CM | POA: Diagnosis not present

## 2023-07-10 DIAGNOSIS — M5412 Radiculopathy, cervical region: Secondary | ICD-10-CM | POA: Diagnosis not present

## 2023-07-11 ENCOUNTER — Encounter (HOSPITAL_COMMUNITY): Admission: RE | Admit: 2023-07-11 | Payer: Medicare Other | Source: Ambulatory Visit

## 2023-07-11 DIAGNOSIS — Z955 Presence of coronary angioplasty implant and graft: Secondary | ICD-10-CM

## 2023-07-11 DIAGNOSIS — I214 Non-ST elevation (NSTEMI) myocardial infarction: Secondary | ICD-10-CM | POA: Diagnosis not present

## 2023-07-11 DIAGNOSIS — Z48812 Encounter for surgical aftercare following surgery on the circulatory system: Secondary | ICD-10-CM | POA: Diagnosis not present

## 2023-07-14 ENCOUNTER — Encounter (HOSPITAL_COMMUNITY)
Admission: RE | Admit: 2023-07-14 | Discharge: 2023-07-14 | Disposition: A | Discharge status: Home / Self Care | Payer: Medicare Other | Source: Ambulatory Visit | Attending: Cardiology | Admitting: Cardiology

## 2023-07-14 DIAGNOSIS — I214 Non-ST elevation (NSTEMI) myocardial infarction: Secondary | ICD-10-CM

## 2023-07-14 DIAGNOSIS — Z48812 Encounter for surgical aftercare following surgery on the circulatory system: Secondary | ICD-10-CM | POA: Diagnosis not present

## 2023-07-14 DIAGNOSIS — Z955 Presence of coronary angioplasty implant and graft: Secondary | ICD-10-CM

## 2023-07-15 ENCOUNTER — Telehealth (HOSPITAL_COMMUNITY): Payer: Self-pay

## 2023-07-15 DIAGNOSIS — M5412 Radiculopathy, cervical region: Secondary | ICD-10-CM | POA: Diagnosis not present

## 2023-07-15 DIAGNOSIS — M25611 Stiffness of right shoulder, not elsewhere classified: Secondary | ICD-10-CM | POA: Diagnosis not present

## 2023-07-15 DIAGNOSIS — M7541 Impingement syndrome of right shoulder: Secondary | ICD-10-CM | POA: Diagnosis not present

## 2023-07-15 DIAGNOSIS — R293 Abnormal posture: Secondary | ICD-10-CM | POA: Diagnosis not present

## 2023-07-15 DIAGNOSIS — M25511 Pain in right shoulder: Secondary | ICD-10-CM | POA: Diagnosis not present

## 2023-07-15 NOTE — Progress Notes (Signed)
Cardiac Individual Treatment Plan  Patient Details  Name: TIMOTHY HOSTEN MRN: [id number] Date of Birth: June 04, 1951 Referring Provider:   Flowsheet Row INTENSIVE CARDIAC REHAB ORIENT from 06/03/2023 in [facility]  Referring Provider Donato Schultz, MD       Initial Encounter Date:  Flowsheet Row INTENSIVE CARDIAC REHAB ORIENT from 06/03/2023 in [facility]  Date 06/03/23       Visit Diagnosis: 05/10/23 NSTEMI (non-ST elevated myocardial infarction) (HCC)  05/12/23 Status post coronary artery stent placement  Patient's Home Medications on Admission:  Current Outpatient Medications:    aspirin EC 81 MG tablet, Take 1 tablet (81 mg total) by mouth daily. Swallow whole., Disp: 120 tablet, Rfl: 2   DULoxetine (CYMBALTA) 20 MG capsule, TAKE 2 CAPSULES BY MOUTH DAILY, Disp: 60 capsule, Rfl: 3   DULoxetine (CYMBALTA) 60 MG capsule, Take 1 capsule (60 mg total) by mouth daily., Disp: 90 capsule, Rfl: 3   gabapentin (NEURONTIN) 300 MG capsule, Take 1 capsule (300 mg total) by mouth 2 (two) times daily., Disp: 60 capsule, Rfl: 1   isosorbide mononitrate (IMDUR) 30 MG 24 hr tablet, Take 1 tablet (30 mg total) by mouth daily., Disp: 90 tablet, Rfl: 3   nitroGLYCERIN (NITROSTAT) 0.4 MG SL tablet, Place 1 tablet (0.4 mg total) under the tongue every 5 (five) minutes x 3 doses as needed for chest pain., Disp: 25 tablet, Rfl: 2   prasugrel (EFFIENT) 10 MG TABS tablet, Take 6 tablets by mouth 24 hours after last Brilinta dose. Then 24 hours later take 1 tablet (10mg ) by mouth daily., Disp: 90 tablet, Rfl: 3   rosuvastatin (CRESTOR) 20 MG tablet, Take 1 tablet (20 mg total) by mouth daily., Disp: 90 tablet, Rfl: 3   sildenafil (VIAGRA) 100 MG tablet, Take 100 mg by mouth daily as needed for erectile dysfunction., Disp: , Rfl:    ticagrelor (BRILINTA) 90 MG TABS tablet, Take by mouth 2  (two) times daily., Disp: , Rfl:    Vitamin D, Ergocalciferol, (DRISDOL) 1.25 MG (50000 UNIT) CAPS capsule, TAKE 1 CAPSULE (50,000 UNITS TOTAL) BY MOUTH EVERY 7 (SEVEN) DAYS, Disp: 12 capsule, Rfl: 0  Past Medical History: Past Medical History:  Diagnosis Date   Cataract    Depression    RESOLVED   Hyperlipidemia    Spinal stenosis    WAS HAVING NECK PAIN -PT STATES NERVE ABLATION PROCEDURE 2012 AND PAIN HAS RESOLVED   Spondylitis (HCC)     Tobacco Use: Social History   Tobacco Use  Smoking Status Never  Smokeless Tobacco Never    Labs: Review Flowsheet       Latest Ref Rng & Units 04/18/2018 05/26/2019 05/11/2023 05/26/2023  Labs for ITP Cardiac and Pulmonary Rehab  Cholestrol 100 - 199 mg/dL - 425  - 956   LDL (calc) 0 - 99 mg/dL - 58  - 75   HDL-C >38 mg/dL - 80  - 46   Trlycerides 0 - 149 mg/dL - 96  - 756   Hemoglobin A1c 4.8 - 5.6 % - - 5.1  -  TCO2 22 - 32 mmol/L 29  - - -    Details            Capillary Blood Glucose: No results found for: "GLUCAP"   Exercise Target Goals: Exercise Program Goal: Individual exercise prescription set using results from initial 6 min walk test and THRR  while considering  patient's activity barriers and safety.   Exercise Prescription Goal: Initial exercise prescription builds to 30-45 minutes a day of aerobic activity, 2-3 days per week.  Home exercise guidelines will be given to patient during program as part of exercise prescription that the participant will acknowledge.  Activity Barriers & Risk Stratification:  Activity Barriers & Cardiac Risk Stratification - 06/03/23 1109       Activity Barriers & Cardiac Risk Stratification   Activity Barriers Arthritis;Joint Problems;Neck/Spine Problems;Chest Pain/Angina    Cardiac Risk Stratification Moderate             6 Minute Walk:  6 Minute Walk     Row Name 06/03/23 1108         6 Minute Walk   Phase Initial     Distance 1981 feet     Walk Time 6 minutes      # of Rest Breaks 0     MPH 3.8     METS 4.14     RPE 11     Perceived Dyspnea  0     VO2 Peak 14.5     Symptoms No     Resting HR 76 bpm     Resting BP 114/72     Resting Oxygen Saturation  96 %     Exercise Oxygen Saturation  during 6 min walk 96 %     Max Ex. HR 99 bpm     Max Ex. BP 120/70     2 Minute Post BP 112/68              Oxygen Initial Assessment:   Oxygen Re-Evaluation:   Oxygen Discharge (Final Oxygen Re-Evaluation):   Initial Exercise Prescription:  Initial Exercise Prescription - 06/03/23 1100       Date of Initial Exercise RX and Referring Provider   Date 06/03/23    Referring Provider Donato Schultz, MD    Expected Discharge Date 08/20/23      Treadmill   MPH 3.4    Grade 1    Minutes 15    METs 4.07      Bike   Level 3    Watts 50    Minutes 15    METs 4.1      Prescription Details   Frequency (times per week) 3    Duration Progress to 30 minutes of continuous aerobic without signs/symptoms of physical distress      Intensity   THRR 40-80% of Max Heartrate 59-118    Ratings of Perceived Exertion 11-13    Perceived Dyspnea 0-4      Progression   Progression Continue progressive overload as per policy without signs/symptoms or physical distress.      Resistance Training   Training Prescription Yes    Weight 3lbs    Reps 10-15             Perform Capillary Blood Glucose checks as needed.  Exercise Prescription Changes:   Exercise Prescription Changes     Row Name 06/09/23 1600 06/25/23 1200 07/04/23 1600         Response to Exercise   Blood Pressure (Admit) 112/68 112/66 118/70     Blood Pressure (Exercise) 150/80 158/70 142/80     Blood Pressure (Exit) 112/70 116/64 110/70     Heart Rate (Admit) 68 bpm 81 bpm 68 bpm     Heart Rate (Exercise) 125 bpm 131 bpm 126 bpm     Heart Rate (Exit) 77 bpm  90 bpm 77 bpm     Rating of Perceived Exertion (Exercise) 9 11 13      Symptoms None None None     Comments Pt's firs  day in the CRP2 porgram Reviewed METs and home exercise Rx Reviewed METs/goals     Duration Continue with 30 min of aerobic exercise without signs/symptoms of physical distress. Continue with 30 min of aerobic exercise without signs/symptoms of physical distress. Continue with 30 min of aerobic exercise without signs/symptoms of physical distress.     Intensity THRR unchanged THRR unchanged THRR unchanged       Progression   Progression Continue to progress workloads to maintain intensity without signs/symptoms of physical distress. Continue to progress workloads to maintain intensity without signs/symptoms of physical distress. Continue to progress workloads to maintain intensity without signs/symptoms of physical distress.     Average METs 4.45 7.23 7.6       Resistance Training   Training Prescription Yes No Yes     Weight 3lbs No weights on Wednesdays 6 lbs     Reps 10-15 -- 10-15     Time 10 Minutes -- 10 Minutes       Interval Training   Interval Training No Yes Yes     Equipment -- Treadmill Treadmill     Comments -- HIIT HIIT       Treadmill   MPH 3.4 4.2 4.8     Grade 1 1 1      Minutes 15 15 15      METs 4.07 7.72 8.68       Bike   Level 3 4 4      Watts 74 106 103     Minutes 15 15 15      METs 4.9 9.9 6.5       Home Exercise Plan   Plans to continue exercise at -- Home (comment) Home (comment)     Frequency -- Add 2 additional days to program exercise sessions. Add 2 additional days to program exercise sessions.     Initial Home Exercises Provided -- 06/25/23 06/25/23              Exercise Comments:   Exercise Comments     Row Name 06/09/23 1642 06/25/23 1214 07/04/23 1643       Exercise Comments Pt's first day in the CRP2 program. Pt exercised without comlaints and is off to a good start. Reviewed METs and Home exercise Rx. Pt is making exceelent progress and jogs/walks and uses stationary bike on the weekends. Pt verbalized understanding of the home exericse  Rx and was provided a copy. Reviewed METs and goals. Making good progress. Wants to have another increase in THRR. Explained that we increased to 90%. Pt feels like he wants to push harder but is limited by THRR.              Exercise Goals and Review:   Exercise Goals     Row Name 06/03/23 1109             Exercise Goals   Increase Physical Activity Yes       Intervention Provide advice, education, support and counseling about physical activity/exercise needs.;Develop an individualized exercise prescription for aerobic and resistive training based on initial evaluation findings, risk stratification, comorbidities and participant's personal goals.       Expected Outcomes Short Term: Attend rehab on a regular basis to increase amount of physical activity.;Long Term: Add in home exercise to make exercise part of routine  and to increase amount of physical activity.;Long Term: Exercising regularly at least 3-5 days a week.       Increase Strength and Stamina Yes       Intervention Provide advice, education, support and counseling about physical activity/exercise needs.;Develop an individualized exercise prescription for aerobic and resistive training based on initial evaluation findings, risk stratification, comorbidities and participant's personal goals.       Expected Outcomes Short Term: Increase workloads from initial exercise prescription for resistance, speed, and METs.;Short Term: Perform resistance training exercises routinely during rehab and add in resistance training at home;Long Term: Improve cardiorespiratory fitness, muscular endurance and strength as measured by increased METs and functional capacity ( )       Able to understand and use rate of perceived exertion (RPE) scale Yes       Intervention Provide education and explanation on how to use RPE scale       Expected Outcomes Short Term: Able to use RPE daily in rehab to express subjective intensity level;Long Term:  Able  to use RPE to guide intensity level when exercising independently       Knowledge and understanding of Target Heart Rate Range (THRR) Yes       Intervention Provide education and explanation of THRR including how the numbers were predicted and where they are located for reference       Expected Outcomes Short Term: Able to state/look up THRR;Long Term: Able to use THRR to govern intensity when exercising independently;Short Term: Able to use daily as guideline for intensity in rehab       Understanding of Exercise Prescription Yes       Intervention Provide education, explanation, and written materials on patient's individual exercise prescription       Expected Outcomes Short Term: Able to explain program exercise prescription;Long Term: Able to explain home exercise prescription to exercise independently                Exercise Goals Re-Evaluation :  Exercise Goals Re-Evaluation     Row Name 06/09/23 1641 07/04/23 1642           Exercise Goal Re-Evaluation   Exercise Goals Review Increase Physical Activity;Able to understand and use Dyspnea scale;Increase Strength and Stamina;Knowledge and understanding of Target Heart Rate Range (THRR);Able to understand and use rate of perceived exertion (RPE) scale Increase Physical Activity;Able to understand and use Dyspnea scale;Increase Strength and Stamina;Knowledge and understanding of Target Heart Rate Range (THRR);Able to understand and use rate of perceived exertion (RPE) scale      Comments Pt's first day in the CRP2 program. Pt understnads the exercise Rx, THRR and RPE scale. Reviewed METs and goals. Pt is making excellent progress with peak METs of 9.9 and average METs of 7.6.  Pt voices progress on his goal of gaining confidence to train. Pt is exercise at home on off days from the CRP2 program.      Expected Outcomes Will continue to monitor and progress exercise workloads as toelrated. Will continue to monitor and progress exercise  workloads as toelrated.               Discharge Exercise Prescription (Final Exercise Prescription Changes):  Exercise Prescription Changes - 07/04/23 1600       Response to Exercise   Blood Pressure (Admit) 118/70    Blood Pressure (Exercise) 142/80    Blood Pressure (Exit) 110/70    Heart Rate (Admit) 68 bpm    Heart Rate (Exercise) 126 bpm  Heart Rate (Exit) 77 bpm    Rating of Perceived Exertion (Exercise) 13    Symptoms None    Comments Reviewed METs/goals    Duration Continue with 30 min of aerobic exercise without signs/symptoms of physical distress.    Intensity THRR unchanged      Progression   Progression Continue to progress workloads to maintain intensity without signs/symptoms of physical distress.    Average METs 7.6      Resistance Training   Training Prescription Yes    Weight 6 lbs    Reps 10-15    Time 10 Minutes      Interval Training   Interval Training Yes    Equipment Treadmill    Comments HIIT      Treadmill   MPH 4.8    Grade 1    Minutes 15    METs 8.68      Bike   Level 4    Watts 103    Minutes 15    METs 6.5      Home Exercise Plan   Plans to continue exercise at Home (comment)    Frequency Add 2 additional days to program exercise sessions.    Initial Home Exercises Provided 06/25/23             Nutrition:  Target Goals: Understanding of nutrition guidelines, daily intake of sodium 1500mg , cholesterol 200mg , calories 30% from fat and 7% or less from saturated fats, daily to have 5 or more servings of fruits and vegetables.  Biometrics:  Pre Biometrics - 06/03/23 1110       Pre Biometrics   Waist Circumference 37.5 inches    Hip Circumference 37.75 inches    Waist to Hip Ratio 0.99 %    Triceps Skinfold 7 mm    % Body Fat 21.9 %    Grip Strength 36 kg    Flexibility 14.25 in    Single Leg Stand 30 seconds              Nutrition Therapy Plan and Nutrition Goals:  Nutrition Therapy & Goals - 07/11/23  1003       Nutrition Therapy   Diet Heart Healthy Diet    Drug/Food Interactions Statins/Certain Fruits      Personal Nutrition Goals   Nutrition Goal Patient to identify strategies for reducing cardiovascular risk by attending the [employer] education and nutrition series weekly.   Goal in action.   Personal Goal #2 Patient to improve diet quality by using the plate method as a guide for meal planning to include lean protein/plant protein, fruits, vegetables, whole grains, nonfat dairy as part of a well-balanced diet.   goal in action.   Comments Goals in action Ogden continues to attend the [employer] education and nutrition series regularly. He has good understanding of fiber recommendations, sodium recommendations, and saturated fat intake. Timohty is motivated to get back to training for half iron man races. He does report that his race day weight is ~152#. He is tracking his intake with MyFitness Pal to aid with weight loss; answered patient questions regarding macronutrients, calorie needs, etc. He is down 1.1# since starting with our program. Patient will benefit from participation in intensive cardiac rehab for nutrition, exercise, and lifestyle modification.      Intervention Plan   Intervention Prescribe, educate and counsel regarding individualized specific dietary modifications aiming towards targeted core components such as weight, hypertension, lipid management, diabetes, heart failure and other comorbidities.;Nutrition handout(s) given to patient.  Expected Outcomes Short Term Goal: Understand basic principles of dietary content, such as calories, fat, sodium, cholesterol and nutrients.;Long Term Goal: Adherence to prescribed nutrition plan.;Short Term Goal: A plan has been developed with personal nutrition goals set during dietitian appointment.             Nutrition Assessments:  Nutrition Assessments - 06/10/23 0927       Rate Your Plate Scores   Pre Score 63             MEDIFICTS Score Key: ?70 Need to make dietary changes  40-70 Heart Healthy Diet ? 40 Therapeutic Level Cholesterol Diet   Flowsheet Row INTENSIVE CARDIAC REHAB from 06/09/2023 in [facility]  Picture Your Plate Total Score on Admission 63      Picture Your Plate Scores: <09 Unhealthy dietary pattern with much room for improvement. 41-50 Dietary pattern unlikely to meet recommendations for good health and room for improvement. 51-60 More healthful dietary pattern, with some room for improvement.  >60 Healthy dietary pattern, although there may be some specific behaviors that could be improved.    Nutrition Goals Re-Evaluation:  Nutrition Goals Re-Evaluation     Row Name 06/09/23 0929 07/11/23 1003           Goals   Current Weight 170 lb 13.7 oz (77.5 kg) 169 lb 12.1 oz (77 kg)      Comment lipoproteinA WNL, LDL 75, A1c WNL no new labs; most recent labs lipoproteinA WNL, LDL 75, A1c WNL      Expected Outcome Grame is motivated to get back to training for half iron man races. He reports that he has previously followed the [employer] eating plan in the !990s and is motivated to get back to the [employer] eating plan. He does report that his race day weight is ~152#. Patient will benefit from participation in intensive cardiac rehab for nutrition, exercise, and lifestyle modification. Goals in action Jaryan continues to attend the [employer] education and nutrition series regularly. He has good understanding of fiber recommendations, sodium recommendations, and saturated fat intake. Dayshaun is motivated to get back to training for half iron man races. He does report that his race day weight is ~152#. He is tracking his intake with MyFitness Pal to aid with weight loss; answered patient questions regarding macronutrients, calorie needs, etc. He is down 1.1# since starting with our program. Patient will benefit from participation in  intensive cardiac rehab for nutrition, exercise, and lifestyle modification.               Nutrition Goals Re-Evaluation:  Nutrition Goals Re-Evaluation     Row Name 06/09/23 0929 07/11/23 1003           Goals   Current Weight 170 lb 13.7 oz (77.5 kg) 169 lb 12.1 oz (77 kg)      Comment lipoproteinA WNL, LDL 75, A1c WNL no new labs; most recent labs lipoproteinA WNL, LDL 75, A1c WNL      Expected Outcome Grame is motivated to get back to training for half iron man races. He reports that he has previously followed the [employer] eating plan in the !990s and is motivated to get back to the [employer] eating plan. He does report that his race day weight is ~152#. Patient will benefit from participation in intensive cardiac rehab for nutrition, exercise, and lifestyle modification. Goals in action Abed continues to attend the [employer] education and nutrition series regularly. He  has good understanding of fiber recommendations, sodium recommendations, and saturated fat intake. Lynkoln is motivated to get back to training for half iron man races. He does report that his race day weight is ~152#. He is tracking his intake with MyFitness Pal to aid with weight loss; answered patient questions regarding macronutrients, calorie needs, etc. He is down 1.1# since starting with our program. Patient will benefit from participation in intensive cardiac rehab for nutrition, exercise, and lifestyle modification.               Nutrition Goals Discharge (Final Nutrition Goals Re-Evaluation):  Nutrition Goals Re-Evaluation - 07/11/23 1003       Goals   Current Weight 169 lb 12.1 oz (77 kg)    Comment no new labs; most recent labs lipoproteinA WNL, LDL 75, A1c WNL    Expected Outcome Goals in action Arpad continues to attend the [employer] education and nutrition series regularly. He has good understanding of fiber recommendations, sodium recommendations, and saturated fat intake. Antaeus is motivated to get  back to training for half iron man races. He does report that his race day weight is ~152#. He is tracking his intake with MyFitness Pal to aid with weight loss; answered patient questions regarding macronutrients, calorie needs, etc. He is down 1.1# since starting with our program. Patient will benefit from participation in intensive cardiac rehab for nutrition, exercise, and lifestyle modification.             Psychosocial: Target Goals: Acknowledge presence or absence of significant depression and/or stress, maximize coping skills, provide positive support system. Participant is able to verbalize types and ability to use techniques and skills needed for reducing stress and depression.  Initial Review & Psychosocial Screening:  Initial Psych Review & Screening - 06/03/23 1040       Initial Review   Current issues with Current Stress Concerns    Comments Pt has just recently moved into a new home      Family Dynamics   Good Support System? Yes   Has spouse, daughter and dogs     Barriers   Psychosocial barriers to participate in program The patient should benefit from training in stress management and relaxation.      Screening Interventions   Interventions Encouraged to exercise             Quality of Life Scores:  Quality of Life - 06/03/23 1105       Quality of Life   Select Quality of Life      Quality of Life Scores   Health/Function Pre 24.83 %    Socioeconomic Pre 25.6 %    Psych/Spiritual Pre 25.75 %    Family Pre 26.4 %    GLOBAL Pre 25.39 %            Scores of 19 and below usually indicate a poorer quality of life in these areas.  A difference of  2-3 points is a clinically meaningful difference.  A difference of 2-3 points in the total score of the Quality of Life Index has been associated with significant improvement in overall quality of life, self-image, physical symptoms, and general health in studies assessing change in quality of  life.  PHQ-9: Review Flowsheet  More data may exist      06/03/2023 10/16/2015 09/12/2015 07/18/2015 07/07/2015  Depression screen PHQ 2/9  Decreased Interest 1 0 0 0 0 0  Down, Depressed, Hopeless 1 0 0 0 0 0  PHQ -  2 Score 2 0 0 0 0 0  Altered sleeping 0 - - - -  Tired, decreased energy 1 - - - -  Change in appetite 1 - - - -  Feeling bad or failure about yourself  0 - - - -  Trouble concentrating 0 - - - -  Moving slowly or fidgety/restless 0 - - - -  Suicidal thoughts 0 - - - -  PHQ-9 Score 4 - - - -  Difficult doing work/chores Not difficult at all - - - -    Details       Multiple values from one day are sorted in reverse-chronological order        Interpretation of Total Score  Total Score Depression Severity:  1-4 = Minimal depression, 5-9 = Mild depression, 10-14 = Moderate depression, 15-19 = Moderately severe depression, 20-27 = Severe depression   Psychosocial Evaluation and Intervention:   Psychosocial Re-Evaluation:  Psychosocial Re-Evaluation     Row Name 06/09/23 [phone] 06/18/23 0831 07/15/23 0921         Psychosocial Re-Evaluation   Current issues with None Identified None Identified None Identified     Comments Joshual denies stress or concerns on his first day of exercise. -- --     Expected Outcomes Eustacio will have decreased concerns or stressors on his first day of exercise -- --     Interventions Encouraged to attend Cardiac Rehabilitation for the exercise;Stress management education Encouraged to attend Cardiac Rehabilitation for the exercise Encouraged to attend Cardiac Rehabilitation for the exercise     Continue Psychosocial Services  No Follow up required No Follow up required No Follow up required              Psychosocial Discharge (Final Psychosocial Re-Evaluation):  Psychosocial Re-Evaluation - 07/15/23 0921       Psychosocial Re-Evaluation   Current issues with None Identified    Interventions Encouraged to attend Cardiac  Rehabilitation for the exercise    Continue Psychosocial Services  No Follow up required             Vocational Rehabilitation: Provide vocational rehab assistance to qualifying candidates.   Vocational Rehab Evaluation & Intervention:  Vocational Rehab - 06/03/23 1041       Initial Vocational Rehab Evaluation & Intervention   Assessment shows need for Vocational Rehabilitation No   Pt is retitred, no needs            Education: Education Goals: Education classes will be provided on a weekly basis, covering required topics. Participant will state understanding/return demonstration of topics presented.    Education     Row Name 06/09/23 0900     Education   Cardiac Education Topics [employer]in   Select Core Videos     Core Videos   Educator Exercise Physiologist   Select Nutrition   Nutrition Facts on Fat   Instruction Review Code 1- Verbalizes Understanding   Class Start Time [phone]   Class Stop Time 0848   Class Time Calculation (min) 32 min    Row Name 06/11/23 1000     Education   Cardiac Education Topics [employer]in   Secondary school teacher School   Educator Dietitian   Weekly Topic Fast Evening Meals   Instruction Review Code 1- Verbalizes Understanding   Class Start Time [phone]   Class Stop Time 0845   Class Time Calculation (min) 33 min    Row Name 06/13/23 0900  Education   Cardiac Education Topics Pritikin   Licensed conveyancer Nutrition   Nutrition Vitamins and Minerals   Instruction Review Code 1- Verbalizes Understanding   Class Start Time 0818   Class Stop Time 0857   Class Time Calculation (min) 39 min    Row Name 06/16/23 1100     Education   Cardiac Education Topics Pritikin   Select Workshops     Workshops   Educator Exercise Physiologist   Select Psychosocial   Psychosocial Workshop Healthy Sleep for a Healthy Heart   Instruction Review Code 1- Verbalizes  Understanding   Class Start Time [phone]   Class Stop Time 0900   Class Time Calculation (min) 50 min    Row Name 06/18/23 1000     Education   Cardiac Education Topics Pritikin   Customer service manager   Weekly Topic [employer]- Spotlight on the [facility] Zones   Instruction Review Code 1- Verbalizes Understanding   Class Start Time 0815   Class Stop Time 0850   Class Time Calculation (min) 35 min    Row Name 06/20/23 0900     Education   Cardiac Education Topics Pritikin   Select Core Videos     Core Videos   Educator Exercise Physiologist   Select Exercise Education   Exercise Education Improving Performance   Instruction Review Code 1- Verbalizes Understanding   Class Start Time 0810   Class Stop Time 0846   Class Time Calculation (min) 36 min    Row Name 06/23/23 0900     Education   Cardiac Education Topics Pritikin   Select Workshops     Workshops   Educator Dietitian   Select Nutrition   Nutrition Workshop Fueling a Forensic psychologist   Instruction Review Code 1- Verbalizes Understanding   Class Start Time 0815   Class Stop Time 0900   Class Time Calculation (min) 45 min    Row Name 06/25/23 1000     Education   Cardiac Education Topics Pritikin   Secondary school teacher School   Educator Dietitian   Weekly Topic Simple Sides and Sauces   Instruction Review Code 1- Verbalizes Understanding   Class Start Time 0815   Class Stop Time 0846   Class Time Calculation (min) 31 min    Row Name 06/27/23 0900     Education   Cardiac Education Topics Pritikin   Select Core Videos     Core Videos   Educator Exercise Physiologist   Select Psychosocial   Psychosocial How Our Thoughts Can Heal Our Hearts   Instruction Review Code 1- Verbalizes Understanding   Class Start Time 0807   Class Stop Time 0842   Class Time Calculation (min) 35 min    Row Name 06/30/23 0900     Education   Cardiac Education  Topics Pritikin   Select Workshops     Workshops   Educator Exercise Physiologist   Select Exercise   Exercise Workshop Managing Heart Disease: Your Path to a Healthier Heart   Instruction Review Code 1- Verbalizes Understanding   Class Start Time [phone]   Class Stop Time 0908   Class Time Calculation (min) 60 min    Row Name 07/02/23 1000     Education   Cardiac Education Topics Pritikin   [employer]  Secondary school teacher   Weekly Topic Powerhouse Plant-Based Proteins   Instruction Review Code 1- Verbalizes Understanding   Class Start Time 0815   Class Stop Time 0856   Class Time Calculation (min) 41 min    Row Name 07/04/23 0900     Education   Cardiac Education Topics Pritikin   Select Core Videos     Core Videos   Educator Exercise Physiologist   Select General Education   General Education Hypertension and Heart Disease   Instruction Review Code 1- Verbalizes Understanding   Class Start Time [phone]   Class Stop Time 0850   Class Time Calculation (min) 40 min    Row Name 07/07/23 0900     Education   Cardiac Education Topics Pritikin   Geographical information systems officer Psychosocial   Psychosocial Workshop From Head to Heart: The Power of a Healthy Outlook   Instruction Review Code 1- Verbalizes Understanding   Class Start Time 0815   Class Stop Time 0904   Class Time Calculation (min) 49 min    Row Name 07/09/23 1000     Education   Cardiac Education Topics Pritikin   Secondary school teacher School   Educator Dietitian   Weekly Topic Adding Flavor - Sodium-Free   Instruction Review Code 1- Verbalizes Understanding   Class Start Time [phone]   Class Stop Time 0845   Class Time Calculation (min) 31 min    Row Name 07/14/23 1100     Education   Cardiac Education Topics Pritikin   Select Core Videos     Core Videos   Educator Dietitian   Select Nutrition   Nutrition  Overview of the Pritikin Eating Plan   Instruction Review Code 1- Verbalizes Understanding   Class Start Time 0815   Class Stop Time 0850   Class Time Calculation (min) 35 min            Core Videos: Exercise    Move It!  Clinical staff conducted group or individual video education with verbal and written material and guidebook.  Patient learns the recommended Pritikin exercise program. Exercise with the goal of living a long, healthy life. Some of the health benefits of exercise include controlled diabetes, healthier blood pressure levels, improved cholesterol levels, improved heart and lung capacity, improved sleep, and better body composition. Everyone should speak with their doctor before starting or changing an exercise routine.  Biomechanical Limitations Clinical staff conducted group or individual video education with verbal and written material and guidebook.  Patient learns how biomechanical limitations can impact exercise and how we can mitigate and possibly overcome limitations to have an impactful and balanced exercise routine.  Body Composition Clinical staff conducted group or individual video education with verbal and written material and guidebook.  Patient learns that body composition (ratio of muscle mass to fat mass) is a key component to assessing overall fitness, rather than body weight alone. Increased fat mass, especially visceral belly fat, can put [place] at increased risk for metabolic syndrome, type 2 diabetes, heart disease, and even death. It is recommended to combine diet and exercise (cardiovascular and resistance training) to improve your body composition. Seek guidance from your physician and exercise physiologist before implementing an exercise routine.  Exercise Action Plan Clinical staff conducted group or individual video education with verbal and written material and guidebook.  Patient learns the recommended strategies to  achieve and enjoy long-term  exercise adherence, including variety, self-motivation, self-efficacy, and positive decision making. Benefits of exercise include fitness, good health, weight management, more energy, better sleep, less stress, and overall well-being.  Medical   Heart Disease Risk Reduction Clinical staff conducted group or individual video education with verbal and written material and guidebook.  Patient learns our heart is our most vital organ as it circulates oxygen, nutrients, white blood cells, and hormones throughout the entire body, and carries waste away. Data supports a plant-based eating plan like the Pritikin Program for its effectiveness in slowing progression of and reversing heart disease. The video provides a number of recommendations to address heart disease.   Metabolic Syndrome and Belly Fat  Clinical staff conducted group or individual video education with verbal and written material and guidebook.  Patient learns what metabolic syndrome is, how it leads to heart disease, and how one can reverse it and keep it from coming back. You have metabolic syndrome if you have 3 of the following 5 criteria: abdominal obesity, high blood pressure, high triglycerides, low HDL cholesterol, and high blood sugar.  Hypertension and Heart Disease Clinical staff conducted group or individual video education with verbal and written material and guidebook.  Patient learns that high blood pressure, or hypertension, is very common in the [place]. Hypertension is largely due to excessive salt intake, but other important risk factors include being overweight, physical inactivity, drinking too much alcohol, smoking, and not eating enough potassium from fruits and vegetables. High blood pressure is a leading risk factor for heart attack, stroke, congestive heart failure, dementia, kidney failure, and premature death. Long-term effects of excessive salt intake include stiffening of the arteries and thickening of heart  muscle and organ damage. Recommendations include ways to reduce hypertension and the risk of heart disease.  Diseases of Our Time - Focusing on Diabetes Clinical staff conducted group or individual video education with verbal and written material and guidebook.  Patient learns why the best way to stop diseases of our time is prevention, through food and other lifestyle changes. Medicine (such as prescription pills and surgeries) is often only a Band-Aid on the problem, not a long-term solution. Most common diseases of our time include obesity, type 2 diabetes, hypertension, heart disease, and cancer. The Pritikin Program is recommended and has been proven to help reduce, reverse, and/or prevent the damaging effects of metabolic syndrome.  Nutrition   Overview of the Pritikin Eating Plan  Clinical staff conducted group or individual video education with verbal and written material and guidebook.  Patient learns about the Pritikin Eating Plan for disease risk reduction. The Pritikin Eating Plan emphasizes a wide variety of unrefined, minimally-processed carbohydrates, like fruits, vegetables, whole grains, and legumes. Go, Caution, and Stop food choices are explained. Plant-based and lean animal proteins are emphasized. Rationale provided for low sodium intake for blood pressure control, low added sugars for blood sugar stabilization, and low added fats and oils for coronary artery disease risk reduction and weight management.  Calorie Density  Clinical staff conducted group or individual video education with verbal and written material and guidebook.  Patient learns about calorie density and how it impacts the Pritikin Eating Plan. Knowing the characteristics of the food you choose will help you decide whether those foods will lead to weight gain or weight loss, and whether you want to consume more or less of them. Weight loss is usually a side effect of the Pritikin Eating Plan because of its focus  on  low calorie-dense foods.  Label Reading  Clinical staff conducted group or individual video education with verbal and written material and guidebook.  Patient learns about the Pritikin recommended label reading guidelines and corresponding recommendations regarding calorie density, added sugars, sodium content, and whole grains.  Dining Out - Part 1  Clinical staff conducted group or individual video education with verbal and written material and guidebook.  Patient learns that restaurant meals can be sabotaging because they can be so high in calories, fat, sodium, and/or sugar. Patient learns recommended strategies on how to positively address this and avoid unhealthy pitfalls.  Facts on Fats  Clinical staff conducted group or individual video education with verbal and written material and guidebook.  Patient learns that lifestyle modifications can be just as effective, if not more so, as many medications for lowering your risk of heart disease. A Pritikin lifestyle can help to reduce your risk of inflammation and atherosclerosis (cholesterol build-up, or plaque, in the artery walls). Lifestyle interventions such as dietary choices and physical activity address the cause of atherosclerosis. A review of the types of fats and their impact on blood cholesterol levels, along with dietary recommendations to reduce fat intake is also included.  Nutrition Action Plan  Clinical staff conducted group or individual video education with verbal and written material and guidebook.  Patient learns how to incorporate Pritikin recommendations into their lifestyle. Recommendations include planning and keeping personal health goals in mind as an important part of their success.  Healthy Mind-Set    Healthy Minds, Bodies, Hearts  Clinical staff conducted group or individual video education with verbal and written material and guidebook.  Patient learns how to identify when they are stressed. Video will discuss  the impact of that stress, as well as the many benefits of stress management. Patient will also be introduced to stress management techniques. The way we think, act, and feel has an impact on our hearts.  How Our Thoughts Can Heal Our Hearts  Clinical staff conducted group or individual video education with verbal and written material and guidebook.  Patient learns that negative thoughts can cause depression and anxiety. This can result in negative lifestyle behavior and serious health problems. Cognitive behavioral therapy is an effective method to help control our thoughts in order to change and improve our emotional outlook.  Additional Videos:  Exercise    Improving Performance  Clinical staff conducted group or individual video education with verbal and written material and guidebook.  Patient learns to use a non-linear approach by alternating intensity levels and lengths of time spent exercising to help burn more calories and lose more body fat. Cardiovascular exercise helps improve heart health, metabolism, hormonal balance, blood sugar control, and recovery from fatigue. Resistance training improves strength, endurance, balance, coordination, reaction time, metabolism, and muscle mass. Flexibility exercise improves circulation, posture, and balance. Seek guidance from your physician and exercise physiologist before implementing an exercise routine and learn your capabilities and proper form for all exercise.  Introduction to Yoga  Clinical staff conducted group or individual video education with verbal and written material and guidebook.  Patient learns about yoga, a discipline of the coming together of mind, breath, and body. The benefits of yoga include improved flexibility, improved range of motion, better posture and core strength, increased lung function, weight loss, and positive self-image. Yoga's heart health benefits include lowered blood pressure, healthier heart rate, decreased  cholesterol and triglyceride levels, improved immune function, and reduced stress. Seek guidance from your  physician and exercise physiologist before implementing an exercise routine and learn your capabilities and proper form for all exercise.  Medical   Aging: Enhancing Your Quality of Life  Clinical staff conducted group or individual video education with verbal and written material and guidebook.  Patient learns key strategies and recommendations to stay in good physical health and enhance quality of life, such as prevention strategies, having an advocate, securing a Health Care Proxy and Power of Attorney, and keeping a list of medications and system for tracking them. It also discusses how to avoid risk for bone loss.  Biology of Weight Control  Clinical staff conducted group or individual video education with verbal and written material and guidebook.  Patient learns that weight gain occurs because we consume more calories than we burn (eating more, moving less). Even if your body weight is normal, you may have higher ratios of fat compared to muscle mass. Too much body fat puts you at increased risk for cardiovascular disease, heart attack, stroke, type 2 diabetes, and obesity-related cancers. In addition to exercise, following the Pritikin Eating Plan can help reduce your risk.  Decoding Lab Results  Clinical staff conducted group or individual video education with verbal and written material and guidebook.  Patient learns that lab test reflects one measurement whose values change over time and are influenced by many factors, including medication, stress, sleep, exercise, food, hydration, pre-existing medical conditions, and more. It is recommended to use the knowledge from this video to become more involved with your lab results and evaluate your numbers to speak with your doctor.   Diseases of Our Time - Overview  Clinical staff conducted group or individual video education with verbal  and written material and guidebook.  Patient learns that according to the CDC, 50% to 70% of chronic diseases (such as obesity, type 2 diabetes, elevated lipids, hypertension, and heart disease) are avoidable through lifestyle improvements including healthier food choices, listening to satiety cues, and increased physical activity.  Sleep Disorders Clinical staff conducted group or individual video education with verbal and written material and guidebook.  Patient learns how good quality and duration of sleep are important to overall health and well-being. Patient also learns about sleep disorders and how they impact health along with recommendations to address them, including discussing with a physician.  Nutrition  Dining Out - Part 2 Clinical staff conducted group or individual video education with verbal and written material and guidebook.  Patient learns how to plan ahead and communicate in order to maximize their dining experience in a healthy and nutritious manner. Included are recommended food choices based on the type of restaurant the patient is visiting.   Fueling a Banker conducted group or individual video education with verbal and written material and guidebook.  There is a strong connection between our food choices and our health. Diseases like obesity and type 2 diabetes are very prevalent and are in large-part due to lifestyle choices. The Pritikin Eating Plan provides plenty of food and hunger-curbing satisfaction. It is easy to follow, affordable, and helps reduce health risks.  Menu Workshop  Clinical staff conducted group or individual video education with verbal and written material and guidebook.  Patient learns that restaurant meals can sabotage health goals because they are often packed with calories, fat, sodium, and sugar. Recommendations include strategies to plan ahead and to communicate with the manager, chef, or server to help order a healthier  meal.  Planning Your Eating Strategy  Clinical staff conducted group or individual video education with verbal and written material and guidebook.  Patient learns about the Pritikin Eating Plan and its benefit of reducing the risk of disease. The Pritikin Eating Plan does not focus on calories. Instead, it emphasizes high-quality, nutrient-rich foods. By knowing the characteristics of the foods, we choose, we can determine their calorie density and make informed decisions.  Targeting Your Nutrition Priorities  Clinical staff conducted group or individual video education with verbal and written material and guidebook.  Patient learns that lifestyle habits have a tremendous impact on disease risk and progression. This video provides eating and physical activity recommendations based on your personal health goals, such as reducing LDL cholesterol, losing weight, preventing or controlling type 2 diabetes, and reducing high blood pressure.  Vitamins and Minerals  Clinical staff conducted group or individual video education with verbal and written material and guidebook.  Patient learns different ways to obtain key vitamins and minerals, including through a recommended healthy diet. It is important to discuss all supplements you take with your doctor.   Healthy Mind-Set    Smoking Cessation  Clinical staff conducted group or individual video education with verbal and written material and guidebook.  Patient learns that cigarette smoking and tobacco addiction pose a serious health risk which affects millions of people. Stopping smoking will significantly reduce the risk of heart disease, lung disease, and many forms of cancer. Recommended strategies for quitting are covered, including working with your doctor to develop a successful plan.  Culinary   Becoming a Set designer conducted group or individual video education with verbal and written material and guidebook.  Patient learns  that cooking at home can be healthy, cost-effective, quick, and puts them in control. Keys to cooking healthy recipes will include looking at your recipe, assessing your equipment needs, planning ahead, making it simple, choosing cost-effective seasonal ingredients, and limiting the use of added fats, salts, and sugars.  Cooking - Breakfast and Snacks  Clinical staff conducted group or individual video education with verbal and written material and guidebook.  Patient learns how important breakfast is to satiety and nutrition through the entire day. Recommendations include key foods to eat during breakfast to help stabilize blood sugar levels and to prevent overeating at meals later in the day. Planning ahead is also a key component.  Cooking - Educational psychologist conducted group or individual video education with verbal and written material and guidebook.  Patient learns eating strategies to improve overall health, including an approach to cook more at home. Recommendations include thinking of animal protein as a side on your plate rather than center stage and focusing instead on lower calorie dense options like vegetables, fruits, whole grains, and plant-based proteins, such as beans. Making sauces in large quantities to freeze for later and leaving the skin on your vegetables are also recommended to maximize your experience.  Cooking - Healthy Salads and Dressing Clinical staff conducted group or individual video education with verbal and written material and guidebook.  Patient learns that vegetables, fruits, whole grains, and legumes are the foundations of the Pritikin Eating Plan. Recommendations include how to incorporate each of these in flavorful and healthy salads, and how to create homemade salad dressings. Proper handling of ingredients is also covered. Cooking - Soups and [employer] - Soups and Desserts Clinical staff conducted group or individual video education with  verbal and written material and guidebook.  Patient learns that Pritikin  soups and desserts make for easy, nutritious, and delicious snacks and meal components that are low in sodium, fat, sugar, and calorie density, while high in vitamins, minerals, and filling fiber. Recommendations include simple and healthy ideas for soups and desserts.   Overview     The Pritikin Solution Program Overview Clinical staff conducted group or individual video education with verbal and written material and guidebook.  Patient learns that the results of the Pritikin Program have been documented in more than 100 articles published in peer-reviewed journals, and the benefits include reducing risk factors for (and, in some cases, even reversing) high cholesterol, high blood pressure, type 2 diabetes, obesity, and more! An overview of the three key pillars of the Pritikin Program will be covered: eating well, doing regular exercise, and having a healthy mind-set.  WORKSHOPS  Exercise: Exercise Basics: Building Your Action Plan Clinical staff led group instruction and group discussion with PowerPoint presentation and patient guidebook. To enhance the learning environment the use of posters, models and videos may be added. At the conclusion of this workshop, patients will comprehend the difference between physical activity and exercise, as well as the benefits of incorporating both, into their routine. Patients will understand the FITT (Frequency, Intensity, Time, and Type) principle and how to use it to build an exercise action plan. In addition, safety concerns and other considerations for exercise and cardiac rehab will be addressed by the presenter. The purpose of this lesson is to promote a comprehensive and effective weekly exercise routine in order to improve patients' overall level of fitness.   Managing Heart Disease: Your Path to a Healthier Heart Clinical staff led group instruction and group discussion with  PowerPoint presentation and patient guidebook. To enhance the learning environment the use of posters, models and videos may be added.At the conclusion of this workshop, patients will understand the anatomy and physiology of the heart. Additionally, they will understand how Pritikin's three pillars impact the risk factors, the progression, and the management of heart disease.  The purpose of this lesson is to provide a high-level overview of the heart, heart disease, and how the Pritikin lifestyle positively impacts risk factors.  Exercise Biomechanics Clinical staff led group instruction and group discussion with PowerPoint presentation and patient guidebook. To enhance the learning environment the use of posters, models and videos may be added. Patients will learn how the structural parts of their bodies function and how these functions impact their daily activities, movement, and exercise. Patients will learn how to promote a neutral spine, learn how to manage pain, and identify ways to improve their physical movement in order to promote healthy living. The purpose of this lesson is to expose patients to common physical limitations that impact physical activity. Participants will learn practical ways to adapt and manage aches and pains, and to minimize their effect on regular exercise. Patients will learn how to maintain good posture while sitting, walking, and lifting.  Balance Training and Fall Prevention  Clinical staff led group instruction and group discussion with PowerPoint presentation and patient guidebook. To enhance the learning environment the use of posters, models and videos may be added. At the conclusion of this workshop, patients will understand the importance of their sensorimotor skills (vision, proprioception, and the vestibular system) in maintaining their ability to balance as they age. Patients will apply a variety of balancing exercises that are appropriate for their  current level of function. Patients will understand the common causes for poor balance, possible solutions to these  problems, and ways to modify their physical environment in order to minimize their fall risk. The purpose of this lesson is to teach patients about the importance of maintaining balance as they age and ways to minimize their risk of falling.  WORKSHOPS   Nutrition:  Fueling a Ship broker led group instruction and group discussion with PowerPoint presentation and patient guidebook. To enhance the learning environment the use of posters, models and videos may be added. Patients will review the foundational principles of the Pritikin Eating Plan and understand what constitutes a serving size in each of the food groups. Patients will also learn Pritikin-friendly foods that are better choices when away from home and review make-ahead meal and snack options. Calorie density will be reviewed and applied to three nutrition priorities: weight maintenance, weight loss, and weight gain. The purpose of this lesson is to reinforce (in a group setting) the key concepts around what patients are recommended to eat and how to apply these guidelines when away from home by planning and selecting Pritikin-friendly options. Patients will understand how calorie density may be adjusted for different weight management goals.  Mindful Eating  Clinical staff led group instruction and group discussion with PowerPoint presentation and patient guidebook. To enhance the learning environment the use of posters, models and videos may be added. Patients will briefly review the concepts of the Pritikin Eating Plan and the importance of low-calorie dense foods. The concept of mindful eating will be introduced as well as the importance of paying attention to internal hunger signals. Triggers for non-hunger eating and techniques for dealing with triggers will be explored. The purpose of this lesson is to provide  patients with the opportunity to review the basic principles of the Pritikin Eating Plan, discuss the value of eating mindfully and how to measure internal cues of hunger and fullness using the Hunger Scale. Patients will also discuss reasons for non-hunger eating and learn strategies to use for controlling emotional eating.  Targeting Your Nutrition Priorities Clinical staff led group instruction and group discussion with PowerPoint presentation and patient guidebook. To enhance the learning environment the use of posters, models and videos may be added. Patients will learn how to determine their genetic susceptibility to disease by reviewing their family history. Patients will gain insight into the importance of diet as part of an overall healthy lifestyle in mitigating the impact of genetics and other environmental insults. The purpose of this lesson is to provide patients with the opportunity to assess their personal nutrition priorities by looking at their family history, their own health history and current risk factors. Patients will also be able to discuss ways of prioritizing and modifying the Pritikin Eating Plan for their highest risk areas  Menu  Clinical staff led group instruction and group discussion with PowerPoint presentation and patient guidebook. To enhance the learning environment the use of posters, models and videos may be added. Using menus brought in from [employer], or printed from [employer], patients will apply the Pritikin dining out guidelines that were presented in the [employer] video. Patients will also be able to practice these guidelines in a variety of provided scenarios. The purpose of this lesson is to provide patients with the opportunity to practice hands-on learning of the Pritikin Dining Out guidelines with actual menus and practice scenarios.  Label Reading Clinical staff led group instruction and group discussion with PowerPoint  presentation and patient guidebook. To enhance the learning environment the use of posters,  models and videos may be added. Patients will review and discuss the Pritikin label reading guidelines presented in Pritikin's Label Reading Educational series video. Using fool labels brought in from local grocery stores and markets, patients will apply the label reading guidelines and determine if the packaged food meet the Pritikin guidelines. The purpose of this lesson is to provide patients with the opportunity to review, discuss, and practice hands-on learning of the Pritikin Label Reading guidelines with actual packaged food labels. Cooking School  Pritikin's [employer] are designed to teach patients ways to prepare quick, simple, and affordable recipes at home. The importance of nutrition's role in chronic disease risk reduction is reflected in its emphasis in the overall Pritikin program. By learning how to prepare essential core Pritikin Eating Plan recipes, patients will increase control over what they eat; be able to customize the flavor of foods without the use of added salt, sugar, or fat; and improve the quality of the food they consume. By learning a set of core recipes which are easily assembled, quickly prepared, and affordable, patients are more likely to prepare more healthy foods at home. These workshops focus on convenient breakfasts, simple entres, side dishes, and desserts which can be prepared with minimal effort and are consistent with nutrition recommendations for cardiovascular risk reduction. Cooking [employer] are taught by a Armed forces logistics/support/administrative officer (RD) who has been trained by the [employer]. The chef or RD has a clear understanding of the importance of minimizing - if not completely eliminating - added fat, sugar, and sodium in recipes. Throughout the series of Cooking School Workshop sessions, patients will learn about healthy ingredients and  efficient methods of cooking to build confidence in their capability to prepare    Cooking School weekly topics:  Adding Flavor- Sodium-Free  Fast and Healthy Breakfasts  Powerhouse Plant-Based Proteins  Satisfying Salads and Dressings  Simple Sides and Sauces  International Cuisine-Spotlight on the [employer] Zones  Delicious Desserts  Savory Soups  [employer] - Meals in a Astronomer Appetizers and Snacks  Comforting Weekend Breakfasts  One-Pot Wonders   Fast Evening Meals  Landscape architect Your Pritikin Plate  WORKSHOPS   Healthy Mindset (Psychosocial):  Focused Goals, Sustainable Changes Clinical staff led group instruction and group discussion with PowerPoint presentation and patient guidebook. To enhance the learning environment the use of posters, models and videos may be added. Patients will be able to apply effective goal setting strategies to establish at least one personal goal, and then take consistent, meaningful action toward that goal. They will learn to identify common barriers to achieving personal goals and develop strategies to overcome them. Patients will also gain an understanding of how our mind-set can impact our ability to achieve goals and the importance of cultivating a positive and growth-oriented mind-set. The purpose of this lesson is to provide patients with a deeper understanding of how to set and achieve personal goals, as well as the tools and strategies needed to overcome common obstacles which may arise along the way.  From Head to Heart: The Power of a Healthy Outlook  Clinical staff led group instruction and group discussion with PowerPoint presentation and patient guidebook. To enhance the learning environment the use of posters, models and videos may be added. Patients will be able to recognize and describe the impact of emotions and mood on physical health. They will discover the importance of self-care and explore self-care  practices which may work for  them. Patients will also learn how to utilize the 4 C's to cultivate a healthier outlook and better manage stress and challenges. The purpose of this lesson is to demonstrate to patients how a healthy outlook is an essential part of maintaining good health, especially as they continue their cardiac rehab journey.  Healthy Sleep for a Healthy Heart Clinical staff led group instruction and group discussion with PowerPoint presentation and patient guidebook. To enhance the learning environment the use of posters, models and videos may be added. At the conclusion of this workshop, patients will be able to demonstrate knowledge of the importance of sleep to overall health, well-being, and quality of life. They will understand the symptoms of, and treatments for, common sleep disorders. Patients will also be able to identify daytime and nighttime behaviors which impact sleep, and they will be able to apply these tools to help manage sleep-related challenges. The purpose of this lesson is to provide patients with a general overview of sleep and outline the importance of quality sleep. Patients will learn about a few of the most common sleep disorders. Patients will also be introduced to the concept of "sleep hygiene," and discover ways to self-manage certain sleeping problems through simple daily behavior changes. Finally, the workshop will motivate patients by clarifying the links between quality sleep and their goals of heart-healthy living.   Recognizing and Reducing Stress Clinical staff led group instruction and group discussion with PowerPoint presentation and patient guidebook. To enhance the learning environment the use of posters, models and videos may be added. At the conclusion of this workshop, patients will be able to understand the types of stress reactions, differentiate between acute and chronic stress, and recognize the impact that chronic stress has on their health. They  will also be able to apply different coping mechanisms, such as reframing negative self-talk. Patients will have the opportunity to practice a variety of stress management techniques, such as deep abdominal breathing, progressive muscle relaxation, and/or guided imagery.  The purpose of this lesson is to educate patients on the role of stress in their lives and to provide healthy techniques for coping with it.  Learning Barriers/Preferences:  Learning Barriers/Preferences - 06/03/23 1106       Learning Barriers/Preferences   Learning Barriers Sight;Hearing    Learning Preferences Computer/Internet;Group Instruction;Individual Instruction;Pictoral;Skilled Demonstration;Video             Education Topics:  Knowledge Questionnaire Score:  Knowledge Questionnaire Score - 06/03/23 1107       Knowledge Questionnaire Score   Pre Score 19/24             Core Components/Risk Factors/Patient Goals at Admission:  Personal Goals and Risk Factors at Admission - 06/03/23 1041       Core Components/Risk Factors/Patient Goals on Admission   Lipids Yes    Intervention Provide education and support for participant on nutrition & aerobic/resistive exercise along with prescribed medications to achieve LDL 70mg , HDL >40mg .    Expected Outcomes Short Term: Participant states understanding of desired cholesterol values and is compliant with medications prescribed. Participant is following exercise prescription and nutrition guidelines.;Long Term: Cholesterol controlled with medications as prescribed, with individualized exercise RX and with personalized nutrition plan. Value goals: LDL < 70mg , HDL > 40 mg.    Stress Yes    Intervention Offer individual and/or small group education and counseling on adjustment to heart disease, stress management and health-related lifestyle change. Teach and support self-help strategies.;Refer participants experiencing significant psychosocial distress to  appropriate mental health specialists for further evaluation and treatment. When possible, include family members and significant others in education/counseling sessions.    Expected Outcomes Long Term: Emotional wellbeing is indicated by absence of clinically significant psychosocial distress or social isolation.;Short Term: Participant demonstrates changes in health-related behavior, relaxation and other stress management skills, ability to obtain effective social support, and compliance with psychotropic medications if prescribed.             Core Components/Risk Factors/Patient Goals Review:   Goals and Risk Factor Review     Row Name 06/09/23 0919 06/18/23 1610 07/15/23 0925         Core Components/Risk Factors/Patient Goals Review   Personal Goals Review Lipids;Stress Lipids;Stress Lipids;Stress     Review Seve started cardiac rehab on 06/09/23 and did well with exercise. Vital signs were stable Drexler started cardiac rehab on 06/09/23 and is off to a good start to exercise.  Vital signs have been  stable Vinson is doing very well with exercise.  Vital signs have been  stable. Jaland is now jogging and ask asked for target hear rate increases as Ryle wants to participate in triathalons again. Grame has had no reports of angina with exercise.     Expected Outcomes Nikoloz will continue to participate in intensive cardiac rehab for exercise, nutrition and lifestyle modifications Hary will continue to participate in intensive cardiac rehab for exercise, nutrition and lifestyle modifications Kyzer will continue to participate in intensive cardiac rehab for exercise, nutrition and lifestyle modifications              Core Components/Risk Factors/Patient Goals at Discharge (Final Review):   Goals and Risk Factor Review - 07/15/23 0925       Core Components/Risk Factors/Patient Goals Review   Personal Goals Review Lipids;Stress    Review Treighton is doing very well with exercise.   Vital signs have been  stable. Golden is now jogging and ask asked for target hear rate increases as Idris wants to participate in triathalons again. Grame has had no reports of angina with exercise.    Expected Outcomes Harfateh will continue to participate in intensive cardiac rehab for exercise, nutrition and lifestyle modifications             ITP Comments:  ITP Comments     Row Name 06/03/23 0926 06/09/23 0916 06/18/23 0831 07/15/23 0920     ITP Comments Armanda Magic, MD: Medical Director.  Introduction to the Pritikin Education Program/Intensive Cardiac Rehab.  Initial orientation packet reviewed with the patient. 30 day ITP Review. Traiden started intensive cardiac rehab on 06/09/23 and did well with exercise. 30 day ITP Review. Luian started intensive cardiac rehab on 06/09/23 and is off to a good start to exercise 30 day ITP Review. Azai has good attendance and participation  in  cardiac rehab             Comments: See ITP comments

## 2023-07-15 NOTE — Telephone Encounter (Signed)
-----   Message from Paul [name] sent at 07/14/2023  8:55 AM EDT ----- Regarding: RE: THRR increase OK to increase Thanks ----- Message ----- From: Paul [name] Sent: 07/11/2023  11:04 AM EDT To: Paul Bathe, MD; Paul Copa, RN Subject: THRR increase                                  Good morning Dr. Anne Fu,   I am again requesting a THRR increase for [name]. 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 He continues to feel limited by the recent increase in the THR to 125 (85%) and rates his exercise at 9 - 11 (Very light/fairly light) on the Borg scale. He voices that since he was started on the [employer] he has not experienced angina with exercise and has had no issues with the recent increase in THRR to 85%. I would like to request an increase in the THR to 90% of is MPH ( which is 132). I have staged this request as to monitor his response and he may still feel limited at 90%. Thank you for considering this request. Please let me know if you require any additional information.   Best Regards,   Paul Picket MS, ACSM-CEP, CCRP

## 2023-07-16 ENCOUNTER — Encounter (HOSPITAL_COMMUNITY)
Admission: RE | Admit: 2023-07-16 | Discharge: 2023-07-16 | Disposition: A | Discharge status: Home / Self Care | Payer: Medicare Other | Source: Ambulatory Visit | Attending: Cardiology | Admitting: Cardiology

## 2023-07-16 DIAGNOSIS — Z48812 Encounter for surgical aftercare following surgery on the circulatory system: Secondary | ICD-10-CM | POA: Diagnosis not present

## 2023-07-16 DIAGNOSIS — I214 Non-ST elevation (NSTEMI) myocardial infarction: Secondary | ICD-10-CM | POA: Diagnosis not present

## 2023-07-16 DIAGNOSIS — Z955 Presence of coronary angioplasty implant and graft: Secondary | ICD-10-CM | POA: Diagnosis not present

## 2023-07-17 DIAGNOSIS — M25611 Stiffness of right shoulder, not elsewhere classified: Secondary | ICD-10-CM | POA: Diagnosis not present

## 2023-07-17 DIAGNOSIS — M5412 Radiculopathy, cervical region: Secondary | ICD-10-CM | POA: Diagnosis not present

## 2023-07-17 DIAGNOSIS — M7541 Impingement syndrome of right shoulder: Secondary | ICD-10-CM | POA: Diagnosis not present

## 2023-07-17 DIAGNOSIS — M25511 Pain in right shoulder: Secondary | ICD-10-CM | POA: Diagnosis not present

## 2023-07-17 DIAGNOSIS — R293 Abnormal posture: Secondary | ICD-10-CM | POA: Diagnosis not present

## 2023-07-18 ENCOUNTER — Encounter (HOSPITAL_COMMUNITY): Admission: RE | Admit: 2023-07-18 | Payer: Medicare Other | Source: Ambulatory Visit

## 2023-07-18 ENCOUNTER — Encounter: Payer: Self-pay | Admitting: Cardiology

## 2023-07-18 DIAGNOSIS — Z955 Presence of coronary angioplasty implant and graft: Secondary | ICD-10-CM | POA: Diagnosis not present

## 2023-07-18 DIAGNOSIS — I214 Non-ST elevation (NSTEMI) myocardial infarction: Secondary | ICD-10-CM

## 2023-07-18 DIAGNOSIS — Z48812 Encounter for surgical aftercare following surgery on the circulatory system: Secondary | ICD-10-CM | POA: Diagnosis not present

## 2023-07-22 ENCOUNTER — Telehealth (HOSPITAL_COMMUNITY): Payer: Self-pay | Admitting: *Deleted

## 2023-07-22 DIAGNOSIS — U071 COVID-19: Secondary | ICD-10-CM | POA: Diagnosis not present

## 2023-07-22 NOTE — Telephone Encounter (Signed)
Spoke with Paul [name] he has tested positive for COVID 19 on 07/21/23. Asked That [name] not attend cardiac rehab until 08/01/23 providing that he is symptom free upon his return. Patient states understanding. Will cancel appointments until that time.Thayer Headings RN BSN

## 2023-07-22 NOTE — Progress Notes (Signed)
[facility] [address] [place] [address] Phone: [phone] Subjective:   Bruce Donath, am serving as a scribe for Dr. Antoine Primas.  I'm seeing this patient by the request  of:  Noberto Retort, MD  CC: Neck and right shoulder pain follow-up  [id number]  06/25/2023 Chronic right shoulder pain.  Given injection today, tolerated the procedure well, discussed icing regimen and home exercises.  Increase activity slowly otherwise.  Will follow-up again in 6 to 8 weeks likely still do not move secondary to cervical radiculopathy.     Will monitor at this point.  Patient does think he is making some improvement.     Significant arthritic changes noted.  Discussed with patient about his shoulder as well.  Given an injection of the shoulder to see if this will make any improvement.  With patient still being on the blood thinner is not a candidate for the epidural.  Will increase Cymbalta to 60 mg and warned of potential side effects.  Follow-up with me again in 2 months otherwise.     Updated 07/29/2023 AHYAN MOSBRUCKER is a 72 y.o. male coming in with complaint of neck and R shoulder pain. Injection and PT have been helpful. Has not been able to swim due to pain with flexion. Pain in [facility] joint today. Still unable to get epidural.  Also having L knee pain with running in lateral gastroc.   For past 2-3 months, R nipple is itchy with and without a shirt. No recent change in body soap or laundry detergent.   Had COVID 10 days ago. Took paxlovid. Feeling better on bike today.          Past Medical History:  Diagnosis Date   Cataract    Depression    RESOLVED   Hyperlipidemia    Spinal stenosis    WAS HAVING NECK PAIN -PT STATES NERVE ABLATION PROCEDURE 2012 AND PAIN HAS RESOLVED   Spondylitis (HCC)    Past Surgical History:  Procedure Laterality Date   APPENDECTOMY  1962   CORONARY PRESSURE/FFR STUDY N/A 05/12/2023   Procedure:  CORONARY PRESSURE/FFR STUDY;  Surgeon: Marykay Lex, MD;  Location: [facility] INVASIVE CV LAB;  Service: Cardiovascular;  Laterality: N/A;   CORONARY STENT INTERVENTION N/A 05/12/2023   Procedure: CORONARY STENT INTERVENTION;  Surgeon: Marykay Lex, MD;  Location: [facility] INVASIVE CV LAB;  Service: Cardiovascular;  Laterality: N/A;   HERNIA REPAIR     INSERTION OF MESH  10/06/2012   Procedure: INSERTION OF MESH;  Surgeon: [name]. Corliss Skains, MD;  Location: [facility];  Service: General;  Laterality: N/A;   KNEE SURGERY  1980 & 1982   left   LEFT HEART CATH AND CORONARY ANGIOGRAPHY N/A 05/12/2023   Procedure: LEFT HEART CATH AND CORONARY ANGIOGRAPHY;  Surgeon: Marykay Lex, MD;  Location: [facility] INVASIVE CV LAB;  Service: Cardiovascular;  Laterality: N/A;   TONSILLECTOMY  1968   UMBILICAL HERNIA REPAIR  10/06/2012   Procedure: HERNIA REPAIR UMBILICAL ADULT;  Surgeon: [name]. Corliss Skains, MD;  Location: [facility];  Service: General;  Laterality: N/A;   Social History   Socioeconomic History   Marital status: Married    Spouse name: Not on file   Number of children: Not on file   Years of education: Not on file   Highest education level: Not on file  Occupational History   Not on file  Tobacco Use   Smoking status: Never  Smokeless tobacco: Never  Vaping Use   Vaping status: Never Used  Substance and Sexual Activity   Alcohol use: Yes    Comment: OCCAS ALCOHOL   Drug use: No   Sexual activity: Not on file  Other Topics Concern   Not on file  Social History Narrative   ** Merged History Encounter **       Social Determinants of Health   Financial Resource Strain: Not on file  Food Insecurity: No Food Insecurity (05/10/2023)   Hunger Vital Sign    Worried About Running Out of Food in the Last Year: Never true    Ran Out of Food in the Last Year: Never true  Transportation Needs: No Transportation Needs (05/10/2023)   PRAPARE - Administrator, Civil Service (Medical): No    Lack  of Transportation (Non-Medical): No  Physical Activity: Not on file  Stress: Not on file  Social Connections: Not on file   Allergies  Allergen Reactions   Crestor [Rosuvastatin] Other (See Comments)    Myalgias  Pt currently taking 20mg  QD   Lipitor [Atorvastatin] Other (See Comments)    Liver toxicity   Family History  Problem Relation Age of Onset   Stroke Mother    Heart disease Father    Cancer Sister        bone     Current Outpatient Medications (Cardiovascular):    isosorbide mononitrate (IMDUR) 30 MG 24 hr tablet, Take 1 tablet (30 mg total) by mouth daily.   nitroGLYCERIN (NITROSTAT) 0.4 MG SL tablet, Place 1 tablet (0.4 mg total) under the tongue every 5 (five) minutes x 3 doses as needed for chest pain.   rosuvastatin (CRESTOR) 20 MG tablet, Take 1 tablet (20 mg total) by mouth daily.   sildenafil (VIAGRA) 100 MG tablet, Take 100 mg by mouth daily as needed for erectile dysfunction.   Current Outpatient Medications (Analgesics):    aspirin EC 81 MG tablet, Take 1 tablet (81 mg total) by mouth daily. Swallow whole.  Current Outpatient Medications (Hematological):    prasugrel (EFFIENT) 10 MG TABS tablet, Take 6 tablets by mouth 24 hours after last Brilinta dose. Then 24 hours later take 1 tablet (10mg ) by mouth daily.   ticagrelor (BRILINTA) 90 MG TABS tablet, Take by mouth 2 (two) times daily.  Current Outpatient Medications (Other):    DULoxetine (CYMBALTA) 60 MG capsule, Take 1 capsule (60 mg total) by mouth daily.   gabapentin (NEURONTIN) 300 MG capsule, Take 1 capsule (300 mg total) by mouth 2 (two) times daily.   Vitamin D, Ergocalciferol, (DRISDOL) 1.25 MG (50000 UNIT) CAPS capsule, TAKE 1 CAPSULE (50,000 UNITS TOTAL) BY MOUTH EVERY 7 (SEVEN) DAYS   DULoxetine (CYMBALTA) 20 MG capsule, TAKE 2 CAPSULES BY MOUTH DAILY   Reviewed prior external information including notes and imaging from  primary care provider As well as notes that were available from care  everywhere and other healthcare systems.  Past medical history, social, surgical and family history all reviewed in electronic medical record.  No pertanent information unless stated regarding to the chief complaint.   Review of Systems:  No headache, visual changes, nausea, vomiting, diarrhea, constipation, dizziness, abdominal pain, skin rash, fevers, chills, night sweats, weight loss, swollen lymph nodes, body aches, joint swelling, chest pain, shortness of breath, mood changes. POSITIVE muscle aches  Objective  Blood pressure 120/64, pulse (!) 59, height 5\' 11"  (1.803 m), weight 172 lb (78 kg), SpO2 97%.   General: No  apparent distress alert and oriented x3 mood and affect normal, dressed appropriately.  HEENT: Pupils equal, extraocular movements intact  Respiratory: Patient's speak in full sentences and does not appear short of breath  Cardiovascular: No lower extremity edema, non tender, no erythema  Patient's shoulders do show some mild positive impingement noted.  Patient's neck does have some limited range of motion.  Left knee does have significant swelling noted of the patellofemoral joint.  Instability noted.  After informed written and verbal consent, patient was seated on exam table. Left knee was prepped with alcohol swab and utilizing anterolateral approach, patient's left knee space was injected with 4:1  marcaine 0.5%: Kenalog 40mg /dL. Patient tolerated the procedure well without immediate complications.  Osteopathic findings C2 flexed rotated and side bent right C4 flexed rotated and side bent left C6 flexed rotated and side bent left T3 extended rotated and side bent right inhaled third rib T9 extended rotated and side bent left L2 flexed rotated and side bent right Sacrum right on right     Impression and Recommendations:     The above documentation has been reviewed and is accurate and complete Judi Saa, DO

## 2023-07-23 ENCOUNTER — Encounter (HOSPITAL_COMMUNITY): Payer: Medicare Other

## 2023-07-25 ENCOUNTER — Encounter (HOSPITAL_COMMUNITY): Payer: Medicare Other

## 2023-07-28 ENCOUNTER — Encounter (HOSPITAL_COMMUNITY): Payer: Medicare Other

## 2023-07-29 ENCOUNTER — Other Ambulatory Visit: Payer: Self-pay

## 2023-07-29 ENCOUNTER — Ambulatory Visit (INDEPENDENT_AMBULATORY_CARE_PROVIDER_SITE_OTHER): Payer: Medicare Other | Admitting: Family Medicine

## 2023-07-29 ENCOUNTER — Encounter: Payer: Self-pay | Admitting: Family Medicine

## 2023-07-29 VITALS — BP 120/64 | HR 59 | Ht 71.0 in | Wt 172.0 lb

## 2023-07-29 DIAGNOSIS — M1712 Unilateral primary osteoarthritis, left knee: Secondary | ICD-10-CM | POA: Diagnosis not present

## 2023-07-29 DIAGNOSIS — M9904 Segmental and somatic dysfunction of sacral region: Secondary | ICD-10-CM

## 2023-07-29 DIAGNOSIS — M9902 Segmental and somatic dysfunction of thoracic region: Secondary | ICD-10-CM | POA: Diagnosis not present

## 2023-07-29 DIAGNOSIS — M25511 Pain in right shoulder: Secondary | ICD-10-CM

## 2023-07-29 DIAGNOSIS — M9908 Segmental and somatic dysfunction of rib cage: Secondary | ICD-10-CM | POA: Diagnosis not present

## 2023-07-29 DIAGNOSIS — R293 Abnormal posture: Secondary | ICD-10-CM | POA: Diagnosis not present

## 2023-07-29 DIAGNOSIS — M533 Sacrococcygeal disorders, not elsewhere classified: Secondary | ICD-10-CM

## 2023-07-29 DIAGNOSIS — M9903 Segmental and somatic dysfunction of lumbar region: Secondary | ICD-10-CM

## 2023-07-29 DIAGNOSIS — M5412 Radiculopathy, cervical region: Secondary | ICD-10-CM | POA: Diagnosis not present

## 2023-07-29 DIAGNOSIS — M25611 Stiffness of right shoulder, not elsewhere classified: Secondary | ICD-10-CM | POA: Diagnosis not present

## 2023-07-29 DIAGNOSIS — M7541 Impingement syndrome of right shoulder: Secondary | ICD-10-CM | POA: Diagnosis not present

## 2023-07-29 DIAGNOSIS — M9901 Segmental and somatic dysfunction of cervical region: Secondary | ICD-10-CM | POA: Diagnosis not present

## 2023-07-29 NOTE — Patient Instructions (Addendum)
Injected knee today Will hold on shoulder injection Let's get an MRI [phone] See me again in 5-6 weeks

## 2023-07-29 NOTE — Assessment & Plan Note (Signed)

## 2023-07-29 NOTE — Assessment & Plan Note (Signed)
Patient is going to start increasing activity and start training again in the near future.  Patient is hoping that he will be released so he can get the epidural in his neck.  Discussed with patient about icing regimen and home exercises otherwise.  Increase activity slowly.  Follow-up again in 6 to 8 weeks otherwise.

## 2023-07-29 NOTE — Assessment & Plan Note (Signed)
Discussed icing regimen and home exercises, which activities to do and which ones to avoid.  Increasing activity slowly.

## 2023-07-30 ENCOUNTER — Encounter (HOSPITAL_COMMUNITY): Payer: Medicare Other

## 2023-07-31 ENCOUNTER — Encounter: Payer: Self-pay | Admitting: Family Medicine

## 2023-07-31 ENCOUNTER — Ambulatory Visit
Admission: RE | Admit: 2023-07-31 | Discharge: 2023-07-31 | Disposition: A | Discharge status: Home / Self Care | Payer: Medicare Other | Source: Ambulatory Visit | Attending: Family Medicine | Admitting: Family Medicine

## 2023-07-31 DIAGNOSIS — M25611 Stiffness of right shoulder, not elsewhere classified: Secondary | ICD-10-CM | POA: Diagnosis not present

## 2023-07-31 DIAGNOSIS — M25511 Pain in right shoulder: Secondary | ICD-10-CM

## 2023-07-31 DIAGNOSIS — R293 Abnormal posture: Secondary | ICD-10-CM | POA: Diagnosis not present

## 2023-07-31 DIAGNOSIS — M7541 Impingement syndrome of right shoulder: Secondary | ICD-10-CM | POA: Diagnosis not present

## 2023-07-31 DIAGNOSIS — M7581 Other shoulder lesions, right shoulder: Secondary | ICD-10-CM | POA: Diagnosis not present

## 2023-07-31 DIAGNOSIS — G8929 Other chronic pain: Secondary | ICD-10-CM | POA: Diagnosis not present

## 2023-07-31 DIAGNOSIS — M5412 Radiculopathy, cervical region: Secondary | ICD-10-CM | POA: Diagnosis not present

## 2023-08-01 ENCOUNTER — Encounter (HOSPITAL_COMMUNITY)
Admission: RE | Admit: 2023-08-01 | Discharge: 2023-08-01 | Disposition: A | Discharge status: Home / Self Care | Payer: Medicare Other | Source: Ambulatory Visit | Attending: Cardiology | Admitting: Cardiology

## 2023-08-01 DIAGNOSIS — I214 Non-ST elevation (NSTEMI) myocardial infarction: Secondary | ICD-10-CM | POA: Diagnosis not present

## 2023-08-01 DIAGNOSIS — Z955 Presence of coronary angioplasty implant and graft: Secondary | ICD-10-CM | POA: Diagnosis not present

## 2023-08-04 ENCOUNTER — Encounter (HOSPITAL_COMMUNITY)
Admission: RE | Admit: 2023-08-04 | Discharge: 2023-08-04 | Disposition: A | Discharge status: Home / Self Care | Payer: Medicare Other | Source: Ambulatory Visit | Attending: Cardiology

## 2023-08-04 DIAGNOSIS — I214 Non-ST elevation (NSTEMI) myocardial infarction: Secondary | ICD-10-CM

## 2023-08-04 DIAGNOSIS — Z955 Presence of coronary angioplasty implant and graft: Secondary | ICD-10-CM

## 2023-08-05 DIAGNOSIS — M25611 Stiffness of right shoulder, not elsewhere classified: Secondary | ICD-10-CM | POA: Diagnosis not present

## 2023-08-05 DIAGNOSIS — M5412 Radiculopathy, cervical region: Secondary | ICD-10-CM | POA: Diagnosis not present

## 2023-08-05 DIAGNOSIS — M25511 Pain in right shoulder: Secondary | ICD-10-CM | POA: Diagnosis not present

## 2023-08-05 DIAGNOSIS — R293 Abnormal posture: Secondary | ICD-10-CM | POA: Diagnosis not present

## 2023-08-05 DIAGNOSIS — M7541 Impingement syndrome of right shoulder: Secondary | ICD-10-CM | POA: Diagnosis not present

## 2023-08-06 ENCOUNTER — Encounter (HOSPITAL_COMMUNITY)
Admission: RE | Admit: 2023-08-06 | Discharge: 2023-08-06 | Disposition: A | Discharge status: Home / Self Care | Payer: Medicare Other | Source: Ambulatory Visit | Attending: Cardiology

## 2023-08-06 DIAGNOSIS — I214 Non-ST elevation (NSTEMI) myocardial infarction: Secondary | ICD-10-CM | POA: Diagnosis not present

## 2023-08-06 DIAGNOSIS — Z955 Presence of coronary angioplasty implant and graft: Secondary | ICD-10-CM

## 2023-08-07 DIAGNOSIS — M5412 Radiculopathy, cervical region: Secondary | ICD-10-CM | POA: Diagnosis not present

## 2023-08-07 DIAGNOSIS — M7541 Impingement syndrome of right shoulder: Secondary | ICD-10-CM | POA: Diagnosis not present

## 2023-08-07 DIAGNOSIS — M25611 Stiffness of right shoulder, not elsewhere classified: Secondary | ICD-10-CM | POA: Diagnosis not present

## 2023-08-07 DIAGNOSIS — M25511 Pain in right shoulder: Secondary | ICD-10-CM | POA: Diagnosis not present

## 2023-08-07 DIAGNOSIS — R293 Abnormal posture: Secondary | ICD-10-CM | POA: Diagnosis not present

## 2023-08-08 ENCOUNTER — Encounter (HOSPITAL_COMMUNITY)
Admission: RE | Admit: 2023-08-08 | Discharge: 2023-08-08 | Disposition: A | Discharge status: Home / Self Care | Payer: Medicare Other | Source: Ambulatory Visit | Attending: Cardiology | Admitting: Cardiology

## 2023-08-08 DIAGNOSIS — I214 Non-ST elevation (NSTEMI) myocardial infarction: Secondary | ICD-10-CM | POA: Diagnosis not present

## 2023-08-08 DIAGNOSIS — Z955 Presence of coronary angioplasty implant and graft: Secondary | ICD-10-CM | POA: Diagnosis not present

## 2023-08-11 ENCOUNTER — Encounter (HOSPITAL_COMMUNITY)
Admission: RE | Admit: 2023-08-11 | Discharge: 2023-08-11 | Disposition: A | Discharge status: Home / Self Care | Payer: Medicare Other | Source: Ambulatory Visit | Attending: Cardiology | Admitting: Cardiology

## 2023-08-11 DIAGNOSIS — Z955 Presence of coronary angioplasty implant and graft: Secondary | ICD-10-CM

## 2023-08-11 DIAGNOSIS — I214 Non-ST elevation (NSTEMI) myocardial infarction: Secondary | ICD-10-CM

## 2023-08-12 DIAGNOSIS — M7541 Impingement syndrome of right shoulder: Secondary | ICD-10-CM | POA: Diagnosis not present

## 2023-08-12 DIAGNOSIS — M25511 Pain in right shoulder: Secondary | ICD-10-CM | POA: Diagnosis not present

## 2023-08-12 DIAGNOSIS — R293 Abnormal posture: Secondary | ICD-10-CM | POA: Diagnosis not present

## 2023-08-12 DIAGNOSIS — M5412 Radiculopathy, cervical region: Secondary | ICD-10-CM | POA: Diagnosis not present

## 2023-08-12 DIAGNOSIS — M25611 Stiffness of right shoulder, not elsewhere classified: Secondary | ICD-10-CM | POA: Diagnosis not present

## 2023-08-12 NOTE — Progress Notes (Signed)
Cardiac Individual Treatment Plan  Patient Details  Name: KWASI LEEDS MRN: [id number] Date of Birth: [birth year] Referring Provider:   Flowsheet Row INTENSIVE CARDIAC REHAB ORIENT from 06/03/2023 in [facility]  Referring Provider Donato Schultz, MD       Initial Encounter Date:  Flowsheet Row INTENSIVE CARDIAC REHAB ORIENT from 06/03/2023 in [facility]  Date 06/03/23       Visit Diagnosis: 05/10/23 NSTEMI (non-ST elevated myocardial infarction) (HCC)  05/12/23 Status post coronary artery stent placement  Patient's Home Medications on Admission:  Current Outpatient Medications:    aspirin EC 81 MG tablet, Take 1 tablet (81 mg total) by mouth daily. Swallow whole., Disp: 120 tablet, Rfl: 2   DULoxetine (CYMBALTA) 20 MG capsule, TAKE 2 CAPSULES BY MOUTH DAILY, Disp: 60 capsule, Rfl: 3   DULoxetine (CYMBALTA) 60 MG capsule, Take 1 capsule (60 mg total) by mouth daily., Disp: 90 capsule, Rfl: 3   gabapentin (NEURONTIN) 300 MG capsule, Take 1 capsule (300 mg total) by mouth 2 (two) times daily., Disp: 60 capsule, Rfl: 1   isosorbide mononitrate (IMDUR) 30 MG 24 hr tablet, Take 1 tablet (30 mg total) by mouth daily., Disp: 90 tablet, Rfl: 3   nitroGLYCERIN (NITROSTAT) 0.4 MG SL tablet, Place 1 tablet (0.4 mg total) under the tongue every 5 (five) minutes x 3 doses as needed for chest pain., Disp: 25 tablet, Rfl: 2   prasugrel (EFFIENT) 10 MG TABS tablet, Take 6 tablets by mouth 24 hours after last Brilinta dose. Then 24 hours later take 1 tablet (10mg ) by mouth daily., Disp: 90 tablet, Rfl: 3   rosuvastatin (CRESTOR) 20 MG tablet, Take 1 tablet (20 mg total) by mouth daily., Disp: 90 tablet, Rfl: 3   sildenafil (VIAGRA) 100 MG tablet, Take 100 mg by mouth daily as needed for erectile dysfunction., Disp: , Rfl:    ticagrelor (BRILINTA) 90 MG TABS tablet, Take by mouth 2  (two) times daily., Disp: , Rfl:    Vitamin D, Ergocalciferol, (DRISDOL) 1.25 MG (50000 UNIT) CAPS capsule, TAKE 1 CAPSULE (50,000 UNITS TOTAL) BY MOUTH EVERY 7 (SEVEN) DAYS, Disp: 12 capsule, Rfl: 0  Past Medical History: Past Medical History:  Diagnosis Date   Cataract    Depression    RESOLVED   Hyperlipidemia    Spinal stenosis    WAS HAVING NECK PAIN -PT STATES NERVE ABLATION PROCEDURE 2012 AND PAIN HAS RESOLVED   Spondylitis (HCC)     Tobacco Use: Social History   Tobacco Use  Smoking Status Never  Smokeless Tobacco Never    Labs: Review Flowsheet       Latest Ref Rng & Units 04/18/2018 05/26/2019 05/11/2023 05/26/2023  Labs for ITP Cardiac and Pulmonary Rehab  Cholestrol 100 - 199 mg/dL - 220  - 254   LDL (calc) 0 - 99 mg/dL - 58  - 75   HDL-C >27 mg/dL - 80  - 46   Trlycerides 0 - 149 mg/dL - 96  - 062   Hemoglobin A1c 4.8 - 5.6 % - - 5.1  -  TCO2 22 - 32 mmol/L 29  - - -    Details            Capillary Blood Glucose: No results found for: "GLUCAP"   Exercise Target Goals: Exercise Program Goal: Individual exercise prescription set using results from initial 6 min walk test and THRR  while considering  patient's activity barriers and safety.   Exercise Prescription Goal: Initial exercise prescription builds to 30-45 minutes a day of aerobic activity, 2-3 days per week.  Home exercise guidelines will be given to patient during program as part of exercise prescription that the participant will acknowledge.  Activity Barriers & Risk Stratification:  Activity Barriers & Cardiac Risk Stratification - 06/03/23 1109       Activity Barriers & Cardiac Risk Stratification   Activity Barriers Arthritis;Joint Problems;Neck/Spine Problems;Chest Pain/Angina    Cardiac Risk Stratification Moderate             6 Minute Walk:  6 Minute Walk     Row Name 06/03/23 1108         6 Minute Walk   Phase Initial     Distance 1981 feet     Walk Time 6 minutes      # of Rest Breaks 0     MPH 3.8     METS 4.14     RPE 11     Perceived Dyspnea  0     VO2 Peak 14.5     Symptoms No     Resting HR 76 bpm     Resting BP 114/72     Resting Oxygen Saturation  96 %     Exercise Oxygen Saturation  during 6 min walk 96 %     Max Ex. HR 99 bpm     Max Ex. BP 120/70     2 Minute Post BP 112/68              Oxygen Initial Assessment:   Oxygen Re-Evaluation:   Oxygen Discharge (Final Oxygen Re-Evaluation):   Initial Exercise Prescription:  Initial Exercise Prescription - 06/03/23 1100       Date of Initial Exercise RX and Referring Provider   Date 06/03/23    Referring Provider Donato Schultz, MD    Expected Discharge Date 08/20/23      Treadmill   MPH 3.4    Grade 1    Minutes 15    METs 4.07      Bike   Level 3    Watts 50    Minutes 15    METs 4.1      Prescription Details   Frequency (times per week) 3    Duration Progress to 30 minutes of continuous aerobic without signs/symptoms of physical distress      Intensity   THRR 40-80% of Max Heartrate 59-118    Ratings of Perceived Exertion 11-13    Perceived Dyspnea 0-4      Progression   Progression Continue progressive overload as per policy without signs/symptoms or physical distress.      Resistance Training   Training Prescription Yes    Weight 3lbs    Reps 10-15             Perform Capillary Blood Glucose checks as needed.  Exercise Prescription Changes:   Exercise Prescription Changes     Row Name 06/09/23 1600 06/25/23 1200 07/04/23 1600 07/16/23 1100 08/04/23 1030     Response to Exercise   Blood Pressure (Admit) 112/68 112/66 118/70 110/72 102/54   Blood Pressure (Exercise) 150/80 158/70 142/80 182/82 160/76   Blood Pressure (Exit) 112/70 116/64 110/70 126/70 122/56   Heart Rate (Admit) 68 bpm 81 bpm 68 bpm 69 bpm 63 bpm   Heart Rate (Exercise) 125 bpm 131 bpm 126 bpm 138 bpm 120 bpm   Heart  Rate (Exit) 77 bpm 90 bpm 77 bpm 75 bpm 72 bpm   Rating  of Perceived Exertion (Exercise) 9 11 13 12 10    Symptoms None None None None None   Comments Pt's firs day in the CRP2 porgram Reviewed METs and home exercise Rx Reviewed METs/goals Reviewed METs Reviewed METs and goals   Duration Continue with 30 min of aerobic exercise without signs/symptoms of physical distress. Continue with 30 min of aerobic exercise without signs/symptoms of physical distress. Continue with 30 min of aerobic exercise without signs/symptoms of physical distress. Continue with 30 min of aerobic exercise without signs/symptoms of physical distress. Continue with 30 min of aerobic exercise without signs/symptoms of physical distress.   Intensity THRR unchanged THRR unchanged THRR unchanged THRR New THRR unchanged     Progression   Progression Continue to progress workloads to maintain intensity without signs/symptoms of physical distress. Continue to progress workloads to maintain intensity without signs/symptoms of physical distress. Continue to progress workloads to maintain intensity without signs/symptoms of physical distress. Continue to progress workloads to maintain intensity without signs/symptoms of physical distress. Continue to progress workloads to maintain intensity without signs/symptoms of physical distress.   Average METs 4.45 7.23 7.6 9 7.6     Resistance Training   Training Prescription Yes No Yes No Yes   Weight 3lbs No weights on Wednesdays 6 lbs -- 4 lbs   Reps 10-15 -- 10-15 -- 10-15   Time 10 Minutes -- 10 Minutes -- 10 Minutes     Interval Training   Interval Training No Yes Yes Yes Yes   Equipment -- Treadmill Treadmill Treadmill Treadmill   Comments -- HIIT HIIT HIIT HIIT     Treadmill   MPH 3.4 4.2 4.8 5 4.5   Grade 1 1 1 1 1    Minutes 15 15 15 15 15    METs 4.07 7.72 8.68 9 8.2     Bike   Level 3 4 4 4  4.6   Watts 74 106 103 -- --   Minutes 15 15 15 15 15    METs 4.9 9.9 6.5 -- 10.1  Intervals on the Bike: level 3.6 to 4.6.  Peak METs 10.1      Home Exercise Plan   Plans to continue exercise at -- Home (comment) Home (comment) Home (comment) Home (comment)   Frequency -- Add 2 additional days to program exercise sessions. Add 2 additional days to program exercise sessions. Add 2 additional days to program exercise sessions. Add 2 additional days to program exercise sessions.   Initial Home Exercises Provided -- 06/25/23 06/25/23 06/25/23 06/25/23            Exercise Comments:   Exercise Comments     Row Name 06/09/23 1642 06/25/23 1214 07/04/23 1643 07/16/23 1118 08/04/23 1030   Exercise Comments Pt's first day in the CRP2 program. Pt exercised without comlaints and is off to a good start. Reviewed METs and Home exercise Rx. Pt is making exceelent progress and jogs/walks and uses stationary bike on the weekends. Pt verbalized understanding of the home exericse Rx and was provided a copy. Reviewed METs and goals. Making good progress. Wants to have another increase in THRR. Explained that we increased to 90%. Pt feels like he wants to push harder but is limited by THRR. Reviewed METs. Pt continues to make excellent progress on his MET levels. Pt got increase in his THRR and he is pleased. Reviewed METs and goals. Pt still continues to make excellent progress.  Pt continues to train on his off days for his triathalon.            Exercise Goals and Review:   Exercise Goals     Row Name 06/03/23 1109             Exercise Goals   Increase Physical Activity Yes       Intervention Provide advice, education, support and counseling about physical activity/exercise needs.;Develop an individualized exercise prescription for aerobic and resistive training based on initial evaluation findings, risk stratification, comorbidities and participant's personal goals.       Expected Outcomes Short Term: Attend rehab on a regular basis to increase amount of physical activity.;Long Term: Add in home exercise to make exercise part of  routine and to increase amount of physical activity.;Long Term: Exercising regularly at least 3-5 days a week.       Increase Strength and Stamina Yes       Intervention Provide advice, education, support and counseling about physical activity/exercise needs.;Develop an individualized exercise prescription for aerobic and resistive training based on initial evaluation findings, risk stratification, comorbidities and participant's personal goals.       Expected Outcomes Short Term: Increase workloads from initial exercise prescription for resistance, speed, and METs.;Short Term: Perform resistance training exercises routinely during rehab and add in resistance training at home;Long Term: Improve cardiorespiratory fitness, muscular endurance and strength as measured by increased METs and functional capacity ( )       Able to understand and use rate of perceived exertion (RPE) scale Yes       Intervention Provide education and explanation on how to use RPE scale       Expected Outcomes Short Term: Able to use RPE daily in rehab to express subjective intensity level;Long Term:  Able to use RPE to guide intensity level when exercising independently       Knowledge and understanding of Target Heart Rate Range (THRR) Yes       Intervention Provide education and explanation of THRR including how the numbers were predicted and where they are located for reference       Expected Outcomes Short Term: Able to state/look up THRR;Long Term: Able to use THRR to govern intensity when exercising independently;Short Term: Able to use daily as guideline for intensity in rehab       Understanding of Exercise Prescription Yes       Intervention Provide education, explanation, and written materials on patient's individual exercise prescription       Expected Outcomes Short Term: Able to explain program exercise prescription;Long Term: Able to explain home exercise prescription to exercise independently                 Exercise Goals Re-Evaluation :  Exercise Goals Re-Evaluation     Row Name 06/09/23 1641 07/04/23 1642 08/04/23 1030         Exercise Goal Re-Evaluation   Exercise Goals Review Increase Physical Activity;Able to understand and use Dyspnea scale;Increase Strength and Stamina;Knowledge and understanding of Target Heart Rate Range (THRR);Able to understand and use rate of perceived exertion (RPE) scale Increase Physical Activity;Able to understand and use Dyspnea scale;Increase Strength and Stamina;Knowledge and understanding of Target Heart Rate Range (THRR);Able to understand and use rate of perceived exertion (RPE) scale Increase Physical Activity;Able to understand and use Dyspnea scale;Increase Strength and Stamina;Knowledge and understanding of Target Heart Rate Range (THRR);Able to understand and use rate of perceived exertion (RPE) scale     Comments Pt's  first day in the CRP2 program. Pt understnads the exercise Rx, THRR and RPE scale. Reviewed METs and goals. Pt is making excellent progress with peak METs of 9.9 and average METs of 7.6.  Pt voices progress on his goal of gaining confidence to train. Pt is exercise at home on off days from the CRP2 program. Reviewed METs and goals. Pt  is making good progress. Peak METs 9.9, average is 7.7. Pt is returning to exercise after a bout of COVID. Pt is training for his triathlon and Dr. Anne Fu has raised his THR to 150 to allow his to train more effectively.     Expected Outcomes Will continue to monitor and progress exercise workloads as toelrated. Will continue to monitor and progress exercise workloads as toelrated. Will continue to monitor and progress exercise workloads as toelrated.              Discharge Exercise Prescription (Final Exercise Prescription Changes):  Exercise Prescription Changes - 08/04/23 1030       Response to Exercise   Blood Pressure (Admit) 102/54    Blood Pressure (Exercise) 160/76    Blood Pressure (Exit)  122/56    Heart Rate (Admit) 63 bpm    Heart Rate (Exercise) 120 bpm    Heart Rate (Exit) 72 bpm    Rating of Perceived Exertion (Exercise) 10    Symptoms None    Comments Reviewed METs and goals    Duration Continue with 30 min of aerobic exercise without signs/symptoms of physical distress.    Intensity THRR unchanged      Progression   Progression Continue to progress workloads to maintain intensity without signs/symptoms of physical distress.    Average METs 7.6      Resistance Training   Training Prescription Yes    Weight 4 lbs    Reps 10-15    Time 10 Minutes      Interval Training   Interval Training Yes    Equipment Treadmill    Comments HIIT      Treadmill   MPH 4.5    Grade 1    Minutes 15    METs 8.2      Bike   Level 4.6    Minutes 15    METs 10.1   Intervals on the Bike: level 3.6 to 4.6.  Peak METs 10.1     Home Exercise Plan   Plans to continue exercise at Home (comment)    Frequency Add 2 additional days to program exercise sessions.    Initial Home Exercises Provided 06/25/23             Nutrition:  Target Goals: Understanding of nutrition guidelines, daily intake of sodium 1500mg , cholesterol 200mg , calories 30% from fat and 7% or less from saturated fats, daily to have 5 or more servings of fruits and vegetables.  Biometrics:  Pre Biometrics - 06/03/23 1110       Pre Biometrics   Waist Circumference 37.5 inches    Hip Circumference 37.75 inches    Waist to Hip Ratio 0.99 %    Triceps Skinfold 7 mm    % Body Fat 21.9 %    Grip Strength 36 kg    Flexibility 14.25 in    Single Leg Stand 30 seconds              Nutrition Therapy Plan and Nutrition Goals:  Nutrition Therapy & Goals - 08/11/23 1629       Nutrition Therapy  Diet Heart Healthy Diet    Drug/Food Interactions Statins/Certain Fruits      Personal Nutrition Goals   Nutrition Goal Patient to identify strategies for reducing cardiovascular risk by attending the  [employer] education and nutrition series weekly.   Goal in action.   Personal Goal #2 Patient to improve diet quality by using the plate method as a guide for meal planning to include lean protein/plant protein, fruits, vegetables, whole grains, nonfat dairy as part of a well-balanced diet.   goal in action.   Comments Goals in action Corwin continues to attend the [employer] education and nutrition series regularly. He has good understanding of fiber recommendations, sodium recommendations, and saturated fat intake. Jayvonte is motivated to get back to training for half iron man races. He does report that his race day weight is ~152#. He is tracking his intake with MyFitness Pal to aid with weight loss; answered patient questions regarding macronutrients, calorie needs, etc. He is down 2.9# since starting with our program. Patient will benefit from participation in intensive cardiac rehab for nutrition, exercise, and lifestyle modification.      Intervention Plan   Intervention Prescribe, educate and counsel regarding individualized specific dietary modifications aiming towards targeted core components such as weight, hypertension, lipid management, diabetes, heart failure and other comorbidities.;Nutrition handout(s) given to patient.    Expected Outcomes Short Term Goal: Understand basic principles of dietary content, such as calories, fat, sodium, cholesterol and nutrients.;Long Term Goal: Adherence to prescribed nutrition plan.;Short Term Goal: A plan has been developed with personal nutrition goals set during dietitian appointment.             Nutrition Assessments:  Nutrition Assessments - 06/10/23 0927       Rate Your Plate Scores   Pre Score 63            MEDIFICTS Score Key: >=70 Need to make dietary changes  40-70 Heart Healthy Diet <= 40 Therapeutic Level Cholesterol Diet   Flowsheet Row INTENSIVE CARDIAC REHAB from 06/09/2023 in [facility]  Picture Your Plate Total Score on Admission 63      Picture Your Plate Scores: <40 Unhealthy dietary pattern with much room for improvement. 41-50 Dietary pattern unlikely to meet recommendations for good health and room for improvement. 51-60 More healthful dietary pattern, with some room for improvement.  >60 Healthy dietary pattern, although there may be some specific behaviors that could be improved.    Nutrition Goals Re-Evaluation:  Nutrition Goals Re-Evaluation     Row Name 06/09/23 0929 07/11/23 1003 08/11/23 1629         Goals   Current Weight 170 lb 13.7 oz (77.5 kg) 169 lb 12.1 oz (77 kg) 167 lb 15.9 oz (76.2 kg)     Comment lipoproteinA WNL, LDL 75, A1c WNL no new labs; most recent labs lipoproteinA WNL, LDL 75, A1c WNL no new labs; most recent labs lipoproteinA WNL, LDL 75, A1c WNL     Expected Outcome Grame is motivated to get back to training for half iron man races. He reports that he has previously followed the Pritikin eating plan in the !990s and is motivated to get back to the [employer] eating plan. He does report that his race day weight is ~152#. Patient will benefit from participation in intensive cardiac rehab for nutrition, exercise, and lifestyle modification. Goals in action Baudilio continues to attend the [employer] education and nutrition series regularly. He has good  understanding of fiber recommendations, sodium recommendations, and saturated fat intake. Ako is motivated to get back to training for half iron man races. He does report that his race day weight is ~152#. He is tracking his intake with MyFitness Pal to aid with weight loss; answered patient questions regarding macronutrients, calorie needs, etc. He is down 1.1# since starting with our program. Patient will benefit from participation in intensive cardiac rehab for nutrition, exercise, and lifestyle modification. Goals in action Makade continues to attend the [employer] education  and nutrition series regularly. He has good understanding of fiber recommendations, sodium recommendations, and saturated fat intake. Gilmore is motivated to get back to training for half iron man races. He does report that his race day weight is ~152#. He is tracking his intake with MyFitness Pal to aid with weight loss; answered patient questions regarding macronutrients, calorie needs, etc. He is down 2.9# since starting with our program. Patient will benefit from participation in intensive cardiac rehab for nutrition, exercise, and lifestyle modification.              Nutrition Goals Re-Evaluation:  Nutrition Goals Re-Evaluation     Row Name 06/09/23 0929 07/11/23 1003 08/11/23 1629         Goals   Current Weight 170 lb 13.7 oz (77.5 kg) 169 lb 12.1 oz (77 kg) 167 lb 15.9 oz (76.2 kg)     Comment lipoproteinA WNL, LDL 75, A1c WNL no new labs; most recent labs lipoproteinA WNL, LDL 75, A1c WNL no new labs; most recent labs lipoproteinA WNL, LDL 75, A1c WNL     Expected Outcome Grame is motivated to get back to training for half iron man races. He reports that he has previously followed the Pritikin eating plan in the !990s and is motivated to get back to the Pritikin eating plan. He does report that his race day weight is ~152#. Patient will benefit from participation in intensive cardiac rehab for nutrition, exercise, and lifestyle modification. Goals in action Jeret continues to attend the [employer] education and nutrition series regularly. He has good understanding of fiber recommendations, sodium recommendations, and saturated fat intake. Viviana is motivated to get back to training for half iron man races. He does report that his race day weight is ~152#. He is tracking his intake with MyFitness Pal to aid with weight loss; answered patient questions regarding macronutrients, calorie needs, etc. He is down 1.1# since starting with our program. Patient will benefit from participation in  intensive cardiac rehab for nutrition, exercise, and lifestyle modification. Goals in action Regionald continues to attend the [employer] education and nutrition series regularly. He has good understanding of fiber recommendations, sodium recommendations, and saturated fat intake. Tab is motivated to get back to training for half iron man races. He does report that his race day weight is ~152#. He is tracking his intake with MyFitness Pal to aid with weight loss; answered patient questions regarding macronutrients, calorie needs, etc. He is down 2.9# since starting with our program. Patient will benefit from participation in intensive cardiac rehab for nutrition, exercise, and lifestyle modification.              Nutrition Goals Discharge (Final Nutrition Goals Re-Evaluation):  Nutrition Goals Re-Evaluation - 08/11/23 1629       Goals   Current Weight 167 lb 15.9 oz (76.2 kg)    Comment no new labs; most recent labs lipoproteinA WNL, LDL 75, A1c WNL    Expected Outcome Goals in action  Darko continues to attend the [employer] education and nutrition series regularly. He has good understanding of fiber recommendations, sodium recommendations, and saturated fat intake. Jashua is motivated to get back to training for half iron man races. He does report that his race day weight is ~152#. He is tracking his intake with MyFitness Pal to aid with weight loss; answered patient questions regarding macronutrients, calorie needs, etc. He is down 2.9# since starting with our program. Patient will benefit from participation in intensive cardiac rehab for nutrition, exercise, and lifestyle modification.             Psychosocial: Target Goals: Acknowledge presence or absence of significant depression and/or stress, maximize coping skills, provide positive support system. Participant is able to verbalize types and ability to use techniques and skills needed for reducing stress and depression.  Initial Review &  Psychosocial Screening:  Initial Psych Review & Screening - 06/03/23 1040       Initial Review   Current issues with Current Stress Concerns    Comments Pt has just recently moved into a new home      Family Dynamics   Good Support System? Yes   Has spouse, daughter and dogs     Barriers   Psychosocial barriers to participate in program The patient should benefit from training in stress management and relaxation.      Screening Interventions   Interventions Encouraged to exercise             Quality of Life Scores:  Quality of Life - 06/03/23 1105       Quality of Life   Select Quality of Life      Quality of Life Scores   Health/Function Pre 24.83 %    Socioeconomic Pre 25.6 %    Psych/Spiritual Pre 25.75 %    Family Pre 26.4 %    GLOBAL Pre 25.39 %            Scores of 19 and below usually indicate a poorer quality of life in these areas.  A difference of  2-3 points is a clinically meaningful difference.  A difference of 2-3 points in the total score of the Quality of Life Index has been associated with significant improvement in overall quality of life, self-image, physical symptoms, and general health in studies assessing change in quality of life.  PHQ-9: Review Flowsheet  More data may exist      06/03/2023 10/16/2015 09/12/2015 07/18/2015 07/07/2015  Depression screen PHQ 2/9  Decreased Interest 1 0 0 0 0 0  Down, Depressed, Hopeless 1 0 0 0 0 0  PHQ - 2 Score 2 0 0 0 0 0  Altered sleeping 0 - - - -  Tired, decreased energy 1 - - - -  Change in appetite 1 - - - -  Feeling bad or failure about yourself  0 - - - -  Trouble concentrating 0 - - - -  Moving slowly or fidgety/restless 0 - - - -  Suicidal thoughts 0 - - - -  PHQ-9 Score 4 - - - -  Difficult doing work/chores Not difficult at all - - - -    Details       Multiple values from one day are sorted in reverse-chronological order        Interpretation of Total Score  Total Score  Depression Severity:  1-4 = Minimal depression, 5-9 = Mild depression, 10-14 = Moderate depression, 15-19 = Moderately severe depression, 20-27 =  Severe depression   Psychosocial Evaluation and Intervention:   Psychosocial Re-Evaluation:  Psychosocial Re-Evaluation     Row Name 06/09/23 [phone] 06/18/23 0831 07/15/23 0921 08/12/23 1116       Psychosocial Re-Evaluation   Current issues with None Identified None Identified None Identified None Identified;History of Depression    Comments Taahir denies stress or concerns on his first day of exercise. -- -- No concerns or stressors have been voiced during exercise at cardiac rehab    Expected Outcomes Dinari will have decreased concerns or stressors on his first day of exercise -- -- --    Interventions Encouraged to attend Cardiac Rehabilitation for the exercise;Stress management education Encouraged to attend Cardiac Rehabilitation for the exercise Encouraged to attend Cardiac Rehabilitation for the exercise Encouraged to attend Cardiac Rehabilitation for the exercise    Continue Psychosocial Services  No Follow up required No Follow up required No Follow up required No Follow up required             Psychosocial Discharge (Final Psychosocial Re-Evaluation):  Psychosocial Re-Evaluation - 08/12/23 1116       Psychosocial Re-Evaluation   Current issues with None Identified;History of Depression    Comments No concerns or stressors have been voiced during exercise at cardiac rehab    Interventions Encouraged to attend Cardiac Rehabilitation for the exercise    Continue Psychosocial Services  No Follow up required             Vocational Rehabilitation: Provide vocational rehab assistance to qualifying candidates.   Vocational Rehab Evaluation & Intervention:  Vocational Rehab - 06/03/23 1041       Initial Vocational Rehab Evaluation & Intervention   Assessment shows need for Vocational Rehabilitation No   Pt is retitred, no  needs            Education: Education Goals: Education classes will be provided on a weekly basis, covering required topics. Participant will state understanding/return demonstration of topics presented.    Education     Row Name 06/09/23 0900     Education   Cardiac Education Topics Pritikin   Select Core Videos     Core Videos   Educator Exercise Physiologist   Select Nutrition   Nutrition Facts on Fat   Instruction Review Code 1- Verbalizes Understanding   Class Start Time [phone]   Class Stop Time 0848   Class Time Calculation (min) 32 min    Row Name 06/11/23 1000     Education   Cardiac Education Topics Pritikin   Secondary school teacher School   Educator Dietitian   Weekly Topic Fast Evening Meals   Instruction Review Code 1- Verbalizes Understanding   Class Start Time [phone]   Class Stop Time 0845   Class Time Calculation (min) 33 min    Row Name 06/13/23 0900     Education   Cardiac Education Topics Pritikin   Select Core Videos     Core Videos   Educator Dietitian   Select Nutrition   Nutrition Vitamins and Minerals   Instruction Review Code 1- Verbalizes Understanding   Class Start Time 0818   Class Stop Time 0857   Class Time Calculation (min) 39 min    Row Name 06/16/23 1100     Education   Cardiac Education Topics Pritikin   Geographical information systems officer Psychosocial   Psychosocial Workshop Healthy Sleep for a Healthy  Heart   Instruction Review Code 1- Verbalizes Understanding   Class Start Time [phone]   Class Stop Time 0900   Class Time Calculation (min) 50 min    Row Name 06/18/23 1000     Education   Cardiac Education Topics Pritikin   Customer service manager   Weekly Topic International Cuisine- Spotlight on the [facility] Zones   Instruction Review Code 1- Verbalizes Understanding   Class Start Time 0815   Class Stop Time 0850   Class  Time Calculation (min) 35 min    Row Name 06/20/23 0900     Education   Cardiac Education Topics Pritikin   Select Core Videos     Core Videos   Educator Exercise Physiologist   Select Exercise Education   Exercise Education Improving Performance   Instruction Review Code 1- Verbalizes Understanding   Class Start Time 0810   Class Stop Time 0846   Class Time Calculation (min) 36 min    Row Name 06/23/23 0900     Education   Cardiac Education Topics Pritikin   Select Workshops     Workshops   Educator Dietitian   Select Nutrition   Nutrition Workshop Fueling a Forensic psychologist   Instruction Review Code 1- Verbalizes Understanding   Class Start Time 0815   Class Stop Time 0900   Class Time Calculation (min) 45 min    Row Name 06/25/23 1000     Education   Cardiac Education Topics Pritikin   Secondary school teacher School   Educator Dietitian   Weekly Topic Simple Sides and Sauces   Instruction Review Code 1- Verbalizes Understanding   Class Start Time 0815   Class Stop Time 0846   Class Time Calculation (min) 31 min    Row Name 06/27/23 0900     Education   Cardiac Education Topics Pritikin   Select Core Videos     Core Videos   Educator Exercise Physiologist   Select Psychosocial   Psychosocial How Our Thoughts Can Heal Our Hearts   Instruction Review Code 1- Verbalizes Understanding   Class Start Time 0807   Class Stop Time 0842   Class Time Calculation (min) 35 min    Row Name 06/30/23 0900     Education   Cardiac Education Topics Pritikin   Select Workshops     Workshops   Educator Exercise Physiologist   Select Exercise   Exercise Workshop Managing Heart Disease: Your Path to a Healthier Heart   Instruction Review Code 1- Verbalizes Understanding   Class Start Time [phone]   Class Stop Time 0908   Class Time Calculation (min) 60 min    Row Name 07/02/23 1000     Education   Cardiac Education Topics Pritikin   Librarian, academic School   Educator Dietitian   Weekly Topic Powerhouse Plant-Based Proteins   Instruction Review Code 1- Verbalizes Understanding   Class Start Time 0815   Class Stop Time 0856   Class Time Calculation (min) 41 min    Row Name 07/04/23 0900     Education   Cardiac Education Topics Pritikin   Psychologist, forensic General Education   General Education Hypertension and Heart Disease   Instruction Review Code 1- Verbalizes Understanding   Class Start Time [phone]  Class Stop Time 0850   Class Time Calculation (min) 40 min    Row Name 07/07/23 0900     Education   Cardiac Education Topics Pritikin   Geographical information systems officer Psychosocial   Psychosocial Workshop From Head to Heart: The Power of a Healthy Outlook   Instruction Review Code 1- Verbalizes Understanding   Class Start Time 0815   Class Stop Time 0904   Class Time Calculation (min) 49 min    Row Name 07/09/23 1000     Education   Cardiac Education Topics Pritikin   Secondary school teacher School   Educator Dietitian   Weekly Topic Adding Flavor - Sodium-Free   Instruction Review Code 1- Verbalizes Understanding   Class Start Time [phone]   Class Stop Time 0845   Class Time Calculation (min) 31 min    Row Name 07/14/23 1100     Education   Cardiac Education Topics Pritikin   Select Core Videos     Core Videos   Educator Dietitian   Select Nutrition   Nutrition Overview of the Pritikin Eating Plan   Instruction Review Code 1- Verbalizes Understanding   Class Start Time 0815   Class Stop Time 0850   Class Time Calculation (min) 35 min    Row Name 07/16/23 1100     Education   Cardiac Education Topics Pritikin   Designer, multimedia   Weekly Topic Fast and Healthy Breakfasts   Instruction Review Code 1- Verbalizes Understanding    Class Start Time 0815   Class Stop Time 0847   Class Time Calculation (min) 32 min    Row Name 07/18/23 0800     Education   Cardiac Education Topics Pritikin   Select Core Videos     Core Videos   Educator Exercise Physiologist   Select Psychosocial   Psychosocial Healthy Minds, Bodies, Hearts   Instruction Review Code 1- Verbalizes Understanding   Class Start Time 0813   Class Stop Time 0845   Class Time Calculation (min) 32 min    Row Name 08/01/23 1000     Education   Cardiac Education Topics Pritikin   Select Core Videos     Core Videos   Educator Dietitian   Select Nutrition   Nutrition Other  Label Reading   Instruction Review Code 1- Verbalizes Understanding   Class Start Time 0815   Class Stop Time 0900   Class Time Calculation (min) 45 min    Row Name 08/04/23 1100     Education   Cardiac Education Topics Pritikin   Tax adviser Exercise Physiologist   Select Psychosocial   Psychosocial Workshop Recognizing and Reducing Stress   Instruction Review Code 1- Verbalizes Understanding   Class Start Time 0815   Class Stop Time 0900   Class Time Calculation (min) 45 min    Row Name 08/06/23 1000     Education   Cardiac Education Topics Pritikin   Orthoptist   Educator Dietitian   Weekly Topic Tasty Appetizers and Snacks   Instruction Review Code 1- Verbalizes Understanding   Class Start Time 0815   Class Stop Time 0900   Class Time Calculation (min) 45 min    Row Name 08/08/23 1300  Education   Cardiac Education Topics Pritikin   Nurse, children's Exercise Physiologist   Select Nutrition   Nutrition Calorie Density   Instruction Review Code 1- Verbalizes Understanding   Class Start Time [phone]   Class Stop Time 0900   Class Time Calculation (min) 44 min    Row Name 08/11/23 0900     Education   Cardiac Education Topics Pritikin   Select  Workshops     Workshops   Educator Exercise Physiologist   Select Exercise   Exercise Workshop Exercise Basics: Building Your Action Plan   Instruction Review Code 1- Verbalizes Understanding   Class Start Time 0813   Class Stop Time 0900   Class Time Calculation (min) 47 min            Core Videos: Exercise    Move It!  Clinical staff conducted group or individual video education with verbal and written material and guidebook.  Patient learns the recommended Pritikin exercise program. Exercise with the goal of living a long, healthy life. Some of the health benefits of exercise include controlled diabetes, healthier blood pressure levels, improved cholesterol levels, improved heart and lung capacity, improved sleep, and better body composition. Everyone should speak with their doctor before starting or changing an exercise routine.  Biomechanical Limitations Clinical staff conducted group or individual video education with verbal and written material and guidebook.  Patient learns how biomechanical limitations can impact exercise and how we can mitigate and possibly overcome limitations to have an impactful and balanced exercise routine.  Body Composition Clinical staff conducted group or individual video education with verbal and written material and guidebook.  Patient learns that body composition (ratio of muscle mass to fat mass) is a key component to assessing overall fitness, rather than body weight alone. Increased fat mass, especially visceral belly fat, can put [place] at increased risk for metabolic syndrome, type 2 diabetes, heart disease, and even death. It is recommended to combine diet and exercise (cardiovascular and resistance training) to improve your body composition. Seek guidance from your physician and exercise physiologist before implementing an exercise routine.  Exercise Action Plan Clinical staff conducted group or individual video education with verbal and  written material and guidebook.  Patient learns the recommended strategies to achieve and enjoy long-term exercise adherence, including variety, self-motivation, self-efficacy, and positive decision making. Benefits of exercise include fitness, good health, weight management, more energy, better sleep, less stress, and overall well-being.  Medical   Heart Disease Risk Reduction Clinical staff conducted group or individual video education with verbal and written material and guidebook.  Patient learns our heart is our most vital organ as it circulates oxygen, nutrients, white blood cells, and hormones throughout the entire body, and carries waste away. Data supports a plant-based eating plan like the Pritikin Program for its effectiveness in slowing progression of and reversing heart disease. The video provides a number of recommendations to address heart disease.   Metabolic Syndrome and Belly Fat  Clinical staff conducted group or individual video education with verbal and written material and guidebook.  Patient learns what metabolic syndrome is, how it leads to heart disease, and how one can reverse it and keep it from coming back. You have metabolic syndrome if you have 3 of the following 5 criteria: abdominal obesity, high blood pressure, high triglycerides, low HDL cholesterol, and high blood sugar.  Hypertension and Heart Disease Clinical staff conducted group or individual video  education with verbal and written material and guidebook.  Patient learns that high blood pressure, or hypertension, is very common in the [place]. Hypertension is largely due to excessive salt intake, but other important risk factors include being overweight, physical inactivity, drinking too much alcohol, smoking, and not eating enough potassium from fruits and vegetables. High blood pressure is a leading risk factor for heart attack, stroke, congestive heart failure, dementia, kidney failure, and premature  death. Long-term effects of excessive salt intake include stiffening of the arteries and thickening of heart muscle and organ damage. Recommendations include ways to reduce hypertension and the risk of heart disease.  Diseases of Our Time - Focusing on Diabetes Clinical staff conducted group or individual video education with verbal and written material and guidebook.  Patient learns why the best way to stop diseases of our time is prevention, through food and other lifestyle changes. Medicine (such as prescription pills and surgeries) is often only a Band-Aid on the problem, not a long-term solution. Most common diseases of our time include obesity, type 2 diabetes, hypertension, heart disease, and cancer. The Pritikin Program is recommended and has been proven to help reduce, reverse, and/or prevent the damaging effects of metabolic syndrome.  Nutrition   Overview of the Pritikin Eating Plan  Clinical staff conducted group or individual video education with verbal and written material and guidebook.  Patient learns about the Pritikin Eating Plan for disease risk reduction. The Pritikin Eating Plan emphasizes a wide variety of unrefined, minimally-processed carbohydrates, like fruits, vegetables, whole grains, and legumes. Go, Caution, and Stop food choices are explained. Plant-based and lean animal proteins are emphasized. Rationale provided for low sodium intake for blood pressure control, low added sugars for blood sugar stabilization, and low added fats and oils for coronary artery disease risk reduction and weight management.  Calorie Density  Clinical staff conducted group or individual video education with verbal and written material and guidebook.  Patient learns about calorie density and how it impacts the Pritikin Eating Plan. Knowing the characteristics of the food you choose will help you decide whether those foods will lead to weight gain or weight loss, and whether you want to consume  more or less of them. Weight loss is usually a side effect of the Pritikin Eating Plan because of its focus on low calorie-dense foods.  Label Reading  Clinical staff conducted group or individual video education with verbal and written material and guidebook.  Patient learns about the Pritikin recommended label reading guidelines and corresponding recommendations regarding calorie density, added sugars, sodium content, and whole grains.  Dining Out - Part 1  Clinical staff conducted group or individual video education with verbal and written material and guidebook.  Patient learns that restaurant meals can be sabotaging because they can be so high in calories, fat, sodium, and/or sugar. Patient learns recommended strategies on how to positively address this and avoid unhealthy pitfalls.  Facts on Fats  Clinical staff conducted group or individual video education with verbal and written material and guidebook.  Patient learns that lifestyle modifications can be just as effective, if not more so, as many medications for lowering your risk of heart disease. A Pritikin lifestyle can help to reduce your risk of inflammation and atherosclerosis (cholesterol build-up, or plaque, in the artery walls). Lifestyle interventions such as dietary choices and physical activity address the cause of atherosclerosis. A review of the types of fats and their impact on blood cholesterol levels, along with dietary recommendations to  reduce fat intake is also included.  Nutrition Action Plan  Clinical staff conducted group or individual video education with verbal and written material and guidebook.  Patient learns how to incorporate Pritikin recommendations into their lifestyle. Recommendations include planning and keeping personal health goals in mind as an important part of their success.  Healthy Mind-Set    Healthy Minds, Bodies, Hearts  Clinical staff conducted group or individual video education with verbal and  written material and guidebook.  Patient learns how to identify when they are stressed. Video will discuss the impact of that stress, as well as the many benefits of stress management. Patient will also be introduced to stress management techniques. The way we think, act, and feel has an impact on our hearts.  How Our Thoughts Can Heal Our Hearts  Clinical staff conducted group or individual video education with verbal and written material and guidebook.  Patient learns that negative thoughts can cause depression and anxiety. This can result in negative lifestyle behavior and serious health problems. Cognitive behavioral therapy is an effective method to help control our thoughts in order to change and improve our emotional outlook.  Additional Videos:  Exercise    Improving Performance  Clinical staff conducted group or individual video education with verbal and written material and guidebook.  Patient learns to use a non-linear approach by alternating intensity levels and lengths of time spent exercising to help burn more calories and lose more body fat. Cardiovascular exercise helps improve heart health, metabolism, hormonal balance, blood sugar control, and recovery from fatigue. Resistance training improves strength, endurance, balance, coordination, reaction time, metabolism, and muscle mass. Flexibility exercise improves circulation, posture, and balance. Seek guidance from your physician and exercise physiologist before implementing an exercise routine and learn your capabilities and proper form for all exercise.  Introduction to Yoga  Clinical staff conducted group or individual video education with verbal and written material and guidebook.  Patient learns about yoga, a discipline of the coming together of mind, breath, and body. The benefits of yoga include improved flexibility, improved range of motion, better posture and core strength, increased lung function, weight loss, and positive  self-image. Yoga's heart health benefits include lowered blood pressure, healthier heart rate, decreased cholesterol and triglyceride levels, improved immune function, and reduced stress. Seek guidance from your physician and exercise physiologist before implementing an exercise routine and learn your capabilities and proper form for all exercise.  Medical   Aging: Enhancing Your Quality of Life  Clinical staff conducted group or individual video education with verbal and written material and guidebook.  Patient learns key strategies and recommendations to stay in good physical health and enhance quality of life, such as prevention strategies, having an advocate, securing a Health Care Proxy and Power of Attorney, and keeping a list of medications and system for tracking them. It also discusses how to avoid risk for bone loss.  Biology of Weight Control  Clinical staff conducted group or individual video education with verbal and written material and guidebook.  Patient learns that weight gain occurs because we consume more calories than we burn (eating more, moving less). Even if your body weight is normal, you may have higher ratios of fat compared to muscle mass. Too much body fat puts you at increased risk for cardiovascular disease, heart attack, stroke, type 2 diabetes, and obesity-related cancers. In addition to exercise, following the Pritikin Eating Plan can help reduce your risk.  Decoding Lab Results  Clinical staff conducted group or  individual video education with verbal and written material and guidebook.  Patient learns that lab test reflects one measurement whose values change over time and are influenced by many factors, including medication, stress, sleep, exercise, food, hydration, pre-existing medical conditions, and more. It is recommended to use the knowledge from this video to become more involved with your lab results and evaluate your numbers to speak with your  doctor.   Diseases of Our Time - Overview  Clinical staff conducted group or individual video education with verbal and written material and guidebook.  Patient learns that according to the CDC, 50% to 70% of chronic diseases (such as obesity, type 2 diabetes, elevated lipids, hypertension, and heart disease) are avoidable through lifestyle improvements including healthier food choices, listening to satiety cues, and increased physical activity.  Sleep Disorders Clinical staff conducted group or individual video education with verbal and written material and guidebook.  Patient learns how good quality and duration of sleep are important to overall health and well-being. Patient also learns about sleep disorders and how they impact health along with recommendations to address them, including discussing with a physician.  Nutrition  Dining Out - Part 2 Clinical staff conducted group or individual video education with verbal and written material and guidebook.  Patient learns how to plan ahead and communicate in order to maximize their dining experience in a healthy and nutritious manner. Included are recommended food choices based on the type of restaurant the patient is visiting.   Fueling a Banker conducted group or individual video education with verbal and written material and guidebook.  There is a strong connection between our food choices and our health. Diseases like obesity and type 2 diabetes are very prevalent and are in large-part due to lifestyle choices. The Pritikin Eating Plan provides plenty of food and hunger-curbing satisfaction. It is easy to follow, affordable, and helps reduce health risks.  Menu Workshop  Clinical staff conducted group or individual video education with verbal and written material and guidebook.  Patient learns that restaurant meals can sabotage health goals because they are often packed with calories, fat, sodium, and sugar.  Recommendations include strategies to plan ahead and to communicate with the manager, chef, or server to help order a healthier meal.  Planning Your Eating Strategy  Clinical staff conducted group or individual video education with verbal and written material and guidebook.  Patient learns about the Pritikin Eating Plan and its benefit of reducing the risk of disease. The Pritikin Eating Plan does not focus on calories. Instead, it emphasizes high-quality, nutrient-rich foods. By knowing the characteristics of the foods, we choose, we can determine their calorie density and make informed decisions.  Targeting Your Nutrition Priorities  Clinical staff conducted group or individual video education with verbal and written material and guidebook.  Patient learns that lifestyle habits have a tremendous impact on disease risk and progression. This video provides eating and physical activity recommendations based on your personal health goals, such as reducing LDL cholesterol, losing weight, preventing or controlling type 2 diabetes, and reducing high blood pressure.  Vitamins and Minerals  Clinical staff conducted group or individual video education with verbal and written material and guidebook.  Patient learns different ways to obtain key vitamins and minerals, including through a recommended healthy diet. It is important to discuss all supplements you take with your doctor.   Healthy Mind-Set    Smoking Cessation  Clinical staff conducted group or individual video education with verbal  and written material and guidebook.  Patient learns that cigarette smoking and tobacco addiction pose a serious health risk which affects millions of people. Stopping smoking will significantly reduce the risk of heart disease, lung disease, and many forms of cancer. Recommended strategies for quitting are covered, including working with your doctor to develop a successful plan.  Culinary   Becoming a Corporate investment banker conducted group or individual video education with verbal and written material and guidebook.  Patient learns that cooking at home can be healthy, cost-effective, quick, and puts them in control. Keys to cooking healthy recipes will include looking at your recipe, assessing your equipment needs, planning ahead, making it simple, choosing cost-effective seasonal ingredients, and limiting the use of added fats, salts, and sugars.  Cooking - Breakfast and Snacks  Clinical staff conducted group or individual video education with verbal and written material and guidebook.  Patient learns how important breakfast is to satiety and nutrition through the entire day. Recommendations include key foods to eat during breakfast to help stabilize blood sugar levels and to prevent overeating at meals later in the day. Planning ahead is also a key component.  Cooking - Educational psychologist conducted group or individual video education with verbal and written material and guidebook.  Patient learns eating strategies to improve overall health, including an approach to cook more at home. Recommendations include thinking of animal protein as a side on your plate rather than center stage and focusing instead on lower calorie dense options like vegetables, fruits, whole grains, and plant-based proteins, such as beans. Making sauces in large quantities to freeze for later and leaving the skin on your vegetables are also recommended to maximize your experience.  Cooking - Healthy Salads and Dressing Clinical staff conducted group or individual video education with verbal and written material and guidebook.  Patient learns that vegetables, fruits, whole grains, and legumes are the foundations of the Pritikin Eating Plan. Recommendations include how to incorporate each of these in flavorful and healthy salads, and how to create homemade salad dressings. Proper handling of ingredients is also covered.  Cooking - Soups and [employer] - Soups and Desserts Clinical staff conducted group or individual video education with verbal and written material and guidebook.  Patient learns that Pritikin soups and desserts make for easy, nutritious, and delicious snacks and meal components that are low in sodium, fat, sugar, and calorie density, while high in vitamins, minerals, and filling fiber. Recommendations include simple and healthy ideas for soups and desserts.   Overview     The Pritikin Solution Program Overview Clinical staff conducted group or individual video education with verbal and written material and guidebook.  Patient learns that the results of the Pritikin Program have been documented in more than 100 articles published in peer-reviewed journals, and the benefits include reducing risk factors for (and, in some cases, even reversing) high cholesterol, high blood pressure, type 2 diabetes, obesity, and more! An overview of the three key pillars of the Pritikin Program will be covered: eating well, doing regular exercise, and having a healthy mind-set.  WORKSHOPS  Exercise: Exercise Basics: Building Your Action Plan Clinical staff led group instruction and group discussion with PowerPoint presentation and patient guidebook. To enhance the learning environment the use of posters, models and videos may be added. At the conclusion of this workshop, patients will comprehend the difference between physical activity and exercise, as well as the benefits of incorporating both, into  their routine. Patients will understand the FITT (Frequency, Intensity, Time, and Type) principle and how to use it to build an exercise action plan. In addition, safety concerns and other considerations for exercise and cardiac rehab will be addressed by the presenter. The purpose of this lesson is to promote a comprehensive and effective weekly exercise routine in order to improve patients' overall level of  fitness.   Managing Heart Disease: Your Path to a Healthier Heart Clinical staff led group instruction and group discussion with PowerPoint presentation and patient guidebook. To enhance the learning environment the use of posters, models and videos may be added.At the conclusion of this workshop, patients will understand the anatomy and physiology of the heart. Additionally, they will understand how Pritikin's three pillars impact the risk factors, the progression, and the management of heart disease.  The purpose of this lesson is to provide a high-level overview of the heart, heart disease, and how the Pritikin lifestyle positively impacts risk factors.  Exercise Biomechanics Clinical staff led group instruction and group discussion with PowerPoint presentation and patient guidebook. To enhance the learning environment the use of posters, models and videos may be added. Patients will learn how the structural parts of their bodies function and how these functions impact their daily activities, movement, and exercise. Patients will learn how to promote a neutral spine, learn how to manage pain, and identify ways to improve their physical movement in order to promote healthy living. The purpose of this lesson is to expose patients to common physical limitations that impact physical activity. Participants will learn practical ways to adapt and manage aches and pains, and to minimize their effect on regular exercise. Patients will learn how to maintain good posture while sitting, walking, and lifting.  Balance Training and Fall Prevention  Clinical staff led group instruction and group discussion with PowerPoint presentation and patient guidebook. To enhance the learning environment the use of posters, models and videos may be added. At the conclusion of this workshop, patients will understand the importance of their sensorimotor skills (vision, proprioception, and the vestibular system)  in maintaining their ability to balance as they age. Patients will apply a variety of balancing exercises that are appropriate for their current level of function. Patients will understand the common causes for poor balance, possible solutions to these problems, and ways to modify their physical environment in order to minimize their fall risk. The purpose of this lesson is to teach patients about the importance of maintaining balance as they age and ways to minimize their risk of falling.  WORKSHOPS   Nutrition:  Fueling a Ship broker led group instruction and group discussion with PowerPoint presentation and patient guidebook. To enhance the learning environment the use of posters, models and videos may be added. Patients will review the foundational principles of the Pritikin Eating Plan and understand what constitutes a serving size in each of the food groups. Patients will also learn Pritikin-friendly foods that are better choices when away from home and review make-ahead meal and snack options. Calorie density will be reviewed and applied to three nutrition priorities: weight maintenance, weight loss, and weight gain. The purpose of this lesson is to reinforce (in a group setting) the key concepts around what patients are recommended to eat and how to apply these guidelines when away from home by planning and selecting Pritikin-friendly options. Patients will understand how calorie density may be adjusted for different weight management goals.  Mindful Eating  Clinical staff led group  instruction and group discussion with PowerPoint presentation and patient guidebook. To enhance the learning environment the use of posters, models and videos may be added. Patients will briefly review the concepts of the Pritikin Eating Plan and the importance of low-calorie dense foods. The concept of mindful eating will be introduced as well as the importance of paying attention to internal hunger  signals. Triggers for non-hunger eating and techniques for dealing with triggers will be explored. The purpose of this lesson is to provide patients with the opportunity to review the basic principles of the Pritikin Eating Plan, discuss the value of eating mindfully and how to measure internal cues of hunger and fullness using the Hunger Scale. Patients will also discuss reasons for non-hunger eating and learn strategies to use for controlling emotional eating.  Targeting Your Nutrition Priorities Clinical staff led group instruction and group discussion with PowerPoint presentation and patient guidebook. To enhance the learning environment the use of posters, models and videos may be added. Patients will learn how to determine their genetic susceptibility to disease by reviewing their family history. Patients will gain insight into the importance of diet as part of an overall healthy lifestyle in mitigating the impact of genetics and other environmental insults. The purpose of this lesson is to provide patients with the opportunity to assess their personal nutrition priorities by looking at their family history, their own health history and current risk factors. Patients will also be able to discuss ways of prioritizing and modifying the Pritikin Eating Plan for their highest risk areas  Menu  Clinical staff led group instruction and group discussion with PowerPoint presentation and patient guidebook. To enhance the learning environment the use of posters, models and videos may be added. Using menus brought in from [employer], or printed from [employer], patients will apply the Pritikin dining out guidelines that were presented in the [employer] video. Patients will also be able to practice these guidelines in a variety of provided scenarios. The purpose of this lesson is to provide patients with the opportunity to practice hands-on learning of the Pritikin Dining Out guidelines  with actual menus and practice scenarios.  Label Reading Clinical staff led group instruction and group discussion with PowerPoint presentation and patient guidebook. To enhance the learning environment the use of posters, models and videos may be added. Patients will review and discuss the Pritikin label reading guidelines presented in Pritikin's Label Reading Educational series video. Using fool labels brought in from local grocery stores and markets, patients will apply the label reading guidelines and determine if the packaged food meet the Pritikin guidelines. The purpose of this lesson is to provide patients with the opportunity to review, discuss, and practice hands-on learning of the Pritikin Label Reading guidelines with actual packaged food labels. Cooking School  Pritikin's [employer] are designed to teach patients ways to prepare quick, simple, and affordable recipes at home. The importance of nutrition's role in chronic disease risk reduction is reflected in its emphasis in the overall Pritikin program. By learning how to prepare essential core Pritikin Eating Plan recipes, patients will increase control over what they eat; be able to customize the flavor of foods without the use of added salt, sugar, or fat; and improve the quality of the food they consume. By learning a set of core recipes which are easily assembled, quickly prepared, and affordable, patients are more likely to prepare more healthy foods at home. These workshops focus on convenient breakfasts, simple entres,  side dishes, and desserts which can be prepared with minimal effort and are consistent with nutrition recommendations for cardiovascular risk reduction. Cooking [employer] are taught by a Armed forces logistics/support/administrative officer (RD) who has been trained by the [employer]. The chef or RD has a clear understanding of the importance of minimizing - if not completely eliminating - added fat, sugar, and  sodium in recipes. Throughout the series of Cooking School Workshop sessions, patients will learn about healthy ingredients and efficient methods of cooking to build confidence in their capability to prepare    Cooking School weekly topics:  Adding Flavor- Sodium-Free  Fast and Healthy Breakfasts  Powerhouse Plant-Based Proteins  Satisfying Salads and Dressings  Simple Sides and Sauces  International Cuisine-Spotlight on the [employer] Zones  Delicious Desserts  Savory Soups  [employer] - Meals in a Astronomer Appetizers and Snacks  Comforting Weekend Breakfasts  One-Pot Wonders   Fast Evening Meals  Landscape architect Your Pritikin Plate  WORKSHOPS   Healthy Mindset (Psychosocial):  Focused Goals, Sustainable Changes Clinical staff led group instruction and group discussion with PowerPoint presentation and patient guidebook. To enhance the learning environment the use of posters, models and videos may be added. Patients will be able to apply effective goal setting strategies to establish at least one personal goal, and then take consistent, meaningful action toward that goal. They will learn to identify common barriers to achieving personal goals and develop strategies to overcome them. Patients will also gain an understanding of how our mind-set can impact our ability to achieve goals and the importance of cultivating a positive and growth-oriented mind-set. The purpose of this lesson is to provide patients with a deeper understanding of how to set and achieve personal goals, as well as the tools and strategies needed to overcome common obstacles which may arise along the way.  From Head to Heart: The Power of a Healthy Outlook  Clinical staff led group instruction and group discussion with PowerPoint presentation and patient guidebook. To enhance the learning environment the use of posters, models and videos may be added. Patients will be able to recognize and  describe the impact of emotions and mood on physical health. They will discover the importance of self-care and explore self-care practices which may work for them. Patients will also learn how to utilize the 4 C's to cultivate a healthier outlook and better manage stress and challenges. The purpose of this lesson is to demonstrate to patients how a healthy outlook is an essential part of maintaining good health, especially as they continue their cardiac rehab journey.  Healthy Sleep for a Healthy Heart Clinical staff led group instruction and group discussion with PowerPoint presentation and patient guidebook. To enhance the learning environment the use of posters, models and videos may be added. At the conclusion of this workshop, patients will be able to demonstrate knowledge of the importance of sleep to overall health, well-being, and quality of life. They will understand the symptoms of, and treatments for, common sleep disorders. Patients will also be able to identify daytime and nighttime behaviors which impact sleep, and they will be able to apply these tools to help manage sleep-related challenges. The purpose of this lesson is to provide patients with a general overview of sleep and outline the importance of quality sleep. Patients will learn about a few of the most common sleep disorders. Patients will also be introduced to the concept of "sleep hygiene," and discover ways to  self-manage certain sleeping problems through simple daily behavior changes. Finally, the workshop will motivate patients by clarifying the links between quality sleep and their goals of heart-healthy living.   Recognizing and Reducing Stress Clinical staff led group instruction and group discussion with PowerPoint presentation and patient guidebook. To enhance the learning environment the use of posters, models and videos may be added. At the conclusion of this workshop, patients will be able to understand the types of stress  reactions, differentiate between acute and chronic stress, and recognize the impact that chronic stress has on their health. They will also be able to apply different coping mechanisms, such as reframing negative self-talk. Patients will have the opportunity to practice a variety of stress management techniques, such as deep abdominal breathing, progressive muscle relaxation, and/or guided imagery.  The purpose of this lesson is to educate patients on the role of stress in their lives and to provide healthy techniques for coping with it.  Learning Barriers/Preferences:  Learning Barriers/Preferences - 06/03/23 1106       Learning Barriers/Preferences   Learning Barriers Sight;Hearing    Learning Preferences Computer/Internet;Group Instruction;Individual Instruction;Pictoral;Skilled Demonstration;Video             Education Topics:  Knowledge Questionnaire Score:  Knowledge Questionnaire Score - 06/03/23 1107       Knowledge Questionnaire Score   Pre Score 19/24             Core Components/Risk Factors/Patient Goals at Admission:  Personal Goals and Risk Factors at Admission - 06/03/23 1041       Core Components/Risk Factors/Patient Goals on Admission   Lipids Yes    Intervention Provide education and support for participant on nutrition & aerobic/resistive exercise along with prescribed medications to achieve LDL 70mg , HDL >40mg .    Expected Outcomes Short Term: Participant states understanding of desired cholesterol values and is compliant with medications prescribed. Participant is following exercise prescription and nutrition guidelines.;Long Term: Cholesterol controlled with medications as prescribed, with individualized exercise RX and with personalized nutrition plan. Value goals: LDL < 70mg , HDL > 40 mg.    Stress Yes    Intervention Offer individual and/or small group education and counseling on adjustment to heart disease, stress management and health-related  lifestyle change. Teach and support self-help strategies.;Refer participants experiencing significant psychosocial distress to appropriate mental health specialists for further evaluation and treatment. When possible, include family members and significant others in education/counseling sessions.    Expected Outcomes Long Term: Emotional wellbeing is indicated by absence of clinically significant psychosocial distress or social isolation.;Short Term: Participant demonstrates changes in health-related behavior, relaxation and other stress management skills, ability to obtain effective social support, and compliance with psychotropic medications if prescribed.             Core Components/Risk Factors/Patient Goals Review:   Goals and Risk Factor Review     Row Name 06/09/23 0919 06/18/23 5621 07/15/23 0925 08/12/23 1118       Core Components/Risk Factors/Patient Goals Review   Personal Goals Review Lipids;Stress Lipids;Stress Lipids;Stress Lipids;Stress    Review Raylyn started cardiac rehab on 06/09/23 and did well with exercise. Vital signs were stable Yordin started cardiac rehab on 06/09/23 and is off to a good start to exercise.  Vital signs have been  stable Jaymere is doing very well with exercise.  Vital signs have been  stable. Pierino is now jogging and ask asked for target hear rate increases as Eshawn wants to participate in triathalons again. Grame has had  no reports of angina with exercise. Trevelle is doing very well with exercise.  Vital signs have been. Grame has had no reports of angina with exercise. Pheng was absent for 10 days due to a recent COVID 19 infection. Estevan has lost 1.3 kg since starting cardiac rehab. Cyris continius to run and exercise at a high met level.    Expected Outcomes Karmel will continue to participate in intensive cardiac rehab for exercise, nutrition and lifestyle modifications Ruan will continue to participate in intensive cardiac rehab for exercise,  nutrition and lifestyle modifications Bowdrie will continue to participate in intensive cardiac rehab for exercise, nutrition and lifestyle modifications Srinath will continue to participate in intensive cardiac rehab for exercise, nutrition and lifestyle modifications             Core Components/Risk Factors/Patient Goals at Discharge (Final Review):   Goals and Risk Factor Review - 08/12/23 1118       Core Components/Risk Factors/Patient Goals Review   Personal Goals Review Lipids;Stress    Review Havyn is doing very well with exercise.  Vital signs have been. Grame has had no reports of angina with exercise. Dominick was absent for 10 days due to a recent COVID 19 infection. Johnryan has lost 1.3 kg since starting cardiac rehab. Hallie continius to run and exercise at a high met level.    Expected Outcomes Kay will continue to participate in intensive cardiac rehab for exercise, nutrition and lifestyle modifications             ITP Comments:  ITP Comments     Row Name 06/03/23 0926 06/09/23 0916 06/18/23 0831 07/15/23 0920 08/12/23 1115   ITP Comments Armanda Magic, MD: Medical Director.  Introduction to the Pritikin Education Program/Intensive Cardiac Rehab.  Initial orientation packet reviewed with the patient. 30 day ITP Review. Dora started intensive cardiac rehab on 06/09/23 and did well with exercise. 30 day ITP Review. Ammiel started intensive cardiac rehab on 06/09/23 and is off to a good start to exercise 30 day ITP Review. Onni has good attendance and participation  in  cardiac rehab 30 day ITP Review. Aum continues to have  good attendance and participation in cardiac rehab.            Comments: See ITP comments.Thayer Headings RN BSN

## 2023-08-13 ENCOUNTER — Encounter (HOSPITAL_COMMUNITY)
Admission: RE | Admit: 2023-08-13 | Discharge: 2023-08-13 | Disposition: A | Discharge status: Home / Self Care | Payer: Medicare Other | Source: Ambulatory Visit | Attending: Cardiology

## 2023-08-13 DIAGNOSIS — Z955 Presence of coronary angioplasty implant and graft: Secondary | ICD-10-CM | POA: Diagnosis not present

## 2023-08-13 DIAGNOSIS — I214 Non-ST elevation (NSTEMI) myocardial infarction: Secondary | ICD-10-CM | POA: Diagnosis not present

## 2023-08-14 DIAGNOSIS — M25611 Stiffness of right shoulder, not elsewhere classified: Secondary | ICD-10-CM | POA: Diagnosis not present

## 2023-08-14 DIAGNOSIS — M7541 Impingement syndrome of right shoulder: Secondary | ICD-10-CM | POA: Diagnosis not present

## 2023-08-14 DIAGNOSIS — R293 Abnormal posture: Secondary | ICD-10-CM | POA: Diagnosis not present

## 2023-08-14 DIAGNOSIS — M5412 Radiculopathy, cervical region: Secondary | ICD-10-CM | POA: Diagnosis not present

## 2023-08-14 DIAGNOSIS — M25511 Pain in right shoulder: Secondary | ICD-10-CM | POA: Diagnosis not present

## 2023-08-15 ENCOUNTER — Encounter (HOSPITAL_COMMUNITY)
Admission: RE | Admit: 2023-08-15 | Discharge: 2023-08-15 | Disposition: A | Discharge status: Home / Self Care | Payer: Medicare Other | Source: Ambulatory Visit | Attending: Cardiology | Admitting: Cardiology

## 2023-08-15 DIAGNOSIS — I214 Non-ST elevation (NSTEMI) myocardial infarction: Secondary | ICD-10-CM

## 2023-08-15 DIAGNOSIS — Z955 Presence of coronary angioplasty implant and graft: Secondary | ICD-10-CM | POA: Diagnosis not present

## 2023-08-17 ENCOUNTER — Other Ambulatory Visit: Payer: Self-pay | Admitting: Family Medicine

## 2023-08-18 ENCOUNTER — Other Ambulatory Visit (HOSPITAL_BASED_OUTPATIENT_CLINIC_OR_DEPARTMENT_OTHER): Payer: Self-pay

## 2023-08-18 ENCOUNTER — Encounter (HOSPITAL_COMMUNITY)
Admission: RE | Admit: 2023-08-18 | Discharge: 2023-08-18 | Disposition: A | Discharge status: Home / Self Care | Payer: Medicare Other | Source: Ambulatory Visit | Attending: Cardiology | Admitting: Cardiology

## 2023-08-18 DIAGNOSIS — I214 Non-ST elevation (NSTEMI) myocardial infarction: Secondary | ICD-10-CM

## 2023-08-18 DIAGNOSIS — Z955 Presence of coronary angioplasty implant and graft: Secondary | ICD-10-CM

## 2023-08-19 DIAGNOSIS — M7541 Impingement syndrome of right shoulder: Secondary | ICD-10-CM | POA: Diagnosis not present

## 2023-08-19 DIAGNOSIS — R293 Abnormal posture: Secondary | ICD-10-CM | POA: Diagnosis not present

## 2023-08-19 DIAGNOSIS — M5412 Radiculopathy, cervical region: Secondary | ICD-10-CM | POA: Diagnosis not present

## 2023-08-19 DIAGNOSIS — M25511 Pain in right shoulder: Secondary | ICD-10-CM | POA: Diagnosis not present

## 2023-08-19 DIAGNOSIS — M25611 Stiffness of right shoulder, not elsewhere classified: Secondary | ICD-10-CM | POA: Diagnosis not present

## 2023-08-20 ENCOUNTER — Encounter (HOSPITAL_COMMUNITY)
Admission: RE | Admit: 2023-08-20 | Discharge: 2023-08-20 | Disposition: A | Discharge status: Home / Self Care | Payer: Medicare Other | Source: Ambulatory Visit | Attending: Cardiology | Admitting: Cardiology

## 2023-08-20 DIAGNOSIS — Z8249 Family history of ischemic heart disease and other diseases of the circulatory system: Secondary | ICD-10-CM | POA: Insufficient documentation

## 2023-08-20 DIAGNOSIS — I214 Non-ST elevation (NSTEMI) myocardial infarction: Secondary | ICD-10-CM | POA: Insufficient documentation

## 2023-08-20 DIAGNOSIS — I2510 Atherosclerotic heart disease of native coronary artery without angina pectoris: Secondary | ICD-10-CM | POA: Insufficient documentation

## 2023-08-20 DIAGNOSIS — Z955 Presence of coronary angioplasty implant and graft: Secondary | ICD-10-CM | POA: Diagnosis not present

## 2023-08-20 DIAGNOSIS — R030 Elevated blood-pressure reading, without diagnosis of hypertension: Secondary | ICD-10-CM | POA: Diagnosis not present

## 2023-08-20 DIAGNOSIS — E785 Hyperlipidemia, unspecified: Secondary | ICD-10-CM | POA: Diagnosis not present

## 2023-08-21 DIAGNOSIS — M7541 Impingement syndrome of right shoulder: Secondary | ICD-10-CM | POA: Diagnosis not present

## 2023-08-21 DIAGNOSIS — M25611 Stiffness of right shoulder, not elsewhere classified: Secondary | ICD-10-CM | POA: Diagnosis not present

## 2023-08-21 DIAGNOSIS — R293 Abnormal posture: Secondary | ICD-10-CM | POA: Diagnosis not present

## 2023-08-21 DIAGNOSIS — M25511 Pain in right shoulder: Secondary | ICD-10-CM | POA: Diagnosis not present

## 2023-08-21 DIAGNOSIS — M5412 Radiculopathy, cervical region: Secondary | ICD-10-CM | POA: Diagnosis not present

## 2023-08-22 ENCOUNTER — Encounter (HOSPITAL_COMMUNITY)
Admission: RE | Admit: 2023-08-22 | Discharge: 2023-08-22 | Disposition: A | Discharge status: Home / Self Care | Payer: Medicare Other | Source: Ambulatory Visit | Attending: Cardiology

## 2023-08-22 VITALS — Ht 71.0 in | Wt 168.9 lb

## 2023-08-22 DIAGNOSIS — I214 Non-ST elevation (NSTEMI) myocardial infarction: Secondary | ICD-10-CM | POA: Diagnosis not present

## 2023-08-22 DIAGNOSIS — I2510 Atherosclerotic heart disease of native coronary artery without angina pectoris: Secondary | ICD-10-CM | POA: Diagnosis not present

## 2023-08-22 DIAGNOSIS — Z955 Presence of coronary angioplasty implant and graft: Secondary | ICD-10-CM

## 2023-08-22 DIAGNOSIS — E785 Hyperlipidemia, unspecified: Secondary | ICD-10-CM | POA: Diagnosis not present

## 2023-08-22 DIAGNOSIS — Z8249 Family history of ischemic heart disease and other diseases of the circulatory system: Secondary | ICD-10-CM | POA: Diagnosis not present

## 2023-08-22 DIAGNOSIS — R030 Elevated blood-pressure reading, without diagnosis of hypertension: Secondary | ICD-10-CM | POA: Diagnosis not present

## 2023-08-24 NOTE — Progress Notes (Unsigned)
Cardiology Office Note:  .   Date:  08/25/2023  ID:  Paul [name], DOB 1951-10-07, MRN [id number] PCP: Paul Retort, MD  [facility] Providers Cardiologist:  Donato Schultz, MD {  History of Present Illness: Paul [name] Kitchen   Paul [name] is a 71 y.o. male with a past medical history of HLD and family history of CAD (father died with MI at age 17) who is being seen for follow-up appointment.  Patient is a triathlon athlete who was previously seen by Dr. Anne Fu in 2020 due to symptomatic LAD and LCx calcification noted on CT of the chest.  Underwent a nuclear stress test afterward for which she was noted to be negative for ischemia or infarction.  Had been doing well since then.  Very active bikes greater than 100 miles per week.  Days of admission while he was biking he started noticing sudden onset of pain across his chest, pressure-like sensation associated with nausea which prompted him to stop.  Had never happened before.  The pain lasted approximately 5 minutes and went away.  Started biking again and noticed sudden onset of symptoms after 3 miles.  Decided to stop and call his wife.  Pain resolved at 5 minutes again and since he was so close to him he tried to keep going but only minimal exertion he developed symptoms again and decided to come into the ER.  On his way to the ER had significant shortness of breath which again worsened than usual.  On arrival, resting heart rate between 50 and 60 bpm, BP 150/70, RR 15.  Labs relevant for glucose 187, creatinine 1.1, hemoglobin 15, troponin 13>> 53.  CXR with enlarged cardiomediastinal silhouette.  Given 324 of ASA.  Admitted for plans for cardiac cath.  Found to have NSTEMI.  Cardiac cath noted two-vessel CAD LAD and RCA.  Proximal RCA CTO with left to right collaterals blood flow, proximal to mid LAD treated with FFR guided PCI/DES x 2.  Echo showed LVEF 50 to 55%, moderate LVH, grade 1 DD, mildly reduced RV function, trivial MR.   Recommendations for DAPT with aspirin/Brilinta for at least 1 year.  Seen by cardiac rehab.  Recommended to continue aspirin, Brilinta, and Crestor daily.  He admitted to not being compliant with his statin prior to admission.  He was seen by me 05/23/2023, he tells me taht when he is walking in his heart gets to 88 he feels some chest tightness as well as pressure in his neck.  When his heart rate gets to 90 to 92 bpm his pain gets a little heavy.  [place], [place] so he ends up going uphill while being.  We reviewed his most recent labs and his LDL at 211.  Unfortunately, back in the fall he stopped his Crestor because he was tired of taking medication.  We will recheck his lipid panel and LFTs to get more accurate numbers and then titrate his medications as needed.  We discussed starting on Imdur daily for an antianginal medication.  Also, he is anxious to start cardiac rehab ASAP.  He tells me he needs an epidural placed in C7 and T1 however, would require stopping anticoagulation for 48 hours.  He will need to wait 6 months before doing so.  Encouraged to try more conservative measures for pain control in the time being.  He does occasionally take tramadol when the pain gets severe.  Today, he tells me that things have  been good.  He is getting back some speed and some strength.  Training for a triathlon at the moment and recently biked 16 miles, swim a mile, and ran 7 miles without any angina.  Max heart rate on his run was 158 bpm.  Max heart rate on his bike was 145 bpm.  He wants to keep his medications the same for his upcoming race but I told him after his race if he wants to half his Imdur and see how he tolerates that that would be fine.  Blood pressure has been normal low recently.  Reports no shortness of breath nor dyspnea on exertion. Reports no chest pain, pressure, or tightness. No edema, orthopnea, PND. Reports no palpitations.    ROS: Pertinent ROS in  HPI  Studies Reviewed: .        Echo: 05/25/23   IMPRESSIONS     1. Left ventricular ejection fraction, by estimation, is 50 to 55%. Left  ventricular ejection fraction by 2D MOD biplane is 53.4 %. The left  ventricle has low normal function. The left ventricle has no regional wall  motion abnormalities. There is  moderate left ventricular hypertrophy. Left ventricular diastolic  parameters are consistent with Grade I diastolic dysfunction (impaired  relaxation).   2. Right ventricular systolic function is mildly reduced. The right  ventricular size is normal. Tricuspid regurgitation signal is inadequate  for assessing PA pressure.   3. Right atrial size was mildly dilated.   4. The mitral valve is abnormal. Trivial mitral valve regurgitation.   5. The aortic valve is tricuspid. Aortic valve regurgitation is trivial.  Aortic valve sclerosis/calcification is present, without any evidence of  aortic stenosis. Aortic valve mean gradient measures 8.0 mmHg.   6. Aortic dilatation noted. There is borderline dilatation of the aortic  root, measuring 39 mm.   7. The inferior vena cava is normal in size with greater than 50%  respiratory variability, suggesting right atrial pressure of 3 mmHg.   Comparison(s): No prior Echocardiogram.   FINDINGS   Left Ventricle: Left ventricular ejection fraction, by estimation, is 50  to 55%. Left ventricular ejection fraction by 2D MOD biplane is 53.4 %.  The left ventricle has low normal function. The left ventricle has no  regional wall motion abnormalities.  The left ventricular internal cavity size was normal in size. There is  moderate left ventricular hypertrophy. Left ventricular diastolic  parameters are consistent with Grade I diastolic dysfunction (impaired  relaxation). Normal left ventricular filling  pressure.   Right Ventricle: The right ventricular size is normal. No increase in  right ventricular wall thickness. Right  ventricular systolic function is  mildly reduced. Tricuspid regurgitation signal is inadequate for assessing  PA pressure.   Left Atrium: Left atrial size was normal in size.   Right Atrium: Right atrial size was mildly dilated. Prominent Eustachian  valve.   Pericardium: There is no evidence of pericardial effusion.   Mitral Valve: The mitral valve is abnormal. There is mild calcification of  the anterior mitral valve leaflet(s). Trivial mitral valve regurgitation.   Tricuspid Valve: The tricuspid valve is grossly normal. Tricuspid valve  regurgitation is trivial.   Aortic Valve: The aortic valve is tricuspid. Aortic valve regurgitation is  trivial. Aortic valve sclerosis/calcification is present, without any  evidence of aortic stenosis. Aortic valve mean gradient measures 8.0 mmHg.  Aortic valve peak gradient  measures 14.9 mmHg. Aortic valve area, by VTI measures 1.92 cm.   Pulmonic Valve: The  pulmonic valve was grossly normal. Pulmonic valve  regurgitation is trivial.   Aorta: Aortic dilatation noted. There is borderline dilatation of the  aortic root, measuring 39 mm.   Venous: The inferior vena cava is normal in size with greater than 50%  respiratory variability, suggesting right atrial pressure of 3 mmHg.   IAS/Shunts: The interatrial septum is aneurysmal. No atrial level shunt  detected by color flow Doppler.    Cath: 05/12/2023     Lesion #1 prox LAD lesion is 70% stenosed with 60% stenosed side branch in 1st Diag. 1st Sept lesion is 60% stenosed.   A drug-eluting stent was successfully placed using a SYNERGY XD 3.0X16 -postdilated to 3.6-3.3 mm.  Post intervention, there is a 0% residual stenosis.  TIMI-3 flow pre and post; post sidebranches were stable at 60% stenosis.   Lesion #2 Dist LAD lesion is 80% stenosed.   A drug-eluting stent was successfully placed using a SYNERGY XD 2.50X16 deployed to 2.7 mm.. Post intervention, there is a 0% residual stenosis.   TIMI-3 flow pre and post   -------------------------   Ramus lesion is 60% stenosed.   Mid Cx lesion is 40% stenosed with 50% stenosed side branch in 1st Mrg.   Prox RCA to Dist RCA lesion is 100% stenosed. -RPDA fills via left to right collaterals   -------------------------   The left ventricular systolic function is normal by Echo.  LV end diastolic pressure is normal.   There is no aortic valve stenosis.   -------------------------   Stable low risk ACS patient.  In the absence of any other complications or medical issues, we expect the patient to be ready for discharge from an interventional cardiology perspective on 05/12/2023.   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 TIMI 3 flow pre & post difficult distal LAD DES PCI reducing 80% to 0% (Synergy XD 2.5 x 16 - deployed to 2.7 mm). TIMI 3 flow pre & post with brisk L-RPDA collaterals (difficult due to distal lesion in tortous vesel - required buddy wire & multiple balloon inflations.  Normal LVEDP & normal EF by [facility]    RECOMMENDATIONS Aggressive titrate medical therapy for existing CAD including the RCA CTO DAPT for at least 1 year Chest pain stable for same-day discharge today    Bryan Lemma, MD   Diagnostic Dominance: Co-dominant  Intervention    _____________      Physical Exam:   VS:  BP 108/70   Pulse 75   Ht 5\' 11"  (1.803 m)   Wt 165 lb (74.8 kg)   SpO2 95%   BMI 23.01 kg/m    Wt Readings from Last 3 Encounters:  08/25/23 165 lb (74.8 kg)  08/22/23 168 lb 14 oz (76.6 kg)  07/29/23 172 lb (78 kg)    GEN: Well nourished, well developed in no acute distress NECK: No  JVD; No carotid bruits CARDIAC: RRR, no murmurs, rubs, gallops RESPIRATORY:  Clear to auscultation without rales, wheezing or rhonchi  ABDOMEN: Soft, non-tender, non-distended EXTREMITIES:  No edema; No deformity   ASSESSMENT AND PLAN: .  1.  NSTEMI/PCI to LAD x 2 -Continue current medications which include aspirin 81 mg daily, Brilinta 90 mg twice a day, Crestor 20 mg daily, nitro as needed,  Imdur 30 mg daily with plans to reduce to 15mg  after his race -Continue heart healthy, low-sodium diet -He has done really well at cardiac rehab and plans to do a triathlon later this month  2.  HLD -Recent LDL to goal (off statin -Will get updated lipid panel and LFTs and plan to titrate Crestor plus possibly adding Zetia to his regimen for LDL goal less than 55 -Continue lipid-lowering diet  3.  Elevated blood pressure reading -normal today 108/70 -Would continue current medication regimen       Dispo: He can follow-up in 1 year with Dr. Anne Fu or APP  Signed, Sharlene Dory, PA-C

## 2023-08-25 ENCOUNTER — Encounter (HOSPITAL_COMMUNITY)
Admission: RE | Admit: 2023-08-25 | Discharge: 2023-08-25 | Disposition: A | Discharge status: Home / Self Care | Payer: Medicare Other | Source: Ambulatory Visit | Attending: Cardiology | Admitting: Cardiology

## 2023-08-25 ENCOUNTER — Ambulatory Visit (INDEPENDENT_AMBULATORY_CARE_PROVIDER_SITE_OTHER): Payer: Medicare Other | Admitting: Physician Assistant

## 2023-08-25 ENCOUNTER — Encounter: Payer: Self-pay | Admitting: Physician Assistant

## 2023-08-25 ENCOUNTER — Other Ambulatory Visit (HOSPITAL_BASED_OUTPATIENT_CLINIC_OR_DEPARTMENT_OTHER): Payer: Self-pay

## 2023-08-25 VITALS — BP 108/70 | HR 75 | Ht 71.0 in | Wt 165.0 lb

## 2023-08-25 DIAGNOSIS — Z8249 Family history of ischemic heart disease and other diseases of the circulatory system: Secondary | ICD-10-CM | POA: Diagnosis not present

## 2023-08-25 DIAGNOSIS — E785 Hyperlipidemia, unspecified: Secondary | ICD-10-CM | POA: Insufficient documentation

## 2023-08-25 DIAGNOSIS — I214 Non-ST elevation (NSTEMI) myocardial infarction: Secondary | ICD-10-CM | POA: Diagnosis not present

## 2023-08-25 DIAGNOSIS — Z955 Presence of coronary angioplasty implant and graft: Secondary | ICD-10-CM

## 2023-08-25 DIAGNOSIS — I2510 Atherosclerotic heart disease of native coronary artery without angina pectoris: Secondary | ICD-10-CM | POA: Diagnosis not present

## 2023-08-25 DIAGNOSIS — R030 Elevated blood-pressure reading, without diagnosis of hypertension: Secondary | ICD-10-CM | POA: Insufficient documentation

## 2023-08-25 DIAGNOSIS — Z23 Encounter for immunization: Secondary | ICD-10-CM | POA: Diagnosis not present

## 2023-08-25 MED ORDER — INFLUENZA VAC A&B SURF ANT ADJ 0.5 ML IM SUSY
0.5000 mL | PREFILLED_SYRINGE | Freq: Once | INTRAMUSCULAR | 0 refills | Status: AC
  Filled 2023-08-25: qty 0.5, 1d supply, fill #0

## 2023-08-25 NOTE — Patient Instructions (Signed)
Medication Instructions:  Your physician recommends that you continue on your current medications as directed. Please refer to the Current Medication list given to you today.  *If you need a refill on your cardiac medications before your next appointment, please call your pharmacy*  Lab Work: Fasting lipids in 05/2024 If you have labs (blood work) drawn today and your tests are completely normal, you will receive your results only by: MyChart Message (if you have MyChart) OR A paper copy in the mail If you have any lab test that is abnormal or we need to change your treatment, we will call you to review the results.  Follow-Up: At [facility], you and your health needs are our priority.  As part of our continuing mission to provide you with exceptional heart care, we have created designated Provider Care Teams.  These Care Teams include your primary Cardiologist (physician) and Advanced Practice Providers (APPs -  Physician Assistants and Nurse Practitioners) who all work together to provide you with the care you need, when you need it.  Your next appointment:   1 year(s)  Provider:   Donato Schultz, MD  or Jari Favre, PA-C    Heart-Healthy Eating Plan Many factors influence your heart health, including eating and exercise habits. Heart health is also called coronary health. Coronary risk increases with abnormal blood fat (lipid) levels. A heart-healthy eating plan includes limiting unhealthy fats, increasing healthy fats, limiting salt (sodium) intake, and making other diet and lifestyle changes. What is my plan? Your health care provider may recommend that: You limit your fat intake to _________% or less of your total calories each day. You limit your saturated fat intake to _________% or less of your total calories each day. You limit the amount of cholesterol in your diet to less than _________ mg per day. You limit the amount of sodium in your diet to less than _________ mg per  day. What are tips for following this plan? Cooking Cook foods using methods other than frying. Baking, boiling, grilling, and broiling are all good options. Other ways to reduce fat include: Removing the skin from poultry. Removing all visible fats from meats. Steaming vegetables in water or broth. Meal planning  At meals, imagine dividing your plate into fourths: Fill one-half of your plate with vegetables and green salads. Fill one-fourth of your plate with whole grains. Fill one-fourth of your plate with lean protein foods. Eat 2-4 cups of vegetables per day. One cup of vegetables equals 1 cup (91 g) broccoli or cauliflower florets, 2 medium carrots, 1 large bell pepper, 1 large sweet potato, 1 large tomato, 1 medium white potato, 2 cups (150 g) raw leafy greens. Eat 1-2 cups of fruit per day. One cup of fruit equals 1 small apple, 1 large banana, 1 cup (237 g) mixed fruit, 1 large orange,  cup (82 g) dried fruit, 1 cup (240 mL) 100% fruit juice. Eat more foods that contain soluble fiber. Examples include apples, broccoli, carrots, beans, peas, and barley. Aim to get 25-30 g of fiber per day. Increase your consumption of legumes, nuts, and seeds to 4-5 servings per week. One serving of dried beans or legumes equals  cup (90 g) cooked, 1 serving of nuts is  oz (12 almonds, 24 pistachios, or 7 walnut halves), and 1 serving of seeds equals  oz (8 g). Fats Choose healthy fats more often. Choose monounsaturated and polyunsaturated fats, such as olive and canola oils, avocado oil, flaxseeds, walnuts, almonds, and  seeds. Eat more omega-3 fats. Choose salmon, mackerel, sardines, tuna, flaxseed oil, and ground flaxseeds. Aim to eat fish at least 2 times each week. Check food labels carefully to identify foods with trans fats or high amounts of saturated fat. Limit saturated fats. These are found in animal products, such as meats, butter, and cream. Plant sources of saturated fats include palm  oil, palm kernel oil, and coconut oil. Avoid foods with partially hydrogenated oils in them. These contain trans fats. Examples are stick margarine, some tub margarines, cookies, crackers, and other baked goods. Avoid fried foods. General information Eat more home-cooked food and less restaurant, buffet, and fast food. Limit or avoid alcohol. Limit foods that are high in added sugar and simple starches such as foods made using white refined flour (white breads, pastries, sweets). Lose weight if you are overweight. Losing just 5-10% of your body weight can help your overall health and prevent diseases such as diabetes and heart disease. Monitor your sodium intake, especially if you have high blood pressure. Talk with your health care provider about your sodium intake. Try to incorporate more vegetarian meals weekly. What foods should I eat? Fruits All fresh, canned (in natural juice), or frozen fruits. Vegetables Fresh or frozen vegetables (raw, steamed, roasted, or grilled). Green salads. Grains Most grains. Choose whole wheat and whole grains most of the time. Rice and pasta, including brown rice and pastas made with whole wheat. Meats and other proteins Lean, well-trimmed beef, veal, pork, and lamb. Chicken and [place] without skin. All fish and shellfish. Wild duck, rabbit, pheasant, and venison. Egg whites or low-cholesterol egg substitutes. Dried beans, peas, lentils, and tofu. Seeds and most nuts. Dairy Low-fat or nonfat cheeses, including ricotta and mozzarella. Skim or 1% milk (liquid, powdered, or evaporated). Buttermilk made with low-fat milk. Nonfat or low-fat yogurt. Fats and oils Non-hydrogenated (trans-free) margarines. Vegetable oils, including soybean, sesame, sunflower, olive, avocado, peanut, safflower, corn, canola, and cottonseed. Salad dressings or mayonnaise made with a vegetable oil. Beverages Water (mineral or sparkling). Coffee and tea. Unsweetened ice tea. Diet  beverages. Sweets and desserts Sherbet, gelatin, and fruit ice. Small amounts of dark chocolate. Limit all sweets and desserts. Seasonings and condiments All seasonings and condiments. The items listed above may not be a complete list of foods and beverages you can eat. Contact a dietitian for more options. What foods should I avoid? Fruits Canned fruit in heavy syrup. Fruit in cream or butter sauce. Fried fruit. Limit coconut. Vegetables Vegetables cooked in cheese, cream, or butter sauce. Fried vegetables. Grains Breads made with saturated or trans fats, oils, or whole milk. Croissants. Sweet rolls. Donuts. High-fat crackers, such as cheese crackers and chips. Meats and other proteins Fatty meats, such as hot dogs, ribs, sausage, bacon, rib-eye roast or steak. High-fat deli meats, such as salami and bologna. Caviar. Domestic duck and goose. Organ meats, such as liver. Dairy Cream, sour cream, cream cheese, and creamed cottage cheese. Whole-milk cheeses. Whole or 2% milk (liquid, evaporated, or condensed). Whole buttermilk. Cream sauce or high-fat cheese sauce. Whole-milk yogurt. Fats and oils Meat fat, or shortening. Cocoa butter, hydrogenated oils, palm oil, coconut oil, palm kernel oil. Solid fats and shortenings, including bacon fat, salt pork, lard, and butter. Nondairy cream substitutes. Salad dressings with cheese or sour cream. Beverages Regular sodas and any drinks with added sugar. Sweets and desserts Frosting. Pudding. Cookies. Cakes. Pies. Milk chocolate or white chocolate. Buttered syrups. Full-fat ice cream or ice cream drinks. The items listed  above may not be a complete list of foods and beverages to avoid. Contact a dietitian for more information. Summary Heart-healthy meal planning includes limiting unhealthy fats, increasing healthy fats, limiting salt (sodium) intake and making other diet and lifestyle changes. Lose weight if you are overweight. Losing just 5-10% of  your body weight can help your overall health and prevent diseases such as diabetes and heart disease. Focus on eating a balance of foods, including fruits and vegetables, low-fat or nonfat dairy, lean protein, nuts and legumes, whole grains, and heart-healthy oils and fats. This information is not intended to replace advice given to you by your health care provider. Make sure you discuss any questions you have with your health care provider. Document Revised: 12/10/2021 Document Reviewed: 12/10/2021 Elsevier Patient Education  2024 [employer] Low-Sodium Eating Plan Salt (sodium) helps you keep a healthy balance of fluids in your body. Too much sodium can raise your blood pressure. It can also cause fluid and waste to be held in your body. Your health care provider or dietitian may recommend a low-sodium eating plan if you have high blood pressure (hypertension), kidney disease, liver disease, or heart failure. Eating less sodium can help lower your blood pressure and reduce swelling. It can also protect your heart, liver, and kidneys. What are tips for following this plan? Reading food labels  Check food labels for the amount of sodium per serving. If you eat more than one serving, you must multiply the listed amount by the number of servings. Choose foods with less than 140 milligrams (mg) of sodium per serving. Avoid foods with 300 mg of sodium or more per serving. Always check how much sodium is in a product, even if the label says "unsalted" or "no salt added." Shopping  Buy products labeled as "low-sodium" or "no salt added." Buy fresh foods. Avoid canned foods and pre-made or frozen meals. Avoid canned, cured, or processed meats. Buy breads that have less than 80 mg of sodium per slice. Cooking  Eat more home-cooked food. Try to eat less restaurant, buffet, and fast food. Try not to add salt when you cook. Use salt-free seasonings or herbs instead of table salt or sea salt. Check  with your provider or pharmacist before using salt substitutes. Cook with plant-based oils, such as canola, sunflower, or olive oil. Meal planning When eating at a restaurant, ask if your food can be made with less salt or no salt. Avoid dishes labeled as brined, pickled, cured, or smoked. Avoid dishes made with soy sauce, miso, or teriyaki sauce. Avoid foods that have monosodium glutamate (MSG) in them. MSG may be added to some restaurant food, sauces, soups, bouillon, and canned foods. Make meals that can be grilled, baked, poached, roasted, or steamed. These are often made with less sodium. General information Try to limit your sodium intake to 1,500-2,300 mg each day, or the amount told by your provider. What foods should I eat? Fruits Fresh, frozen, or canned fruit. Fruit juice. Vegetables Fresh or frozen vegetables. "No salt added" canned vegetables. "No salt added" tomato sauce and paste. Low-sodium or reduced-sodium tomato and vegetable juice. Grains Low-sodium cereals, such as oats, puffed wheat and rice, and shredded wheat. Low-sodium crackers. Unsalted rice. Unsalted pasta. Low-sodium bread. Whole grain breads and whole grain pasta. Meats and other proteins Fresh or frozen meat, poultry, seafood, and fish. These should have no added salt. Low-sodium canned tuna and salmon. Unsalted nuts. Dried peas, beans, and lentils without added salt. Unsalted canned  beans. Eggs. Unsalted nut butters. Dairy Milk. Soy milk. Cheese that is naturally low in sodium, such as ricotta cheese, fresh mozzarella, or Swiss cheese. Low-sodium or reduced-sodium cheese. Cream cheese. Yogurt. Seasonings and condiments Fresh and dried herbs and spices. Salt-free seasonings. Low-sodium mustard and ketchup. Sodium-free salad dressing. Sodium-free light mayonnaise. Fresh or refrigerated horseradish. Lemon juice. Vinegar. Other foods Homemade, reduced-sodium, or low-sodium soups. Unsalted popcorn and pretzels.  Low-salt or salt-free chips. The items listed above may not be all the foods and drinks you can have. Talk to a dietitian to learn more. What foods should I avoid? Vegetables Sauerkraut, pickled vegetables, and relishes. Olives. [place] fries. Onion rings. Regular canned vegetables, except low-sodium or reduced-sodium items. Regular canned tomato sauce and paste. Regular tomato and vegetable juice. Frozen vegetables in sauces. Grains Instant hot cereals. Bread stuffing, pancake, and biscuit mixes. Croutons. Seasoned rice or pasta mixes. Noodle soup cups. Boxed or frozen macaroni and cheese. Regular salted crackers. Self-rising flour. Meats and other proteins Meat or fish that is salted, canned, smoked, spiced, or pickled. Precooked or cured meat, such as sausages or meat loaves. Tomasa Blase. Ham. Pepperoni. Hot dogs. Corned beef. Chipped beef. Salt pork. Jerky. Pickled herring, anchovies, and sardines. Regular canned tuna. Salted nuts. Dairy Processed cheese and cheese spreads. Hard cheeses. Cheese curds. Blue cheese. Feta cheese. String cheese. Regular cottage cheese. Buttermilk. Canned milk. Fats and oils Salted butter. Regular margarine. Ghee. Bacon fat. Seasonings and condiments Onion salt, garlic salt, seasoned salt, table salt, and sea salt. Canned and packaged gravies. Worcestershire sauce. Tartar sauce. Barbecue sauce. Teriyaki sauce. Soy sauce, including reduced-sodium soy sauce. Steak sauce. Fish sauce. Oyster sauce. Cocktail sauce. Horseradish that you find on the shelf. Regular ketchup and mustard. Meat flavorings and tenderizers. Bouillon cubes. Hot sauce. Pre-made or packaged marinades. Pre-made or packaged taco seasonings. Relishes. Regular salad dressings. Salsa. Other foods Salted popcorn and pretzels. Corn chips and puffs. Potato and tortilla chips. Canned or dried soups. Pizza. Frozen entrees and pot pies. The items listed above may not be all the foods and drinks you should avoid. Talk  to a dietitian to learn more. This information is not intended to replace advice given to you by your health care provider. Make sure you discuss any questions you have with your health care provider. Document Revised: 11/21/2022 Document Reviewed: 11/21/2022 Elsevier Patient Education  2024 [employer].

## 2023-08-26 DIAGNOSIS — M7541 Impingement syndrome of right shoulder: Secondary | ICD-10-CM | POA: Diagnosis not present

## 2023-08-26 DIAGNOSIS — R293 Abnormal posture: Secondary | ICD-10-CM | POA: Diagnosis not present

## 2023-08-26 DIAGNOSIS — M5412 Radiculopathy, cervical region: Secondary | ICD-10-CM | POA: Diagnosis not present

## 2023-08-26 DIAGNOSIS — M25511 Pain in right shoulder: Secondary | ICD-10-CM | POA: Diagnosis not present

## 2023-08-26 DIAGNOSIS — M25611 Stiffness of right shoulder, not elsewhere classified: Secondary | ICD-10-CM | POA: Diagnosis not present

## 2023-08-26 NOTE — Progress Notes (Signed)
[facility] [address] [place] [address] Phone: [phone] Subjective:   Paul [name], am serving as a scribe for Dr. Antoine Primas.  I'm seeing this patient by the request  of:  Noberto Retort, MD  CC: Back and neck pain follow-up  [id number]  Paul [name] is a 72 y.o. male coming in with complaint of back and neck pain. OMT 07/29/2023. Patient states here for manipulation and check on Rotator cuff. Would like to talk about R knee as well.  Medications patient has been prescribed: Cymbalta, Gabapentin, Vit D  Taking:         Reviewed prior external information including notes and imaging from previsou exam, outside providers and external EMR if available.   As well as notes that were available from care everywhere and other healthcare systems.  Past medical history, social, surgical and family history all reviewed in electronic medical record.  No pertanent information unless stated regarding to the chief complaint.   Past Medical History:  Diagnosis Date   Cataract    Depression    RESOLVED   Hyperlipidemia    Spinal stenosis    WAS HAVING NECK PAIN -PT STATES NERVE ABLATION PROCEDURE 2012 AND PAIN HAS RESOLVED   Spondylitis (HCC)     Allergies  Allergen Reactions   Crestor [Rosuvastatin] Other (See Comments)    Myalgias  Pt currently taking 20mg  QD   Lipitor [Atorvastatin] Other (See Comments)    Liver toxicity     Review of Systems:  No headache, visual changes, nausea, vomiting, diarrhea, constipation, dizziness, abdominal pain, skin rash, fevers, chills, night sweats, weight loss, swollen lymph nodes, body aches, joint swelling, chest pain, shortness of breath, mood changes. POSITIVE muscle aches  Objective  Blood pressure 126/82, pulse 66, height 5\' 11"  (1.803 m), weight 166 lb (75.3 kg), SpO2 98%.   General: No apparent distress alert and oriented x3 mood and affect normal, dressed appropriately.   HEENT: Pupils equal, extraocular movements intact  Respiratory: Patient's speak in full sentences and does not appear short of breath  Cardiovascular: No lower extremity edema, non tender, no erythema  Low back significant loss of lordosis.  Neck exam has some limited range of motion in all planes. Right shoulder exam shows some positive impingement noted. Rotator cuff strength does appear to be improved  Limited muscular skeletal ultrasound was performed and interpreted by Antoine Primas, M   Limited ultrasound does show the patient does have some new tendon or hyperechoic changes that is consistent with potential scar tissue formation noted. Osteopathic findings  C2 flexed rotated and side bent right C6 flexed rotated and side bent left T3 extended rotated and side bent right inhaled rib T7 extended rotated and side bent left L3 flexed rotated and side bent right L4 flexed rotated and side bent left Sacrum right on right       Assessment and Plan:  SI (sacroiliac) joint dysfunction Chronic, exacerbation  Discussed icing regimen and home exercises, discussed avoiding certain activities.  Follow-up with me again in 6 to 8 weeks.  Responding well to the osteopathic manipulation today.  Right shoulder pain Patient does have a partial rotator cuff tear but ultrasound does appear to be healing at this time.  Discussed which activities to do and which ones to avoid.  Increase activity slowly otherwise.  Follow-up again in 6 to 8 weeks.    Nonallopathic problems  Decision today to treat with OMT was based on  Physical Exam  After verbal consent patient was treated with HVLA, ME, FPR techniques in cervical, rib, thoracic, lumbar, and sacral  areas avoided HVLA on the neck.  Patient tolerated the procedure well with improvement in symptoms  Patient given exercises, stretches and lifestyle modifications  See medications in patient instructions if given  Patient will follow up in  4-8 weeks    The above documentation has been reviewed and is accurate and complete Judi Saa, DO          Note: This dictation was prepared with Dragon dictation along with smaller phrase technology. Any transcriptional errors that result from this process are unintentional.

## 2023-08-27 ENCOUNTER — Encounter (HOSPITAL_COMMUNITY)
Admission: RE | Admit: 2023-08-27 | Discharge: 2023-08-27 | Disposition: A | Discharge status: Home / Self Care | Payer: Medicare Other | Source: Ambulatory Visit | Attending: Cardiology | Admitting: Cardiology

## 2023-08-27 DIAGNOSIS — I2510 Atherosclerotic heart disease of native coronary artery without angina pectoris: Secondary | ICD-10-CM | POA: Diagnosis not present

## 2023-08-27 DIAGNOSIS — R030 Elevated blood-pressure reading, without diagnosis of hypertension: Secondary | ICD-10-CM | POA: Diagnosis not present

## 2023-08-27 DIAGNOSIS — E785 Hyperlipidemia, unspecified: Secondary | ICD-10-CM | POA: Diagnosis not present

## 2023-08-27 DIAGNOSIS — Z955 Presence of coronary angioplasty implant and graft: Secondary | ICD-10-CM

## 2023-08-27 DIAGNOSIS — I214 Non-ST elevation (NSTEMI) myocardial infarction: Secondary | ICD-10-CM | POA: Diagnosis not present

## 2023-08-27 DIAGNOSIS — Z8249 Family history of ischemic heart disease and other diseases of the circulatory system: Secondary | ICD-10-CM | POA: Diagnosis not present

## 2023-08-27 NOTE — Progress Notes (Signed)
Discharge Progress Report  Patient Details  Name: RUFFUS KAMAKA MRN: [id number] Date of Birth: 1951/07/10 Referring Provider:   Flowsheet Row INTENSIVE CARDIAC REHAB ORIENT from 06/03/2023 in [facility]  Referring Provider Donato Schultz, MD        Number of Visits: 62  Reason for Discharge:  Patient reached a stable level of exercise. Patient independent in their exercise. Patient has met program and personal goals.  Smoking History:  Social History   Tobacco Use  Smoking Status Never  Smokeless Tobacco Never    Diagnosis:  05/10/23 NSTEMI (non-ST elevated myocardial infarction) (HCC)  05/12/23 Status post coronary artery stent placement  ADL UCSD:   Initial Exercise Prescription:  Initial Exercise Prescription - 06/03/23 1100       Date of Initial Exercise RX and Referring Provider   Date 06/03/23    Referring Provider Donato Schultz, MD    Expected Discharge Date 08/20/23      Treadmill   MPH 3.4    Grade 1    Minutes 15    METs 4.07      Bike   Level 3    Watts 50    Minutes 15    METs 4.1      Prescription Details   Frequency (times per week) 3    Duration Progress to 30 minutes of continuous aerobic without signs/symptoms of physical distress      Intensity   THRR 40-80% of Max Heartrate 59-118    Ratings of Perceived Exertion 11-13    Perceived Dyspnea 0-4      Progression   Progression Continue progressive overload as per policy without signs/symptoms or physical distress.      Resistance Training   Training Prescription Yes    Weight 3lbs    Reps 10-15             Discharge Exercise Prescription (Final Exercise Prescription Changes):  Exercise Prescription Changes - 08/27/23 1400       Response to Exercise   Blood Pressure (Admit) 122/68    Blood Pressure (Exercise) 178/74    Blood Pressure (Exit) 132/68    Heart Rate (Admit) 87 bpm    Heart Rate (Exercise) 138 bpm     Heart Rate (Exit) 85 bpm    Rating of Perceived Exertion (Exercise) 14    Symptoms None    Comments Pt graduated from the cRP2 program today.    Duration Continue with 30 min of aerobic exercise without signs/symptoms of physical distress.    Intensity THRR unchanged      Progression   Progression Continue to progress workloads to maintain intensity without signs/symptoms of physical distress.    Average METs 8.82      Resistance Training   Training Prescription No    Weight No weights on wednesdays      Interval Training   Interval Training Yes    Equipment Treadmill    Comments HIIT      Treadmill   MPH 7.4    Grade 1    Minutes 15    METs 12.84      Bike   Level 4.6    Watts 140    Minutes 15    METs 8.1      Home Exercise Plan   Plans to continue exercise at Home (comment)    Frequency Add 2 additional days to program exercise sessions.    Initial Home Exercises Provided  06/25/23             Functional Capacity:  6 Minute Walk     Row Name 06/03/23 1108 08/22/23 1413       6 Minute Walk   Phase Initial Discharge    Distance 1981 feet 2260 feet    Distance % Change -- 14.08 %    Distance Feet Change -- 279 ft    Walk Time 6 minutes 6 minutes    # of Rest Breaks 0 0    MPH 3.8 4.28    METS 4.14 4.86    RPE 11 11    Perceived Dyspnea  0 0    VO2 Peak 14.5 17.01    Symptoms No No    Resting HR 76 bpm 58 bpm    Resting BP 114/72 128/72    Resting Oxygen Saturation  96 % --    Exercise Oxygen Saturation  during 6 min walk 96 % --    Max Ex. HR 99 bpm 109 bpm    Max Ex. BP 120/70 136/72    2 Minute Post BP 112/68 122/70             Psychological, QOL, Others - Outcomes: PHQ 2/9:    08/27/2023   10:04 AM 06/03/2023   10:38 AM 10/16/2015    3:28 PM 09/12/2015    1:57 PM 09/12/2015    1:56 PM  Depression screen PHQ 2/9  Decreased Interest 0 1 0 0 0  Down, Depressed, Hopeless 0 1 0 0 0  PHQ - 2 Score 0 2 0 0 0  Altered sleeping 0 0      Tired, decreased energy 0 1     Change in appetite 0 1     Feeling bad or failure about yourself  0 0     Trouble concentrating 0 0     Moving slowly or fidgety/restless 0 0     Suicidal thoughts 0 0     PHQ-9 Score 0 4     Difficult doing work/chores Not difficult at all Not difficult at all       Quality of Life:  Quality of Life - 08/27/23 0831       Quality of Life   Select Quality of Life      Quality of Life Scores   Health/Function Pre 24.83 %    Health/Function Post 27.3 %    Health/Function % Change 9.95 %    Socioeconomic Pre 25.6 %    Socioeconomic Post 26.64 %    Socioeconomic % Change  4.06 %    Psych/Spiritual Pre 25.75 %    Psych/Spiritual Post 25.75 %    Psych/Spiritual % Change 0 %    Family Pre 26.4 %    Family Post 26.4 %    Family % Change 0 %    GLOBAL Pre 25.39 %    GLOBAL Post 26.24 %    GLOBAL % Change 3.35 %             Personal Goals: Goals established at orientation with interventions provided to work toward goal.  Personal Goals and Risk Factors at Admission - 06/03/23 1041       Core Components/Risk Factors/Patient Goals on Admission   Lipids Yes    Intervention Provide education and support for participant on nutrition & aerobic/resistive exercise along with prescribed medications to achieve LDL 70mg , HDL >40mg .    Expected Outcomes Short Term: Participant states understanding of desired  cholesterol values and is compliant with medications prescribed. Participant is following exercise prescription and nutrition guidelines.;Long Term: Cholesterol controlled with medications as prescribed, with individualized exercise RX and with personalized nutrition plan. Value goals: LDL < 70mg , HDL > 40 mg.    Stress Yes    Intervention Offer individual and/or small group education and counseling on adjustment to heart disease, stress management and health-related lifestyle change. Teach and support self-help strategies.;Refer participants  experiencing significant psychosocial distress to appropriate mental health specialists for further evaluation and treatment. When possible, include family members and significant others in education/counseling sessions.    Expected Outcomes Long Term: Emotional wellbeing is indicated by absence of clinically significant psychosocial distress or social isolation.;Short Term: Participant demonstrates changes in health-related behavior, relaxation and other stress management skills, ability to obtain effective social support, and compliance with psychotropic medications if prescribed.              Personal Goals Discharge:  Goals and Risk Factor Review     Row Name 06/09/23 0919 06/18/23 9528 07/15/23 0925 08/12/23 1118 09/04/23 0909     Core Components/Risk Factors/Patient Goals Review   Personal Goals Review Lipids;Stress Lipids;Stress Lipids;Stress Lipids;Stress Lipids;Stress   Review Harbor started cardiac rehab on 06/09/23 and did well with exercise. Vital signs were stable Renald started cardiac rehab on 06/09/23 and is off to a good start to exercise.  Vital signs have been  stable Adeel is doing very well with exercise.  Vital signs have been  stable. Linton is now jogging and ask asked for target hear rate increases as Jaelen wants to participate in triathalons again. Grame has had no reports of angina with exercise. Zamarian is doing very well with exercise.  Vital signs have been. Grame has had no reports of angina with exercise. Davell was absent for 10 days due to a recent COVID 19 infection. Tarance has lost 1.3 kg since starting cardiac rehab. Garth continius to run and exercise at a high met level. Raesean did  very well with exercise.  Vital signs have been stable . Adisa has lost 1.9 kg since starting cardiac rehab. Taseen completed cardiac rehab on 08/27/23   Expected Outcomes Elvan will continue to participate in intensive cardiac rehab for exercise, nutrition and lifestyle  modifications Ziere will continue to participate in intensive cardiac rehab for exercise, nutrition and lifestyle modifications Camauri will continue to participate in intensive cardiac rehab for exercise, nutrition and lifestyle modifications Jaggar will continue to participate in intensive cardiac rehab for exercise, nutrition and lifestyle modifications Khayden will continue to train for his triathalons , follow nutrition and lifestyle modifications upon completion of cardiac rehab.            Exercise Goals and Review:  Exercise Goals     Row Name 06/03/23 1109             Exercise Goals   Increase Physical Activity Yes       Intervention Provide advice, education, support and counseling about physical activity/exercise needs.;Develop an individualized exercise prescription for aerobic and resistive training based on initial evaluation findings, risk stratification, comorbidities and participant's personal goals.       Expected Outcomes Short Term: Attend rehab on a regular basis to increase amount of physical activity.;Long Term: Add in home exercise to make exercise part of routine and to increase amount of physical activity.;Long Term: Exercising regularly at least 3-5 days a week.       Increase Strength and Stamina Yes  Intervention Provide advice, education, support and counseling about physical activity/exercise needs.;Develop an individualized exercise prescription for aerobic and resistive training based on initial evaluation findings, risk stratification, comorbidities and participant's personal goals.       Expected Outcomes Short Term: Increase workloads from initial exercise prescription for resistance, speed, and METs.;Short Term: Perform resistance training exercises routinely during rehab and add in resistance training at home;Long Term: Improve cardiorespiratory fitness, muscular endurance and strength as measured by increased METs and functional capacity ( )        Able to understand and use rate of perceived exertion (RPE) scale Yes       Intervention Provide education and explanation on how to use RPE scale       Expected Outcomes Short Term: Able to use RPE daily in rehab to express subjective intensity level;Long Term:  Able to use RPE to guide intensity level when exercising independently       Knowledge and understanding of Target Heart Rate Range (THRR) Yes       Intervention Provide education and explanation of THRR including how the numbers were predicted and where they are located for reference       Expected Outcomes Short Term: Able to state/look up THRR;Long Term: Able to use THRR to govern intensity when exercising independently;Short Term: Able to use daily as guideline for intensity in rehab       Understanding of Exercise Prescription Yes       Intervention Provide education, explanation, and written materials on patient's individual exercise prescription       Expected Outcomes Short Term: Able to explain program exercise prescription;Long Term: Able to explain home exercise prescription to exercise independently                Exercise Goals Re-Evaluation:  Exercise Goals Re-Evaluation     Row Name 06/09/23 1641 07/04/23 1642 08/04/23 1030 08/27/23 1425       Exercise Goal Re-Evaluation   Exercise Goals Review Increase Physical Activity;Able to understand and use Dyspnea scale;Increase Strength and Stamina;Knowledge and understanding of Target Heart Rate Range (THRR);Able to understand and use rate of perceived exertion (RPE) scale Increase Physical Activity;Able to understand and use Dyspnea scale;Increase Strength and Stamina;Knowledge and understanding of Target Heart Rate Range (THRR);Able to understand and use rate of perceived exertion (RPE) scale Increase Physical Activity;Able to understand and use Dyspnea scale;Increase Strength and Stamina;Knowledge and understanding of Target Heart Rate Range (THRR);Able to understand and  use rate of perceived exertion (RPE) scale Increase Physical Activity;Able to understand and use Dyspnea scale;Increase Strength and Stamina;Knowledge and understanding of Target Heart Rate Range (THRR);Able to understand and use rate of perceived exertion (RPE) scale    Comments Pt's first day in the CRP2 program. Pt understnads the exercise Rx, THRR and RPE scale. Reviewed METs and goals. Pt is making excellent progress with peak METs of 9.9 and average METs of 7.6.  Pt voices progress on his goal of gaining confidence to train. Pt is exercise at home on off days from the CRP2 program. Reviewed METs and goals. Pt  is making good progress. Peak METs 9.9, average is 7.7. Pt is returning to exercise after a bout of COVID. Pt is training for his triathlon and Dr. Anne Fu has raised his THR to 150 to allow his to train more effectively. Pt graduated from the CRP2 program today. Pt had peak METs of 12.84. Pt has gained the confidence to resume training for his triathalon. He goes next week  for a race and will head later this year to qualify.    Expected Outcomes Will continue to monitor and progress exercise workloads as toelrated. Will continue to monitor and progress exercise workloads as toelrated. Will continue to monitor and progress exercise workloads as toelrated. Pt will continue to exercise and train at home.             Nutrition & Weight - Outcomes:  Pre Biometrics - 06/03/23 1110       Pre Biometrics   Waist Circumference 37.5 inches    Hip Circumference 37.75 inches    Waist to Hip Ratio 0.99 %    Triceps Skinfold 7 mm    % Body Fat 21.9 %    Grip Strength 36 kg    Flexibility 14.25 in    Single Leg Stand 30 seconds             Post Biometrics - 08/22/23 1415        Post  Biometrics   Height 5\' 11"  (1.803 m)    Weight 76.6 kg    Waist Circumference 35.5 inches    Hip Circumference 36.75 inches    Waist to Hip Ratio 0.97 %    BMI (Calculated) 23.56    Triceps Skinfold 7  mm    % Body Fat 20.8 %    Grip Strength 42 kg    Flexibility 15.5 in    Single Leg Stand 30 seconds             Nutrition:  Nutrition Therapy & Goals - 08/11/23 1629       Nutrition Therapy   Diet Heart Healthy Diet    Drug/Food Interactions Statins/Certain Fruits      Personal Nutrition Goals   Nutrition Goal Patient to identify strategies for reducing cardiovascular risk by attending the [facility] education and nutrition series weekly.   Goal in action.   Personal Goal #2 Patient to improve diet quality by using the plate method as a guide for meal planning to include lean protein/plant protein, fruits, vegetables, whole grains, nonfat dairy as part of a well-balanced diet.   goal in action.   Comments Goals in action Marcas continues to attend the [facility] education and nutrition series regularly. He has good understanding of fiber recommendations, sodium recommendations, and saturated fat intake. Purl is motivated to get back to training for half iron man races. He does report that his race day weight is ~152#. He is tracking his intake with MyFitness Pal to aid with weight loss; answered patient questions regarding macronutrients, calorie needs, etc. He is down 2.9# since starting with our program. Patient will benefit from participation in intensive cardiac rehab for nutrition, exercise, and lifestyle modification.      Intervention Plan   Intervention Prescribe, educate and counsel regarding individualized specific dietary modifications aiming towards targeted core components such as weight, hypertension, lipid management, diabetes, heart failure and other comorbidities.;Nutrition handout(s) given to patient.    Expected Outcomes Short Term Goal: Understand basic principles of dietary content, such as calories, fat, sodium, cholesterol and nutrients.;Long Term Goal: Adherence to prescribed nutrition plan.;Short Term Goal: A plan has been developed with personal nutrition goals set  during dietitian appointment.             Nutrition Discharge:  Nutrition Assessments - 08/27/23 1550       Rate Your Plate Scores   Pre Score 63    Post Score 77  Education Questionnaire Score:  Knowledge Questionnaire Score - 08/27/23 0831       Knowledge Questionnaire Score   Post Score 22/24             Goals reviewed with patient; copy given to patient.Pt graduates from  Intensive/Traditional cardiac rehab program on 08/27/23  with completion of  31 exercise and 31  education sessions. Pt maintained good attendance and progressed nicely during their participation in rehab as evidenced by increased MET level. Aston increased his distance on his post exercise walk test by 279 feet and lost 1.9 kg.  Medication list reconciled. Repeat  PHQ score- 0 .  Pt has made significant lifestyle changes and should be commended for their success. Santez achieved their goals during cardiac rehab.   Pt plans to continue exercise by continuing to train for his triathlons. Joab says that participating in Cardiac Rehab has been helpful. We are proud of Kewon's progress!Thayer Headings RN BSN

## 2023-08-28 DIAGNOSIS — M25611 Stiffness of right shoulder, not elsewhere classified: Secondary | ICD-10-CM | POA: Diagnosis not present

## 2023-08-28 DIAGNOSIS — M5412 Radiculopathy, cervical region: Secondary | ICD-10-CM | POA: Diagnosis not present

## 2023-08-28 DIAGNOSIS — M7541 Impingement syndrome of right shoulder: Secondary | ICD-10-CM | POA: Diagnosis not present

## 2023-08-28 DIAGNOSIS — M25511 Pain in right shoulder: Secondary | ICD-10-CM | POA: Diagnosis not present

## 2023-08-28 DIAGNOSIS — R293 Abnormal posture: Secondary | ICD-10-CM | POA: Diagnosis not present

## 2023-09-02 ENCOUNTER — Ambulatory Visit (INDEPENDENT_AMBULATORY_CARE_PROVIDER_SITE_OTHER): Payer: Medicare Other | Admitting: Family Medicine

## 2023-09-02 ENCOUNTER — Encounter: Payer: Self-pay | Admitting: Family Medicine

## 2023-09-02 ENCOUNTER — Other Ambulatory Visit: Payer: Self-pay

## 2023-09-02 VITALS — BP 126/82 | HR 66 | Ht 71.0 in | Wt 166.0 lb

## 2023-09-02 DIAGNOSIS — M7541 Impingement syndrome of right shoulder: Secondary | ICD-10-CM | POA: Diagnosis not present

## 2023-09-02 DIAGNOSIS — M9904 Segmental and somatic dysfunction of sacral region: Secondary | ICD-10-CM

## 2023-09-02 DIAGNOSIS — G8929 Other chronic pain: Secondary | ICD-10-CM

## 2023-09-02 DIAGNOSIS — M9901 Segmental and somatic dysfunction of cervical region: Secondary | ICD-10-CM

## 2023-09-02 DIAGNOSIS — M25511 Pain in right shoulder: Secondary | ICD-10-CM

## 2023-09-02 DIAGNOSIS — M9908 Segmental and somatic dysfunction of rib cage: Secondary | ICD-10-CM

## 2023-09-02 DIAGNOSIS — R293 Abnormal posture: Secondary | ICD-10-CM | POA: Diagnosis not present

## 2023-09-02 DIAGNOSIS — M5412 Radiculopathy, cervical region: Secondary | ICD-10-CM | POA: Diagnosis not present

## 2023-09-02 DIAGNOSIS — M9903 Segmental and somatic dysfunction of lumbar region: Secondary | ICD-10-CM

## 2023-09-02 DIAGNOSIS — M25611 Stiffness of right shoulder, not elsewhere classified: Secondary | ICD-10-CM | POA: Diagnosis not present

## 2023-09-02 DIAGNOSIS — M533 Sacrococcygeal disorders, not elsewhere classified: Secondary | ICD-10-CM

## 2023-09-02 DIAGNOSIS — M9902 Segmental and somatic dysfunction of thoracic region: Secondary | ICD-10-CM | POA: Diagnosis not present

## 2023-09-02 NOTE — Assessment & Plan Note (Signed)
Chronic, exacerbation  Discussed icing regimen and home exercises, discussed avoiding certain activities.  Follow-up with me again in 6 to 8 weeks.  Responding well to the osteopathic manipulation today.

## 2023-09-02 NOTE — Patient Instructions (Signed)
Good to see you! GO kick ass See you again in December

## 2023-09-02 NOTE — Assessment & Plan Note (Signed)
Patient does have a partial rotator cuff tear but ultrasound does appear to be healing at this time.  Discussed which activities to do and which ones to avoid.  Increase activity slowly otherwise.  Follow-up again in 6 to 8 weeks.

## 2023-09-04 DIAGNOSIS — R293 Abnormal posture: Secondary | ICD-10-CM | POA: Diagnosis not present

## 2023-09-04 DIAGNOSIS — M5412 Radiculopathy, cervical region: Secondary | ICD-10-CM | POA: Diagnosis not present

## 2023-09-04 DIAGNOSIS — M7541 Impingement syndrome of right shoulder: Secondary | ICD-10-CM | POA: Diagnosis not present

## 2023-09-04 DIAGNOSIS — M25611 Stiffness of right shoulder, not elsewhere classified: Secondary | ICD-10-CM | POA: Diagnosis not present

## 2023-09-04 DIAGNOSIS — M25511 Pain in right shoulder: Secondary | ICD-10-CM | POA: Diagnosis not present

## 2023-09-09 DIAGNOSIS — M5412 Radiculopathy, cervical region: Secondary | ICD-10-CM | POA: Diagnosis not present

## 2023-09-09 DIAGNOSIS — M25511 Pain in right shoulder: Secondary | ICD-10-CM | POA: Diagnosis not present

## 2023-09-09 DIAGNOSIS — R293 Abnormal posture: Secondary | ICD-10-CM | POA: Diagnosis not present

## 2023-09-09 DIAGNOSIS — M25611 Stiffness of right shoulder, not elsewhere classified: Secondary | ICD-10-CM | POA: Diagnosis not present

## 2023-09-09 DIAGNOSIS — M7541 Impingement syndrome of right shoulder: Secondary | ICD-10-CM | POA: Diagnosis not present

## 2023-09-11 DIAGNOSIS — R293 Abnormal posture: Secondary | ICD-10-CM | POA: Diagnosis not present

## 2023-09-11 DIAGNOSIS — M25511 Pain in right shoulder: Secondary | ICD-10-CM | POA: Diagnosis not present

## 2023-09-11 DIAGNOSIS — M25611 Stiffness of right shoulder, not elsewhere classified: Secondary | ICD-10-CM | POA: Diagnosis not present

## 2023-09-11 DIAGNOSIS — M7541 Impingement syndrome of right shoulder: Secondary | ICD-10-CM | POA: Diagnosis not present

## 2023-09-11 DIAGNOSIS — M5412 Radiculopathy, cervical region: Secondary | ICD-10-CM | POA: Diagnosis not present

## 2023-09-23 ENCOUNTER — Other Ambulatory Visit: Payer: Self-pay | Admitting: Family Medicine

## 2023-09-23 DIAGNOSIS — M7541 Impingement syndrome of right shoulder: Secondary | ICD-10-CM | POA: Diagnosis not present

## 2023-09-23 DIAGNOSIS — M25511 Pain in right shoulder: Secondary | ICD-10-CM | POA: Diagnosis not present

## 2023-09-23 DIAGNOSIS — R293 Abnormal posture: Secondary | ICD-10-CM | POA: Diagnosis not present

## 2023-09-23 DIAGNOSIS — M5412 Radiculopathy, cervical region: Secondary | ICD-10-CM | POA: Diagnosis not present

## 2023-09-23 DIAGNOSIS — M25611 Stiffness of right shoulder, not elsewhere classified: Secondary | ICD-10-CM | POA: Diagnosis not present

## 2023-09-24 ENCOUNTER — Other Ambulatory Visit (HOSPITAL_COMMUNITY): Payer: Self-pay

## 2023-09-24 MED ORDER — VITAMIN D (ERGOCALCIFEROL) 1.25 MG (50000 UNIT) PO CAPS
50000.0000 [IU] | ORAL_CAPSULE | ORAL | 0 refills | Status: DC
  Filled 2023-09-24: qty 12, 84d supply, fill #0

## 2023-09-30 DIAGNOSIS — M7541 Impingement syndrome of right shoulder: Secondary | ICD-10-CM | POA: Diagnosis not present

## 2023-09-30 DIAGNOSIS — M5412 Radiculopathy, cervical region: Secondary | ICD-10-CM | POA: Diagnosis not present

## 2023-09-30 DIAGNOSIS — M25511 Pain in right shoulder: Secondary | ICD-10-CM | POA: Diagnosis not present

## 2023-09-30 DIAGNOSIS — M25611 Stiffness of right shoulder, not elsewhere classified: Secondary | ICD-10-CM | POA: Diagnosis not present

## 2023-09-30 DIAGNOSIS — R293 Abnormal posture: Secondary | ICD-10-CM | POA: Diagnosis not present

## 2023-10-02 DIAGNOSIS — M25511 Pain in right shoulder: Secondary | ICD-10-CM | POA: Diagnosis not present

## 2023-10-02 DIAGNOSIS — M5412 Radiculopathy, cervical region: Secondary | ICD-10-CM | POA: Diagnosis not present

## 2023-10-02 DIAGNOSIS — R293 Abnormal posture: Secondary | ICD-10-CM | POA: Diagnosis not present

## 2023-10-02 DIAGNOSIS — M25611 Stiffness of right shoulder, not elsewhere classified: Secondary | ICD-10-CM | POA: Diagnosis not present

## 2023-10-02 DIAGNOSIS — M7541 Impingement syndrome of right shoulder: Secondary | ICD-10-CM | POA: Diagnosis not present

## 2023-10-07 DIAGNOSIS — M25611 Stiffness of right shoulder, not elsewhere classified: Secondary | ICD-10-CM | POA: Diagnosis not present

## 2023-10-07 DIAGNOSIS — R293 Abnormal posture: Secondary | ICD-10-CM | POA: Diagnosis not present

## 2023-10-07 DIAGNOSIS — M5412 Radiculopathy, cervical region: Secondary | ICD-10-CM | POA: Diagnosis not present

## 2023-10-07 DIAGNOSIS — M25511 Pain in right shoulder: Secondary | ICD-10-CM | POA: Diagnosis not present

## 2023-10-07 DIAGNOSIS — M7541 Impingement syndrome of right shoulder: Secondary | ICD-10-CM | POA: Diagnosis not present

## 2023-10-08 ENCOUNTER — Ambulatory Visit (INDEPENDENT_AMBULATORY_CARE_PROVIDER_SITE_OTHER): Payer: Medicare Other | Admitting: Family Medicine

## 2023-10-08 ENCOUNTER — Encounter: Payer: Self-pay | Admitting: Family Medicine

## 2023-10-08 VITALS — BP 122/76 | HR 93 | Ht 71.0 in | Wt 165.0 lb

## 2023-10-08 DIAGNOSIS — M9902 Segmental and somatic dysfunction of thoracic region: Secondary | ICD-10-CM | POA: Diagnosis not present

## 2023-10-08 DIAGNOSIS — M9908 Segmental and somatic dysfunction of rib cage: Secondary | ICD-10-CM

## 2023-10-08 DIAGNOSIS — M5030 Other cervical disc degeneration, unspecified cervical region: Secondary | ICD-10-CM

## 2023-10-08 DIAGNOSIS — M9901 Segmental and somatic dysfunction of cervical region: Secondary | ICD-10-CM

## 2023-10-08 DIAGNOSIS — M25511 Pain in right shoulder: Secondary | ICD-10-CM

## 2023-10-08 MED ORDER — GABAPENTIN 100 MG PO CAPS: 200.0000 mg | ORAL_CAPSULE | Freq: Three times a day (TID) | ORAL | 0 refills | Status: DC

## 2023-10-08 MED ORDER — METHYLPREDNISOLONE ACETATE 40 MG/ML IJ SUSP
40.0000 mg | Freq: Once | INTRAMUSCULAR | Status: AC
Start: 2023-10-08 — End: 2023-10-08
  Administered 2023-10-08: 40 mg via INTRAMUSCULAR

## 2023-10-08 NOTE — Progress Notes (Unsigned)
[facility] [address] [place] [address] Phone: [phone] Subjective:   Bruce Donath, am serving as a scribe for Dr. Antoine Primas.  I'm seeing this patient by the request  of:  Noberto Retort, MD  CC: right shoulder and neck pain   [id number]  KASYN BRAU is a 72 y.o. male coming in with complaint of R shoulder and neck pain. Patient states that his pain has improved with PT and time. He is able to swim and do a lot of exericise that he has not been able to do. Swam on Saturday and then rode for 2 hours in aero position. Pain radiates from R side of neck down the R arm. Neck also got very stiff and he was unable to side bend very far. Has a lot of soreness in the R deltoid. Feels like he has plateaued. Next race is December 12th doing a 1/2 iron man.        Past Medical History:  Diagnosis Date   Cataract    Depression    RESOLVED   Hyperlipidemia    Spinal stenosis    WAS HAVING NECK PAIN -PT STATES NERVE ABLATION PROCEDURE 2012 AND PAIN HAS RESOLVED   Spondylitis (HCC)    Past Surgical History:  Procedure Laterality Date   APPENDECTOMY  1962   CORONARY PRESSURE/FFR STUDY N/A 05/12/2023   Procedure: CORONARY PRESSURE/FFR STUDY;  Surgeon: Marykay Lex, MD;  Location: [facility] INVASIVE CV LAB;  Service: Cardiovascular;  Laterality: N/A;   CORONARY STENT INTERVENTION N/A 05/12/2023   Procedure: CORONARY STENT INTERVENTION;  Surgeon: Marykay Lex, MD;  Location: [facility] INVASIVE CV LAB;  Service: Cardiovascular;  Laterality: N/A;   HERNIA REPAIR     INSERTION OF MESH  10/06/2012   Procedure: INSERTION OF MESH;  Surgeon: [name]. Corliss Skains, MD;  Location: [facility];  Service: General;  Laterality: N/A;   KNEE SURGERY  1980 & 1982   left   LEFT HEART CATH AND CORONARY ANGIOGRAPHY N/A 05/12/2023   Procedure: LEFT HEART CATH AND CORONARY ANGIOGRAPHY;  Surgeon: Marykay Lex, MD;  Location: [facility] INVASIVE CV LAB;  Service:  Cardiovascular;  Laterality: N/A;   TONSILLECTOMY  1968   UMBILICAL HERNIA REPAIR  10/06/2012   Procedure: HERNIA REPAIR UMBILICAL ADULT;  Surgeon: [name]. Corliss Skains, MD;  Location: [facility];  Service: General;  Laterality: N/A;   Social History   Socioeconomic History   Marital status: Married    Spouse name: Not on file   Number of children: Not on file   Years of education: Not on file   Highest education level: Not on file  Occupational History   Not on file  Tobacco Use   Smoking status: Never   Smokeless tobacco: Never  Vaping Use   Vaping status: Never Used  Substance and Sexual Activity   Alcohol use: Yes    Comment: OCCAS ALCOHOL   Drug use: No   Sexual activity: Not on file  Other Topics Concern   Not on file  Social History Narrative   ** Merged History Encounter **       Social Determinants of Health   Financial Resource Strain: Not on file  Food Insecurity: No Food Insecurity (05/10/2023)   Hunger Vital Sign    Worried About Running Out of Food in the Last Year: Never true    Ran Out of Food in the Last Year: Never true  Transportation Needs: No Transportation  Needs (05/10/2023)   PRAPARE - Administrator, Civil Service (Medical): No    Lack of Transportation (Non-Medical): No  Physical Activity: Not on file  Stress: Not on file  Social Connections: Not on file   Allergies  Allergen Reactions   Crestor [Rosuvastatin] Other (See Comments)    Myalgias  Pt currently taking 20mg  QD   Lipitor [Atorvastatin] Other (See Comments)    Liver toxicity   Family History  Problem Relation Age of Onset   Stroke Mother    Heart disease Father    Cancer Sister        bone     Current Outpatient Medications (Cardiovascular):    isosorbide mononitrate (IMDUR) 30 MG 24 hr tablet, Take 1 tablet (30 mg total) by mouth daily.   nitroGLYCERIN (NITROSTAT) 0.4 MG SL tablet, Place 1 tablet (0.4 mg total) under the tongue every 5 (five) minutes x 3 doses as  needed for chest pain.   rosuvastatin (CRESTOR) 20 MG tablet, Take 1 tablet (20 mg total) by mouth daily.   sildenafil (VIAGRA) 100 MG tablet, Take 100 mg by mouth daily as needed for erectile dysfunction.   Current Outpatient Medications (Analgesics):    aspirin EC 81 MG tablet, Take 1 tablet (81 mg total) by mouth daily. Swallow whole.  Current Outpatient Medications (Hematological):    prasugrel (EFFIENT) 10 MG TABS tablet, Take 6 tablets by mouth 24 hours after last Brilinta dose. Then 24 hours later take 1 tablet (10mg ) by mouth daily. (Patient taking differently: Take 6 tablets by mouth 24)  Current Outpatient Medications (Other):    gabapentin (NEURONTIN) 100 MG capsule, Take 2 capsules (200 mg total) by mouth 3 (three) times daily.   DULoxetine (CYMBALTA) 60 MG capsule, Take 1 capsule (60 mg total) by mouth daily.   Vitamin D, Ergocalciferol, (DRISDOL) 1.25 MG (50000 UNIT) CAPS capsule, Take 1 capsule (50,000 Units total) by mouth every 7 (seven) days.   Reviewed prior external information including notes and imaging from  primary care provider As well as notes that were available from care everywhere and other healthcare systems.  Past medical history, social, surgical and family history all reviewed in electronic medical record.  No pertanent information unless stated regarding to the chief complaint.   Review of Systems:  No headache, visual changes, nausea, vomiting, diarrhea, constipation, dizziness, abdominal pain, skin rash, fevers, chills, night sweats, weight loss, swollen lymph nodes, body aches, joint swelling, chest pain, shortness of breath, mood changes. POSITIVE muscle aches  Objective  Blood pressure 122/76, pulse 93, height 5\' 11"  (1.803 m), weight 165 lb (74.8 kg), SpO2 92%.   General: No apparent distress alert and oriented x3 mood and affect normal, dressed appropriately.  HEENT: Pupils equal, extraocular movements intact  Respiratory: Patient's speak in  full sentences and does not appear short of breath  Cardiovascular: No lower extremity edema, non tender, no erythema  Neck exam does have significant loss of lordosis.  Minimal to no sidebending noted.  Still has some difficulty with extension only 10 degrees of the neck.  Likely no radicular symptoms but does seem to be painful.  Neurovascular intact distally.  Osteopathic findings C2 flexed rotated and side bent right C4 flexed rotated and side bent left C6 flexed rotated and side bent left T3 extended rotated and side bent right inhaled third rib T9 extended rotated and side bent left     Impression and Recommendations:    The above documentation has been reviewed  and is accurate and complete Judi Saa, DO   Degenerative disc disease, cervical Significant arthritic changes noted.  Discussed icing regimen and home exercises, discussed avoiding certain activities.  We discussed different medications and increasing the gabapentin.  200 mg 3 times a day.  Discussed and warned of potential side effects.  Increase activity slowly.  Follow-up again in 6 to 8 weeks    Decision today to treat with OMT was based on Physical Exam  After verbal consent patient was treated with HVLA, ME, FPR techniques in cervical, thoracic, rib areas, all areas are chronic   Patient tolerated the procedure well with improvement in symptoms  Patient given exercises, stretches and lifestyle modifications  See medications in patient instructions if given  Patient will follow up in 4-8 weeks

## 2023-10-08 NOTE — Patient Instructions (Addendum)
Gabapentin 200mg  TID Try titration we discussed Keep appt in December

## 2023-10-09 ENCOUNTER — Encounter: Payer: Self-pay | Admitting: Family Medicine

## 2023-10-09 DIAGNOSIS — M25511 Pain in right shoulder: Secondary | ICD-10-CM | POA: Diagnosis not present

## 2023-10-09 DIAGNOSIS — M25611 Stiffness of right shoulder, not elsewhere classified: Secondary | ICD-10-CM | POA: Diagnosis not present

## 2023-10-09 DIAGNOSIS — R293 Abnormal posture: Secondary | ICD-10-CM | POA: Diagnosis not present

## 2023-10-09 DIAGNOSIS — M7541 Impingement syndrome of right shoulder: Secondary | ICD-10-CM | POA: Diagnosis not present

## 2023-10-09 DIAGNOSIS — M5412 Radiculopathy, cervical region: Secondary | ICD-10-CM | POA: Diagnosis not present

## 2023-10-09 NOTE — Assessment & Plan Note (Addendum)
Significant arthritic changes noted.  Discussed icing regimen and home exercises, discussed avoiding certain activities.  We discussed different medications and increasing the gabapentin.  200 mg 3 times a day.  Discussed and warned of potential side effects.  Increase activity slowly.  Follow-up again in 6 to 8 weeks Depo-Medrol injection given today for flare

## 2023-10-14 DIAGNOSIS — R293 Abnormal posture: Secondary | ICD-10-CM | POA: Diagnosis not present

## 2023-10-14 DIAGNOSIS — M25611 Stiffness of right shoulder, not elsewhere classified: Secondary | ICD-10-CM | POA: Diagnosis not present

## 2023-10-14 DIAGNOSIS — M5412 Radiculopathy, cervical region: Secondary | ICD-10-CM | POA: Diagnosis not present

## 2023-10-14 DIAGNOSIS — M7541 Impingement syndrome of right shoulder: Secondary | ICD-10-CM | POA: Diagnosis not present

## 2023-10-14 DIAGNOSIS — M25511 Pain in right shoulder: Secondary | ICD-10-CM | POA: Diagnosis not present

## 2023-10-17 ENCOUNTER — Encounter: Payer: Self-pay | Admitting: Family Medicine

## 2023-10-21 ENCOUNTER — Encounter: Payer: Self-pay | Admitting: Family Medicine

## 2023-10-21 ENCOUNTER — Ambulatory Visit: Payer: Medicare Other | Admitting: Family Medicine

## 2023-10-21 ENCOUNTER — Other Ambulatory Visit: Payer: Self-pay

## 2023-10-21 VITALS — BP 110/74 | HR 68 | Ht 71.0 in | Wt 165.0 lb

## 2023-10-21 DIAGNOSIS — M5412 Radiculopathy, cervical region: Secondary | ICD-10-CM | POA: Diagnosis not present

## 2023-10-21 DIAGNOSIS — M25511 Pain in right shoulder: Secondary | ICD-10-CM | POA: Diagnosis not present

## 2023-10-21 DIAGNOSIS — G5782 Other specified mononeuropathies of left lower limb: Secondary | ICD-10-CM | POA: Diagnosis not present

## 2023-10-21 DIAGNOSIS — M7541 Impingement syndrome of right shoulder: Secondary | ICD-10-CM | POA: Diagnosis not present

## 2023-10-21 DIAGNOSIS — M25611 Stiffness of right shoulder, not elsewhere classified: Secondary | ICD-10-CM | POA: Diagnosis not present

## 2023-10-21 DIAGNOSIS — R293 Abnormal posture: Secondary | ICD-10-CM | POA: Diagnosis not present

## 2023-10-21 NOTE — Patient Instructions (Signed)
Do prescribed exercises at least 3x a week [facility] See you again in 6-8 weeks

## 2023-10-21 NOTE — Progress Notes (Signed)
[facility] [address] [place] [address] Phone: [phone] Subjective:   INadine Counts, am serving as a scribe for Dr. Antoine Primas.  I'm seeing this patient by the request  of:  Noberto Retort, MD  CC: Neck and shoulder pain follow-up  [id number]  Paul [name] is a 72 y.o. male coming in with complaint of neck and shoulder pain. Has been taking gabapentin, and it has been helping. Getting numbness in both feet near 5th toes. After last visit, the injection and adjustment made him feel great for about 10 days.       Past Medical History:  Diagnosis Date   Cataract    Depression    RESOLVED   Hyperlipidemia    Spinal stenosis    WAS HAVING NECK PAIN -PT STATES NERVE ABLATION PROCEDURE 2012 AND PAIN HAS RESOLVED   Spondylitis (HCC)    Past Surgical History:  Procedure Laterality Date   APPENDECTOMY  1962   CORONARY PRESSURE/FFR STUDY N/A 05/12/2023   Procedure: CORONARY PRESSURE/FFR STUDY;  Surgeon: Marykay Lex, MD;  Location: [facility] INVASIVE CV LAB;  Service: Cardiovascular;  Laterality: N/A;   CORONARY STENT INTERVENTION N/A 05/12/2023   Procedure: CORONARY STENT INTERVENTION;  Surgeon: Marykay Lex, MD;  Location: [facility] INVASIVE CV LAB;  Service: Cardiovascular;  Laterality: N/A;   HERNIA REPAIR     INSERTION OF MESH  10/06/2012   Procedure: INSERTION OF MESH;  Surgeon: [name]. Corliss Skains, MD;  Location: [facility];  Service: General;  Laterality: N/A;   KNEE SURGERY  1980 & 1982   left   LEFT HEART CATH AND CORONARY ANGIOGRAPHY N/A 05/12/2023   Procedure: LEFT HEART CATH AND CORONARY ANGIOGRAPHY;  Surgeon: Marykay Lex, MD;  Location: [facility] INVASIVE CV LAB;  Service: Cardiovascular;  Laterality: N/A;   TONSILLECTOMY  1968   UMBILICAL HERNIA REPAIR  10/06/2012   Procedure: HERNIA REPAIR UMBILICAL ADULT;  Surgeon: [name]. Corliss Skains, MD;  Location: [facility];  Service: General;  Laterality: N/A;   Social History    Socioeconomic History   Marital status: Married    Spouse name: Not on file   Number of children: Not on file   Years of education: Not on file   Highest education level: Not on file  Occupational History   Not on file  Tobacco Use   Smoking status: Never   Smokeless tobacco: Never  Vaping Use   Vaping status: Never Used  Substance and Sexual Activity   Alcohol use: Yes    Comment: OCCAS ALCOHOL   Drug use: No   Sexual activity: Not on file  Other Topics Concern   Not on file  Social History Narrative   ** Merged History Encounter **       Social Determinants of Health   Financial Resource Strain: Not on file  Food Insecurity: No Food Insecurity (05/10/2023)   Hunger Vital Sign    Worried About Running Out of Food in the Last Year: Never true    Ran Out of Food in the Last Year: Never true  Transportation Needs: No Transportation Needs (05/10/2023)   PRAPARE - Administrator, Civil Service (Medical): No    Lack of Transportation (Non-Medical): No  Physical Activity: Not on file  Stress: Not on file  Social Connections: Not on file   Allergies  Allergen Reactions   Crestor [Rosuvastatin] Other (See Comments)    Myalgias  Pt currently taking 20mg   QD   Lipitor [Atorvastatin] Other (See Comments)    Liver toxicity   Family History  Problem Relation Age of Onset   Stroke Mother    Heart disease Father    Cancer Sister        bone     Current Outpatient Medications (Cardiovascular):    isosorbide mononitrate (IMDUR) 30 MG 24 hr tablet, Take 1 tablet (30 mg total) by mouth daily.   nitroGLYCERIN (NITROSTAT) 0.4 MG SL tablet, Place 1 tablet (0.4 mg total) under the tongue every 5 (five) minutes x 3 doses as needed for chest pain.   rosuvastatin (CRESTOR) 20 MG tablet, Take 1 tablet (20 mg total) by mouth daily.   sildenafil (VIAGRA) 100 MG tablet, Take 100 mg by mouth daily as needed for erectile dysfunction.   Current Outpatient Medications  (Analgesics):    aspirin EC 81 MG tablet, Take 1 tablet (81 mg total) by mouth daily. Swallow whole.  Current Outpatient Medications (Hematological):    prasugrel (EFFIENT) 10 MG TABS tablet, Take 6 tablets by mouth 24 hours after last Brilinta dose. Then 24 hours later take 1 tablet (10mg ) by mouth daily. (Patient taking differently: Take 6 tablets by mouth 24)  Current Outpatient Medications (Other):    DULoxetine (CYMBALTA) 60 MG capsule, Take 1 capsule (60 mg total) by mouth daily.   gabapentin (NEURONTIN) 100 MG capsule, Take 2 capsules (200 mg total) by mouth 3 (three) times daily.   Vitamin D, Ergocalciferol, (DRISDOL) 1.25 MG (50000 UNIT) CAPS capsule, Take 1 capsule (50,000 Units total) by mouth every 7 (seven) days.   Reviewed prior external information including notes and imaging from  primary care provider As well as notes that were available from care everywhere and other healthcare systems.  Past medical history, social, surgical and family history all reviewed in electronic medical record.  No pertanent information unless stated regarding to the chief complaint.   Review of Systems:  No headache, visual changes, nausea, vomiting, diarrhea, constipation, dizziness, abdominal pain, skin rash, fevers, chills, night sweats, weight loss, swollen lymph nodes, body aches, joint swelling, chest pain, shortness of breath, mood changes. POSITIVE muscle aches  Objective  Blood pressure 110/74, pulse 68, height 5\' 11"  (1.803 m), weight 165 lb (74.8 kg), SpO2 96%.   General: No apparent distress alert and oriented x3 mood and affect normal, dressed appropriately.  HEENT: Pupils equal, extraocular movements intact  Respiratory: Patient's speak in full sentences and does not appear short of breath  Cardiovascular: No lower extremity edema, non tender, no erythema  Neck exam does have some mild loss of lordosis still noted.  Only 5 degrees of extension. Right shoulder exam shows  improvement in range of motion but still positive impingement noted.  Patient is also having difficulty with breakdown of the transverse arch of the foot.  Positive squeeze test noted with some numbness noted on the lateral aspect of the foot.  Negative straight leg test noted.  [id number]; 15 additional minutes spent for Therapeutic exercises as stated in above notes.  This included exercises focusing on stretching, strengthening, with significant focus on eccentric aspects.   Long term goals include an improvement in range of motion, strength, endurance as well as avoiding reinjury. Patient's frequency would include in 1-2 times a day, 3-5 times a week for a duration of 6-12 weeks. Exercises for the foot include:  Stretches to help lengthen the lower leg and plantar fascia areas Theraband exercises for the lower leg and ankle to help  strengthen the surrounding area- dorsiflexion, plantarflexion, inversion, eversion Massage rolling on the plantar surface of the foot with a frozen bottle, tennis ball or golf ball Towel or marble pick-ups to strengthen the plantar surface of the foot Weight bearing exercises to increase balance and overall stability   Proper technique shown and discussed handout in great detail with ATC.  All questions were discussed and answered.     Impression and Recommendations:    The above documentation has been reviewed and is accurate and complete Judi Saa, DO

## 2023-10-21 NOTE — Assessment & Plan Note (Signed)
Patient does have some neuroma noted of the foot I do think at this moment.  Discussed metatarsal pads for his shoes.  Discussed avoiding being barefoot.  Follow-up again in 6 to 8 weeks

## 2023-10-23 DIAGNOSIS — M7541 Impingement syndrome of right shoulder: Secondary | ICD-10-CM | POA: Diagnosis not present

## 2023-10-23 DIAGNOSIS — R293 Abnormal posture: Secondary | ICD-10-CM | POA: Diagnosis not present

## 2023-10-23 DIAGNOSIS — M5412 Radiculopathy, cervical region: Secondary | ICD-10-CM | POA: Diagnosis not present

## 2023-10-23 DIAGNOSIS — M25611 Stiffness of right shoulder, not elsewhere classified: Secondary | ICD-10-CM | POA: Diagnosis not present

## 2023-10-23 DIAGNOSIS — M25511 Pain in right shoulder: Secondary | ICD-10-CM | POA: Diagnosis not present

## 2023-10-28 DIAGNOSIS — R293 Abnormal posture: Secondary | ICD-10-CM | POA: Diagnosis not present

## 2023-10-28 DIAGNOSIS — M25611 Stiffness of right shoulder, not elsewhere classified: Secondary | ICD-10-CM | POA: Diagnosis not present

## 2023-10-28 DIAGNOSIS — M5412 Radiculopathy, cervical region: Secondary | ICD-10-CM | POA: Diagnosis not present

## 2023-10-28 DIAGNOSIS — M7541 Impingement syndrome of right shoulder: Secondary | ICD-10-CM | POA: Diagnosis not present

## 2023-10-28 DIAGNOSIS — M25511 Pain in right shoulder: Secondary | ICD-10-CM | POA: Diagnosis not present

## 2023-11-06 DIAGNOSIS — M5412 Radiculopathy, cervical region: Secondary | ICD-10-CM | POA: Diagnosis not present

## 2023-11-06 DIAGNOSIS — M25511 Pain in right shoulder: Secondary | ICD-10-CM | POA: Diagnosis not present

## 2023-11-06 DIAGNOSIS — R293 Abnormal posture: Secondary | ICD-10-CM | POA: Diagnosis not present

## 2023-11-06 DIAGNOSIS — M25611 Stiffness of right shoulder, not elsewhere classified: Secondary | ICD-10-CM | POA: Diagnosis not present

## 2023-11-06 DIAGNOSIS — M7541 Impingement syndrome of right shoulder: Secondary | ICD-10-CM | POA: Diagnosis not present

## 2023-11-17 DIAGNOSIS — M25511 Pain in right shoulder: Secondary | ICD-10-CM | POA: Diagnosis not present

## 2023-11-17 DIAGNOSIS — M25611 Stiffness of right shoulder, not elsewhere classified: Secondary | ICD-10-CM | POA: Diagnosis not present

## 2023-11-17 DIAGNOSIS — M7541 Impingement syndrome of right shoulder: Secondary | ICD-10-CM | POA: Diagnosis not present

## 2023-11-17 DIAGNOSIS — M5412 Radiculopathy, cervical region: Secondary | ICD-10-CM | POA: Diagnosis not present

## 2023-11-17 DIAGNOSIS — R293 Abnormal posture: Secondary | ICD-10-CM | POA: Diagnosis not present

## 2023-11-21 ENCOUNTER — Other Ambulatory Visit (HOSPITAL_BASED_OUTPATIENT_CLINIC_OR_DEPARTMENT_OTHER): Payer: Self-pay

## 2023-11-21 DIAGNOSIS — Z23 Encounter for immunization: Secondary | ICD-10-CM | POA: Diagnosis not present

## 2023-11-27 ENCOUNTER — Ambulatory Visit: Payer: Medicare Other | Admitting: Dermatology

## 2023-12-02 DIAGNOSIS — M5412 Radiculopathy, cervical region: Secondary | ICD-10-CM | POA: Diagnosis not present

## 2023-12-02 DIAGNOSIS — M25611 Stiffness of right shoulder, not elsewhere classified: Secondary | ICD-10-CM | POA: Diagnosis not present

## 2023-12-02 DIAGNOSIS — M7541 Impingement syndrome of right shoulder: Secondary | ICD-10-CM | POA: Diagnosis not present

## 2023-12-02 DIAGNOSIS — R293 Abnormal posture: Secondary | ICD-10-CM | POA: Diagnosis not present

## 2023-12-02 DIAGNOSIS — M25511 Pain in right shoulder: Secondary | ICD-10-CM | POA: Diagnosis not present

## 2023-12-05 ENCOUNTER — Encounter: Payer: Self-pay | Admitting: Family Medicine

## 2023-12-09 DIAGNOSIS — R293 Abnormal posture: Secondary | ICD-10-CM | POA: Diagnosis not present

## 2023-12-09 DIAGNOSIS — M5412 Radiculopathy, cervical region: Secondary | ICD-10-CM | POA: Diagnosis not present

## 2023-12-09 DIAGNOSIS — M25511 Pain in right shoulder: Secondary | ICD-10-CM | POA: Diagnosis not present

## 2023-12-09 DIAGNOSIS — M25611 Stiffness of right shoulder, not elsewhere classified: Secondary | ICD-10-CM | POA: Diagnosis not present

## 2023-12-09 DIAGNOSIS — M7541 Impingement syndrome of right shoulder: Secondary | ICD-10-CM | POA: Diagnosis not present

## 2023-12-10 ENCOUNTER — Ambulatory Visit: Payer: Medicare Other | Admitting: Family Medicine

## 2023-12-10 NOTE — Progress Notes (Signed)
[name] [address] [place] [address] Phone: [phone] Subjective:    I'm seeing this patient by the request  of:  Noberto Retort, MD  CC: Patient follow-up after fall  [id number]  10/21/2023 Patient does have some neuroma noted of the foot I do think at this moment. Discussed metatarsal pads for his shoes. Discussed avoiding being barefoot. Follow-up again in 6 to 8 weeks   Updated 12/12/2023 Paul [name] is a 73 y.o. male coming in with complaint of fell and would like OMT.  Patient did fall on the right side.  States that he extended his wrist.  Tripped over her dogs.  No head injury.  Patient has been able to be active but does have discomfort of this as well as the hip.      Past Medical History:  Diagnosis Date   Cataract    Depression    RESOLVED   Hyperlipidemia    Spinal stenosis    WAS HAVING NECK PAIN -PT STATES NERVE ABLATION PROCEDURE 2012 AND PAIN HAS RESOLVED   Spondylitis (HCC)    Past Surgical History:  Procedure Laterality Date   APPENDECTOMY  1962   CORONARY PRESSURE/FFR STUDY N/A 05/12/2023   Procedure: CORONARY PRESSURE/FFR STUDY;  Surgeon: Marykay Lex, MD;  Location: [facility] INVASIVE CV LAB;  Service: Cardiovascular;  Laterality: N/A;   CORONARY STENT INTERVENTION N/A 05/12/2023   Procedure: CORONARY STENT INTERVENTION;  Surgeon: Marykay Lex, MD;  Location: [facility] INVASIVE CV LAB;  Service: Cardiovascular;  Laterality: N/A;   HERNIA REPAIR     INSERTION OF MESH  10/06/2012   Procedure: INSERTION OF MESH;  Surgeon: [name]. Corliss Skains, MD;  Location: [facility];  Service: General;  Laterality: N/A;   KNEE SURGERY  1980 & 1982   left   LEFT HEART CATH AND CORONARY ANGIOGRAPHY N/A 05/12/2023   Procedure: LEFT HEART CATH AND CORONARY ANGIOGRAPHY;  Surgeon: Marykay Lex, MD;  Location: [facility] INVASIVE CV LAB;  Service: Cardiovascular;  Laterality: N/A;   TONSILLECTOMY  1968   UMBILICAL HERNIA REPAIR   10/06/2012   Procedure: HERNIA REPAIR UMBILICAL ADULT;  Surgeon: [name]. Corliss Skains, MD;  Location: [facility];  Service: General;  Laterality: N/A;   Social History   Socioeconomic History   Marital status: Married    Spouse name: Not on file   Number of children: Not on file   Years of education: Not on file   Highest education level: Not on file  Occupational History   Not on file  Tobacco Use   Smoking status: Never   Smokeless tobacco: Never  Vaping Use   Vaping status: Never Used  Substance and Sexual Activity   Alcohol use: Yes    Comment: OCCAS ALCOHOL   Drug use: No   Sexual activity: Not on file  Other Topics Concern   Not on file  Social History Narrative   ** Merged History Encounter **       Social Drivers of Health   Financial Resource Strain: Not on file  Food Insecurity: No Food Insecurity (05/10/2023)   Hunger Vital Sign    Worried About Running Out of Food in the Last Year: Never true    Ran Out of Food in the Last Year: Never true  Transportation Needs: No Transportation Needs (05/10/2023)   PRAPARE - Administrator, Civil Service (Medical): No    Lack of Transportation (Non-Medical): No  Physical Activity: Not  on file  Stress: Not on file  Social Connections: Not on file   Allergies  Allergen Reactions   Crestor [Rosuvastatin] Other (See Comments)    Myalgias  Pt currently taking 20mg  QD   Lipitor [Atorvastatin] Other (See Comments)    Liver toxicity   Family History  Problem Relation Age of Onset   Stroke Mother    Heart disease Father    Cancer Sister        bone     Current Outpatient Medications (Cardiovascular):    isosorbide mononitrate (IMDUR) 30 MG 24 hr tablet, Take 1 tablet (30 mg total) by mouth daily.   nitroGLYCERIN (NITROSTAT) 0.4 MG SL tablet, Place 1 tablet (0.4 mg total) under the tongue every 5 (five) minutes x 3 doses as needed for chest pain.   rosuvastatin (CRESTOR) 20 MG tablet, Take 1 tablet (20 mg total) by  mouth daily.   sildenafil (VIAGRA) 100 MG tablet, Take 100 mg by mouth daily as needed for erectile dysfunction.   Current Outpatient Medications (Analgesics):    aspirin EC 81 MG tablet, Take 1 tablet (81 mg total) by mouth daily. Swallow whole.  Current Outpatient Medications (Hematological):    prasugrel (EFFIENT) 10 MG TABS tablet, Take 6 tablets by mouth 24 hours after last Brilinta dose. Then 24 hours later take 1 tablet (10mg ) by mouth daily. (Patient taking differently: Take 6 tablets by mouth 24)  Current Outpatient Medications (Other):    DULoxetine (CYMBALTA) 60 MG capsule, Take 1 capsule (60 mg total) by mouth daily.   gabapentin (NEURONTIN) 100 MG capsule, Take 2 capsules (200 mg total) by mouth 3 (three) times daily.   Vitamin D, Ergocalciferol, (DRISDOL) 1.25 MG (50000 UNIT) CAPS capsule, Take 1 capsule (50,000 Units total) by mouth every 7 (seven) days.   Reviewed prior external information including notes and imaging from  primary care provider As well as notes that were available from care everywhere and other healthcare systems.  Past medical history, social, surgical and family history all reviewed in electronic medical record.  No pertanent information unless stated regarding to the chief complaint.   Review of Systems:  No headache, visual changes, nausea, vomiting, diarrhea, constipation, dizziness, abdominal pain, skin rash, fevers, chills, night sweats, weight loss, swollen lymph nodes, body aches, joint swelling, chest pain, shortness of breath, mood changes. POSITIVE muscle aches  Objective  Pulse (!) 56, height 5\' 11"  (1.803 m), weight 167 lb (75.8 kg), SpO2 96%.   General: No apparent distress alert and oriented x3 mood and affect normal, dressed appropriately.  HEENT: Pupils equal, extraocular movements intact  Respiratory: Patient's speak in full sentences and does not appear short of breath  Cardiovascular: No lower extremity edema, non tender, no  erythema  Right wrist exam shows a patient does have tenderness to palpation still noted.  Patient does have good range of motion though.  Good grip strength noted.   Right hip does have some bruising noted.  Good range of motion of the hip.  No groin pain.  Good internal and external range of motion.  Tender over the greater trochanteric area and gluteal area.  Limited muscular skeletal ultrasound was performed and interpreted by Antoine Primas, M  Limited ultrasound of patient's right wrist shows that there is hypoechoic changes in the third compartment of the dorsal wrist that is consistent with more of a tendinitis.  No true cortical irregularities noted or avulsion fractures noted.  The scaphoid lunate bone seems to be intact.  Impression and Recommendations:       The above documentation has been reviewed and is accurate and complete Judi Saa, DO Q

## 2023-12-11 DIAGNOSIS — R293 Abnormal posture: Secondary | ICD-10-CM | POA: Diagnosis not present

## 2023-12-11 DIAGNOSIS — M5412 Radiculopathy, cervical region: Secondary | ICD-10-CM | POA: Diagnosis not present

## 2023-12-11 DIAGNOSIS — M7541 Impingement syndrome of right shoulder: Secondary | ICD-10-CM | POA: Diagnosis not present

## 2023-12-11 DIAGNOSIS — M25511 Pain in right shoulder: Secondary | ICD-10-CM | POA: Diagnosis not present

## 2023-12-11 DIAGNOSIS — M25611 Stiffness of right shoulder, not elsewhere classified: Secondary | ICD-10-CM | POA: Diagnosis not present

## 2023-12-12 ENCOUNTER — Ambulatory Visit (INDEPENDENT_AMBULATORY_CARE_PROVIDER_SITE_OTHER): Payer: Medicare Other | Admitting: Family Medicine

## 2023-12-12 ENCOUNTER — Other Ambulatory Visit: Payer: Self-pay

## 2023-12-12 VITALS — HR 56 | Ht 71.0 in | Wt 167.0 lb

## 2023-12-12 DIAGNOSIS — M25531 Pain in right wrist: Secondary | ICD-10-CM | POA: Diagnosis not present

## 2023-12-12 DIAGNOSIS — S63501A Unspecified sprain of right wrist, initial encounter: Secondary | ICD-10-CM

## 2023-12-12 NOTE — Patient Instructions (Signed)
Coband for wrist Arnica for bruising Here for you See you again in 6 weeks

## 2023-12-12 NOTE — Progress Notes (Signed)
 [facility] - Clinic Note  12/24/2023     CHIEF COMPLAINT Patient presents for Retina Follow Up    HISTORY OF PRESENT ILLNESS: Paul [name] is a 73 y.o. male who presents to the clinic today for:   HPI     Retina Follow Up   In left eye.  This started 1 year ago.  Duration of 1 year.  Since onset it is stable.  I, the attending physician,  performed the HPI with the patient and updated documentation appropriately.        Comments   1 year retina follow up ERM OS pt is reporting no vision changes noticed he denies any flashes or floaters pt has broken blood vessel OS he was unaware of it seen during pupil exam       Last edited by Valdemar Rogue, MD on 12/24/2023  1:30 PM.    Pt states he had a heart attack in June last year, he is now on a blood thinner and aspirin   Referring physician: Helene Deacon, MD  HISTORICAL INFORMATION:   Selected notes from the MEDICAL RECORD NUMBER Referred by Dr. Deacon for retina eval and cataract clearance LEE:  Ocular Hx- PMH-    CURRENT MEDICATIONS: No current outpatient medications on file. (Ophthalmic Drugs)   No current facility-administered medications for this visit. (Ophthalmic Drugs)   Current Outpatient Medications (Other)  Medication Sig   aspirin  EC 81 MG tablet Take 1 tablet (81 mg total) by mouth daily. Swallow whole.   DULoxetine  (CYMBALTA ) 60 MG capsule Take 1 capsule (60 mg total) by mouth daily.   gabapentin  (NEURONTIN ) 100 MG capsule Take 2 capsules (200 mg total) by mouth 3 (three) times daily.   isosorbide  mononitrate (IMDUR ) 30 MG 24 hr tablet Take 1 tablet (30 mg total) by mouth daily.   mometasone  (ELOCON ) 0.1 % cream Apply 1 Application topically as directed. qd to bid aa left wrist until clear, then prn flares, avoid face, groin, axilla   nitroGLYCERIN  (NITROSTAT ) 0.4 MG SL tablet Place 1 tablet (0.4 mg total) under the tongue every 5 (five) minutes x 3 doses as needed for chest  pain.   prasugrel  (EFFIENT ) 10 MG TABS tablet Take 6 tablets by mouth 24 hours after last Brilinta  dose. Then 24 hours later take 1 tablet (10mg ) by mouth daily. (Patient taking differently: Take 6 tablets by mouth 24)   rosuvastatin  (CRESTOR ) 20 MG tablet Take 1 tablet (20 mg total) by mouth daily.   sildenafil (VIAGRA) 100 MG tablet Take 100 mg by mouth daily as needed for erectile dysfunction.   Vitamin D , Ergocalciferol , (DRISDOL ) 1.25 MG (50000 UNIT) CAPS capsule Take 1 capsule (50,000 Units total) by mouth every 7 (seven) days.   No current facility-administered medications for this visit. (Other)   REVIEW OF SYSTEMS: ROS   Positive for: Eyes Negative for: Constitutional, Gastrointestinal, Neurological, Skin, Genitourinary, Musculoskeletal, HENT, Endocrine, Cardiovascular, Respiratory, Psychiatric, Allergic/Imm, Heme/Lymph Last edited by Resa Delon ORN, COT on 12/24/2023  8:39 AM.      ALLERGIES Allergies  Allergen Reactions   Crestor  [Rosuvastatin ] Other (See Comments)    Myalgias  Pt currently taking 20mg  QD   Lipitor [Atorvastatin] Other (See Comments)    Liver toxicity   PAST MEDICAL HISTORY Past Medical History:  Diagnosis Date   Actinic keratosis    Cataract    Depression    RESOLVED   Hyperlipidemia    Spinal stenosis    WAS HAVING NECK PAIN -  PT STATES NERVE ABLATION PROCEDURE 2012 AND PAIN HAS RESOLVED   Spondylitis (HCC)    Past Surgical History:  Procedure Laterality Date   APPENDECTOMY  1962   CORONARY PRESSURE/FFR STUDY N/A 05/12/2023   Procedure: CORONARY PRESSURE/FFR STUDY;  Surgeon: Anner Alm ORN, MD;  Location: [facility] INVASIVE CV LAB;  Service: Cardiovascular;  Laterality: N/A;   CORONARY STENT INTERVENTION N/A 05/12/2023   Procedure: CORONARY STENT INTERVENTION;  Surgeon: Anner Alm ORN, MD;  Location: [facility] INVASIVE CV LAB;  Service: Cardiovascular;  Laterality: N/A;   HERNIA REPAIR     INSERTION OF MESH  10/06/2012   Procedure: INSERTION OF  MESH;  Surgeon: Donnice POUR. Belinda, MD;  Location: [facility];  Service: General;  Laterality: N/A;   KNEE SURGERY  1980 & 1982   left   LEFT HEART CATH AND CORONARY ANGIOGRAPHY N/A 05/12/2023   Procedure: LEFT HEART CATH AND CORONARY ANGIOGRAPHY;  Surgeon: Anner Alm ORN, MD;  Location: [facility] INVASIVE CV LAB;  Service: Cardiovascular;  Laterality: N/A;   TONSILLECTOMY  1968   UMBILICAL HERNIA REPAIR  10/06/2012   Procedure: HERNIA REPAIR UMBILICAL ADULT;  Surgeon: Donnice POUR. Tsuei, MD;  Location: [facility];  Service: General;  Laterality: N/A;   FAMILY HISTORY Family History  Problem Relation Age of Onset   Stroke Mother    Heart disease Father    Cancer Sister        bone   SOCIAL HISTORY Social History   Tobacco Use   Smoking status: Never   Smokeless tobacco: Never  Vaping Use   Vaping status: Never Used  Substance Use Topics   Alcohol use: Yes    Comment: OCCAS ALCOHOL   Drug use: No       OPHTHALMIC EXAM:  Base Eye Exam     Visual Acuity (Snellen - Linear)       Right Left   Dist cc 20/20 20/20         Tonometry (Tonopen, 8:43 AM)       Right Left   Pressure 14 15         Pupils       Pupils Dark Light Shape React APD   Right PERRL 4 3 Round Brisk None   Left PERRL 4 3 Round Brisk None         Visual Fields       Left Right    Full Full         Extraocular Movement       Right Left    Full, Ortho Full, Ortho         Neuro/Psych     Oriented x3: Yes   Mood/Affect: Normal         Dilation     Both eyes: 2.5% Phenylephrine @ 8:43 AM           Slit Lamp and Fundus Exam     Slit Lamp Exam       Right Left   Lids/Lashes Dermatochalasis - upper lid Dermatochalasis - upper lid   Conjunctiva/Sclera White and quiet Subconjunctival hemorrhage   Cornea mild arcus, well healed cataract wound, tear film debris mild arcus, well healed cataract wound, trace tear film debris   Anterior Chamber deep, clear, narrow temporal angle deep,  clear, narrow temporal angle   Iris Round and dilated Round and dilated   Lens Vivity PC IOL in good position Vivity PC IOL in good position   Anterior Vitreous Vitreous syneresis Vitreous syneresis  Fundus Exam       Right Left   Disc Pink and Sharp, Compact, focal PPP Pink and Sharp, mild tilt, Compact   C/D Ratio 0.4 0.3   Macula Flat, Good foveal reflex, mild RPE mottling, No heme or edema Flat, blunted foveal reflex, mild ERM with mild striae, No heme or edema   Vessels attenuated, Tortuous attenuated, copper wiring, Tortuous, mild AV crossing changes   Periphery Attached, No heme, No RT/RD Attached, mild pigmented cystoid degeneration, No heme, No RT/RD, flat choroidal nevus SN to disc (1.25DD), mild peripheral drusen           Refraction     Wearing Rx       Sphere Cylinder Axis Add   Right +1.00 +1.25 168 +2.75   Left +0.50 +0.75 178 +2.75    Type: PAL           IMAGING AND PROCEDURES  Imaging and Procedures for 12/24/2023  OCT, Retina - OU - Both Eyes       Right Eye Quality was good. Central Foveal Thickness: 276. Progression has been stable. Findings include normal foveal contour, no IRF, no SRF (Trace ERM).   Left Eye Quality was good. Central Foveal Thickness: 378. Progression has been stable. Findings include no IRF, no SRF, abnormal foveal contour, epiretinal membrane (Mild ERM with blunted foveal contour -- mild progression from prior).   Notes *Images captured and stored on drive  Diagnosis / Impression:  OD: NFP, no IRF/SRF OS: Mild ERM with blunted foveal contour -- mild progression from prior; no IRF/SRF  Clinical management:  See below  Abbreviations: NFP - Normal foveal profile. CME - cystoid macular edema. PED - pigment epithelial detachment. IRF - intraretinal fluid. SRF - subretinal fluid. EZ - ellipsoid zone. ERM - epiretinal membrane. ORA - outer retinal atrophy. ORT - outer retinal tubulation. SRHM - subretinal  hyper-reflective material. IRHM - intraretinal hyper-reflective material             ASSESSMENT/PLAN:    ICD-10-CM   1. Epiretinal membrane (ERM) of left eye  H35.372 OCT, Retina - OU - Both Eyes    2. 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 Combined forms of age-related cataract of both eyes  H25.813      1. Epiretinal membrane, left eye - mild ERM -- stable - BCVA 20/20 OU - asymptomatic, no metamorphopsia - OCT with no significant change in ERM  - no indication for surgery at this time - monitor - f/u 1 yr -- DFE/OCT -- color / FAF optos pics  2. Choroidal Nevus, OS  - 1.25 DD pigmented lesion SN to disc  - no visual symptoms, SRF or orange pigment  - no drusen  - flat, thickness < 2mm  - discussed findings, prognosis  - monitor  3. Pseudophakia OU  - s/p CE/IOL OU (Vivity lenses OU, Dr. Meridee, Nov. '22)  - IOLs in perfect position, doing well  - monitor  Ophthalmic Meds Ordered this visit:  No orders of the defined types were placed in this encounter.    Return in about 1 year (around 12/23/2024) for f/u ERM / nevus OS, DFE, OCT, OPTOS colors.  There are no Patient Instructions on file for this visit.   Explained the diagnoses, plan, and follow up with the patient and they expressed understanding.  Patient expressed understanding of the importance of proper follow up care.   This document serves as a record of  services personally performed by Redell JUDITHANN Hans, MD, PhD. It was created on their behalf by Alan PARAS. Delores, OA an ophthalmic technician. The creation of this record is the provider's dictation and/or activities during the visit.    Electronically signed by: Alan PARAS. Delores, OA 12/24/23 1:32 PM  Redell JUDITHANN Hans, M.D., Ph.D. Diseases & Surgery of the Retina and Vitreous [facility]  I have reviewed the above documentation for accuracy and completeness, and I agree with the above. Redell JUDITHANN Hans, M.D., Ph.D. 12/24/23  1:34 PM   Abbreviations: M myopia (nearsighted); A astigmatism; H hyperopia (farsighted); P presbyopia; Mrx spectacle prescription;  CTL contact lenses; OD right eye; OS left eye; OU both eyes  XT exotropia; ET esotropia; PEK punctate epithelial keratitis; PEE punctate epithelial erosions; DES dry eye syndrome; MGD meibomian gland dysfunction; ATs artificial tears; PFAT's preservative free artificial tears; NSC nuclear sclerotic cataract; PSC posterior subcapsular cataract; ERM epi-retinal membrane; PVD posterior vitreous detachment; RD retinal detachment; DM diabetes mellitus; DR diabetic retinopathy; NPDR non-proliferative diabetic retinopathy; PDR proliferative diabetic retinopathy; CSME clinically significant macular edema; DME diabetic macular edema; dbh dot blot hemorrhages; CWS cotton wool spot; POAG primary open angle glaucoma; C/D cup-to-disc ratio; HVF humphrey visual field; GVF goldmann visual field; OCT optical coherence tomography; IOP intraocular pressure; BRVO Branch retinal vein occlusion; CRVO central retinal vein occlusion; CRAO central retinal artery occlusion; BRAO branch retinal artery occlusion; RT retinal tear; SB scleral buckle; PPV pars plana vitrectomy; VH Vitreous hemorrhage; PRP panretinal laser photocoagulation; IVK intravitreal kenalog ; VMT vitreomacular traction; MH Macular hole;  NVD neovascularization of the disc; NVE neovascularization elsewhere; AREDS age related eye disease study; ARMD age related macular degeneration; POAG primary open angle glaucoma; EBMD epithelial/anterior basement membrane dystrophy; ACIOL anterior chamber intraocular lens; IOL intraocular lens; PCIOL posterior chamber intraocular lens; Phaco/IOL phacoemulsification with intraocular lens placement; PRK photorefractive keratectomy; LASIK laser assisted in situ keratomileusis; HTN hypertension; DM diabetes mellitus; COPD chronic obstructive pulmonary disease

## 2023-12-13 ENCOUNTER — Encounter: Payer: Self-pay | Admitting: Family Medicine

## 2023-12-13 DIAGNOSIS — S63501A Unspecified sprain of right wrist, initial encounter: Secondary | ICD-10-CM | POA: Insufficient documentation

## 2023-12-13 NOTE — Assessment & Plan Note (Signed)
Patient previously did have a TFCC injury long ago.  May be some mild exacerbation but seems to be more of a tendinitis and sprain.  Discussed with patient about icing regimen and home exercises otherwise.  Discussed increasing activity slowly.  Discussed icing regimen.  Brace in order taping and follow-up again in 6 to 8 weeks

## 2023-12-17 ENCOUNTER — Ambulatory Visit: Payer: Medicare Other | Admitting: Dermatology

## 2023-12-17 ENCOUNTER — Encounter: Payer: Self-pay | Admitting: Dermatology

## 2023-12-17 DIAGNOSIS — L821 Other seborrheic keratosis: Secondary | ICD-10-CM | POA: Diagnosis not present

## 2023-12-17 DIAGNOSIS — L2489 Irritant contact dermatitis due to other agents: Secondary | ICD-10-CM | POA: Diagnosis not present

## 2023-12-17 DIAGNOSIS — D692 Other nonthrombocytopenic purpura: Secondary | ICD-10-CM | POA: Diagnosis not present

## 2023-12-17 DIAGNOSIS — L814 Other melanin hyperpigmentation: Secondary | ICD-10-CM | POA: Diagnosis not present

## 2023-12-17 DIAGNOSIS — L578 Other skin changes due to chronic exposure to nonionizing radiation: Secondary | ICD-10-CM | POA: Diagnosis not present

## 2023-12-17 DIAGNOSIS — W908XXA Exposure to other nonionizing radiation, initial encounter: Secondary | ICD-10-CM

## 2023-12-17 DIAGNOSIS — D1801 Hemangioma of skin and subcutaneous tissue: Secondary | ICD-10-CM | POA: Diagnosis not present

## 2023-12-17 DIAGNOSIS — L570 Actinic keratosis: Secondary | ICD-10-CM | POA: Diagnosis not present

## 2023-12-17 DIAGNOSIS — Z1283 Encounter for screening for malignant neoplasm of skin: Secondary | ICD-10-CM | POA: Diagnosis not present

## 2023-12-17 DIAGNOSIS — Z808 Family history of malignant neoplasm of other organs or systems: Secondary | ICD-10-CM | POA: Diagnosis not present

## 2023-12-17 DIAGNOSIS — D229 Melanocytic nevi, unspecified: Secondary | ICD-10-CM

## 2023-12-17 DIAGNOSIS — L820 Inflamed seborrheic keratosis: Secondary | ICD-10-CM

## 2023-12-17 MED ORDER — MOMETASONE FUROATE 0.1 % EX CREA: 1.0000 | TOPICAL_CREAM | CUTANEOUS | 1 refills | Status: DC

## 2023-12-17 NOTE — Progress Notes (Signed)
New Patient Visit   Subjective  Paul [name] is a 73 y.o. male who presents for the following: Skin Cancer Screening and Full Body Skin Exam, no hx of skin cancer, Sister with hx of skin cancer not sure type  New patient referral from Antoine Primas, DO.  The patient presents for Total-Body Skin Exam (TBSE) for skin cancer screening and mole check. The patient has spots, moles and lesions to be evaluated, some may be new or changing and the patient may have concern these could be cancer.    The following portions of the chart were reviewed this encounter and updated as appropriate: medications, allergies, medical history  Review of Systems:  No other skin or systemic complaints except as noted in HPI or Assessment and Plan.  Objective  Well appearing patient in no apparent distress; mood and affect are within normal limits.  A full examination was performed including scalp, head, eyes, ears, nose, lips, neck, chest, axillae, abdomen, back, buttocks, bilateral upper extremities, bilateral lower extremities, hands, feet, fingers, toes, fingernails, and toenails. All findings within normal limits unless otherwise noted below.   Relevant physical exam findings are noted in the Assessment and Plan.  R forehead x 1, L cheek x 2, L ear helix x 1, nasal dorsum x 1, R ear helix x 1 (6) Pink scaly macules L post shoulder x 1 Stuck on waxy papule with erythema  Assessment & Plan   SKIN CANCER SCREENING PERFORMED TODAY.  ACTINIC DAMAGE - Chronic condition, secondary to cumulative UV/sun exposure - diffuse scaly erythematous macules with underlying dyspigmentation - Recommend daily broad spectrum sunscreen SPF 30+ to sun-exposed areas, reapply every 2 hours as needed.  - Staying in the shade or wearing long sleeves, sun glasses (UVA+UVB protection) and wide brim hats (4-inch brim around the entire circumference of the hat) are also recommended for sun protection.  - Call for new or  changing lesions.  LENTIGINES, SEBORRHEIC KERATOSES, HEMANGIOMAS - Benign normal skin lesions - Benign-appearing - Call for any changes  MELANOCYTIC NEVI - Tan-brown and/or pink-flesh-colored symmetric macules and papules - Benign appearing on exam today - Observation - Call clinic for new or changing moles - Recommend daily use of broad spectrum spf 30+ sunscreen to sun-exposed areas.   FAMILY HISTORY OF SKIN CANCER What type(s):not sure type Who affected:sister   Purpura - Chronic; persistent and recurrent.  Treatable, but not curable. arms - Violaceous macules and patches - Benign - Related to trauma, age, sun damage and/or use of blood thinners, chronic use of topical and/or oral steroids - Observe - Can use OTC arnica containing moisturizer such as Dermend Bruise Formula if desired - Call for worsening or other concerns  IRRITANT CONTACT DERMATITIS Secondary to watch L dorsal wrist Exam: Scaly pink patch under watchband  Treatment Plan: Start Mometasone cr qd/bid until clear, then prn flares, avoid f/g/a  Dry skin thoroughly after hand washing  Topical steroids (such as triamcinolone, fluocinolone, fluocinonide, mometasone, clobetasol, halobetasol, betamethasone, hydrocortisone) can cause thinning and lightening of the skin if they are used for too long in the same area. Your physician has selected the right strength medicine for your problem and area affected on the body. Please use your medication only as directed by your physician to prevent side effects.   AK (ACTINIC KERATOSIS) (6) R forehead x 1, L cheek x 2, L ear helix x 1, nasal dorsum x 1, R ear helix x 1 (6) Actinic keratoses are precancerous spots that  appear secondary to cumulative UV radiation exposure/sun exposure over time. They are chronic with expected duration over 1 year. A portion of actinic keratoses will progress to squamous cell carcinoma of the skin. It is not possible to reliably predict which  spots will progress to skin cancer and so treatment is recommended to prevent development of skin cancer.  Recommend daily broad spectrum sunscreen SPF 30+ to sun-exposed areas, reapply every 2 hours as needed.  Recommend staying in the shade or wearing long sleeves, sun glasses (UVA+UVB protection) and wide brim hats (4-inch brim around the entire circumference of the hat). Call for new or changing lesions. Destruction of lesion - R forehead x 1, L cheek x 2, L ear helix x 1, nasal dorsum x 1, R ear helix x 1 (6)  Destruction method: cryotherapy   Informed consent: discussed and consent obtained   Lesion destroyed using liquid nitrogen: Yes   Region frozen until ice ball extended beyond lesion: Yes   Outcome: patient tolerated procedure well with no complications   Post-procedure details: wound care instructions given   Additional details:  Prior to procedure, discussed risks of blister formation, small wound, skin dyspigmentation, or rare scar following cryotherapy. Recommend Vaseline ointment to treated areas while healing.  INFLAMED SEBORRHEIC KERATOSIS L post shoulder x 1 Symptomatic, irritating, patient would like treated. Destruction of lesion - L post shoulder x 1  Destruction method: cryotherapy   Informed consent: discussed and consent obtained   Lesion destroyed using liquid nitrogen: Yes   Region frozen until ice ball extended beyond lesion: Yes   Outcome: patient tolerated procedure well with no complications   Post-procedure details: wound care instructions given   Additional details:  Prior to procedure, discussed risks of blister formation, small wound, skin dyspigmentation, or rare scar following cryotherapy. Recommend Vaseline ointment to treated areas while healing.   Return in about 6 months (around 06/15/2024) for AK f/u.  I, Ardis Rowan, RMA, am acting as scribe for Willeen Niece, MD .   Documentation: I have reviewed the above documentation for accuracy and  completeness, and I agree with the above.  Willeen Niece, MD

## 2023-12-17 NOTE — Patient Instructions (Addendum)
Actinic Keratosis  What is an actinic keratosis? An actinic keratosis (plural: actinic keratoses) is growth on the surface of the skin that usually appears as a red, hard, crusty or scaly bump.   What causes actinic keratoses? Repeated prolonged sun exposure causes skin damage, especially in fair-skinned persons. Sun-damaged skin becomes dry and wrinkled and may form rough, scaly spots called actinic keratoses. These rough spots remain on the skin even though the crust or scale on top is picked off.   Why treat actinic keratoses? Actinic keratoses are not skin cancers, but because they may sometimes turn cancerous they are called "pre-cancerous". Not all will turn to skin cancer, and it usually takes several years for this to happen. Because it is much easier to treat an actinic keratosis then it is to remove a skin cancer, actinic keratoses should be treated to prevent future skin cancer.   How are actinic keratoses treated? The most common way of treating actinic keratoses is to freeze them with liquid nitrogen. Freezing causes scabbing and shedding of the sun-damaged skin. Healing after a removal usually takes two weeks, depending on the size and location of the keratosis. Hands and legs heal more slowly than the face. The skin's final appearance is usually excellent. There are several topical medications that can be used to treat actinic keratoses. These medications generally have side effects of redness, crusting, and pain. Some are used for a few days, and some for several months before the actinic keratosis is completely gone. Photodynamic therapy is another alternative to freezing actinic keratoses. This treatment is done in a physician's office. A medication is applied to the area of skin with actinic keratoses, and it is allowed to soak in for one or more hours. A special light is then applied to the skin. Side effects include redness, burning, and peeling.  How can you prevent actinic  keratoses? Protection from the sun is the best way to prevent actinic keratoses. The use of proper clothing and sunscreens can prevent the sun damage that leads to an actinic keratosis.  Unfortunately, some sun damage is permanent. Once sun damage has progressed to the point where actinic keratoses develop, new keratoses may appear even without further sun exposure. However, even in skin that is already heavily sun damaged, good sun protection can help reduce the number of actinic keratoses that will appear.    Cryotherapy Aftercare  Wash gently with soap and water everyday.   Apply Vaseline and Band-Aid daily until healed.    Seborrheic Keratosis  What causes seborrheic keratoses? Seborrheic keratoses are harmless, common skin growths that first appear during adult life.  As time goes by, more growths appear.  Some people may develop a large number of them.  Seborrheic keratoses appear on both covered and uncovered body parts.  They are not caused by sunlight.  The tendency to develop seborrheic keratoses can be inherited.  They vary in color from skin-colored to gray, brown, or even black.  They can be either smooth or have a rough, warty surface.   Seborrheic keratoses are superficial and look as if they were stuck on the skin.  Under the microscope this type of keratosis looks like layers upon layers of skin.  That is why at times the top layer may seem to fall off, but the rest of the growth remains and re-grows.    Treatment Seborrheic keratoses do not need to be treated, but can easily be removed in the office.  Seborrheic keratoses often cause  symptoms when they rub on clothing or jewelry.  Lesions can be in the way of shaving.  If they become inflamed, they can cause itching, soreness, or burning.  Removal of a seborrheic keratosis can be accomplished by freezing, burning, or surgery. If any spot bleeds, scabs, or grows rapidly, please return to have it checked, as these can be an  indication of a skin cancer.  Irritant Contact Dermatitis Left wrist  Start Mometasone cream one to two times a day until clear, then as needed for flares, avoid face, groin, axilla  Dry Skin Care  What causes dry skin?  Dry skin is common and results from inadequate moisture in the outer skin layers. Dry skin usually results from the excessive loss of moisture from the skin surface. This occurs due to two major factors: Normally the skin's oil glands deposit a layer of oil on the skin's surface. This layer of oil prevents the loss of moisture from the skin. Exposure to soaps, cleaners, solvents, and disinfectants removes this oily film, allowing water to escape. Water loss from the skin increases when the humidity is low. During winter months we spend a lot of time indoors where the air is heated. Heated air has very low humidity. This also contributes to dry skin.  A tendency for dry skin may accompany such disorders as eczema. Also, as people age, the number of functioning oil glands decreases, and the tendency toward dry skin can be a sensation of skin tightness when emerging from the shower.  How do I manage dry skin?  Humidify your environment. This can be accomplished by using a humidifier in your bedroom at night during winter months. Bathing can actually put moisture back into your skin if done right. Take the following steps while bathing to sooth dry skin: Avoid hot water, which only dries the skin and makes itching worse. Use warm water. Avoid washcloths or extensive rubbing or scrubbing. Use mild soaps like unscented Dove, Oil of Olay, Cetaphil, Basis, or CeraVe. If you take baths rather than showers, rinse off soap residue with clean water before getting out of tub. Once out of the shower/tub, pat dry gently with a soft towel. Leave your skin damp. While still damp, apply any medicated ointment/cream you were prescribed to the affected areas. After you apply your medicated  ointment/cream, then apply your moisturizer to your whole body.This is the most important step in dry skin care. If this is omitted, your skin will continue to be dry. The choice of moisturizer is also very important. In general, lotion will not provider enough moisture to severely dry skin because it is water based. You should use an ointment or cream. Moisturizers should also be unscented. Good choices include Vaseline (plain petrolatum), Aquaphor, Cetaphil, CeraVe, Vanicream, DML Forte, Aveeno moisture, or Eucerin Cream. Bath oils can be helpful, but do not replace the application of moisturizer after the bath. In addition, they make the tub slippery causing an increased risk for falls. Therefore, we do not recommend their use.   Due to recent changes in healthcare laws, you may see results of your pathology and/or laboratory studies on MyChart before the doctors have had a chance to review them. We understand that in some cases there may be results that are confusing or concerning to you. Please understand that not all results are received at the same time and often the doctors may need to interpret multiple results in order to provide you with the best plan of care or  course of treatment. Therefore, we ask that you please give [place] 2 business days to thoroughly review all your results before contacting the office for clarification. Should we see a critical lab result, you will be contacted sooner.   If You Need Anything After Your Visit  If you have any questions or concerns for your doctor, please call our main line at [phone] and press option 4 to reach your doctor's medical assistant. If no one answers, please leave a voicemail as directed and we will return your call as soon as possible. Messages left after 4 pm will be answered the following business day.   You may also send [place] a message via MyChart. We typically respond to MyChart messages within 1-2 business days.  For prescription  refills, please ask your pharmacy to contact our office. Our fax number is [phone].  If you have an urgent issue when the clinic is closed that cannot wait until the next business day, you can page your doctor at the number below.    Please note that while we do our best to be available for urgent issues outside of office hours, we are not available 24/7.   If you have an urgent issue and are unable to reach [place], you may choose to seek medical care at your doctor's office, retail clinic, urgent care center, or emergency room.  If you have a medical emergency, please immediately call 911 or go to the emergency department.  Pager Numbers  - Dr. Gwen Pounds: [phone]  - Dr. Roseanne Reno: [phone]  - Dr. Katrinka Blazing: [phone]   In the event of inclement weather, please call our main line at [phone] for an update on the status of any delays or closures.  Dermatology Medication Tips: Please keep the boxes that topical medications come in in order to help keep track of the instructions about where and how to use these. Pharmacies typically print the medication instructions only on the boxes and not directly on the medication tubes.   If your medication is too expensive, please contact our office at [phone] option 4 or send [place] a message through MyChart.   We are unable to tell what your co-pay for medications will be in advance as this is different depending on your insurance coverage. However, we may be able to find a substitute medication at lower cost or fill out paperwork to get insurance to cover a needed medication.   If a prior authorization is required to get your medication covered by your insurance company, please allow [place] 1-2 business days to complete this process.  Drug prices often vary depending on where the prescription is filled and some pharmacies may offer cheaper prices.  The website www.goodrx.com contains coupons for medications through different pharmacies.  The prices here do not account for what the cost may be with help from insurance (it may be cheaper with your insurance), but the website can give you the price if you did not use any insurance.  - You can print the associated coupon and take it with your prescription to the pharmacy.  - You may also stop by our office during regular business hours and pick up a GoodRx coupon card.  - If you need your prescription sent electronically to a different pharmacy, notify our office through [facility] or by phone at [phone] option 4.     Si Usted Necesita Algo Despus de Su Visita  Tambin puede enviarnos un mensaje a travs de Clinical cytogeneticist. Por lo general respondemos  a los mensajes de MyChart en el transcurso de 1 a 2 das hbiles.  Para renovar recetas, por favor pida a su farmacia que se ponga en contacto con nuestra oficina. Annie Sable de fax es [place] [phone].  Si tiene un asunto urgente cuando la clnica est cerrada y que no puede esperar hasta el siguiente da hbil, puede llamar/localizar a su doctor(a) al nmero que aparece a continuacin.   Por favor, tenga en cuenta que aunque hacemos todo lo posible para estar disponibles para asuntos urgentes fuera del horario de [place], no estamos disponibles las 24 horas del da, los 7 [address]  [address] de la [place].   Si tiene un problema urgente y no puede comunicarse con nosotros, puede optar por buscar atencin mdica  en el consultorio de su doctor(a), en una clnica privada, en un centro de atencin urgente o en una sala de emergencias.  Si tiene Engineer, drilling, por favor llame inmediatamente al 911 o vaya a la sala de emergencias.  Nmeros de bper  - Dr. Gwen Pounds: [phone]  - Dra. Roseanne Reno: [phone]  - Dr. Katrinka Blazing: [phone]   En caso de inclemencias del tiempo, por favor llame a Lacy Duverney principal al [phone] para una actualizacin sobre el [place] de cualquier retraso o cierre.  Consejos para la medicacin  en dermatologa: Por favor, guarde las cajas en las que vienen los medicamentos de uso tpico para ayudarle a seguir las instrucciones sobre dnde y cmo usarlos. Las farmacias generalmente imprimen las instrucciones del medicamento slo en las cajas y no directamente en los tubos del [place].   Si su medicamento es muy caro, por favor, pngase en contacto con Rolm Gala llamando al [phone] y presione la opcin 4 o envenos un mensaje a travs de Clinical cytogeneticist.   No podemos decirle cul ser su copago por los medicamentos por adelantado ya que esto es diferente dependiendo de la cobertura de su seguro. Sin embargo, es posible que podamos encontrar un medicamento sustituto a Audiological scientist un formulario para que el seguro cubra el medicamento que se considera necesario.   Si se requiere una autorizacin previa para que su compaa de seguros [place] su medicamento, por favor permtanos de 1 a 2 das hbiles para completar [address]  Los precios de los medicamentos varan con frecuencia dependiendo del Environmental consultant de dnde se surte la receta y alguna farmacias pueden ofrecer precios ms baratos.  El sitio web www.goodrx.com tiene cupones para medicamentos de Health and safety inspector. Los precios aqu no tienen en cuenta lo que podra costar con la ayuda del seguro (puede ser ms barato con su seguro), pero el sitio web puede darle el precio si no utiliz Tourist information centre manager.  - Puede imprimir el cupn correspondiente y llevarlo con su receta a la farmacia.  - Tambin puede pasar por nuestra oficina durante el horario de atencin regular y Education officer, museum una tarjeta de cupones de GoodRx.  - Si necesita que su receta se enve electrnicamente a una farmacia diferente, informe a nuestra oficina a travs de MyChart de [place] o por telfono llamando al [phone] y presione la opcin 4.

## 2023-12-18 ENCOUNTER — Other Ambulatory Visit: Payer: Self-pay | Admitting: Family Medicine

## 2023-12-18 MED ORDER — VITAMIN D (ERGOCALCIFEROL) 1.25 MG (50000 UNIT) PO CAPS: 50000.0000 [IU] | ORAL_CAPSULE | ORAL | 0 refills | Status: DC

## 2023-12-18 MED ORDER — GABAPENTIN 100 MG PO CAPS: 200.0000 mg | ORAL_CAPSULE | Freq: Three times a day (TID) | ORAL | 0 refills | Status: DC

## 2023-12-23 DIAGNOSIS — S86819S Strain of other muscle(s) and tendon(s) at lower leg level, unspecified leg, sequela: Secondary | ICD-10-CM | POA: Diagnosis not present

## 2023-12-23 DIAGNOSIS — S86819D Strain of other muscle(s) and tendon(s) at lower leg level, unspecified leg, subsequent encounter: Secondary | ICD-10-CM | POA: Diagnosis not present

## 2023-12-24 ENCOUNTER — Encounter (INDEPENDENT_AMBULATORY_CARE_PROVIDER_SITE_OTHER): Payer: Self-pay | Admitting: Ophthalmology

## 2023-12-24 ENCOUNTER — Ambulatory Visit (INDEPENDENT_AMBULATORY_CARE_PROVIDER_SITE_OTHER): Payer: Medicare Other | Admitting: Ophthalmology

## 2023-12-24 DIAGNOSIS — H35372 Puckering of macula, left eye: Secondary | ICD-10-CM

## 2023-12-24 DIAGNOSIS — D3132 Benign neoplasm of left choroid: Secondary | ICD-10-CM

## 2023-12-24 DIAGNOSIS — H25813 Combined forms of age-related cataract, bilateral: Secondary | ICD-10-CM

## 2023-12-25 DIAGNOSIS — S86819S Strain of other muscle(s) and tendon(s) at lower leg level, unspecified leg, sequela: Secondary | ICD-10-CM | POA: Diagnosis not present

## 2023-12-25 DIAGNOSIS — S86819D Strain of other muscle(s) and tendon(s) at lower leg level, unspecified leg, subsequent encounter: Secondary | ICD-10-CM | POA: Diagnosis not present

## 2023-12-30 ENCOUNTER — Ambulatory Visit: Payer: Medicare Other | Admitting: Family Medicine

## 2023-12-30 DIAGNOSIS — S86819S Strain of other muscle(s) and tendon(s) at lower leg level, unspecified leg, sequela: Secondary | ICD-10-CM | POA: Diagnosis not present

## 2023-12-30 DIAGNOSIS — S86819D Strain of other muscle(s) and tendon(s) at lower leg level, unspecified leg, subsequent encounter: Secondary | ICD-10-CM | POA: Diagnosis not present

## 2024-01-01 DIAGNOSIS — S86819S Strain of other muscle(s) and tendon(s) at lower leg level, unspecified leg, sequela: Secondary | ICD-10-CM | POA: Diagnosis not present

## 2024-01-01 DIAGNOSIS — S86819D Strain of other muscle(s) and tendon(s) at lower leg level, unspecified leg, subsequent encounter: Secondary | ICD-10-CM | POA: Diagnosis not present

## 2024-01-06 DIAGNOSIS — S86819D Strain of other muscle(s) and tendon(s) at lower leg level, unspecified leg, subsequent encounter: Secondary | ICD-10-CM | POA: Diagnosis not present

## 2024-01-06 DIAGNOSIS — S86819S Strain of other muscle(s) and tendon(s) at lower leg level, unspecified leg, sequela: Secondary | ICD-10-CM | POA: Diagnosis not present

## 2024-01-08 DIAGNOSIS — S86819D Strain of other muscle(s) and tendon(s) at lower leg level, unspecified leg, subsequent encounter: Secondary | ICD-10-CM | POA: Diagnosis not present

## 2024-01-08 DIAGNOSIS — S86819S Strain of other muscle(s) and tendon(s) at lower leg level, unspecified leg, sequela: Secondary | ICD-10-CM | POA: Diagnosis not present

## 2024-01-13 DIAGNOSIS — S86819D Strain of other muscle(s) and tendon(s) at lower leg level, unspecified leg, subsequent encounter: Secondary | ICD-10-CM | POA: Diagnosis not present

## 2024-01-13 DIAGNOSIS — S86819S Strain of other muscle(s) and tendon(s) at lower leg level, unspecified leg, sequela: Secondary | ICD-10-CM | POA: Diagnosis not present

## 2024-01-15 DIAGNOSIS — S86819D Strain of other muscle(s) and tendon(s) at lower leg level, unspecified leg, subsequent encounter: Secondary | ICD-10-CM | POA: Diagnosis not present

## 2024-01-15 DIAGNOSIS — S86819S Strain of other muscle(s) and tendon(s) at lower leg level, unspecified leg, sequela: Secondary | ICD-10-CM | POA: Diagnosis not present

## 2024-01-19 NOTE — Progress Notes (Unsigned)
 [facility] [address] [place] [address] Phone: [phone] Subjective:   Paul [name], am serving as a scribe for Dr. Antoine Primas.  I'm seeing this patient by the request  of:  Noberto Retort, MD  CC: right shoulder pain, neck pain, back pain, left knee pain follow-up  VHQ:[id number]   12/12/2023 Patient previously did have a TFCC injury long ago.  May be some mild exacerbation but seems to be more of a tendinitis and sprain.  Discussed with patient about icing regimen and home exercises otherwise.  Discussed increasing activity slowly.  Discussed icing regimen.  Brace in order taping and follow-up again in 6 to 8 weeks    Update 01/21/2024 Paul [name] is a 73 y.o. male coming in with complaint of right wrist pain. Patient states that his wrist is doing well. Racing in 10 days in [place]   Running continues to cause lateral L proximal calf pain. Wonders if he needs another injection. Ok on short runs but pain worse with longer runs.   Neck and R shoulder pain continues when swimming. PT is helpful but posterior deltoid and tricep get sore when swimming. Using gabapentin for relief at night.   Also c/o pain in R distal calf musculotendinous junction. Occurs when he gets off the bike and starts running. Tried using insole for arch and do exercises which was helpful but when he uses insole on [employer] Carbon X and this causes his pain to increase. Took insoles out and his pain was less but still present.   Went for a ride on Saturday and he has had a cough since then. Able to ride today despite symptoms. No nasal congestion but does have cough. Worried due to hx of bronchitis.     Past Medical History:  Diagnosis Date   Actinic keratosis    Cataract    Depression    RESOLVED   Hyperlipidemia    Spinal stenosis    WAS HAVING NECK PAIN -PT STATES NERVE ABLATION PROCEDURE 2012 AND PAIN HAS RESOLVED   Spondylitis (HCC)    Past  Surgical History:  Procedure Laterality Date   APPENDECTOMY  1962   CORONARY PRESSURE/FFR STUDY N/A 05/12/2023   Procedure: CORONARY PRESSURE/FFR STUDY;  Surgeon: Marykay Lex, MD;  Location: [facility] INVASIVE CV LAB;  Service: Cardiovascular;  Laterality: N/A;   CORONARY STENT INTERVENTION N/A 05/12/2023   Procedure: CORONARY STENT INTERVENTION;  Surgeon: Marykay Lex, MD;  Location: [facility] INVASIVE CV LAB;  Service: Cardiovascular;  Laterality: N/A;   HERNIA REPAIR     INSERTION OF MESH  10/06/2012   Procedure: INSERTION OF MESH;  Surgeon: [name]. Corliss Skains, MD;  Location: [facility] ORS;  Service: General;  Laterality: N/A;   KNEE SURGERY  1980 & 1982   left   LEFT HEART CATH AND CORONARY ANGIOGRAPHY N/A 05/12/2023   Procedure: LEFT HEART CATH AND CORONARY ANGIOGRAPHY;  Surgeon: Marykay Lex, MD;  Location: [facility] INVASIVE CV LAB;  Service: Cardiovascular;  Laterality: N/A;   TONSILLECTOMY  1968   UMBILICAL HERNIA REPAIR  10/06/2012   Procedure: HERNIA REPAIR UMBILICAL ADULT;  Surgeon: [name]. Corliss Skains, MD;  Location: [facility] ORS;  Service: General;  Laterality: N/A;   Social History   Socioeconomic History   Marital status: Married    Spouse name: Not on file   Number of children: Not on file   Years of education: Not on file   Highest education level: Not  on file  Occupational History   Not on file  Tobacco Use   Smoking status: Never   Smokeless tobacco: Never  Vaping Use   Vaping status: Never Used  Substance and Sexual Activity   Alcohol use: Yes    Comment: OCCAS ALCOHOL   Drug use: No   Sexual activity: Not on file  Other Topics Concern   Not on file  Social History Narrative   ** Merged History Encounter **       Social Drivers of Health   Financial Resource Strain: Not on file  Food Insecurity: No Food Insecurity (05/10/2023)   Hunger Vital Sign    Worried About Running Out of Food in the Last Year: Never true    Ran Out of Food in the Last Year: Never true  Transportation  Needs: No Transportation Needs (05/10/2023)   PRAPARE - Administrator, Civil Service (Medical): No    Lack of Transportation (Non-Medical): No  Physical Activity: Not on file  Stress: Not on file  Social Connections: Not on file   Allergies  Allergen Reactions   Crestor [Rosuvastatin] Other (See Comments)    Myalgias  Pt currently taking 20mg  QD   Lipitor [Atorvastatin] Other (See Comments)    Liver toxicity   Family History  Problem Relation Age of Onset   Stroke Mother    Heart disease Father    Cancer Sister        bone    Current Outpatient Medications (Endocrine & Metabolic):    predniSONE (DELTASONE) 20 MG tablet, Take 2 tablets (40 mg total) by mouth daily with breakfast.  Current Outpatient Medications (Cardiovascular):    isosorbide mononitrate (IMDUR) 30 MG 24 hr tablet, Take 1 tablet (30 mg total) by mouth daily.   nitroGLYCERIN (NITROSTAT) 0.4 MG SL tablet, Place 1 tablet (0.4 mg total) under the tongue every 5 (five) minutes x 3 doses as needed for chest pain.   rosuvastatin (CRESTOR) 20 MG tablet, Take 1 tablet (20 mg total) by mouth daily.   sildenafil (VIAGRA) 100 MG tablet, Take 100 mg by mouth daily as needed for erectile dysfunction.   Current Outpatient Medications (Analgesics):    aspirin EC 81 MG tablet, Take 1 tablet (81 mg total) by mouth daily. Swallow whole.  Current Outpatient Medications (Hematological):    prasugrel (EFFIENT) 10 MG TABS tablet, Take 6 tablets by mouth 24 hours after last Brilinta dose. Then 24 hours later take 1 tablet (10mg ) by mouth daily. (Patient taking differently: Take 6 tablets by mouth 24)  Current Outpatient Medications (Other):    doxycycline (VIBRA-TABS) 100 MG tablet, Take 1 tablet (100 mg total) by mouth 2 (two) times daily.   DULoxetine (CYMBALTA) 60 MG capsule, Take 1 capsule (60 mg total) by mouth daily.   gabapentin (NEURONTIN) 100 MG capsule, Take 2 capsules (200 mg total) by mouth 3 (three) times  daily.   Vitamin D, Ergocalciferol, (DRISDOL) 1.25 MG (50000 UNIT) CAPS capsule, Take 1 capsule (50,000 Units total) by mouth every 7 (seven) days.   Reviewed prior external information including notes and imaging from  primary care provider As well as notes that were available from care everywhere and other healthcare systems.  Past medical history, social, surgical and family history all reviewed in electronic medical record.  No pertanent information unless stated regarding to the chief complaint.   Review of Systems:  No headache, visual changes, nausea, vomiting, diarrhea, constipation, dizziness, abdominal pain, skin rash, fevers, chills, night  sweats, weight loss, swollen lymph nodes, body aches, joint swelling, chest pain, shortness of breath, mood changes. POSITIVE muscle aches  Objective  Blood pressure 122/70, pulse 78, height 5\' 11"  (1.803 m), weight 162 lb (73.5 kg), SpO2 98%.   General: No apparent distress alert and oriented x3 mood and affect normal, dressed appropriately.  HEENT: Pupils equal, extraocular movements intact  Respiratory: Patient's speak in full sentences and does not appear short of breath  Cardiovascular: No lower extremity edema, non tender, no erythema  Arthritic changes of multiple joint.  Patient is shoulder does have positive impingement noted.  There is some tenderness to palpation noted.  Positive crossover noted. Left knee does have the instability of the knee noted.  Seems to be worse with valgus and varus force.  Trace effusion noted.  Back exam does have some loss lordosis.  Neck exam has some significant decrease in sidebending bilaterally.  Procedure: Real-time Ultrasound Guided Injection of right glenohumeral joint Device: GE Logiq Q7  Ultrasound guided injection is preferred based studies that show increased duration, increased effect, greater accuracy, decreased procedural pain, increased response rate with ultrasound guided versus blind  injection.  Verbal informed consent obtained.  Time-out conducted.  Noted no overlying erythema, induration, or other signs of local infection.  Skin prepped in a sterile fashion.  Local anesthesia: Topical Ethyl chloride.  With sterile technique and under real time ultrasound guidance:  Joint visualized.  23g 1  inch needle inserted posterior approach. Pictures taken for needle placement. Patient did have injection of  2 cc of 0.5% Marcaine, and 1.0 cc of Kenalog 40 mg/dL. Completed without difficulty  Pain immediately resolved suggesting accurate placement of the medication.  Images saved Advised to call if fevers/chills, erythema, induration, drainage, or persistent bleeding.  Impression: Technically successful ultrasound guided injection.  Procedure: Real-time Ultrasound Guided Injection of right acromioclavicular joint Device: GE Logiq Q7 Ultrasound guided injection is preferred based studies that show increased duration, increased effect, greater accuracy, decreased procedural pain, increased response rate, and decreased cost with ultrasound guided versus blind injection.  Verbal informed consent obtained.  Time-out conducted.  Noted no overlying erythema, induration, or other signs of local infection.  Skin prepped in a sterile fashion.  Local anesthesia: Topical Ethyl chloride.  With sterile technique and under real time ultrasound guidance: With a 25-gauge half inch needle injected with 0.5 cc of 0.5% Marcaine and 0.5 cc of Kenalog 40 mg/mL Completed without difficulty  Images saved Pain immediately improved suggesting accurate placement of the medication.  Advised to call if fevers/chills, erythema, induration, drainage, or persistent bleeding.  Impression: Technically successful ultrasound guided injection.  After informed written and verbal consent, patient was seated on exam table. Left knee was prepped with alcohol swab and utilizing anterolateral approach, patient's left  knee space was injected with 4:1  marcaine 0.5%: Kenalog 40mg /dL. Patient tolerated the procedure well without immediate complications.    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  Attempted osteopathic manipulation.  Patient is still doing well and will be running a [place]athlon in [place] in the next 10 days.  We do feel the patient will do very well.  Follow-up again in 6 to 8 weeks  Degenerative arthritis of left knee 6 months since patient has had an injection.  Should do very well.  Does have the ACL tear noted.  Has the brace needed for stability.  Discussed icing regimen.  Follow-up again 6 to 8 weeks  AC joint pain Chronic problem with exacerbation, has not been trending this much in quite some time.  Discussed icing regimen and home exercises, which activities to do and which ones to avoid.  Should do well and discussed potentially icing after activities.  Follow-up again in 6 to 8 weeks otherwise.  Right shoulder pain Chronic problem with worsening symptoms.  Discussed icing regimen and home exercises, discussed which activities to do and which ones to avoid.  Increase activity slowly over the course the next several weeks.  Discussed icing regimen.  Follow-up again in 6 to 8 weeks.   The above documentation has been reviewed and is accurate and complete Judi Saa, DO

## 2024-01-20 DIAGNOSIS — S86819D Strain of other muscle(s) and tendon(s) at lower leg level, unspecified leg, subsequent encounter: Secondary | ICD-10-CM | POA: Diagnosis not present

## 2024-01-20 DIAGNOSIS — S86819S Strain of other muscle(s) and tendon(s) at lower leg level, unspecified leg, sequela: Secondary | ICD-10-CM | POA: Diagnosis not present

## 2024-01-21 ENCOUNTER — Other Ambulatory Visit: Payer: Self-pay

## 2024-01-21 ENCOUNTER — Encounter: Payer: Self-pay | Admitting: Family Medicine

## 2024-01-21 ENCOUNTER — Ambulatory Visit: Payer: Medicare Other | Admitting: Family Medicine

## 2024-01-21 VITALS — BP 122/70 | HR 78 | Ht 71.0 in | Wt 162.0 lb

## 2024-01-21 DIAGNOSIS — M9902 Segmental and somatic dysfunction of thoracic region: Secondary | ICD-10-CM | POA: Diagnosis not present

## 2024-01-21 DIAGNOSIS — M9908 Segmental and somatic dysfunction of rib cage: Secondary | ICD-10-CM

## 2024-01-21 DIAGNOSIS — M9904 Segmental and somatic dysfunction of sacral region: Secondary | ICD-10-CM | POA: Diagnosis not present

## 2024-01-21 DIAGNOSIS — M25531 Pain in right wrist: Secondary | ICD-10-CM

## 2024-01-21 DIAGNOSIS — M1712 Unilateral primary osteoarthritis, left knee: Secondary | ICD-10-CM

## 2024-01-21 DIAGNOSIS — M533 Sacrococcygeal disorders, not elsewhere classified: Secondary | ICD-10-CM | POA: Diagnosis not present

## 2024-01-21 DIAGNOSIS — M25511 Pain in right shoulder: Secondary | ICD-10-CM | POA: Diagnosis not present

## 2024-01-21 DIAGNOSIS — M9903 Segmental and somatic dysfunction of lumbar region: Secondary | ICD-10-CM | POA: Diagnosis not present

## 2024-01-21 DIAGNOSIS — G8929 Other chronic pain: Secondary | ICD-10-CM

## 2024-01-21 DIAGNOSIS — M9901 Segmental and somatic dysfunction of cervical region: Secondary | ICD-10-CM

## 2024-01-21 MED ORDER — PREDNISONE 20 MG PO TABS: 40.0000 mg | ORAL_TABLET | Freq: Every day | ORAL | 0 refills | Status: DC

## 2024-01-21 MED ORDER — DOXYCYCLINE HYCLATE 100 MG PO TABS: 100.0000 mg | ORAL_TABLET | Freq: Two times a day (BID) | ORAL | 0 refills | Status: DC

## 2024-01-21 NOTE — Assessment & Plan Note (Signed)
 Chronic problem with exacerbation, has not been trending this much in quite some time.  Discussed icing regimen and home exercises, which activities to do and which ones to avoid.  Should do well and discussed potentially icing after activities.  Follow-up again in 6 to 8 weeks otherwise.

## 2024-01-21 NOTE — Assessment & Plan Note (Signed)
 6 months since patient has had an injection.  Should do very well.  Does have the ACL tear noted.  Has the brace needed for stability.  Discussed icing regimen.  Follow-up again 6 to 8 weeks

## 2024-01-21 NOTE — Assessment & Plan Note (Signed)
 Chronic problem with worsening symptoms.  Discussed icing regimen and home exercises, discussed which activities to do and which ones to avoid.  Increase activity slowly over the course the next several weeks.  Discussed icing regimen.  Follow-up again in 6 to 8 weeks.

## 2024-01-21 NOTE — Assessment & Plan Note (Signed)
 Sacroiliac joint dysfunction noted.  Attempted osteopathic manipulation.  Patient is still doing well and will be running a triathlon in [place] in the next 10 days.  We do feel the patient will do very well.  Follow-up again in 6 to 8 weeks

## 2024-01-21 NOTE — Patient Instructions (Signed)
 Pred 40 for 5 days if needed Doxy 100mg  BID for 10 days if needed Injected shoulder in 2 spots Injected knee today See me in 7 weeks

## 2024-01-22 DIAGNOSIS — S86819S Strain of other muscle(s) and tendon(s) at lower leg level, unspecified leg, sequela: Secondary | ICD-10-CM | POA: Diagnosis not present

## 2024-01-22 DIAGNOSIS — S86819D Strain of other muscle(s) and tendon(s) at lower leg level, unspecified leg, subsequent encounter: Secondary | ICD-10-CM | POA: Diagnosis not present

## 2024-01-27 DIAGNOSIS — S86819D Strain of other muscle(s) and tendon(s) at lower leg level, unspecified leg, subsequent encounter: Secondary | ICD-10-CM | POA: Diagnosis not present

## 2024-01-27 DIAGNOSIS — S86819S Strain of other muscle(s) and tendon(s) at lower leg level, unspecified leg, sequela: Secondary | ICD-10-CM | POA: Diagnosis not present

## 2024-02-04 DIAGNOSIS — S86819S Strain of other muscle(s) and tendon(s) at lower leg level, unspecified leg, sequela: Secondary | ICD-10-CM | POA: Diagnosis not present

## 2024-02-04 DIAGNOSIS — S86819D Strain of other muscle(s) and tendon(s) at lower leg level, unspecified leg, subsequent encounter: Secondary | ICD-10-CM | POA: Diagnosis not present

## 2024-02-10 DIAGNOSIS — S86819S Strain of other muscle(s) and tendon(s) at lower leg level, unspecified leg, sequela: Secondary | ICD-10-CM | POA: Diagnosis not present

## 2024-02-10 DIAGNOSIS — S86819D Strain of other muscle(s) and tendon(s) at lower leg level, unspecified leg, subsequent encounter: Secondary | ICD-10-CM | POA: Diagnosis not present

## 2024-02-14 ENCOUNTER — Other Ambulatory Visit (HOSPITAL_BASED_OUTPATIENT_CLINIC_OR_DEPARTMENT_OTHER): Payer: Self-pay

## 2024-02-16 ENCOUNTER — Other Ambulatory Visit (HOSPITAL_BASED_OUTPATIENT_CLINIC_OR_DEPARTMENT_OTHER): Payer: Self-pay

## 2024-02-17 DIAGNOSIS — S86819S Strain of other muscle(s) and tendon(s) at lower leg level, unspecified leg, sequela: Secondary | ICD-10-CM | POA: Diagnosis not present

## 2024-02-17 DIAGNOSIS — S86819D Strain of other muscle(s) and tendon(s) at lower leg level, unspecified leg, subsequent encounter: Secondary | ICD-10-CM | POA: Diagnosis not present

## 2024-02-26 DIAGNOSIS — S86819S Strain of other muscle(s) and tendon(s) at lower leg level, unspecified leg, sequela: Secondary | ICD-10-CM | POA: Diagnosis not present

## 2024-02-26 DIAGNOSIS — S86819D Strain of other muscle(s) and tendon(s) at lower leg level, unspecified leg, subsequent encounter: Secondary | ICD-10-CM | POA: Diagnosis not present

## 2024-02-27 ENCOUNTER — Encounter: Payer: Self-pay | Admitting: Cardiology

## 2024-02-27 ENCOUNTER — Other Ambulatory Visit: Payer: Self-pay | Admitting: Family Medicine

## 2024-02-27 MED ORDER — VITAMIN D (ERGOCALCIFEROL) 1.25 MG (50000 UNIT) PO CAPS: 50000.0000 [IU] | ORAL_CAPSULE | ORAL | 0 refills | Status: DC

## 2024-03-01 ENCOUNTER — Telehealth: Payer: Self-pay

## 2024-03-01 ENCOUNTER — Encounter: Payer: Self-pay | Admitting: Family Medicine

## 2024-03-01 NOTE — Telephone Encounter (Signed)
   Pre-operative Risk Assessment    Patient Name: Paul [name]  DOB: 21-Jun-1951 MRN: [id number]   Date of last office visit: 08/25/23 Lovette Rud, PA-C Date of next office visit: NONE   Request for Surgical Clearance    Procedure:   LUMBAR EPIDURAL  Date of Surgery:  Clearance TBD                                Surgeon:  NOT INDICATED Surgeon's Group or Practice Name:  [facility] Phone number:  [phone] Fax number:  [phone]   Type of Clearance Requested:   - Medical  - Pharmacy:  Hold Prasugrel (Effient) 7 DAYS PRIOR AND ASPIRIN   Type of Anesthesia:  Not Indicated   Additional requests/questions:    Signed, Collin Deal   03/01/2024, 5:29 PM

## 2024-03-03 ENCOUNTER — Telehealth: Payer: Self-pay

## 2024-03-03 ENCOUNTER — Other Ambulatory Visit: Payer: Self-pay

## 2024-03-03 DIAGNOSIS — M5412 Radiculopathy, cervical region: Secondary | ICD-10-CM

## 2024-03-03 NOTE — Telephone Encounter (Signed)
  Patient Consent for Virtual Visit        Paul [name] has provided verbal consent on 03/03/2024 for a virtual visit (video or telephone).   CONSENT FOR VIRTUAL VISIT FOR:  Paul [name]  By participating in this virtual visit I agree to the following:  I hereby voluntarily request, consent and authorize [facility] and its employed or contracted physicians, physician assistants, nurse practitioners or other licensed health care professionals (the Practitioner), to provide me with telemedicine health care services (the "Services") as deemed necessary by the treating Practitioner. I acknowledge and consent to receive the Services by the Practitioner via telemedicine. I understand that the telemedicine visit will involve communicating with the Practitioner through live audiovisual communication technology and the disclosure of certain medical information by electronic transmission. I acknowledge that I have been given the opportunity to request an in-person assessment or other available alternative prior to the telemedicine visit and am voluntarily participating in the telemedicine visit.  I understand that I have the right to withhold or withdraw my consent to the use of telemedicine in the course of my care at any time, without affecting my right to future care or treatment, and that the Practitioner or I may terminate the telemedicine visit at any time. I understand that I have the right to inspect all information obtained and/or recorded in the course of the telemedicine visit and may receive copies of available information for a reasonable fee.  I understand that some of the potential risks of receiving the Services via telemedicine include:  Delay or interruption in medical evaluation due to technological equipment failure or disruption; Information transmitted may not be sufficient (e.g. poor resolution of images) to allow for appropriate medical decision making by the  Practitioner; and/or  In rare instances, security protocols could fail, causing a breach of personal health information.  Furthermore, I acknowledge that it is my responsibility to provide information about my medical history, conditions and care that is complete and accurate to the best of my ability. I acknowledge that Practitioner's advice, recommendations, and/or decision may be based on factors not within their control, such as incomplete or inaccurate data provided by me or distortions of diagnostic images or specimens that may result from electronic transmissions. I understand that the practice of medicine is not an exact science and that Practitioner makes no warranties or guarantees regarding treatment outcomes. I acknowledge that a copy of this consent can be made available to me via my patient portal [facility] MyChart), or I can request a printed copy by calling the office of [facility].    I understand that my insurance will be billed for this visit.   I have read or had this consent read to me. I understand the contents of this consent, which adequately explains the benefits and risks of the Services being provided via telemedicine.  I have been provided ample opportunity to ask questions regarding this consent and the Services and have had my questions answered to my satisfaction. I give my informed consent for the services to be provided through the use of telemedicine in my medical care

## 2024-03-03 NOTE — Telephone Encounter (Signed)
 Preop televisit now scheduled, med rec and consent done.

## 2024-03-03 NOTE — Telephone Encounter (Signed)
   Name: Paul [name]  DOB: 11-20-1950  MRN: [id number]  Primary Cardiologist: Dorothye Gathers, MD   Preoperative team, please contact this patient and set up a phone call appointment for further preoperative risk assessment. Please obtain consent and complete medication review. Thank you for your help.  I confirm that guidance regarding antiplatelet and oral anticoagulation therapy has been completed and, if necessary, noted below.  Per Dr. Renna Cary patient can hold Effient 5 days prior to procedure and should continue ASA 81 mg through perioperative period.  I also confirmed the patient resides in the state of  . As per [facility] Medical Board telemedicine laws, the patient must reside in the state in which the provider is licensed.   Francene Ing, Retha Cast, NP 03/03/2024, 8:32 AM [facility]

## 2024-03-04 DIAGNOSIS — S86819D Strain of other muscle(s) and tendon(s) at lower leg level, unspecified leg, subsequent encounter: Secondary | ICD-10-CM | POA: Diagnosis not present

## 2024-03-04 DIAGNOSIS — S86819S Strain of other muscle(s) and tendon(s) at lower leg level, unspecified leg, sequela: Secondary | ICD-10-CM | POA: Diagnosis not present

## 2024-03-04 NOTE — Progress Notes (Unsigned)
 [facility] [address] [place] [address] Phone: [phone] Subjective:   Paul [name], am serving as a scribe for Dr. Ronnell Coins.  I'm seeing this patient by the request  of:  Roselind Congo, MD  CC: Neck pain, shoulder pain, back pain, knee pain  [id number]  01/21/2024 Chronic problem with worsening symptoms.  Discussed icing regimen and home exercises, discussed which activities to do and which ones to avoid.  Increase activity slowly over the course the next several weeks.  Discussed icing regimen.  Follow-up again in 6 to 8 weeks.     Chronic problem with exacerbation, has not been trending this much in quite some time.  Discussed icing regimen and home exercises, which activities to do and which ones to avoid.  Should do well and discussed potentially icing after activities.  Follow-up again in 6 to 8 weeks otherwise.    6 months since patient has had an injection. Should do very well. Does have the ACL tear noted. Has the brace needed for stability. Discussed icing regimen. Follow-up again 6 to 8 weeks  Sacroiliac joint dysfunction noted.  Attempted osteopathic manipulation.  Patient is still doing well and will be running a triath[place] in [place] in the next 10 days.  We do feel the patient will do very well.  Follow-up again in 6 to 8 weeks     Updated 03/10/2024 Paul [name] is a 73 y.o. male coming in with complaint of polyarthralgia. Patient states that he has virtual visit with preop team on Friday. Continues to have stiffness in R trap and soreness into the upper arm. Going to PT 2x a week.   R quad pain when running with [employer] over VMO. Does not have pain when wearing [employer]'s. Has soreness in muscle today. No pain in VMO once he warms up on the bike. When he pushes his great toe down on the pedal his pain in VMO is also less.   Also having pain over L lateral epicondyle. Felt he was overcompensating with the R shoulder  and neck being painful.   Pain in R lateral hip for past month. Can extend into the hamstring.   Off of gabapentin . Taking duloxetine  60mg  and wonders if this is enough.    Patient was to have the epidural but is still awaiting cardiac clearance.    Past Medical History:  Diagnosis Date   Actinic keratosis    Cataract    Depression    RESOLVED   Hyperlipidemia    Spinal stenosis    WAS HAVING NECK PAIN -PT STATES NERVE ABLATION PROCEDURE 2012 AND PAIN HAS RESOLVED   Spondylitis (HCC)    Past Surgical History:  Procedure Laterality Date   APPENDECTOMY  1962   CORONARY PRESSURE/FFR STUDY N/A 05/12/2023   Procedure: CORONARY PRESSURE/FFR STUDY;  Surgeon: Arleen Lacer, MD;  Location: [facility] INVASIVE CV LAB;  Service: Cardiovascular;  Laterality: N/A;   CORONARY STENT INTERVENTION N/A 05/12/2023   Procedure: CORONARY STENT INTERVENTION;  Surgeon: Arleen Lacer, MD;  Location: [facility] INVASIVE CV LAB;  Service: Cardiovascular;  Laterality: N/A;   HERNIA REPAIR     INSERTION OF MESH  10/06/2012   Procedure: INSERTION OF MESH;  Surgeon: Kari Otto. Eli Grizzle, MD;  Location: [facility];  Service: General;  Laterality: N/A;   KNEE SURGERY  1980 & 1982   left   LEFT HEART CATH AND CORONARY ANGIOGRAPHY N/A 05/12/2023   Procedure: LEFT HEART  CATH AND CORONARY ANGIOGRAPHY;  Surgeon: Arleen Lacer, MD;  Location: [facility] INVASIVE CV LAB;  Service: Cardiovascular;  Laterality: N/A;   TONSILLECTOMY  1968   UMBILICAL HERNIA REPAIR  10/06/2012   Procedure: HERNIA REPAIR UMBILICAL ADULT;  Surgeon: Kari Otto. Eli Grizzle, MD;  Location: [facility];  Service: General;  Laterality: N/A;   Social History   Socioeconomic History   Marital status: Married    Spouse name: Not on file   Number of children: Not on file   Years of education: Not on file   Highest education level: Not on file  Occupational History   Not on file  Tobacco Use   Smoking status: Never   Smokeless tobacco: Never  Vaping Use   Vaping status:  Never Used  Substance and Sexual Activity   Alcohol use: Yes    Comment: OCCAS ALCOHOL   Drug use: No   Sexual activity: Not on file  Other Topics Concern   Not on file  Social History Narrative   ** Merged History Encounter **       Social Drivers of Health   Financial Resource Strain: Not on file  Food Insecurity: No Food Insecurity (05/10/2023)   Hunger Vital Sign    Worried About Running Out of Food in the Last Year: Never true    Ran Out of Food in the Last Year: Never true  Transportation Needs: No Transportation Needs (05/10/2023)   PRAPARE - Administrator, Civil Service (Medical): No    Lack of Transportation (Non-Medical): No  Physical Activity: Not on file  Stress: Not on file  Social Connections: Not on file   Allergies  Allergen Reactions   Crestor  [Rosuvastatin ] Other (See Comments)    Myalgias  Pt currently taking 20mg  QD   Lipitor [Atorvastatin] Other (See Comments)    Liver toxicity   Family History  Problem Relation Age of Onset   Stroke Mother    Heart disease Father    Cancer Sister        bone    Current Outpatient Medications (Endocrine & Metabolic):    predniSONE  (DELTASONE ) 20 MG tablet, Take 2 tablets (40 mg total) by mouth daily with breakfast.  Current Outpatient Medications (Cardiovascular):    isosorbide  mononitrate (IMDUR ) 30 MG 24 hr tablet, Take 1 tablet (30 mg total) by mouth daily.   nitroGLYCERIN  (NITROSTAT ) 0.4 MG SL tablet, Place 1 tablet (0.4 mg total) under the tongue every 5 (five) minutes x 3 doses as needed for chest pain.   rosuvastatin  (CRESTOR ) 20 MG tablet, Take 1 tablet (20 mg total) by mouth daily.   sildenafil (VIAGRA) 100 MG tablet, Take 100 mg by mouth daily as needed for erectile dysfunction.   Current Outpatient Medications (Analgesics):    aspirin  EC 81 MG tablet, Take 1 tablet (81 mg total) by mouth daily. Swallow whole.  Current Outpatient Medications (Hematological):    prasugrel  (EFFIENT ) 10 MG  TABS tablet, Take 6 tablets by mouth 24 hours after last Brilinta  dose. Then 24 hours later take 1 tablet (10mg ) by mouth daily. (Patient taking differently: Take 6 tablets by mouth 24)  Current Outpatient Medications (Other):    doxycycline  (VIBRA -TABS) 100 MG tablet, Take 1 tablet (100 mg total) by mouth 2 (two) times daily.   DULoxetine  (CYMBALTA ) 60 MG capsule, Take 1 capsule (60 mg total) by mouth daily.   gabapentin  (NEURONTIN ) 100 MG capsule, Take 2 capsules (200 mg total) by mouth 3 (three) times daily. (Patient  not taking: Reported on 03/03/2024)   Vitamin D , Ergocalciferol , (DRISDOL ) 1.25 MG (50000 UNIT) CAPS capsule, Take 1 capsule (50,000 Units total) by mouth every 7 (seven) days.   Reviewed prior external information including notes and imaging from  primary care provider As well as notes that were available from care everywhere and other healthcare systems.  Past medical history, social, surgical and family history all reviewed in electronic medical record.  No pertanent information unless stated regarding to the chief complaint.   Review of Systems:  No headache, visual changes, nausea, vomiting, diarrhea, constipation, dizziness, abdominal pain, skin rash, fevers, chills, night sweats, weight loss, swollen lymph nodes, body aches, joint swelling, chest pain, shortness of breath, mood changes. POSITIVE muscle aches  Objective  There were no vitals taken for this visit.   General: No apparent distress alert and oriented x3 mood and affect normal, dressed appropriately.  HEENT: Pupils equal, extraocular movements intact  Respiratory: Patient's speak in full sentences and does not appear short of breath  Cardiovascular: No lower extremity edema, non tender, no erythema     Osteopathic findings Cervical C2 flexed rotated and side bent right C4 flexed rotated and side bent left C6 flexed rotated and side bent left T3 extended rotated and side bent right inhaled third rib T9  extended rotated and side bent left L2 flexed rotated and side bent right Sacrum right on right  Impression and Recommendations:    No problem-specific Assessment & Plan notes found for this encounter.     Decision today to treat with OMT was based on Physical Exam  After verbal consent patient was treated with HVLA, ME, FPR techniques in cervical, thoracic, rib, lumbar and sacral areas, all areas are chronic   Patient tolerated the procedure well with improvement in symptoms  Patient given exercises, stretches and lifestyle modifications  See medications in patient instructions if given  Patient will follow up in 4-8 weeks   The above documentation has been reviewed and is accurate and complete Nirav Sweda M Olajuwon Fosdick, DO

## 2024-03-10 ENCOUNTER — Ambulatory Visit: Admitting: Family Medicine

## 2024-03-10 ENCOUNTER — Ambulatory Visit (INDEPENDENT_AMBULATORY_CARE_PROVIDER_SITE_OTHER)

## 2024-03-10 VITALS — BP 120/82 | Ht 71.0 in | Wt 158.0 lb

## 2024-03-10 DIAGNOSIS — M1711 Unilateral primary osteoarthritis, right knee: Secondary | ICD-10-CM

## 2024-03-10 DIAGNOSIS — M25511 Pain in right shoulder: Secondary | ICD-10-CM | POA: Diagnosis not present

## 2024-03-10 DIAGNOSIS — M25551 Pain in right hip: Secondary | ICD-10-CM | POA: Diagnosis not present

## 2024-03-10 DIAGNOSIS — M5030 Other cervical disc degeneration, unspecified cervical region: Secondary | ICD-10-CM | POA: Diagnosis not present

## 2024-03-10 DIAGNOSIS — M9902 Segmental and somatic dysfunction of thoracic region: Secondary | ICD-10-CM | POA: Diagnosis not present

## 2024-03-10 DIAGNOSIS — M9908 Segmental and somatic dysfunction of rib cage: Secondary | ICD-10-CM | POA: Diagnosis not present

## 2024-03-10 DIAGNOSIS — M9903 Segmental and somatic dysfunction of lumbar region: Secondary | ICD-10-CM

## 2024-03-10 DIAGNOSIS — M9904 Segmental and somatic dysfunction of sacral region: Secondary | ICD-10-CM | POA: Diagnosis not present

## 2024-03-10 DIAGNOSIS — M9901 Segmental and somatic dysfunction of cervical region: Secondary | ICD-10-CM

## 2024-03-10 DIAGNOSIS — M160 Bilateral primary osteoarthritis of hip: Secondary | ICD-10-CM | POA: Diagnosis not present

## 2024-03-10 MED ORDER — LEVOTHYROXINE SODIUM 50 MCG PO TABS: 50.0000 ug | ORAL_TABLET | Freq: Every day | ORAL | 0 refills | Status: DC

## 2024-03-10 NOTE — Patient Instructions (Signed)
 No overhand lifting Lift with thumbs up Hamstring thigh compression w repetitive motion Small heel lifts Plica syndrome for knee: Graston tool Will get epidural See me in 6-8 weeks

## 2024-03-11 ENCOUNTER — Encounter: Payer: Self-pay | Admitting: Family Medicine

## 2024-03-11 NOTE — Assessment & Plan Note (Signed)
 Has some arthritis and what appears to be more of a plica.  Nontender injection for nearly 1-1/2 years.  Will still hold at this time.  Discussed icing massage and as long as it does not stop him from activity he should continue to do everything he would like.  Follow-up again in 6 to 8 weeks

## 2024-03-11 NOTE — Assessment & Plan Note (Signed)
 Will continue to monitor.  They can give him some difficulty but at this moment think patient do okay with conservative therapy.  Worsening pain will consider injection again

## 2024-03-11 NOTE — Progress Notes (Unsigned)
 Virtual Visit via Telephone Note   Because of Paul [name] co-morbid illnesses, he is at least at moderate risk for complications without adequate follow up.  This format is felt to be most appropriate for this patient at this time.  Due to technical limitations with video connection (technology), today's appointment will be conducted as an audio only telehealth visit, and [name] verbally agreed to proceed in this manner.   All issues noted in this document were discussed and addressed.  No physical exam could be performed with this format.  Evaluation Performed:  Preoperative cardiovascular risk assessment _____________   Date:  03/11/2024   Patient ID:  Paul [name], DOB [birth year], MRN [id number] Patient Location:  Home Provider location:   Office  Primary Care Provider:  Roselind Congo, MD Primary Cardiologist:  Dorothye Gathers, MD  Chief Complaint / Patient Profile   73 y.o. y/o male with a h/o hyperlipidemia, coronary artery disease who is pending lumbar epidural and presents today for telephonic preoperative cardiovascular risk assessment.  History of Present Illness    Paul [name] is a 73 y.o. male who presents via audio/video conferencing for a telehealth visit today.  Pt was last seen in cardiology clinic on 08/25/2023 by Lovette Rud, PA-C.  At that time [name] was doing well .  The patient is now pending procedure as outlined above. Since his last visit, he continues to be stable from a cardiac standpoint.  Today he denies chest pain, shortness of breath, lower extremity edema, fatigue, palpitations, melena, hematuria, hemoptysis, diaphoresis, weakness, presyncope, syncope, orthopnea, and PND.   Past Medical History    Past Medical History:  Diagnosis Date   Actinic keratosis    Cataract    Depression    RESOLVED   Hyperlipidemia    Spinal stenosis    WAS HAVING NECK PAIN -PT STATES NERVE ABLATION PROCEDURE 2012 AND PAIN HAS RESOLVED    Spondylitis (HCC)    Past Surgical History:  Procedure Laterality Date   APPENDECTOMY  1962   CORONARY PRESSURE/FFR STUDY N/A 05/12/2023   Procedure: CORONARY PRESSURE/FFR STUDY;  Surgeon: Arleen Lacer, MD;  Location: [facility] INVASIVE CV LAB;  Service: Cardiovascular;  Laterality: N/A;   CORONARY STENT INTERVENTION N/A 05/12/2023   Procedure: CORONARY STENT INTERVENTION;  Surgeon: Arleen Lacer, MD;  Location: [facility] INVASIVE CV LAB;  Service: Cardiovascular;  Laterality: N/A;   HERNIA REPAIR     INSERTION OF MESH  10/06/2012   Procedure: INSERTION OF MESH;  Surgeon: Kari Otto. Eli Grizzle, MD;  Location: [facility];  Service: General;  Laterality: N/A;   KNEE SURGERY  1980 & 1982   left   LEFT HEART CATH AND CORONARY ANGIOGRAPHY N/A 05/12/2023   Procedure: LEFT HEART CATH AND CORONARY ANGIOGRAPHY;  Surgeon: Arleen Lacer, MD;  Location: [facility] INVASIVE CV LAB;  Service: Cardiovascular;  Laterality: N/A;   TONSILLECTOMY  1968   UMBILICAL HERNIA REPAIR  10/06/2012   Procedure: HERNIA REPAIR UMBILICAL ADULT;  Surgeon: Kari Otto. Eli Grizzle, MD;  Location: [facility];  Service: General;  Laterality: N/A;    Allergies  Allergies  Allergen Reactions   Crestor  [Rosuvastatin ] Other (See Comments)    Myalgias  Pt currently taking 20mg  QD   Lipitor [Atorvastatin] Other (See Comments)    Liver toxicity    Home Medications    Prior to Admission medications   Medication Sig Start Date End Date Taking? Authorizing Provider  aspirin  EC 81 MG tablet Take  1 tablet (81 mg total) by mouth daily. Swallow whole. 05/13/23   Sanjuanita Cruz, NP  doxycycline  (VIBRA -TABS) 100 MG tablet Take 1 tablet (100 mg total) by mouth 2 (two) times daily. 01/21/24   Smith, Zachary M, DO  DULoxetine  (CYMBALTA ) 60 MG capsule Take 1 capsule (60 mg total) by mouth daily. 06/25/23   Isidro Margo, DO  isosorbide  mononitrate (IMDUR ) 30 MG 24 hr tablet Take 1 tablet (30 mg total) by mouth daily. 05/28/23   Hugh Madura, MD  levothyroxine   (SYNTHROID ) 50 MCG tablet Take 1 tablet (50 mcg total) by mouth daily before breakfast. 03/10/24   Isidro Margo, DO  nitroGLYCERIN  (NITROSTAT ) 0.4 MG SL tablet Place 1 tablet (0.4 mg total) under the tongue every 5 (five) minutes x 3 doses as needed for chest pain. 05/12/23   Sanjuanita Cruz, NP  prasugrel  (EFFIENT ) 10 MG TABS tablet Take 6 tablets by mouth 24 hours after last Brilinta  dose. Then 24 hours later take 1 tablet (10mg ) by mouth daily. Patient taking differently: Take 6 tablets by mouth 24 05/29/23   Hugh Madura, MD  rosuvastatin  (CRESTOR ) 20 MG tablet Take 1 tablet (20 mg total) by mouth daily. 03/24/19   Hugh Madura, MD  sildenafil (VIAGRA) 100 MG tablet Take 100 mg by mouth daily as needed for erectile dysfunction.    [provider]  Vitamin D , Ergocalciferol , (DRISDOL ) 1.25 MG (50000 UNIT) CAPS capsule Take 1 capsule (50,000 Units total) by mouth every 7 (seven) days. 02/27/24   Isidro Margo, DO    Physical Exam    Vital Signs:  [name] does not have vital signs available for review today.  Given telephonic nature of communication, physical exam is limited. AAOx3. NAD. Normal affect.  Speech and respirations are unlabored.  Accessory Clinical Findings    None  Assessment & Plan    1.  Preoperative Cardiovascular Risk Assessment: Procedure:   LUMBAR EPIDURAL   Date of Surgery:  Clearance TBD                                  Surgeon:  NOT INDICATED Surgeon's Group or Practice Name:  [facility] Phone number:  [phone] Fax number:  [phone]    Primary Cardiologist: Dorothye Gathers, MD  Chart reviewed as part of pre-operative protocol coverage. Given past medical history and time since last visit, based on ACC/AHA guidelines, ANANT AGARD would be at acceptable risk for the planned procedure without further cardiovascular testing.   He is able to complete greater than 4 METS of  physical activity.  Patient was advised that if he develops new symptoms prior to surgery to contact our office to arrange a follow-up appointment.  He verbalized understanding.  Per Dr. Renna Cary patient can hold Effient  5 days prior to procedure and May hold his aspirin  7 days prior to his procedure.  Effient  and aspirin  should be resumed as soon as possible after lumbar epidural.   I will route this recommendation to the requesting party via Epic fax function and remove from pre-op pool.       Time:   Today, I have spent 7 minutes with the patient with telehealth technology discussing medical history, symptoms, and management plan.  I spent 10 minutes reviewing his cardiac medications, cardiac test, and past medical history.   Carie Charity, NP  03/11/2024,  1:28 PM

## 2024-03-11 NOTE — Assessment & Plan Note (Addendum)
 Awaiting clearance so patient can have the epidural.  Hopefully will have this soon and I do think it will help out significantly.  Does have a race in November.  Follow-up again in 6 to 8 weeks added very low-dose of Synthroid  which I think could be beneficial with patient already on 60 mg of Cymbalta  to help with pain.

## 2024-03-12 ENCOUNTER — Ambulatory Visit: Attending: Cardiology

## 2024-03-12 DIAGNOSIS — Z01810 Encounter for preprocedural cardiovascular examination: Secondary | ICD-10-CM

## 2024-03-16 DIAGNOSIS — S86819D Strain of other muscle(s) and tendon(s) at lower leg level, unspecified leg, subsequent encounter: Secondary | ICD-10-CM | POA: Diagnosis not present

## 2024-03-16 DIAGNOSIS — S86819S Strain of other muscle(s) and tendon(s) at lower leg level, unspecified leg, sequela: Secondary | ICD-10-CM | POA: Diagnosis not present

## 2024-03-19 NOTE — Discharge Instructions (Signed)
 Post Procedure Spinal Discharge Instruction Sheet  You may resume a regular diet and any medications that you routinely take (including pain medications) unless otherwise noted by MD.  No driving day of procedure.  Light activity throughout the rest of the day.  Do not do any strenuous work, exercise, bending or lifting.  The day following the procedure, you can resume normal physical activity but you should refrain from exercising or physical therapy for at least three days thereafter.  You may apply ice to the injection site, 20 minutes on, 20 minutes off, as needed. Do not apply ice directly to skin.    Common Side Effects:  Headaches- take your usual medications as directed by your physician.  Increase your fluid intake.  Caffeinated beverages may be helpful.  Lie flat in bed until your headache resolves.  Restlessness or inability to sleep- you may have trouble sleeping for the next few days.  Ask your referring physician if you need any medication for sleep.  Facial flushing or redness- should subside within a few days.  Increased pain- a temporary increase in pain a day or two following your procedure is not unusual.  Take your pain medication as prescribed by your referring physician.  Leg cramps  Please contact our office at [phone] for the following symptoms: Fever greater than 100 degrees. Headaches unresolved with medication after 2-3 days. Increased swelling, pain, or redness at injection site.  YOU MAY RESUME YOUR DAILY ASPIRIN  TODAY. YOU MAY RESUME YOUR EFIENT TOMORROW.  Thank you for visiting [facility] today.

## 2024-03-22 ENCOUNTER — Encounter: Payer: Self-pay | Admitting: Family Medicine

## 2024-03-22 ENCOUNTER — Ambulatory Visit
Admission: RE | Admit: 2024-03-22 | Discharge: 2024-03-22 | Disposition: A | Discharge status: Home / Self Care | Source: Ambulatory Visit | Attending: Family Medicine | Admitting: Family Medicine

## 2024-03-22 DIAGNOSIS — M5412 Radiculopathy, cervical region: Secondary | ICD-10-CM

## 2024-03-22 DIAGNOSIS — M4722 Other spondylosis with radiculopathy, cervical region: Secondary | ICD-10-CM | POA: Diagnosis not present

## 2024-03-22 MED ORDER — TRIAMCINOLONE ACETONIDE 40 MG/ML IJ SUSP (RADIOLOGY)
60.0000 mg | Freq: Once | INTRAMUSCULAR | Status: AC
  Administered 2024-03-22: 60 mg via EPIDURAL

## 2024-03-22 MED ORDER — IOPAMIDOL (ISOVUE-M 300) INJECTION 61%
1.0000 mL | Freq: Once | INTRAMUSCULAR | Status: AC | PRN
  Administered 2024-03-22: 1 mL via EPIDURAL

## 2024-03-25 ENCOUNTER — Encounter: Payer: Self-pay | Admitting: Family Medicine

## 2024-03-25 DIAGNOSIS — S86819D Strain of other muscle(s) and tendon(s) at lower leg level, unspecified leg, subsequent encounter: Secondary | ICD-10-CM | POA: Diagnosis not present

## 2024-03-25 DIAGNOSIS — J069 Acute upper respiratory infection, unspecified: Secondary | ICD-10-CM | POA: Diagnosis not present

## 2024-03-25 DIAGNOSIS — S86819S Strain of other muscle(s) and tendon(s) at lower leg level, unspecified leg, sequela: Secondary | ICD-10-CM | POA: Diagnosis not present

## 2024-03-29 DIAGNOSIS — D518 Other vitamin B12 deficiency anemias: Secondary | ICD-10-CM | POA: Diagnosis not present

## 2024-03-29 DIAGNOSIS — Z0000 Encounter for general adult medical examination without abnormal findings: Secondary | ICD-10-CM | POA: Diagnosis not present

## 2024-03-29 DIAGNOSIS — I739 Peripheral vascular disease, unspecified: Secondary | ICD-10-CM | POA: Diagnosis not present

## 2024-03-29 DIAGNOSIS — Z125 Encounter for screening for malignant neoplasm of prostate: Secondary | ICD-10-CM | POA: Diagnosis not present

## 2024-03-29 DIAGNOSIS — E559 Vitamin D deficiency, unspecified: Secondary | ICD-10-CM | POA: Diagnosis not present

## 2024-03-29 DIAGNOSIS — I2510 Atherosclerotic heart disease of native coronary artery without angina pectoris: Secondary | ICD-10-CM | POA: Diagnosis not present

## 2024-03-29 DIAGNOSIS — E7800 Pure hypercholesterolemia, unspecified: Secondary | ICD-10-CM | POA: Diagnosis not present

## 2024-03-29 DIAGNOSIS — F3341 Major depressive disorder, recurrent, in partial remission: Secondary | ICD-10-CM | POA: Diagnosis not present

## 2024-03-29 DIAGNOSIS — N528 Other male erectile dysfunction: Secondary | ICD-10-CM | POA: Diagnosis not present

## 2024-04-20 NOTE — Progress Notes (Unsigned)
 [facility] [address] [place] [address] Phone: [phone] Subjective:   Paul [name], am serving as a scribe for Dr. Ronnell Coins.  I'm seeing this patient by the request  of:  Roselind Congo, MD  CC: Back and neck pain follow-up  [id number]  Paul [name] is a 73 y.o. male coming in with complaint of back and neck pain. OMT on 03/10/2024. Patient states doing well. A little better since last visit. Checking in on all things.  Medications patient has been prescribed: Synthroid   Taking:         Reviewed prior external information including notes and imaging from previsou exam, outside providers and external EMR if available.   As well as notes that were available from care everywhere and other healthcare systems.  Past medical history, social, surgical and family history all reviewed in electronic medical record.  No pertanent information unless stated regarding to the chief complaint.   Past Medical History:  Diagnosis Date   Actinic keratosis    Cataract    Depression    RESOLVED   Hyperlipidemia    Spinal stenosis    WAS HAVING NECK PAIN -PT STATES NERVE ABLATION PROCEDURE 2012 AND PAIN HAS RESOLVED   Spondylitis (HCC)     Allergies  Allergen Reactions   Crestor  [Rosuvastatin ] Other (See Comments)    Myalgias  Pt currently taking 20mg  QD   Lipitor [Atorvastatin] Other (See Comments)    Liver toxicity     Review of Systems:  No headache, visual changes, nausea, vomiting, diarrhea, constipation, dizziness, abdominal pain, skin rash, fevers, chills, night sweats, weight loss, swollen lymph nodes, body aches, joint swelling, chest pain, shortness of breath, mood changes. POSITIVE muscle aches  Objective  Blood pressure 122/60, pulse 62, height 5\' 11"  (1.803 m), weight 165 lb (74.8 kg), SpO2 98%.   General: No apparent distress alert and oriented x3 mood and affect normal, dressed appropriately.  HEENT:  Pupils equal, extraocular movements intact  Respiratory: Patient's speak in full sentences and does not appear short of breath  Cardiovascular: No lower extremity edema, non tender, no erythema  Gait relatively normal but bilateral feet do have significant onychomycosis of the large toe ingrown nails seem to be already partially coming off especially on the right foot. MSK:  Back does have less lordosis.  Neck exam still has severe loss of range of motion.  Patient does have maybe some improvement though in 5 degrees of extension of the neck.  Osteopathic findings  C2 flexed rotated and side bent right C6 flexed rotated and side bent right T3 extended rotated and side bent right inhaled rib T9 extended rotated and side bent left L1 flexed rotated and side bent left Sacrum right on right       Assessment and Plan:  Onychomycosis Patient does have onychomycosis.  Both large nails on either feet do need to be removed.  Patient will be referred to podiatry.  Follow-up with me again in 6 to 8 weeks  Degenerative disc disease, cervical Severe overall has responded relatively well though to the injections.  Discussed icing regimen and home exercises.  Discussed which activities to do and which ones to avoid.  Follow-up again in 6 to 8 weeks otherwise.    Nonallopathic problems  Decision today to treat with OMT was based on Physical Exam  After verbal consent patient was treated with HVLA, ME, FPR techniques in cervical, rib, thoracic, lumbar, and sacral  areas avoided HVLA on the neck  Patient tolerated the procedure well with improvement in symptoms  Patient given exercises, stretches and lifestyle modifications  See medications in patient instructions if given  Patient will follow up in 4-8 weeks     The above documentation has been reviewed and is accurate and complete Korynne Dols M Charley Miske, DO         Note: This dictation was prepared with Dragon dictation along with  smaller phrase technology. Any transcriptional errors that result from this process are unintentional.

## 2024-04-21 ENCOUNTER — Encounter: Payer: Self-pay | Admitting: Cardiology

## 2024-04-21 ENCOUNTER — Ambulatory Visit (INDEPENDENT_AMBULATORY_CARE_PROVIDER_SITE_OTHER): Admitting: Family Medicine

## 2024-04-21 VITALS — BP 122/60 | HR 62 | Ht 71.0 in | Wt 165.0 lb

## 2024-04-21 DIAGNOSIS — B351 Tinea unguium: Secondary | ICD-10-CM | POA: Diagnosis not present

## 2024-04-21 DIAGNOSIS — M1712 Unilateral primary osteoarthritis, left knee: Secondary | ICD-10-CM

## 2024-04-21 DIAGNOSIS — M9908 Segmental and somatic dysfunction of rib cage: Secondary | ICD-10-CM | POA: Diagnosis not present

## 2024-04-21 DIAGNOSIS — M9904 Segmental and somatic dysfunction of sacral region: Secondary | ICD-10-CM

## 2024-04-21 DIAGNOSIS — M9902 Segmental and somatic dysfunction of thoracic region: Secondary | ICD-10-CM | POA: Diagnosis not present

## 2024-04-21 DIAGNOSIS — M5030 Other cervical disc degeneration, unspecified cervical region: Secondary | ICD-10-CM | POA: Diagnosis not present

## 2024-04-21 DIAGNOSIS — R002 Palpitations: Secondary | ICD-10-CM

## 2024-04-21 DIAGNOSIS — M9903 Segmental and somatic dysfunction of lumbar region: Secondary | ICD-10-CM | POA: Diagnosis not present

## 2024-04-21 DIAGNOSIS — M9901 Segmental and somatic dysfunction of cervical region: Secondary | ICD-10-CM | POA: Diagnosis not present

## 2024-04-21 NOTE — Assessment & Plan Note (Signed)
 Patient does have onychomycosis.  Both large nails on either feet do need to be removed.  Patient will be referred to podiatry.  Follow-up with me again in 6 to 8 weeks

## 2024-04-21 NOTE — Patient Instructions (Addendum)
 Good to see you! Referral to podiatry I will send my note to cardiology, but would recommend calling them See you again in 6-8 weeks

## 2024-04-21 NOTE — Assessment & Plan Note (Signed)
 Severe overall has responded relatively well though to the injections.  Discussed icing regimen and home exercises.  Discussed which activities to do and which ones to avoid.  Follow-up again in 6 to 8 weeks otherwise.

## 2024-04-21 NOTE — Assessment & Plan Note (Signed)
 Known arthritic changes.  Will monitor closely.  Patient feels like he is doing well at the moment.  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  May need injections at a later date.

## 2024-04-22 ENCOUNTER — Ambulatory Visit: Attending: Cardiology

## 2024-04-22 DIAGNOSIS — R002 Palpitations: Secondary | ICD-10-CM

## 2024-04-22 NOTE — Telephone Encounter (Signed)
 Please see the MyChart message reply(ies) for my assessment and plan.    Lets go ahead and order you a Zio patch monitor that you can wear for 2 weeks in order to fully evaluate your flutters/palpitations.  I want to make sure that there is no evidence of atrial fibrillation present.   This patient gave consent for this Medical Advice Message and is aware that it may result in a bill to [employer], as well as the possibility of receiving a bill for a co-payment or deductible. They are an established patient, but are not seeking medical advice exclusively about a problem treated during an in person or video visit in the last seven days. I did not recommend an in person or video visit within seven days of my reply.    I spent a total of 5 minutes cumulative time within 7 days through [employer].  Dorothye Gathers, MD

## 2024-04-22 NOTE — Progress Notes (Unsigned)
 Enrolled patient for a 14 day Zio XT  monitor to be mailed to patients home

## 2024-04-29 ENCOUNTER — Encounter: Payer: Self-pay | Admitting: Podiatry

## 2024-04-29 ENCOUNTER — Ambulatory Visit: Admitting: Podiatry

## 2024-04-29 DIAGNOSIS — L600 Ingrowing nail: Secondary | ICD-10-CM | POA: Diagnosis not present

## 2024-04-29 NOTE — Patient Instructions (Signed)

## 2024-04-30 ENCOUNTER — Telehealth: Payer: Self-pay | Admitting: Podiatry

## 2024-04-30 NOTE — Telephone Encounter (Signed)
 Pt had bilateral 1st toenails removed.  He is experiencing continued bleeding and is concerned due to his use of blood thinners.

## 2024-04-30 NOTE — Progress Notes (Signed)
 Subjective:   Patient ID: Paul [name], male   DOB: 73 y.o.   MRN: [id number]   HPI Patient presents with extremely thickened dystrophic big toenails both feet that have gotten loose and are hard for him to work with.  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       Objective:  Physical Exam Vitals and nursing note reviewed.  Constitutional:      Appearance: He is well-developed.  Pulmonary:     Effort: Pulmonary effort is normal.   Musculoskeletal:        General: Normal range of motion.   Skin:    General: Skin is warm.   Neurological:     Mental Status: He is alert.     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  I went ahead today I explained procedure risk and patient signed consent form and I infiltrated each big toe 60 mg like Marcaine  mixture sterile prep done using sterile instrumentation remove the hallux nail exposed matrix applied phenol 5 applications 30 seconds followed by alcohol lavage sterile dressing gave instructions on soaks wear dressings 24 hours take them off earlier if throbbing were to occur and encouraged to call with questions concerns

## 2024-05-03 NOTE — Telephone Encounter (Signed)
 Bleeding should be stopped by now. Please give him a quick call and check on him

## 2024-05-07 DIAGNOSIS — Z23 Encounter for immunization: Secondary | ICD-10-CM | POA: Diagnosis not present

## 2024-05-10 ENCOUNTER — Encounter: Payer: Self-pay | Admitting: Podiatry

## 2024-05-12 ENCOUNTER — Ambulatory Visit (INDEPENDENT_AMBULATORY_CARE_PROVIDER_SITE_OTHER): Admitting: Podiatry

## 2024-05-12 ENCOUNTER — Encounter: Payer: Self-pay | Admitting: Podiatry

## 2024-05-12 DIAGNOSIS — L03031 Cellulitis of right toe: Secondary | ICD-10-CM

## 2024-05-12 MED ORDER — DOXYCYCLINE HYCLATE 100 MG PO TABS: 100.0000 mg | ORAL_TABLET | Freq: Two times a day (BID) | ORAL | 0 refills | Status: DC

## 2024-05-12 NOTE — Progress Notes (Signed)
 Subjective:   Patient ID: Paul [name], male   DOB: 73 y.o.   MRN: [id number]   HPI Patient presents concerned about redness around the big toe after having nail removed several weeks ago and states the right 1 seems slightly worse than the left and they can be tender   ROS      Objective:  Physical Exam  Neuro vascular status intact with patient found to have area of irritation in the right hallux nail bed over the left localized to the proximal area with no proximal edema erythema or drainage noted     Assessment:  Appears to be more of a low-grade paronychia right over left hallux that has only local appearance no indication of systemic issue.     Plan:  H&P reviewed and at this point I went ahead I advised this patient on soaks to continue bandage usage cushioning and as precautionary measure I placed him on doxycycline  twice daily for a week explaining how he needs to take this.  Reappoint to recheck

## 2024-05-19 DIAGNOSIS — R002 Palpitations: Secondary | ICD-10-CM | POA: Diagnosis not present

## 2024-05-23 DIAGNOSIS — R002 Palpitations: Secondary | ICD-10-CM | POA: Diagnosis not present

## 2024-05-24 ENCOUNTER — Other Ambulatory Visit: Payer: Self-pay | Admitting: *Deleted

## 2024-05-24 ENCOUNTER — Ambulatory Visit: Payer: Medicare Other | Attending: Physician Assistant

## 2024-05-24 DIAGNOSIS — I214 Non-ST elevation (NSTEMI) myocardial infarction: Secondary | ICD-10-CM

## 2024-05-24 DIAGNOSIS — Z955 Presence of coronary angioplasty implant and graft: Secondary | ICD-10-CM | POA: Diagnosis not present

## 2024-05-24 DIAGNOSIS — R030 Elevated blood-pressure reading, without diagnosis of hypertension: Secondary | ICD-10-CM

## 2024-05-24 DIAGNOSIS — E785 Hyperlipidemia, unspecified: Secondary | ICD-10-CM | POA: Diagnosis not present

## 2024-05-24 LAB — LIPID PANEL
Chol/HDL Ratio: 2.4 ratio (ref 0.0–5.0)
Cholesterol, Total: 176 mg/dL (ref 100–199)
HDL: 72 mg/dL (ref >39)
LDL Chol Calc (NIH): 86 mg/dL (ref 0–99)
Triglycerides: 103 mg/dL (ref 0–149)
VLDL Cholesterol Cal: 18 mg/dL (ref 5–40)

## 2024-05-25 ENCOUNTER — Ambulatory Visit: Payer: Self-pay | Admitting: Cardiology

## 2024-05-25 ENCOUNTER — Ambulatory Visit: Payer: Self-pay | Admitting: *Deleted

## 2024-05-25 DIAGNOSIS — Z955 Presence of coronary angioplasty implant and graft: Secondary | ICD-10-CM

## 2024-05-25 DIAGNOSIS — E785 Hyperlipidemia, unspecified: Secondary | ICD-10-CM

## 2024-05-25 DIAGNOSIS — Z79899 Other long term (current) drug therapy: Secondary | ICD-10-CM

## 2024-05-26 ENCOUNTER — Other Ambulatory Visit: Payer: Self-pay | Admitting: *Deleted

## 2024-05-26 DIAGNOSIS — I491 Atrial premature depolarization: Secondary | ICD-10-CM

## 2024-05-26 NOTE — Progress Notes (Signed)
 Referral to Cardiac EP per Dr. Jeffrie has been placed. Patient notified.

## 2024-05-27 NOTE — Telephone Encounter (Addendum)
 Results and recommendations sent to the pts active mychart account by Glendia Ferrier PA-C (covering for [employer] PA-C).    Tried calling the pt to endorse recommendations and he did not answer and unable to leave a voicemail.  Will send him a mychart message with recommendations, and let him know that I will place the 3 month lab order to check lipids and ALT in the system, and to come at that time and fasting.

## 2024-05-27 NOTE — Telephone Encounter (Signed)
 Stay on Rosuvastatin  20. Repeat Lipids, ALT in 3 mos - order under Tessa. Glendia Ferrier, PA-C    05/27/2024 2:45 PM

## 2024-05-28 NOTE — Progress Notes (Unsigned)
 Paul [name] Subjective:   ISusannah [name], am serving as a scribe for Dr. [name].  I'm seeing this patient by the request  of:  Paul Elsie SAUNDERS, MD  CC: Back and neck pain follow-up  YEP:Dlagzrupcz  KOUA DEEG is a 73 y.o. male coming in with complaint of back and neck pain. OMT on 04/21/2024. Patient states would like a Vit D refill. Having some R soleus discomfort and PAC diagnosis. Wants to talk about that.         Reviewed prior external information including notes and imaging from previsou exam, outside providers and external EMR if available.   As well as notes that were available from care everywhere and other healthcare systems.  Past medical history, social, surgical and family history all reviewed in electronic medical record.  No pertanent information unless stated regarding to the chief complaint.   Past Medical History:  Diagnosis Date   Actinic keratosis    Cataract    Depression    RESOLVED   Hyperlipidemia    Spinal stenosis    WAS HAVING NECK PAIN -PT STATES NERVE ABLATION PROCEDURE 2012 AND PAIN HAS RESOLVED   Spondylitis (HCC)     Allergies  Allergen Reactions   Crestor  [Rosuvastatin ] Other (See Comments)    Myalgias  Pt currently taking 20mg  QD   Lipitor [Atorvastatin] Other (See Comments)    Liver toxicity     Review of Systems:  No headache, visual changes, nausea, vomiting, diarrhea, constipation, dizziness, abdominal pain, skin rash, fevers, chills, night sweats, weight loss, swollen lymph nodes, body aches, joint swelling, chest pain, shortness of breath, mood changes. POSITIVE muscle aches  Objective  Blood pressure 120/74, pulse 84, height 5' 11 (1.803 m), weight 163 lb (73.9 kg), SpO2 98%.   General: No apparent distress alert and oriented x3 mood and affect normal, dressed appropriately.  HEENT: Pupils equal, extraocular movements  intact  Respiratory: Patient's speak in full sentences and does not appear short of breath  Cardiovascular: No lower extremity edema, non tender, no erythema  Gait MSK:  Back significant loss of lordosis of the neck.  Back exam does have some tenderness to palpation of the paraspinal musculature.  Osteopathic findings  C3 flexed rotated and side bent right C6 flexed rotated and side bent left T3 extended rotated and side bent right inhaled rib T9 extended rotated and side bent left L2 flexed rotated and side bent right L5 flexed rotated and side bent left  Sacrum right on right       Assessment and Plan:  SI (sacroiliac) joint dysfunction Discussed HEP  Discussed which activities to do and which ones to avoid.  Patient has responded well though to be exercises.  Undergoing the potential treatment by EP for PACs.  Will continue to monitor.    Nonallopathic problems  Decision today to treat with OMT was based on Physical Exam  After verbal consent patient was treated with HVLA, ME, FPR techniques in cervical, rib, thoracic, lumbar, and sacral  areas avoiding HVLA on the cervical spine  Patient tolerated the procedure well with improvement in symptoms  Patient given exercises, stretches and lifestyle modifications  See medications in patient instructions if given  Patient will follow up in 4-8 weeks     The above documentation has been reviewed and is accurate and complete Delano Scardino M Jahn Franchini, DO         Note:  This dictation was prepared with Dragon dictation along with smaller phrase technology. Any transcriptional errors that result from this process are unintentional.

## 2024-05-31 ENCOUNTER — Other Ambulatory Visit: Payer: Self-pay | Admitting: Cardiology

## 2024-06-01 ENCOUNTER — Other Ambulatory Visit (HOSPITAL_BASED_OUTPATIENT_CLINIC_OR_DEPARTMENT_OTHER): Payer: Self-pay

## 2024-06-01 MED ORDER — PRASUGREL HCL 10 MG PO TABS
10.0000 mg | ORAL_TABLET | Freq: Every day | ORAL | 0 refills | Status: DC
  Filled 2024-06-01: qty 90, 90d supply, fill #0

## 2024-06-02 ENCOUNTER — Ambulatory Visit (INDEPENDENT_AMBULATORY_CARE_PROVIDER_SITE_OTHER): Admitting: Family Medicine

## 2024-06-02 VITALS — BP 120/74 | HR 84 | Ht 71.0 in | Wt 163.0 lb

## 2024-06-02 DIAGNOSIS — M9904 Segmental and somatic dysfunction of sacral region: Secondary | ICD-10-CM

## 2024-06-02 DIAGNOSIS — M9908 Segmental and somatic dysfunction of rib cage: Secondary | ICD-10-CM

## 2024-06-02 DIAGNOSIS — M533 Sacrococcygeal disorders, not elsewhere classified: Secondary | ICD-10-CM

## 2024-06-02 DIAGNOSIS — M9901 Segmental and somatic dysfunction of cervical region: Secondary | ICD-10-CM | POA: Diagnosis not present

## 2024-06-02 DIAGNOSIS — M9902 Segmental and somatic dysfunction of thoracic region: Secondary | ICD-10-CM | POA: Diagnosis not present

## 2024-06-02 DIAGNOSIS — M9903 Segmental and somatic dysfunction of lumbar region: Secondary | ICD-10-CM

## 2024-06-02 NOTE — Patient Instructions (Signed)
 Good to see you. Keep working on leveling out pedal stroke. See me again in 2 months.

## 2024-06-03 ENCOUNTER — Encounter: Payer: Self-pay | Admitting: Family Medicine

## 2024-06-03 NOTE — Assessment & Plan Note (Signed)
 Discussed HEP  Discussed which activities to do and which ones to avoid.  Patient has responded well though to be exercises.  Undergoing the potential treatment by EP for PACs.  Will continue to monitor.

## 2024-06-12 ENCOUNTER — Other Ambulatory Visit: Payer: Self-pay | Admitting: Family Medicine

## 2024-06-22 ENCOUNTER — Encounter: Payer: Self-pay | Admitting: Cardiology

## 2024-06-22 ENCOUNTER — Ambulatory Visit: Attending: Cardiovascular Disease | Admitting: Cardiology

## 2024-06-22 ENCOUNTER — Ambulatory Visit: Payer: Medicare Other | Admitting: Dermatology

## 2024-06-22 VITALS — BP 121/68 | HR 77 | Ht 71.0 in | Wt 164.1 lb

## 2024-06-22 DIAGNOSIS — I455 Other specified heart block: Secondary | ICD-10-CM | POA: Diagnosis not present

## 2024-06-22 DIAGNOSIS — R002 Palpitations: Secondary | ICD-10-CM | POA: Insufficient documentation

## 2024-06-22 DIAGNOSIS — I2510 Atherosclerotic heart disease of native coronary artery without angina pectoris: Secondary | ICD-10-CM | POA: Insufficient documentation

## 2024-06-22 DIAGNOSIS — E785 Hyperlipidemia, unspecified: Secondary | ICD-10-CM | POA: Diagnosis not present

## 2024-06-22 DIAGNOSIS — I491 Atrial premature depolarization: Secondary | ICD-10-CM | POA: Diagnosis not present

## 2024-06-22 NOTE — Progress Notes (Signed)
 Electrophysiology Office Note:   Date:  06/23/2024  ID:  Paul [name], DOB 1951/01/13, MRN [id number]  Primary Cardiologist: Oneil Parchment, MD Electrophysiologist: Fonda Kitty, MD      History of Present Illness:   Paul [name] is a 73 y.o. male with h/o CAD status post PCI, hyperlipidemia who is being seen today for palpitations and PACs.  Discussed the use of AI scribe software for clinical note transcription with the patient, who gave verbal consent to proceed.  History of Present Illness Paul [name] is a 73 year old male with a history of heart attack who presents with heart flutters. He was referred by Dr. Parchment for evaluation.  He experiences palpitations, particularly noticeable when resting or recovering after training. The palpitations are usually suppressed with activity.  He initially consulted with his sports doctor, who recommended further evaluation. Subsequently, he was referred to Dr. Parchment, who placed him on a heart monitor. The monitor showed some PACs and a pause, prompting the referral to an electrophysiologist.  He is an endurance athlete, training five days a week, sometimes twice a day, for triathlons. While wearing the monitor, he reduced his training intensity, but the flutters persisted.  He has a history of nerve pain for which he was taking gabapentin  twice a day. He stopped the medication but resumed it a week ago due to recurring pain. He observed that the heart flutters seemed to coincide with the resumption of gabapentin , although he is unsure of the connection.  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 His wife has experienced atrial fibrillation, which adds to his concern.  He checks his heart rate whenever he experiences palpitations and notes that it is not elevated during these episodes. No device alerts with regards to atrial fibrillation,  but he is considering options to monitor his heart rhythm more closely.   Review of systems complete and found to be negative unless listed in HPI.   EP Information / Studies Reviewed:    EKG is ordered today. Personal review as below.  EKG Interpretation Date/Time:  Tuesday June 22 2024 15:59:03 EDT Ventricular Rate:  75 PR Interval:  188 QRS Duration:  94 QT Interval:  402 QTC Calculation: 448 R Axis:   70  Text Interpretation: Normal sinus rhythm Right atrial enlargement When compared with ECG of 12-May-2023 14:19, Nonspecific T wave abnormality no longer evident in Inferior leads Confirmed by Kitty Fonda [phone]) on 06/22/2024 4:25:49 PM   Zio 05/20/24: Patch Wear Time:  13 days and 19 hours (2025-06-09T15:53:11-398 to 2025-06-23T11:41:37-0400)   Patient had a min HR of 42 bpm, max HR of 164 bpm, and avg HR of 67 bpm. Predominant underlying rhythm was Sinus Rhythm. 1 run of Supraventricular Tachycardia occurred lasting 8 beats with a max rate of 110 bpm (avg 100 bpm). 2 Pauses occurred, the  longest lasting 4.7 secs (13 bpm). Isolated SVEs were frequent (7.7%, 100112), SVE Couplets were rare (<1.0%, 315), and SVE Triplets were rare (<1.0%, 41). Isolated VEs were rare (<1.0%, 240), VE Couplets were rare (<1.0%, 7), and VE Triplets were rare  (<1.0%, 2).  LHC 04/2023:  Severe 2 Vessel CAD of LAD & RCA with moderate Cx/RI disease prox RCA CTO (100%) with L-R collaterals filling rPDA & RVM (Med Rx LAD prox-mid LAD ~70% at 1st Sep & 1st Diag takeoff --> RFR 0.91, FFR 0.78, distal LAD hazy eccentric 80%  =>  FFR-guided DES PCI of p-mLAD reducing 70-% to 0% with stable ~60% ostial 1st Diag & 40% ost 1st Sep (Synergy XD 3.0 x 12 -> tapered 3.6-3.3 mm). TIMI 3 flow pre & post difficult distal LAD DES PCI reducing 80% to 0% (Synergy XD 2.5 x 16 - deployed to 2.7 mm). TIMI 3 flow pre & post with brisk L-RPDA collaterals (difficult due to distal lesion in tortous vesel - required buddy wire &  multiple balloon inflations.  Normal LVEDP & normal EF by ECho   Echo 04/2023:   1. Left ventricular ejection fraction, by estimation, is 50 to 55%. Left  ventricular ejection fraction by 2D MOD biplane is 53.4 %. The left  ventricle has low normal function. The left ventricle has no regional wall  motion abnormalities. There is  moderate left ventricular hypertrophy. Left ventricular diastolic  parameters are consistent with Grade I diastolic dysfunction (impaired  relaxation).   2. Right ventricular systolic function is mildly reduced. The right  ventricular size is normal. Tricuspid regurgitation signal is inadequate  for assessing PA pressure.   3. Right atrial size was mildly dilated.   4. The mitral valve is abnormal. Trivial mitral valve regurgitation.   5. The aortic valve is tricuspid. Aortic valve regurgitation is trivial.  Aortic valve sclerosis/calcification is present, without any evidence of  aortic stenosis. Aortic valve mean gradient measures 8.0 mmHg.   6. Aortic dilatation noted. There is borderline dilatation of the aortic  root, measuring 39 mm.   7. The inferior vena cava is normal in size with greater than 50%  respiratory variability, suggesting right atrial pressure of 3 mmHg.       Physical Exam:   VS:  BP 121/68   Pulse 77   Ht 5' 11 (1.803 m)   Wt 164 lb 1.6 oz (74.4 kg)   SpO2 98%   BMI 22.89 kg/m    Wt Readings from Last 3 Encounters:  06/22/24 164 lb 1.6 oz (74.4 kg)  06/02/24 163 lb (73.9 kg)  04/21/24 165 lb (74.8 kg)     GEN: Well nourished, well developed in no acute distress NECK: No JVD CARDIAC: Normal rate, regular RESPIRATORY:  Clear to auscultation without rales, wheezing or rhonchi  ABDOMEN: Soft, non-distended EXTREMITIES:  No edema; No deformity   ASSESSMENT AND PLAN:    #. Palpitations: Likely secondary to PACs. #. PACs: 7.7% burden.  - Patient denies any severe symptoms related to his PACs.  He feels that his primary issue  is probably the anxiety that the palpitations evoke, primarily because of his history of heart attack.  After reassuring him that PACs can be benign, he thought this would result in significant improvement.  More importantly, he is a Product/process development scientist.  Any medications to suppress his PACs may result in fatigue and certainly more bradycardia, which could impair his athletic performance.  At this time, I do not feel benefits associated with medical suppression of his PACs would outweigh potential side effects.  Patient agrees with this assessment.  We did discuss that he is at higher risk for atrial fibrillation given his long-term history as an endurance athlete and high prevalence of PACs.  He acknowledged that he already understood he was at increased risk for atrial fibrillation and will continue with aggressive monitoring.  He wears a watch constantly tracking his heart rate and rhythm.  #.  Sinus pauses: Seen on Zio.  I suspect these were sinus arrhythmia vs vagally mediated, although not obviously so  on the available tracings.  Likely high vagal tone as an endurance athlete.  There were no associated symptoms.  No evidence of AV block.  Continue to monitor.  #. CAD s/p PCI: Denies chest pain. #. HLD -Continue aspirin  81 mg daily, Crestor  20 mg daily, isosorbide  30 mg daily.   Follow up with Dr. Kennyth as needed.   Signed, Fonda Kennyth, MD

## 2024-06-22 NOTE — Patient Instructions (Signed)

## 2024-06-23 ENCOUNTER — Encounter: Payer: Self-pay | Admitting: Family Medicine

## 2024-06-24 ENCOUNTER — Ambulatory Visit (INDEPENDENT_AMBULATORY_CARE_PROVIDER_SITE_OTHER): Admitting: Dermatology

## 2024-06-24 DIAGNOSIS — L578 Other skin changes due to chronic exposure to nonionizing radiation: Secondary | ICD-10-CM | POA: Diagnosis not present

## 2024-06-24 DIAGNOSIS — L570 Actinic keratosis: Secondary | ICD-10-CM

## 2024-06-24 DIAGNOSIS — W908XXA Exposure to other nonionizing radiation, initial encounter: Secondary | ICD-10-CM | POA: Diagnosis not present

## 2024-06-24 NOTE — Patient Instructions (Addendum)

## 2024-06-24 NOTE — Progress Notes (Signed)
   Follow-Up Visit   Subjective  Paul [name] is a 73 y.o. male who presents for the following: Actinic keratosis follow-up. There is residual area on the left ear helix. The patient has spots, moles and lesions to be evaluated, some may be new or changing.   The following portions of the chart were reviewed this encounter and updated as appropriate: medications, allergies, medical history  Review of Systems:  No other skin or systemic complaints except as noted in HPI or Assessment and Plan.  Objective  Well appearing patient in no apparent distress; mood and affect are within normal limits.  A focused examination was performed of the following areas: face  Relevant exam findings are noted in the Assessment and Plan.  L antihelix x 1, L mid ear helix at rim x 1 (residual), L lat cheek x 1 (3) L sup ear helix rim with 5mm keratotic macule; pink scaly macules at left ear antihelix, L lat cheek  Assessment & Plan   AK (ACTINIC KERATOSIS) (3) L antihelix x 1, L mid ear helix at rim x 1 (residual), L lat cheek x 1 (3) If left ear doesn't clear with second treatment cryotherapy, patient to RTC.   Actinic keratoses are precancerous spots that appear secondary to cumulative UV radiation exposure/sun exposure over time. They are chronic with expected duration over 1 year. A portion of actinic keratoses will progress to squamous cell carcinoma of the skin. It is not possible to reliably predict which spots will progress to skin cancer and so treatment is recommended to prevent development of skin cancer.  Recommend daily broad spectrum sunscreen SPF 30+ to sun-exposed areas, reapply every 2 hours as needed.  Recommend staying in the shade or wearing long sleeves, sun glasses (UVA+UVB protection) and wide brim hats (4-inch brim around the entire circumference of the hat). Call for new or changing lesions. Destruction of lesion - L antihelix x 1, L mid ear helix at rim x 1 (residual), L lat  cheek x 1 (3)  Destruction method: cryotherapy   Informed consent: discussed and consent obtained   Lesion destroyed using liquid nitrogen: Yes   Region frozen until ice ball extended beyond lesion: Yes   Outcome: patient tolerated procedure well with no complications   Post-procedure details: wound care instructions given   Additional details:  Prior to procedure, discussed risks of blister formation, small wound, skin dyspigmentation, or rare scar following cryotherapy. Recommend Vaseline ointment to treated areas while healing.    ACTINIC DAMAGE - chronic, secondary to cumulative UV radiation exposure/sun exposure over time - diffuse scaly erythematous macules with underlying dyspigmentation - Recommend daily broad spectrum sunscreen SPF 30+ to sun-exposed areas, reapply every 2 hours as needed.  - Recommend staying in the shade or wearing long sleeves, sun glasses (UVA+UVB protection) and wide brim hats (4-inch brim around the entire circumference of the hat). - Call for new or changing lesions.    Return in about 6 months (around 12/25/2024) for AKs.  IAndrea Kerns, CMA, am acting as scribe for Rexene Rattler, MD .   Documentation: I have reviewed the above documentation for accuracy and completeness, and I agree with the above.  Rexene Rattler, MD

## 2024-06-25 ENCOUNTER — Ambulatory Visit (INDEPENDENT_AMBULATORY_CARE_PROVIDER_SITE_OTHER): Admitting: Family Medicine

## 2024-06-25 ENCOUNTER — Encounter: Payer: Self-pay | Admitting: Family Medicine

## 2024-06-25 VITALS — BP 132/82 | HR 61 | Ht 71.0 in

## 2024-06-25 DIAGNOSIS — M533 Sacrococcygeal disorders, not elsewhere classified: Secondary | ICD-10-CM

## 2024-06-25 DIAGNOSIS — M9904 Segmental and somatic dysfunction of sacral region: Secondary | ICD-10-CM | POA: Diagnosis not present

## 2024-06-25 DIAGNOSIS — M9902 Segmental and somatic dysfunction of thoracic region: Secondary | ICD-10-CM | POA: Diagnosis not present

## 2024-06-25 DIAGNOSIS — M9901 Segmental and somatic dysfunction of cervical region: Secondary | ICD-10-CM | POA: Diagnosis not present

## 2024-06-25 DIAGNOSIS — M9903 Segmental and somatic dysfunction of lumbar region: Secondary | ICD-10-CM

## 2024-06-25 DIAGNOSIS — M1712 Unilateral primary osteoarthritis, left knee: Secondary | ICD-10-CM

## 2024-06-25 DIAGNOSIS — M5030 Other cervical disc degeneration, unspecified cervical region: Secondary | ICD-10-CM | POA: Diagnosis not present

## 2024-06-25 DIAGNOSIS — S86111D Strain of other muscle(s) and tendon(s) of posterior muscle group at lower leg level, right leg, subsequent encounter: Secondary | ICD-10-CM

## 2024-06-25 DIAGNOSIS — M9908 Segmental and somatic dysfunction of rib cage: Secondary | ICD-10-CM

## 2024-06-25 NOTE — Assessment & Plan Note (Signed)
 Chronic problem with exacerbation.  Discussed icing regimen and home exercises.  Continue to monitor.  Spine very well though to osteopathic manipulation.  Continue core strengthening exercises.  Follow-up again in 6 to 8 weeks

## 2024-06-25 NOTE — Patient Instructions (Signed)
 Injec ted knee  Stay active start running Wednesday  Get brace fitted  See me again in September

## 2024-06-25 NOTE — Progress Notes (Addendum)
 [facility] [address] [place] [address] Phone: [phone] Subjective:    I'm seeing this patient by the request  of:  [name], MD  CC: Left knee pain, neck pain, back pain  YEP:Paul [name]  Paul [name] is a 73 y.o. male coming in with complaint of back and neck pain. OMT 06/02/2024. Patient states that he has some pain over medial aspect of L knee. Swelling occurs in knee after running. Continues to have pain in the lateral aspect of L knee.   Also has some pain in the R deltoid with swimming. No longer using gabapentin  which was helpful. It also seemed to help him with heart PACs.   Pain in R groin with external rotation of his hip. Pain can be sharp and feels weak. No pain with activity.   Medications patient has been prescribed: Cymbalta , Vit D  Taking:         Reviewed prior external information including notes and imaging from previsou exam, outside providers and external EMR if available.   As well as notes that were available from care everywhere and other healthcare systems.  Past medical history, social, surgical and family history all reviewed in electronic medical record.  No pertanent information unless stated regarding to the chief complaint.   Past Medical History:  Diagnosis Date   Actinic keratosis    Cataract    Depression    RESOLVED   Hyperlipidemia    Spinal stenosis    WAS HAVING NECK PAIN -PT STATES NERVE ABLATION PROCEDURE 2012 AND PAIN HAS RESOLVED   Spondylitis (HCC)     Allergies  Allergen Reactions   Crestor  [Rosuvastatin ] Other (See Comments)    Myalgias  Pt currently taking 20mg  QD   Lipitor [Atorvastatin] Other (See Comments)    Liver toxicity     Review of Systems:  No headache, visual changes, nausea, vomiting, diarrhea, constipation, dizziness, abdominal pain, skin rash, fevers, chills, night sweats, weight loss, swollen lymph nodes, body aches, joint swelling, chest pain,  shortness of breath, mood changes. POSITIVE muscle aches  Objective  Blood pressure 132/82, pulse 61, height 5' 11 (1.803 m), SpO2 97%.   General: No apparent distress alert and oriented x3 mood and affect normal, dressed appropriately.  HEENT: Pupils equal, extraocular movements intact  Respiratory: Patient's speak in full sentences and does not appear short of breath  Cardiovascular: No lower extremity edema, non tender, no erythema  Gait MSK:  Back does have some loss lordosis noted.  Some tenderness to palpation in the paraspinal musculature.  Neck has very minimal extension and sidebending.  Negative Spurling's though today.  Left knee does have an effusion noted.  Moderate in nature of the left knee.  Limited muscular skeletal ultrasound was performed and interpreted by CLAUDENE HUSSAR, M  Limited send shows that there is a moderate effusion noted of the patellofemoral joint.  Patient does have moderate to severe narrowing of the lateral compartment and in the patellofemoral with mild to moderate on the medial joint space.  After informed written and verbal consent, patient was seated on exam table. Left knee was prepped with alcohol swab and utilizing anterolateral approach, patient's left knee space was injected with 4:1  marcaine  0.5%: Kenalog  40mg /dL. Patient tolerated the procedure well without immediate complications.  Osteopathic findings  C2 flexed rotated and side bent right C6 flexed rotated and side bent left T3 extended rotated and side bent right inhaled rib T9 extended rotated and  side bent left L2 flexed rotated and side bent right Sacrum right on right     Assessment and Plan:  Degenerative arthritis of left knee Repeat injection given today.  Discussed icing regimen and home exercises, patient is getting his custom brace refitted.  Do think that this will be beneficial.  Discussed doing more endurance activities with less impact follow-up with me again in 2  months  SI (sacroiliac) joint dysfunction Chronic problem with exacerbation.  Discussed icing regimen and home exercises.  Continue to monitor.  Spine very well though to osteopathic manipulation.  Continue core strengthening exercises.  Follow-up again in 6 to 8 weeks  Strain of right soleus muscle Make an improvement.  Working with a massage therapist  Degenerative disc disease, cervical Severe overall but has been doing relatively well, considering a possible another epidural.  Muscle energy techniques today.    Nonallopathic problems  Decision today to treat with OMT was based on Physical Exam  After verbal consent patient was treated with HVLA, ME, FPR techniques in cervical, rib, thoracic, lumbar, and sacral  areas avoided HVLA on the cervical spine  Patient tolerated the procedure well with improvement in symptoms  Patient given exercises, stretches and lifestyle modifications  See medications in patient instructions if given  Patient will follow up in 4-8 weeks     The above documentation has been reviewed and is accurate and complete Sharla Tankard M Owin Vignola, DO         Note: This dictation was prepared with Dragon dictation along with smaller phrase technology. Any transcriptional errors that result from this process are unintentional.

## 2024-06-25 NOTE — Assessment & Plan Note (Signed)
 Severe overall but has been doing relatively well, considering a possible another epidural.  Muscle energy techniques today.

## 2024-06-25 NOTE — Assessment & Plan Note (Signed)
 Repeat injection given today.  Discussed icing regimen and home exercises, patient is getting his custom brace refitted.  Do think that this will be beneficial.  Discussed doing more endurance activities with less impact follow-up with me again in 2 months

## 2024-06-25 NOTE — Assessment & Plan Note (Signed)
 Make an improvement.  Working with a Teacher, adult education

## 2024-07-19 ENCOUNTER — Other Ambulatory Visit: Payer: Self-pay | Admitting: Medical Genetics

## 2024-07-22 NOTE — Progress Notes (Unsigned)
 [name] [address] [place] [address] Phone: [phone] Subjective:   LILLETTE Berwyn [name], am serving as a scribe for Dr. Arthea [name].  I'm seeing this patient by the request  of:  Paul Elsie SAUNDERS, MD  CC: Multiple joint pain  YEP:Dlagzrupcz  Paul [name] is a 73 y.o. male coming in with complaint of back and neck pain. OMT 06/25/2024.  Given injection in the knee at last exam.  Patient states that his R gastroc is sore. Has stretched, massaged and dry needled this area. Running increases his pain.   Injection helped last visit for L leg but he twisted it last week and his pain has increased.   Medications patient has been prescribed: Cymbalta , Vit D  Taking:         Reviewed prior external information including notes and imaging from previsou exam, outside providers and external EMR if available.   As well as notes that were available from care everywhere and other healthcare systems.  Past medical history, social, surgical and family history all reviewed in electronic medical record.  No pertanent information unless stated regarding to the chief complaint.   Past Medical History:  Diagnosis Date   Actinic keratosis    Cataract    Depression    RESOLVED   Hyperlipidemia    Spinal stenosis    WAS HAVING NECK PAIN -PT STATES NERVE ABLATION PROCEDURE 2012 AND PAIN HAS RESOLVED   Spondylitis (HCC)     Allergies  Allergen Reactions   Crestor  [Rosuvastatin ] Other (See Comments)    Myalgias  Pt currently taking 20mg  QD   Lipitor [Atorvastatin] Other (See Comments)    Liver toxicity     Review of Systems:  No headache, visual changes, nausea, vomiting, diarrhea, constipation, dizziness, abdominal pain, skin rash, fevers, chills, night sweats, weight loss, swollen lymph nodes, body aches, joint swelling, chest pain, shortness of breath, mood changes. POSITIVE muscle aches  Objective  Blood pressure 112/70, pulse 74,  height 5' 11 (1.803 m), weight 158 lb (71.7 kg), SpO2 94%.   General: No apparent distress alert and oriented x3 mood and affect normal, dressed appropriately.  HEENT: Pupils equal, extraocular movements intact  Respiratory: Patient's speak in full sentences and does not appear short of breath  Cardiovascular: No lower extremity edema, non tender, no erythema  Gait relatively normal.  Does have a varus deformity noted of the left knee. MSK:  Back mild loss of lordosis.  Tightness in the neck noted.  Significant decrease in range of motion in the neck with bilateral sidebending and rotation.  Only 5 degrees of extension  Right calf tender to palpation right between the medial and lateral gastroc head.  No pain in the Achilles.  Good range of motion of the ankle.  Worsening pain with resisted dorsiflexion and any other movement.  Deep tendon reflexes are intact.  Osteopathic findings C5 flexed rotated and side bent left C6 flexed rotated and side bent left T3 extended rotated and side bent right inhaled rib T9 extended rotated and side bent left L2 flexed rotated and side bent right Sacrum right on right     Assessment and Plan:  Degenerative disc disease, cervical Severe overall, and constant tightness.  Working by positioning.  Discussed icing regimen and home exercises, increase activity slowly otherwise.  Increase activity.  Follow-up again in 6 to 8 weeks    Nonallopathic problems  Decision today to treat with OMT was based on Physical  Exam  After verbal consent patient was treated with HVLA, ME, FPR techniques in cervical, rib, thoracic, lumbar, and sacral  areas muscle energy in the neck  Patient tolerated the procedure well with improvement in symptoms  Patient given exercises, stretches and lifestyle modifications  See medications in patient instructions if given  Patient will follow up in 4-8 weeks    The above documentation has been reviewed and is accurate and  complete Paul [name] M Paul Funaro, DO          Note: This dictation was prepared with Dragon dictation along with smaller phrase technology. Any transcriptional errors that result from this process are unintentional.

## 2024-07-23 ENCOUNTER — Ambulatory Visit: Admitting: Family Medicine

## 2024-07-23 ENCOUNTER — Encounter: Payer: Self-pay | Admitting: Family Medicine

## 2024-07-23 VITALS — BP 112/70 | HR 74 | Ht 71.0 in | Wt 158.0 lb

## 2024-07-23 DIAGNOSIS — M9901 Segmental and somatic dysfunction of cervical region: Secondary | ICD-10-CM | POA: Diagnosis not present

## 2024-07-23 DIAGNOSIS — S86111D Strain of other muscle(s) and tendon(s) of posterior muscle group at lower leg level, right leg, subsequent encounter: Secondary | ICD-10-CM

## 2024-07-23 DIAGNOSIS — M9903 Segmental and somatic dysfunction of lumbar region: Secondary | ICD-10-CM

## 2024-07-23 DIAGNOSIS — M5030 Other cervical disc degeneration, unspecified cervical region: Secondary | ICD-10-CM | POA: Diagnosis not present

## 2024-07-23 DIAGNOSIS — M9902 Segmental and somatic dysfunction of thoracic region: Secondary | ICD-10-CM

## 2024-07-23 DIAGNOSIS — M9904 Segmental and somatic dysfunction of sacral region: Secondary | ICD-10-CM | POA: Diagnosis not present

## 2024-07-23 DIAGNOSIS — M79662 Pain in left lower leg: Secondary | ICD-10-CM

## 2024-07-23 DIAGNOSIS — M9908 Segmental and somatic dysfunction of rib cage: Secondary | ICD-10-CM

## 2024-07-23 DIAGNOSIS — M79661 Pain in right lower leg: Secondary | ICD-10-CM

## 2024-07-23 MED ORDER — TIZANIDINE HCL 2 MG PO TABS: 2.0000 mg | ORAL_TABLET | Freq: Every day | ORAL | 0 refills | Status: AC

## 2024-07-23 NOTE — Patient Instructions (Signed)
 Try alter g Referral to [facility] See me in 6 weeks

## 2024-07-23 NOTE — Assessment & Plan Note (Signed)
 Severe overall, and constant tightness.  Working by positioning.  Discussed icing regimen and home exercises, increase activity slowly otherwise.  Increase activity.  Follow-up again in 6 to 8 weeks

## 2024-07-23 NOTE — Assessment & Plan Note (Signed)
 Has had this intermittently.  Do think it is more of the soleus again.  Given heel lift, discussed compression, discussed icing regimen.  Increase activity slowly otherwise.  Follow-up again in 6 to 8 weeks.  Discussed heel lifts in regular shoes.

## 2024-08-03 ENCOUNTER — Other Ambulatory Visit

## 2024-08-09 DIAGNOSIS — S86819S Strain of other muscle(s) and tendon(s) at lower leg level, unspecified leg, sequela: Secondary | ICD-10-CM | POA: Diagnosis not present

## 2024-08-09 DIAGNOSIS — S86819D Strain of other muscle(s) and tendon(s) at lower leg level, unspecified leg, subsequent encounter: Secondary | ICD-10-CM | POA: Diagnosis not present

## 2024-08-11 DIAGNOSIS — S86819D Strain of other muscle(s) and tendon(s) at lower leg level, unspecified leg, subsequent encounter: Secondary | ICD-10-CM | POA: Diagnosis not present

## 2024-08-11 DIAGNOSIS — S86819S Strain of other muscle(s) and tendon(s) at lower leg level, unspecified leg, sequela: Secondary | ICD-10-CM | POA: Diagnosis not present

## 2024-08-19 ENCOUNTER — Ambulatory Visit: Attending: Cardiology | Admitting: Cardiology

## 2024-08-19 ENCOUNTER — Encounter: Payer: Self-pay | Admitting: Cardiology

## 2024-08-19 VITALS — BP 122/62 | HR 60 | Resp 16 | Ht 71.0 in | Wt 165.8 lb

## 2024-08-19 DIAGNOSIS — F4321 Adjustment disorder with depressed mood: Secondary | ICD-10-CM | POA: Insufficient documentation

## 2024-08-19 DIAGNOSIS — Z634 Disappearance and death of family member: Secondary | ICD-10-CM | POA: Diagnosis not present

## 2024-08-19 DIAGNOSIS — I491 Atrial premature depolarization: Secondary | ICD-10-CM | POA: Insufficient documentation

## 2024-08-19 DIAGNOSIS — I2510 Atherosclerotic heart disease of native coronary artery without angina pectoris: Secondary | ICD-10-CM | POA: Insufficient documentation

## 2024-08-19 DIAGNOSIS — I455 Other specified heart block: Secondary | ICD-10-CM | POA: Diagnosis not present

## 2024-08-19 MED ORDER — ROSUVASTATIN CALCIUM 20 MG PO TABS: 20.0000 mg | ORAL_TABLET | Freq: Every day | ORAL | 3 refills | Status: AC

## 2024-08-19 MED ORDER — PRASUGREL HCL 10 MG PO TABS
10.0000 mg | ORAL_TABLET | Freq: Every day | ORAL | 3 refills | Status: AC
  Filled 2024-08-27: qty 90, 90d supply, fill #0

## 2024-08-19 MED ORDER — ISOSORBIDE MONONITRATE ER 30 MG PO TB24: 30.0000 mg | ORAL_TABLET | Freq: Every day | ORAL | 3 refills | Status: DC

## 2024-08-19 NOTE — Progress Notes (Signed)
 Cardiology Office Note:  .   Date:  08/19/2024  ID:  Paul [name], DOB 1951-06-07, MRN [id number] PCP: [name], MD  [facility] Providers Cardiologist:  Oneil Parchment, MD Electrophysiologist:  Fonda Kitty, MD     History of Present Illness: Paul [name]   Paul [name] is a 73 y.o. male Discussed the use of AI scribe software for clinical note transcription with the patient, who gave verbal consent to proceed.  History of Present Illness Paul [name] is a 73 year old male with coronary artery disease and hyperlipidemia who presents for follow-up regarding palpitations and premature atrial contractions (PACs). He was seen by Dr. Kitty for evaluation of palpitations and PACs.  He has a history of coronary artery disease, status post percutaneous coronary intervention (PCI) with drug-eluting stents placed in the proximal to mid left anterior descending (LAD) artery and distal LAD in June 2024. He is currently on Crestor  20 mg, with his last LDL recorded at 86 mg/dL in July 7974.  He was evaluated by electrophysiology for palpitations and premature atrial contractions (PACs) after experiencing a pause on the monitor. 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 His wife has experienced atrial fibrillation.  His EKG on June 22, 2024, showed a ventricular rate of 75 beats per minute with right atrial enlargement. A Zio monitor on May 20, 2024, showed frequent PACs at 7.7%. He has not experienced palpitations during recent races, including a half Ironman in [place].  He is currently on aspirin  81 mg, Crestor  20 mg, and Isosorbide  30 mg. He has started taking Ketone IQ for recovery, which he uses for endurance training. No chest pain or problems were reported during the Ironman.  His daughter, Paul [name] (patient of mine as well) recently passed away two weeks ago, which has  been a source of significant stress. He is planning to attend grief counseling.      Studies Reviewed: .        Results LABS LDL: 86 (05/2024)  DIAGNOSTIC EKG: Ventricular rate 75 bpm, right atrial enlargement (06/22/2024) Zio Patch: Frequent premature atrial contractions (PACs), 7.7% (05/20/2024) Left heart catheterization: Drug-eluting stent in proximal to mid LAD and distal LAD (04/20/2023) Echocardiogram: Ejection fraction (EF) 50-55%, aorta 39 mm Risk Assessment/Calculations:            Physical Exam:   VS:  BP 122/62 (BP Location: Left Arm, Patient Position: Sitting, Cuff Size: Normal)   Pulse 60   Resp 16   Ht 5' 11 (1.803 m)   Wt 165 lb 12.8 oz (75.2 kg)   SpO2 94%   BMI 23.12 kg/m    Wt Readings from Last 3 Encounters:  08/19/24 165 lb 12.8 oz (75.2 kg)  07/23/24 158 lb (71.7 kg)  06/22/24 164 lb 1.6 oz (74.4 kg)    GEN: Well nourished, well developed in no acute distress NECK: No JVD; No carotid bruits CARDIAC: RRR, no murmurs, no rubs, no gallops RESPIRATORY:  Clear to auscultation without rales, wheezing or rhonchi  ABDOMEN: Soft, non-tender, non-distended EXTREMITIES:  No edema; No deformity   ASSESSMENT AND PLAN: .    Assessment and Plan Assessment & Plan Coronary artery disease, status post PCI with drug-eluting stent Coronary artery disease is well-managed post-PCI with drug-eluting stent placement in the proximal to mid LAD and distal LAD. He reports no recent chest pain or issues during physical exertion, including  a half Ironman. Blood pressure is well-controlled, and weight is stable at 165 lbs. - Discontinue aspirin  to reduce bleeding risk, supported by data for stable patients with stents. - Continue prasugrel  monotherapy. - Continue isosorbide .  Palpitations and frequent premature atrial contractions (PACs) Palpitations and frequent PACs were previously evaluated by electrophysiology. Recent Zio monitor showed frequent PACs (7.7%). Pauses  on the monitor were suspected to be sinus arrhythmia or vagally mediated due to high vagal tone, with no evidence of AV block. Symptoms have improved, with no recent palpitations during a half Ironman. Anxiety may contribute to symptoms. - Continue observation as advised by electrophysiology.  Hyperlipidemia Hyperlipidemia is managed with Crestor  20 mg. LDL was 86 mg/dL in July, which is higher than desired. Recent dietary changes led to weight gain, but efforts are being made to return to a healthier diet. - Continue Crestor  20 mg daily. - Reassess lipid panel at next visit.         Dispo: 1 yr  Signed, Oneil Parchment, MD

## 2024-08-19 NOTE — Patient Instructions (Signed)
 Medication Instructions:  Please discontinue your ASA. Continue all other medications as listed.  *If you need a refill on your cardiac medications before your next appointment, please call your pharmacy*  Follow-Up: At [facility], you and your health needs are our priority.  As part of our continuing mission to provide you with exceptional heart care, our providers are all part of one team.  This team includes your primary Cardiologist (physician) and Advanced Practice Providers or APPs (Physician Assistants and Nurse Practitioners) who all work together to provide you with the care you need, when you need it.  Your next appointment:   1 year(s)  Provider:   Oneil Parchment, MD    We recommend signing up for the patient portal called MyChart.  Sign up information is provided on this After Visit Summary.  MyChart is used to connect with patients for Virtual Visits (Telemedicine).  Patients are able to view lab/test results, encounter notes, upcoming appointments, etc.  Non-urgent messages can be sent to your provider as well.   To learn more about what you can do with MyChart, go to [email or web address].

## 2024-08-20 NOTE — Progress Notes (Signed)
 [facility] [address] [place] [address] Phone: [phone] Subjective:   Paul [name], am serving as a scribe for Dr. [name].  I'm seeing this patient by the request  of:  [name], MD  CC: Back and neck pain follow-up  Paul [name]  Paul [name] is a 73 y.o. male coming in with complaint of back and neck pain. OMT 07/23/2024. Also f/u for B calf pain. Patient states that he raced a few weeks ago and he had pain in R calf. Dry needling was helpful. He ran 12 min miles instead of 9 min miles. Next race is Nov 9th.   Continues to have pain in L calf and lateral knee. Pain has not gone away.   Medications patient has been prescribed: Cymbalta   Taking:         Reviewed prior external information including notes and imaging from previsou exam, outside providers and external EMR if available.   As well as notes that were available from care everywhere and other healthcare systems.  Past medical history, social, surgical and family history all reviewed in electronic medical record.  No pertanent information unless stated regarding to the chief complaint.   Past Medical History:  Diagnosis Date   Actinic keratosis    Cataract    Depression    RESOLVED   Hyperlipidemia    Spinal stenosis    WAS HAVING NECK PAIN -PT STATES NERVE ABLATION PROCEDURE 2012 AND PAIN HAS RESOLVED   Spondylitis     Allergies  Allergen Reactions   Crestor  [Rosuvastatin ] Other (See Comments)    Myalgias  Pt currently taking 20mg  QD   Lipitor [Atorvastatin] Other (See Comments)    Liver toxicity     Review of Systems:  No headache, visual changes, nausea, vomiting, diarrhea, constipation, dizziness, abdominal pain, skin rash, fevers, chills, night sweats, weight loss, swollen lymph nodes, body aches, joint swelling, chest pain, shortness of breath, mood changes. POSITIVE muscle aches  Objective  Blood pressure 112/78, pulse 68,  height 5' 11 (1.803 m), weight 165 lb (74.8 kg), SpO2 95%.   General: No apparent distress alert and oriented x3 mood and affect normal, dressed appropriately.  HEENT: Pupils equal, extraocular movements intact  Respiratory: Patient's speak in full sentences and does not appear short of breath  Cardiovascular: No lower extremity edema, non tender, no erythema  Gait MSK:  Back mild loss of lordosis.  Significant tightness noted in the paraspinal musculature of the lumbar spine.  Patient is severely tender over the greater trochanteric area on the right side.  Patient also knees bilaterally do have significant amount of arthritic changes noted.  Limited muscular skeletal ultrasound was performed and interpreted by CLAUDENE ARTHEA, M  Limited ultrasound of patient's knee shows arthritic changes noted bilaterally.  Patient's right gastrocnemius and soleus muscles appear to be unremarkable now.  Large varicose veins are noted but if they are completely compressible at the moment.   After verbal consent patient was prepped with alcohol swab and with a 21-gauge 2 inch needle injected into the right greater trochanteric area with 2 cc of 0.5% Marcaine  and 1 cc of Kenalog  40 mg/mL.  No blood loss.  Band-Aid placed.  Postinjection instructions given  After informed written and verbal consent, patient was seated on exam table. Right knee was prepped with alcohol swab and utilizing anterolateral approach, patient's right knee space was injected with 4:1  marcaine  0.5%: Kenalog  40mg /dL. Patient tolerated the procedure  well without immediate complications.  After informed written and verbal consent, patient was seated on exam table. Left knee was prepped with alcohol swab and utilizing anterolateral approach, patient's left knee space was injected with 4:1  marcaine  0.5%: Kenalog  40mg /dL. Patient tolerated the procedure well without immediate complications.       Assessment and Plan:  Degenerative  arthritis of knee, bilateral Bilateral injections given.  Still being very fit.  Is running a significant amount as well as doing a triathlon at the end of November.  I do think patient will do extremely well and and we will give the injection.  Discussed with training still consider more nonweightbearing such as swimming and biking would be beneficial more than the running.  Greater trochanteric bursitis of right hip New problem, has had on the contralateral side, think it is secondary to compensation for the knee on this side.  Discussed icing regimen and home exercises, increase activity slowly.  Discussed icing regimen.  Follow-up again in 6 to 8 weeks       The above documentation has been reviewed and is accurate and complete Paul Hopping M Jacqeline Broers, DO         Note: This dictation was prepared with Dragon dictation along with smaller phrase technology. Any transcriptional errors that result from this process are unintentional.

## 2024-08-23 ENCOUNTER — Other Ambulatory Visit: Payer: Self-pay

## 2024-08-23 ENCOUNTER — Ambulatory Visit (INDEPENDENT_AMBULATORY_CARE_PROVIDER_SITE_OTHER): Admitting: Family Medicine

## 2024-08-23 ENCOUNTER — Encounter: Payer: Self-pay | Admitting: Family Medicine

## 2024-08-23 VITALS — BP 112/78 | HR 68 | Ht 71.0 in | Wt 165.0 lb

## 2024-08-23 DIAGNOSIS — M79661 Pain in right lower leg: Secondary | ICD-10-CM

## 2024-08-23 DIAGNOSIS — E785 Hyperlipidemia, unspecified: Secondary | ICD-10-CM | POA: Diagnosis not present

## 2024-08-23 DIAGNOSIS — M7061 Trochanteric bursitis, right hip: Secondary | ICD-10-CM

## 2024-08-23 DIAGNOSIS — Z79899 Other long term (current) drug therapy: Secondary | ICD-10-CM | POA: Diagnosis not present

## 2024-08-23 DIAGNOSIS — M170 Bilateral primary osteoarthritis of knee: Secondary | ICD-10-CM

## 2024-08-23 DIAGNOSIS — Z955 Presence of coronary angioplasty implant and graft: Secondary | ICD-10-CM | POA: Diagnosis not present

## 2024-08-23 NOTE — Patient Instructions (Signed)
 Great to see you Know we are here for you See me after you win your race!

## 2024-08-23 NOTE — Assessment & Plan Note (Signed)
 Bilateral injections given.  Still being very fit.  Is running a significant amount as well as doing a triathlon at the end of November.  I do think patient will do extremely well and and we will give the injection.  Discussed with training still consider more nonweightbearing such as swimming and biking would be beneficial more than the running.

## 2024-08-23 NOTE — Assessment & Plan Note (Signed)
 New problem, has had on the contralateral side, think it is secondary to compensation for the knee on this side.  Discussed icing regimen and home exercises, increase activity slowly.  Discussed icing regimen.  Follow-up again in 6 to 8 weeks

## 2024-08-24 LAB — LIPID PANEL
Chol/HDL Ratio: 2.6 ratio (ref 0.0–5.0)
Cholesterol, Total: 172 mg/dL (ref 100–199)
HDL: 65 mg/dL (ref >39)
LDL Chol Calc (NIH): 90 mg/dL (ref 0–99)
Triglycerides: 94 mg/dL (ref 0–149)
VLDL Cholesterol Cal: 17 mg/dL (ref 5–40)

## 2024-08-24 LAB — ALT: ALT: 23 IU/L (ref 0–44)

## 2024-08-27 ENCOUNTER — Other Ambulatory Visit (HOSPITAL_BASED_OUTPATIENT_CLINIC_OR_DEPARTMENT_OTHER): Payer: Self-pay

## 2024-08-27 ENCOUNTER — Other Ambulatory Visit: Payer: Self-pay

## 2024-08-27 DIAGNOSIS — S86819S Strain of other muscle(s) and tendon(s) at lower leg level, unspecified leg, sequela: Secondary | ICD-10-CM | POA: Diagnosis not present

## 2024-08-27 DIAGNOSIS — S86819D Strain of other muscle(s) and tendon(s) at lower leg level, unspecified leg, subsequent encounter: Secondary | ICD-10-CM | POA: Diagnosis not present

## 2024-08-30 DIAGNOSIS — Z23 Encounter for immunization: Secondary | ICD-10-CM | POA: Diagnosis not present

## 2024-10-07 ENCOUNTER — Other Ambulatory Visit

## 2024-10-07 DIAGNOSIS — Z006 Encounter for examination for normal comparison and control in clinical research program: Secondary | ICD-10-CM

## 2024-10-12 NOTE — Progress Notes (Unsigned)
 [facility] [address] [place] [address] Phone: [phone] Subjective:   Paul [name], am serving as a scribe for Dr. [name].  I'm seeing this patient by the request  of:  [name], MD  CC: Bilateral knee, right hip pain, neck pain follow-up  YEP:Dlagzrupcz  08/23/2024 New problem, has had on the contralateral side, think it is secondary to compensation for the knee on this side.  Discussed icing regimen and home exercises, increase activity slowly.  Discussed icing regimen.  Follow-up again in 6 to 8 weeks     Bilateral injections given.  Still being very fit.  Is running a significant amount as well as doing a triathlon at the end of November.  I do think patient will do extremely well and and we will give the injection.  Discussed with training still consider more nonweightbearing such as swimming and biking would be beneficial more than the running.      Update 10/13/2024 Paul [name] is a 73 y.o. male coming in with complaint of B knee and R hip pain. Patient states that his calf and knee pain have improved but are still bothering him. Continues to do PT. Has point tender spot over medial R knee. Injection in medial R knee helped.   Had a race in [place] and got 3rd in the world.   Would like refill of Vit D. Riding on Saturday he developed quad pain 2 hours into his ride. Neck pain also increased during his ride. Has a lot of stiffness in cervical spine.   Shoulder has been feeling good in the pool.   Premature atrial contraction has not occurred since last visit.          Past Medical History:  Diagnosis Date   Actinic keratosis    Cataract    Depression    RESOLVED   Hyperlipidemia    Spinal stenosis    WAS HAVING NECK PAIN -PT STATES NERVE ABLATION PROCEDURE 2012 AND PAIN HAS RESOLVED   Spondylitis    Past Surgical History:  Procedure Laterality Date   APPENDECTOMY  1962   CORONARY PRESSURE/FFR  STUDY N/A 05/12/2023   Procedure: CORONARY PRESSURE/FFR STUDY;  Surgeon: Anner Alm ORN, MD;  Location[facility] INVASIVE CV LAB;  Service: Cardiovascular;  Laterality: N/A;   CORONARY STENT INTERVENTION N/A 05/12/2023   Procedure: CORONARY STENT INTERVENTION;  Surgeon: Anner Alm ORN, MD;  Location: [facility] INVASIVE CV LAB;  Service: Cardiovascular;  Laterality: N/A;   HERNIA REPAIR     INSERTION OF MESH  10/06/2012   Procedure: INSERTION OF MESH;  Surgeon: Donnice POUR. Belinda, MD;  Location: [facility] ORS;  Service: General;  Laterality: N/A;   KNEE SURGERY  1980 & 1982   left   LEFT HEART CATH AND CORONARY ANGIOGRAPHY N/A 05/12/2023   Procedure: LEFT HEART CATH AND CORONARY ANGIOGRAPHY;  Surgeon: Anner Alm ORN, MD;  Location: [facility] INVASIVE CV LAB;  Service: Cardiovascular;  Laterality: N/A;   TONSILLECTOMY  1968   UMBILICAL HERNIA REPAIR  10/06/2012   Procedure: HERNIA REPAIR UMBILICAL ADULT;  Surgeon: Donnice POUR. Belinda, MD;  Location: [facility];  Service: General;  Laterality: N/A;   Social History   Socioeconomic History   Marital status: Married    Spouse name: Not on file   Number of children: Not on file   Years of education: Not on file   Highest education level: Not on file  Occupational History   Not  on file  Tobacco Use   Smoking status: Never   Smokeless tobacco: Never  Vaping Use   Vaping status: Never Used  Substance and Sexual Activity   Alcohol use: Yes    Comment: OCCAS ALCOHOL   Drug use: No   Sexual activity: Not on file  Other Topics Concern   Not on file  Social History Narrative   ** Merged History Encounter **       Social Drivers of Health   Financial Resource Strain: Not on file  Food Insecurity: No Food Insecurity (05/10/2023)   Hunger Vital Sign    Worried About Running Out of Food in the Last Year: Never true    Ran Out of Food in the Last Year: Never true  Transportation Needs: No Transportation Needs (05/10/2023)   PRAPARE - Scientist, Research (physical Sciences) (Medical): No    Lack of Transportation (Non-Medical): No  Physical Activity: Not on file  Stress: Not on file  Social Connections: Not on file   Allergies  Allergen Reactions   Crestor  [Rosuvastatin ] Other (See Comments)    Myalgias  Pt currently taking 20mg  QD   Lipitor [Atorvastatin] Other (See Comments)    Liver toxicity   Family History  Problem Relation Age of Onset   Stroke Mother    Heart disease Father    Cancer Sister        bone     Current Outpatient Medications (Cardiovascular):    isosorbide  mononitrate (IMDUR ) 30 MG 24 hr tablet, Take 1 tablet (30 mg total) by mouth daily.   rosuvastatin  (CRESTOR ) 20 MG tablet, Take 1 tablet (20 mg total) by mouth daily.    Current Outpatient Medications (Hematological):    prasugrel  (EFFIENT ) 10 MG TABS tablet, Take 1 tablet (10 mg total) by mouth daily.  Current Outpatient Medications (Other):    Vitamin D , Ergocalciferol , (DRISDOL ) 1.25 MG (50000 UNIT) CAPS capsule, Take 1 capsule (50,000 Units total) by mouth every 7 (seven) days.   doxycycline  (VIBRA -TABS) 100 MG tablet, Take 1 tablet (100 mg total) by mouth 2 (two) times daily.   DULoxetine  (CYMBALTA ) 60 MG capsule, TAKE 1 CAPSULE BY MOUTH EVERY DAY   Reviewed prior external information including notes and imaging from  primary care provider As well as notes that were available from care everywhere and other healthcare systems.  Past medical history, social, surgical and family history all reviewed in electronic medical record.  No pertanent information unless stated regarding to the chief complaint.   Review of Systems:  No headache, visual changes, nausea, vomiting, diarrhea, constipation, dizziness, abdominal pain, skin rash, fevers, chills, night sweats, weight loss, swollen lymph nodes, body aches, joint swelling, chest pain, shortness of breath, mood changes. POSITIVE muscle aches  Objective  Blood pressure 112/66, pulse 83, height 5' 11  (1.803 m), weight 163 lb (73.9 kg), SpO2 96%.   General: No apparent distress alert and oriented x3 mood and affect normal, dressed appropriately.  HEENT: Pupils equal, extraocular movements intact  Respiratory: Patient's speak in full sentences and does not appear short of breath  Cardiovascular: No lower extremity edema, non tender, no erythema  Neck exam does have significant loss of lordosis noted.  Some limited sidebending bilaterally.  Left knee does have instability with valgus and varus force.  Tightness with full extension and full flexion.  Contralateral knee has some pain over the VMO but nothing severe.    Impression and Recommendations:    Degenerative disc disease, cervical  Significant arthritic changes noted.  Discussed with patient about the possibility of repeating an epidural with that being 7 months since the last 1.  We discussed icing regimen and home exercises, discussed which activities to do and which ones to avoid.  Increase activity slowly.  Follow-up again in 6 to 12 weeks    Decision today to treat with OMT was based on Physical Exam  After verbal consent patient was treated with HVLA, ME, FPR techniques in cervical, thoracic, rib, lumbar and sacral areas, all areas are chronic avoided HVLA on the cervical spine.  Patient tolerated the procedure well with improvement in symptoms  Patient given exercises, stretches and lifestyle modifications  See medications in patient instructions if given  Patient will follow up in 4-8 weeks  The above documentation has been reviewed and is accurate and complete Teghan Philbin M Trinidee Schrag, DO

## 2024-10-13 ENCOUNTER — Ambulatory Visit: Admitting: Family Medicine

## 2024-10-13 ENCOUNTER — Encounter: Payer: Self-pay | Admitting: Family Medicine

## 2024-10-13 VITALS — BP 112/66 | HR 83 | Ht 71.0 in | Wt 163.0 lb

## 2024-10-13 DIAGNOSIS — M1711 Unilateral primary osteoarthritis, right knee: Secondary | ICD-10-CM | POA: Diagnosis not present

## 2024-10-13 DIAGNOSIS — M9901 Segmental and somatic dysfunction of cervical region: Secondary | ICD-10-CM

## 2024-10-13 DIAGNOSIS — M9908 Segmental and somatic dysfunction of rib cage: Secondary | ICD-10-CM | POA: Diagnosis not present

## 2024-10-13 DIAGNOSIS — M5030 Other cervical disc degeneration, unspecified cervical region: Secondary | ICD-10-CM

## 2024-10-13 DIAGNOSIS — M9902 Segmental and somatic dysfunction of thoracic region: Secondary | ICD-10-CM | POA: Diagnosis not present

## 2024-10-13 DIAGNOSIS — M5412 Radiculopathy, cervical region: Secondary | ICD-10-CM

## 2024-10-13 DIAGNOSIS — M9904 Segmental and somatic dysfunction of sacral region: Secondary | ICD-10-CM | POA: Diagnosis not present

## 2024-10-13 DIAGNOSIS — M9903 Segmental and somatic dysfunction of lumbar region: Secondary | ICD-10-CM | POA: Diagnosis not present

## 2024-10-13 MED ORDER — VITAMIN D (ERGOCALCIFEROL) 1.25 MG (50000 UNIT) PO CAPS: 50000.0000 [IU] | ORAL_CAPSULE | ORAL | 0 refills | Status: DC

## 2024-10-13 NOTE — Assessment & Plan Note (Signed)
 Noted some patellofemoral arthritis and arthritic changes noted as well.  Will continue to monitor.

## 2024-10-13 NOTE — Assessment & Plan Note (Signed)
 Significant arthritic changes noted.  Discussed with patient about the possibility of repeating an epidural with that being 7 months since the last 1.  We discussed icing regimen and home exercises, discussed which activities to do and which ones to avoid.  Increase activity slowly.  Follow-up again in 6 to 12 weeks

## 2024-10-13 NOTE — Patient Instructions (Addendum)
 Epidural [phone] Vit D refilled R knee looks good Do aqua running Like vibration plate See me in 2 months

## 2024-10-18 ENCOUNTER — Telehealth (HOSPITAL_BASED_OUTPATIENT_CLINIC_OR_DEPARTMENT_OTHER): Payer: Self-pay

## 2024-10-18 LAB — GENECONNECT MOLECULAR SCREEN: Genetic Analysis Overall Interpretation: NEGATIVE

## 2024-10-18 NOTE — Telephone Encounter (Signed)
   Pre-operative Risk Assessment    Patient Name: Paul [name]  DOB: 1951/01/16 MRN: [id number]   Date of last office visit: 08/19/24 with Dr. Jeffrie Date of next office visit: NA  Request for Surgical Clearance    Procedure:  Cervical Epidural   Date of Surgery:  Clearance TBD                                 Surgeon:  NA Surgeon's Group or Practice Name:  [facility] Phone number:  [phone] Fax number:  [phone]   Type of Clearance Requested:   - Medical  - Pharmacy:  Hold Prasugrel  (Effient ) for 7 days   Type of Anesthesia:  Not Indicated   Additional requests/questions:    Paul [name]   10/18/2024, 3:07 PM

## 2024-10-18 NOTE — Telephone Encounter (Signed)
 Patient scheduled for pre-op clearance on 10/22/24 with Paul Bane, NP.     Patient Consent for Virtual Visit        Paul [name] has provided verbal consent on 10/18/2024 for a virtual visit (video or telephone).   CONSENT FOR VIRTUAL VISIT FOR:  Paul [name]  By participating in this virtual visit I agree to the following:  I hereby voluntarily request, consent and authorize [facility] and its employed or contracted physicians, physician assistants, nurse practitioners or other licensed health care professionals (the Practitioner), to provide me with telemedicine health care services (the "Services) as deemed necessary by the treating Practitioner. I acknowledge and consent to receive the Services by the Practitioner via telemedicine. I understand that the telemedicine visit will involve communicating with the Practitioner through live audiovisual communication technology and the disclosure of certain medical information by electronic transmission. I acknowledge that I have been given the opportunity to request an in-person assessment or other available alternative prior to the telemedicine visit and am voluntarily participating in the telemedicine visit.  I understand that I have the right to withhold or withdraw my consent to the use of telemedicine in the course of my care at any time, without affecting my right to future care or treatment, and that the Practitioner or I may terminate the telemedicine visit at any time. I understand that I have the right to inspect all information obtained and/or recorded in the course of the telemedicine visit and may receive copies of available information for a reasonable fee.  I understand that some of the potential risks of receiving the Services via telemedicine include:  Delay or interruption in medical evaluation due to technological equipment failure or disruption; Information transmitted may not be sufficient (e.g. poor  resolution of images) to allow for appropriate medical decision making by the Practitioner; and/or  In rare instances, security protocols could fail, causing a breach of personal health information.  Furthermore, I acknowledge that it is my responsibility to provide information about my medical history, conditions and care that is complete and accurate to the best of my ability. I acknowledge that Practitioner's advice, recommendations, and/or decision may be based on factors not within their control, such as incomplete or inaccurate data provided by me or distortions of diagnostic images or specimens that may result from electronic transmissions. I understand that the practice of medicine is not an exact science and that Practitioner makes no warranties or guarantees regarding treatment outcomes. I acknowledge that a copy of this consent can be made available to me via my patient portal [facility] MyChart), or I can request a printed copy by calling the office of [facility].    I understand that my insurance will be billed for this visit.   I have read or had this consent read to me. I understand the contents of this consent, which adequately explains the benefits and risks of the Services being provided via telemedicine.  I have been provided ample opportunity to ask questions regarding this consent and the Services and have had my questions answered to my satisfaction. I give my informed consent for the services to be provided through the use of telemedicine in my medical care

## 2024-10-18 NOTE — Telephone Encounter (Signed)
   Name: Paul [name]  DOB: 03/01/1951  MRN: [id number]  Primary Cardiologist: Oneil Parchment, MD   Preoperative team, please contact this patient and set up a phone call appointment for further preoperative risk assessment. Please obtain consent and complete medication review. Thank you for your help.  I confirm that guidance regarding antiplatelet and oral anticoagulation therapy has been completed and, if necessary, noted below.  Per office protocol, if patient is without any new symptoms or concerns at the time of their virtual visit, he may hold Effient  for 7 days prior to procedure. Please resume Effient  as soon as possible postprocedure, at the discretion of the surgeon.    I also confirmed the patient resides in the state of [place] . As per [facility] Medical Board telemedicine laws, the patient must reside in the state in which the provider is licensed.   Lamarr Satterfield, NP 10/18/2024, 3:14 PM [facility]

## 2024-10-22 ENCOUNTER — Ambulatory Visit: Attending: Cardiovascular Disease

## 2024-10-22 DIAGNOSIS — Z01810 Encounter for preprocedural cardiovascular examination: Secondary | ICD-10-CM | POA: Diagnosis not present

## 2024-10-22 NOTE — Progress Notes (Signed)
 Virtual Visit via Telephone Note   Because of Paul [name] co-morbid illnesses, he is at least at moderate risk for complications without adequate follow up.  This format is felt to be most appropriate for this patient at this time.  Due to technical limitations with video connection (technology), today's appointment will be conducted as an audio only telehealth visit, and TERUO STILLEY verbally agreed to proceed in this manner.   All issues noted in this document were discussed and addressed.  No physical exam could be performed with this format.  Evaluation Performed:  Preoperative cardiovascular risk assessment _____________   Date:  10/22/2024   Patient ID:  Paul [name], DOB 1951-05-06, MRN [id number] Patient Location:  Home Provider location:   Office  Primary Care Provider:  [name], MD Primary Cardiologist:  Oneil Parchment, MD  Chief Complaint / Patient Profile   73 y.o. y/o male with a h/o CAD with CTO of RCA, PCI/DES to prox LAD lesion 1, PCI/DES to dLAD lesion 2, hyperlipidemia, PACs who is pending cervical epidural injection and presents today for telephonic preoperative cardiovascular risk assessment.  History of Present Illness    Paul [name] is a 73 y.o. male who presents via audio/video conferencing for a telehealth visit today.  Pt was last seen in cardiology clinic on 08/19/24 by Dr. Parchment.  At that time Paul [name] was doing well.  The patient is now pending procedure as outlined above. Since his last visit, he denies chest pain, shortness of breath, lower extremity edema, fatigue, palpitations, melena, hematuria, hemoptysis, diaphoresis, weakness, presyncope, syncope, orthopnea, and PND. He completed an [employer] event in late November with no concerning cardiac symptoms. He is easily able to achieve > 4 METS activity.    Past Medical History    Past Medical History:  Diagnosis Date   Actinic keratosis    Cataract    Depression     RESOLVED   Hyperlipidemia    Spinal stenosis    WAS HAVING NECK PAIN -PT STATES NERVE ABLATION PROCEDURE 2012 AND PAIN HAS RESOLVED   Spondylitis    Past Surgical History:  Procedure Laterality Date   APPENDECTOMY  1962   CORONARY PRESSURE/FFR STUDY N/A 05/12/2023   Procedure: CORONARY PRESSURE/FFR STUDY;  Surgeon: Anner Alm ORN, MD;  Location: [facility] INVASIVE CV LAB;  Service: Cardiovascular;  Laterality: N/A;   CORONARY STENT INTERVENTION N/A 05/12/2023   Procedure: CORONARY STENT INTERVENTION;  Surgeon: Anner Alm ORN, MD;  Location: [facility] INVASIVE CV LAB;  Service: Cardiovascular;  Laterality: N/A;   HERNIA REPAIR     INSERTION OF MESH  10/06/2012   Procedure: INSERTION OF MESH;  Surgeon: Donnice POUR. Belinda, MD;  Location: [facility] ORS;  Service: General;  Laterality: N/A;   KNEE SURGERY  1980 & 1982   left   LEFT HEART CATH AND CORONARY ANGIOGRAPHY N/A 05/12/2023   Procedure: LEFT HEART CATH AND CORONARY ANGIOGRAPHY;  Surgeon: Anner Alm ORN, MD;  Location: [facility] INVASIVE CV LAB;  Service: Cardiovascular;  Laterality: N/A;   TONSILLECTOMY  1968   UMBILICAL HERNIA REPAIR  10/06/2012   Procedure: HERNIA REPAIR UMBILICAL ADULT;  Surgeon: Donnice POUR. Belinda, MD;  Location: [facility] ORS;  Service: General;  Laterality: N/A;    Allergies  Allergies  Allergen Reactions   Crestor  [Rosuvastatin ] Other (See Comments)    Myalgias  Pt currently taking 20mg  QD   Lipitor [Atorvastatin] Other (See Comments)    Liver toxicity    Home Medications  Prior to Admission medications   Medication Sig Start Date End Date Taking? Authorizing Provider  doxycycline  (VIBRA -TABS) 100 MG tablet Take 1 tablet (100 mg total) by mouth 2 (two) times daily. 05/12/24   Magdalen Pasco RAMAN, DPM  DULoxetine  (CYMBALTA ) 60 MG capsule TAKE 1 CAPSULE BY MOUTH EVERY DAY 06/14/24   Smith, Zachary M, DO  isosorbide  mononitrate (IMDUR ) 30 MG 24 hr tablet Take 1 tablet (30 mg total) by mouth daily. 08/19/24   Jeffrie Oneil BROCKS, MD  prasugrel   (EFFIENT ) 10 MG TABS tablet Take 1 tablet (10 mg total) by mouth daily. 08/19/24   Jeffrie Oneil BROCKS, MD  rosuvastatin  (CRESTOR ) 20 MG tablet Take 1 tablet (20 mg total) by mouth daily. 08/19/24   Jeffrie Oneil BROCKS, MD  Vitamin D , Ergocalciferol , (DRISDOL ) 1.25 MG (50000 UNIT) CAPS capsule Take 1 capsule (50,000 Units total) by mouth every 7 (seven) days. 10/13/24   Claudene Arthea HERO, DO    Physical Exam    Vital Signs:  ANDER WAMSER does not have vital signs available for review today.  Given telephonic nature of communication, physical exam is limited. AAOx3. NAD. Normal affect.  Speech and respirations are unlabored.  Accessory Clinical Findings    None  Assessment & Plan    1.  Preoperative Cardiovascular Risk Assessment: According to the Revised Cardiac Risk Index (RCRI), his Perioperative Risk of Major Cardiac Event is (%): 0.9. His Functional Capacity in METs is: 9.25 according to the Duke Activity Status Index (DASI). The patient is doing well from a cardiac perspective. Therefore, based on ACC/AHA guidelines, the patient would be at acceptable risk for the planned procedure without further cardiovascular testing.   The patient was advised that if he develops new symptoms prior to surgery to contact our office to arrange for a follow-up visit, and he verbalized understanding.  Per primary cardiologist, Dr, Jeffrie, he may hold Prasugrel  for 7 days prior to procedure and should resume as soon as hemodynamically stable postoperatively.  A copy of this note will be routed to requesting surgeon.  Time:   Today, I have spent 10 minutes with the patient with telehealth technology discussing medical history, symptoms, and management plan.     Rosaline EMERSON Bane, NP-C  10/22/2024, 1:42 PM [address] [place], [place] [address] Office [phone] Fax [phone]

## 2024-11-03 NOTE — Discharge Instructions (Signed)

## 2024-11-04 ENCOUNTER — Inpatient Hospital Stay: Admission: RE | Admit: 2024-11-04 | Discharge: 2024-11-04 | Attending: Family Medicine | Admitting: Family Medicine

## 2024-11-04 DIAGNOSIS — M5412 Radiculopathy, cervical region: Secondary | ICD-10-CM

## 2024-11-04 MED ORDER — IOPAMIDOL (ISOVUE-M 300) INJECTION 61%
1.0000 mL | Freq: Once | INTRAMUSCULAR | Status: AC | PRN
  Administered 2024-11-04: 09:00:00 1 mL via INTRATHECAL

## 2024-11-04 MED ORDER — TRIAMCINOLONE ACETONIDE 40 MG/ML IJ SUSP (RADIOLOGY)
60.0000 mg | Freq: Once | INTRAMUSCULAR | Status: AC
  Administered 2024-11-04: 09:00:00 60 mg via EPIDURAL

## 2024-12-05 ENCOUNTER — Other Ambulatory Visit: Payer: Self-pay | Admitting: Family Medicine

## 2024-12-06 ENCOUNTER — Other Ambulatory Visit (HOSPITAL_BASED_OUTPATIENT_CLINIC_OR_DEPARTMENT_OTHER): Payer: Self-pay

## 2024-12-07 NOTE — Progress Notes (Shared)
 " [facility] - Clinic Note  12/20/2024     CHIEF COMPLAINT Patient presents for No chief complaint on file.    HISTORY OF PRESENT ILLNESS: Paul [name] is a 74 y.o. male who presents to the clinic today for:    Pt states he had a heart attack in June last year, he is now on a blood thinner and aspirin   Referring physician: Helene Deacon, MD  HISTORICAL INFORMATION:   Selected notes from the MEDICAL RECORD NUMBER Referred by Dr. Deacon for retina eval and cataract clearance LEE:  Ocular Hx- PMH-    CURRENT MEDICATIONS: No current outpatient medications on file. (Ophthalmic Drugs)   No current facility-administered medications for this visit. (Ophthalmic Drugs)   Current Outpatient Medications (Other)  Medication Sig   doxycycline  (VIBRA -TABS) 100 MG tablet Take 1 tablet (100 mg total) by mouth 2 (two) times daily.   DULoxetine  (CYMBALTA ) 60 MG capsule TAKE 1 CAPSULE BY MOUTH EVERY DAY   isosorbide  mononitrate (IMDUR ) 30 MG 24 hr tablet Take 1 tablet (30 mg total) by mouth daily.   prasugrel  (EFFIENT ) 10 MG TABS tablet Take 1 tablet (10 mg total) by mouth daily.   rosuvastatin  (CRESTOR ) 20 MG tablet Take 1 tablet (20 mg total) by mouth daily.   Vitamin D , Ergocalciferol , (DRISDOL ) 1.25 MG (50000 UNIT) CAPS capsule TAKE 1 CAPSULE BY MOUTH EVERY 7 DAYS   No current facility-administered medications for this visit. (Other)   REVIEW OF SYSTEMS:    ALLERGIES Allergies  Allergen Reactions   Crestor  [Rosuvastatin ] Other (See Comments)    Myalgias  Pt currently taking 20mg  QD   Lipitor [Atorvastatin] Other (See Comments)    Liver toxicity   PAST MEDICAL HISTORY Past Medical History:  Diagnosis Date   Actinic keratosis    Cataract    Depression    RESOLVED   Hyperlipidemia    Spinal stenosis    WAS HAVING NECK PAIN -PT STATES NERVE ABLATION PROCEDURE 2012 AND PAIN HAS RESOLVED   Spondylitis    Past Surgical History:  Procedure  Laterality Date   APPENDECTOMY  1962   CORONARY PRESSURE/FFR STUDY N/A 05/12/2023   Procedure: CORONARY PRESSURE/FFR STUDY;  Surgeon: Anner Alm ORN, MD;  Location: [facility] INVASIVE CV LAB;  Service: Cardiovascular;  Laterality: N/A;   CORONARY STENT INTERVENTION N/A 05/12/2023   Procedure: CORONARY STENT INTERVENTION;  Surgeon: Anner Alm ORN, MD;  Location: [facility] INVASIVE CV LAB;  Service: Cardiovascular;  Laterality: N/A;   HERNIA REPAIR     INSERTION OF MESH  10/06/2012   Procedure: INSERTION OF MESH;  Surgeon: Donnice POUR. Belinda, MD;  Location: [facility] ORS;  Service: General;  Laterality: N/A;   KNEE SURGERY  1980 & 1982   left   LEFT HEART CATH AND CORONARY ANGIOGRAPHY N/A 05/12/2023   Procedure: LEFT HEART CATH AND CORONARY ANGIOGRAPHY;  Surgeon: Anner Alm ORN, MD;  Location: [facility] INVASIVE CV LAB;  Service: Cardiovascular;  Laterality: N/A;   TONSILLECTOMY  1968   UMBILICAL HERNIA REPAIR  10/06/2012   Procedure: HERNIA REPAIR UMBILICAL ADULT;  Surgeon: Donnice POUR. Belinda, MD;  Location: [facility] ORS;  Service: General;  Laterality: N/A;   FAMILY HISTORY Family History  Problem Relation Age of Onset   Stroke Mother    Heart disease Father    Cancer Sister        bone   SOCIAL HISTORY Social History   Tobacco Use   Smoking status: Never   Smokeless tobacco: Never  Vaping Use   Vaping status: Never Used  Substance Use Topics   Alcohol use: Yes    Comment: OCCAS ALCOHOL   Drug use: No       OPHTHALMIC EXAM:  Not recorded    IMAGING AND PROCEDURES  Imaging and Procedures for 12/20/2024           ASSESSMENT/PLAN:    ICD-10-CM   1. Epiretinal membrane (ERM) of left eye  H35.372     2. 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 Combined forms of age-related cataract of both eyes  H25.813       1. Epiretinal membrane, left eye - mild ERM -- stable - BCVA 20/20 OU - asymptomatic, no metamorphopsia - OCT with no significant change in ERM  - no indication for surgery at  this time - monitor - f/u 1 yr -- DFE/OCT -- color / FAF optos pics  2. Choroidal Nevus, OS  - 1.25 DD pigmented lesion SN to disc  - no visual symptoms, SRF or orange pigment  - no drusen  - flat, thickness < 2mm  - discussed findings, prognosis  - monitor   3. Pseudophakia OU  - s/p CE/IOL OU (Vivity lenses OU, Dr. Meridee, Nov. '22)  - IOLs in perfect position, doing well  - monitor  Ophthalmic Meds Ordered this visit:  No orders of the defined types were placed in this encounter.    No follow-ups on file.  There are no Patient Instructions on file for this visit.   Explained the diagnoses, plan, and follow up with the patient and they expressed understanding.  Patient expressed understanding of the importance of proper follow up care.   This document serves as a record of services personally performed by Redell JUDITHANN Hans, MD, PhD. It was created on their behalf by Paulina Jamse Gay an ophthalmic technician. The creation of this record is the provider's dictation and/or activities during the visit.   Electronically signed by: Paulina JONETTA Gay  12/07/24  10:59 AM   Redell JUDITHANN Hans, M.D., Ph.D. Diseases & Surgery of the Retina and Vitreous [facility]  Abbreviations: M myopia (nearsighted); A astigmatism; H hyperopia (farsighted); P presbyopia; Mrx spectacle prescription;  CTL contact lenses; OD right eye; OS left eye; OU both eyes  XT exotropia; ET esotropia; PEK punctate epithelial keratitis; PEE punctate epithelial erosions; DES dry eye syndrome; MGD meibomian gland dysfunction; ATs artificial tears; PFAT's preservative free artificial tears; NSC nuclear sclerotic cataract; PSC posterior subcapsular cataract; ERM epi-retinal membrane; PVD posterior vitreous detachment; RD retinal detachment; DM diabetes mellitus; DR diabetic retinopathy; NPDR non-proliferative diabetic retinopathy; PDR proliferative diabetic retinopathy; CSME clinically significant  macular edema; DME diabetic macular edema; dbh dot blot hemorrhages; CWS cotton wool spot; POAG primary open angle glaucoma; C/D cup-to-disc ratio; HVF humphrey visual field; GVF goldmann visual field; OCT optical coherence tomography; IOP intraocular pressure; BRVO Branch retinal vein occlusion; CRVO central retinal vein occlusion; CRAO central retinal artery occlusion; BRAO branch retinal artery occlusion; RT retinal tear; SB scleral buckle; PPV pars plana vitrectomy; VH Vitreous hemorrhage; PRP panretinal laser photocoagulation; IVK intravitreal kenalog ; VMT vitreomacular traction; MH Macular hole;  NVD neovascularization of the disc; NVE neovascularization elsewhere; AREDS age related eye disease study; ARMD age related macular degeneration; POAG primary open angle glaucoma; EBMD epithelial/anterior basement membrane dystrophy; ACIOL anterior chamber intraocular lens; IOL intraocular lens; PCIOL posterior chamber intraocular lens; Phaco/IOL phacoemulsification with intraocular lens placement; PRK photorefractive  keratectomy; LASIK laser assisted in situ keratomileusis; HTN hypertension; DM diabetes mellitus; COPD chronic obstructive pulmonary disease "

## 2024-12-09 NOTE — Progress Notes (Unsigned)
" °  [facility] [address] [place] [address] Phone: [phone] Subjective:    I'm seeing this patient by the request  of:  [name], MD  CC:   YEP:Dlagzrupcz  Paul [name] is a 74 y.o. male coming in with complaint of back and neck pain. OMT 10/13/2024. Also f/u for R knee pain. Patient states   Medications patient has been prescribed: Vit D  Taking:         Reviewed prior external information including notes and imaging from previsou exam, outside providers and external EMR if available.   As well as notes that were available from care everywhere and other healthcare systems.  Past medical history, social, surgical and family history all reviewed in electronic medical record.  No pertanent information unless stated regarding to the chief complaint.   Past Medical History:  Diagnosis Date   Actinic keratosis    Cataract    Depression    RESOLVED   Hyperlipidemia    Spinal stenosis    WAS HAVING NECK PAIN -PT STATES NERVE ABLATION PROCEDURE 2012 AND PAIN HAS RESOLVED   Spondylitis     Allergies[1]   Review of Systems:  No headache, visual changes, nausea, vomiting, diarrhea, constipation, dizziness, abdominal pain, skin rash, fevers, chills, night sweats, weight loss, swollen lymph nodes, body aches, joint swelling, chest pain, shortness of breath, mood changes. POSITIVE muscle aches  Objective  There were no vitals taken for this visit.   General: No apparent distress alert and oriented x3 mood and affect normal, dressed appropriately.  HEENT: Pupils equal, extraocular movements intact  Respiratory: Patient's speak in full sentences and does not appear short of breath  Cardiovascular: No lower extremity edema, non tender, no erythema  Gait MSK:  Back   Osteopathic findings  C2 flexed rotated and side bent right C6 flexed rotated and side bent left T3 extended rotated and side bent right inhaled rib T9  extended rotated and side bent left L2 flexed rotated and side bent right Sacrum right on right       Assessment and Plan:  No problem-specific Assessment & Plan notes found for this encounter.    Nonallopathic problems  Decision today to treat with OMT was based on Physical Exam  After verbal consent patient was treated with HVLA, ME, FPR techniques in cervical, rib, thoracic, lumbar, and sacral  areas  Patient tolerated the procedure well with improvement in symptoms  Patient given exercises, stretches and lifestyle modifications  See medications in patient instructions if given  Patient will follow up in 4-8 weeks             Note: This dictation was prepared with Dragon dictation along with smaller phrase technology. Any transcriptional errors that result from this process are unintentional.            [1]  Allergies Allergen Reactions   Crestor  Aeryn.alf ] Other (See Comments)    Myalgias  Pt currently taking 20mg  QD   Lipitor [Atorvastatin] Other (See Comments)    Liver toxicity   "

## 2024-12-13 ENCOUNTER — Ambulatory Visit: Admitting: Family Medicine

## 2024-12-20 ENCOUNTER — Encounter (INDEPENDENT_AMBULATORY_CARE_PROVIDER_SITE_OTHER): Payer: Medicare Other | Admitting: Ophthalmology

## 2024-12-20 ENCOUNTER — Encounter (INDEPENDENT_AMBULATORY_CARE_PROVIDER_SITE_OTHER): Payer: Self-pay

## 2024-12-20 DIAGNOSIS — H25813 Combined forms of age-related cataract, bilateral: Secondary | ICD-10-CM

## 2024-12-20 DIAGNOSIS — H35372 Puckering of macula, left eye: Secondary | ICD-10-CM

## 2024-12-20 DIAGNOSIS — D3132 Benign neoplasm of left choroid: Secondary | ICD-10-CM

## 2024-12-21 ENCOUNTER — Ambulatory Visit: Admitting: Dermatology

## 2024-12-21 DIAGNOSIS — L814 Other melanin hyperpigmentation: Secondary | ICD-10-CM | POA: Diagnosis not present

## 2024-12-21 DIAGNOSIS — D485 Neoplasm of uncertain behavior of skin: Secondary | ICD-10-CM

## 2024-12-21 DIAGNOSIS — W908XXA Exposure to other nonionizing radiation, initial encounter: Secondary | ICD-10-CM | POA: Diagnosis not present

## 2024-12-21 DIAGNOSIS — L570 Actinic keratosis: Secondary | ICD-10-CM

## 2024-12-21 DIAGNOSIS — L578 Other skin changes due to chronic exposure to nonionizing radiation: Secondary | ICD-10-CM | POA: Diagnosis not present

## 2024-12-21 DIAGNOSIS — D0422 Carcinoma in situ of skin of left ear and external auricular canal: Secondary | ICD-10-CM

## 2024-12-21 DIAGNOSIS — D1801 Hemangioma of skin and subcutaneous tissue: Secondary | ICD-10-CM | POA: Diagnosis not present

## 2024-12-21 DIAGNOSIS — L821 Other seborrheic keratosis: Secondary | ICD-10-CM

## 2024-12-21 DIAGNOSIS — Z1283 Encounter for screening for malignant neoplasm of skin: Secondary | ICD-10-CM | POA: Diagnosis not present

## 2024-12-21 DIAGNOSIS — D229 Melanocytic nevi, unspecified: Secondary | ICD-10-CM

## 2024-12-22 NOTE — Progress Notes (Unsigned)
" °  [facility] [address] [place] [address] Phone: [phone] Subjective:    I'm seeing this patient by the request  of:  [name], MD  CC:   YEP:Dlagzrupcz  Paul [name] is a 74 y.o. male coming in with complaint of back and neck pain. OMT 10/13/2024. Also f/u for R knee pain. Patient states   Medications patient has been prescribed: Vit D  Taking:         Reviewed prior external information including notes and imaging from previsou exam, outside providers and external EMR if available.   As well as notes that were available from care everywhere and other healthcare systems.  Past medical history, social, surgical and family history all reviewed in electronic medical record.  No pertanent information unless stated regarding to the chief complaint.   Past Medical History:  Diagnosis Date   Actinic keratosis    Cataract    Depression    RESOLVED   Hyperlipidemia    Spinal stenosis    WAS HAVING NECK PAIN -PT STATES NERVE ABLATION PROCEDURE 2012 AND PAIN HAS RESOLVED   Spondylitis     Allergies[1]   Review of Systems:  No headache, visual changes, nausea, vomiting, diarrhea, constipation, dizziness, abdominal pain, skin rash, fevers, chills, night sweats, weight loss, swollen lymph nodes, body aches, joint swelling, chest pain, shortness of breath, mood changes. POSITIVE muscle aches  Objective  There were no vitals taken for this visit.   General: No apparent distress alert and oriented x3 mood and affect normal, dressed appropriately.  HEENT: Pupils equal, extraocular movements intact  Respiratory: Patient's speak in full sentences and does not appear short of breath  Cardiovascular: No lower extremity edema, non tender, no erythema  Gait MSK:  Back   Osteopathic findings  C2 flexed rotated and side bent right C6 flexed rotated and side bent left T3 extended rotated and side bent right inhaled rib T9  extended rotated and side bent left L2 flexed rotated and side bent right Sacrum right on right       Assessment and Plan:  No problem-specific Assessment & Plan notes found for this encounter.    Nonallopathic problems  Decision today to treat with OMT was based on Physical Exam  After verbal consent patient was treated with HVLA, ME, FPR techniques in cervical, rib, thoracic, lumbar, and sacral  areas  Patient tolerated the procedure well with improvement in symptoms  Patient given exercises, stretches and lifestyle modifications  See medications in patient instructions if given  Patient will follow up in 4-8 weeks             Note: This dictation was prepared with Dragon dictation along with smaller phrase technology. Any transcriptional errors that result from this process are unintentional.            [1]  Allergies Allergen Reactions   Crestor  Aeryn.alf ] Other (See Comments)    Myalgias  Pt currently taking 20mg  QD   Lipitor [Atorvastatin] Other (See Comments)    Liver toxicity   "

## 2024-12-23 LAB — SURGICAL PATHOLOGY

## 2024-12-24 ENCOUNTER — Ambulatory Visit: Admitting: Family Medicine

## 2024-12-24 VITALS — BP 128/72 | HR 59 | Ht 71.0 in | Wt 175.0 lb

## 2024-12-24 DIAGNOSIS — I8391 Asymptomatic varicose veins of right lower extremity: Secondary | ICD-10-CM

## 2024-12-24 NOTE — Patient Instructions (Addendum)
 Injected both knees today Referral to vascular for varicose vein 2 cups of water in morning 1 cup after every meal No carbs after 6pm See me in 5-6 weeks

## 2025-01-17 ENCOUNTER — Ambulatory Visit: Admitting: Family Medicine

## 2025-06-21 ENCOUNTER — Ambulatory Visit: Admitting: Dermatology
